# Patient Record
Sex: Male | Born: 1955 | Race: Black or African American | Hispanic: No | Marital: Married | Smoking: Never smoker
Health system: Southern US, Community
[De-identification: ages and names within clinical notes are randomized; demographics above are authoritative.]

## PROBLEM LIST (undated history)

## (undated) DIAGNOSIS — G709 Myoneural disorder, unspecified: Secondary | ICD-10-CM

## (undated) DIAGNOSIS — K210 Gastro-esophageal reflux disease with esophagitis: Secondary | ICD-10-CM

## (undated) DIAGNOSIS — E119 Type 2 diabetes mellitus without complications: Secondary | ICD-10-CM

## (undated) DIAGNOSIS — M1990 Unspecified osteoarthritis, unspecified site: Secondary | ICD-10-CM

## (undated) DIAGNOSIS — E7800 Pure hypercholesterolemia, unspecified: Secondary | ICD-10-CM

## (undated) DIAGNOSIS — Z5189 Encounter for other specified aftercare: Secondary | ICD-10-CM

## (undated) DIAGNOSIS — G629 Polyneuropathy, unspecified: Secondary | ICD-10-CM

## (undated) DIAGNOSIS — E876 Hypokalemia: Secondary | ICD-10-CM

## (undated) DIAGNOSIS — D509 Iron deficiency anemia, unspecified: Secondary | ICD-10-CM

## (undated) DIAGNOSIS — N259 Disorder resulting from impaired renal tubular function, unspecified: Secondary | ICD-10-CM

## (undated) DIAGNOSIS — I10 Essential (primary) hypertension: Secondary | ICD-10-CM

## (undated) DIAGNOSIS — F1020 Alcohol dependence, uncomplicated: Secondary | ICD-10-CM

## (undated) DIAGNOSIS — K219 Gastro-esophageal reflux disease without esophagitis: Secondary | ICD-10-CM

## (undated) DIAGNOSIS — D72819 Decreased white blood cell count, unspecified: Secondary | ICD-10-CM

## (undated) DIAGNOSIS — E291 Testicular hypofunction: Secondary | ICD-10-CM

## (undated) HISTORY — DX: Unspecified osteoarthritis, unspecified site: M19.90

## (undated) HISTORY — DX: Iron deficiency anemia, unspecified: D50.9

## (undated) HISTORY — DX: Testicular hypofunction: E29.1

## (undated) HISTORY — DX: Decreased white blood cell count, unspecified: D72.819

## (undated) HISTORY — DX: Disorder resulting from impaired renal tubular function, unspecified: N25.9

## (undated) HISTORY — DX: Type 2 diabetes mellitus without complications: E11.9

## (undated) HISTORY — DX: Alcohol dependence, uncomplicated: F10.20

## (undated) HISTORY — DX: Pure hypercholesterolemia, unspecified: E78.00

## (undated) HISTORY — PX: APPENDECTOMY: SHX54

## (undated) HISTORY — DX: Polyneuropathy, unspecified: G62.9

## (undated) HISTORY — DX: Gastro-esophageal reflux disease without esophagitis: K21.9

## (undated) HISTORY — DX: Myoneural disorder, unspecified: G70.9

## (undated) HISTORY — DX: Encounter for other specified aftercare: Z51.89

## (undated) HISTORY — PX: UPPER GASTROINTESTINAL ENDOSCOPY: SHX188

## (undated) HISTORY — DX: Hypokalemia: E87.6

## (undated) HISTORY — PX: COLONOSCOPY: SHX174

## (undated) HISTORY — DX: Gastro-esophageal reflux disease with esophagitis: K21.0

## (undated) HISTORY — DX: Essential (primary) hypertension: I10

---

## 1998-05-26 ENCOUNTER — Encounter: Payer: Self-pay | Admitting: Emergency Medicine

## 1998-05-26 ENCOUNTER — Inpatient Hospital Stay (HOSPITAL_COMMUNITY): Admission: EM | Admit: 1998-05-26 | Discharge: 1998-05-29 | Payer: Self-pay | Admitting: Emergency Medicine

## 1998-05-28 ENCOUNTER — Encounter: Payer: Self-pay | Admitting: Gastroenterology

## 1999-01-19 ENCOUNTER — Ambulatory Visit (HOSPITAL_COMMUNITY): Admission: RE | Admit: 1999-01-19 | Discharge: 1999-01-19 | Payer: Self-pay | Admitting: Gastroenterology

## 2001-11-01 ENCOUNTER — Encounter: Admission: RE | Admit: 2001-11-01 | Discharge: 2001-11-01 | Payer: Self-pay | Admitting: Internal Medicine

## 2001-11-01 ENCOUNTER — Encounter: Payer: Self-pay | Admitting: Internal Medicine

## 2002-05-18 ENCOUNTER — Inpatient Hospital Stay (HOSPITAL_COMMUNITY): Admission: EM | Admit: 2002-05-18 | Discharge: 2002-05-21 | Payer: Self-pay | Admitting: Emergency Medicine

## 2002-07-18 ENCOUNTER — Inpatient Hospital Stay (HOSPITAL_COMMUNITY): Admission: AD | Admit: 2002-07-18 | Discharge: 2002-07-24 | Payer: Self-pay | Admitting: Internal Medicine

## 2002-07-19 ENCOUNTER — Encounter (INDEPENDENT_AMBULATORY_CARE_PROVIDER_SITE_OTHER): Payer: Self-pay | Admitting: *Deleted

## 2002-08-29 ENCOUNTER — Inpatient Hospital Stay (HOSPITAL_COMMUNITY): Admission: EM | Admit: 2002-08-29 | Discharge: 2002-08-31 | Payer: Self-pay | Admitting: Emergency Medicine

## 2002-08-29 ENCOUNTER — Encounter: Payer: Self-pay | Admitting: Emergency Medicine

## 2002-08-31 ENCOUNTER — Encounter (INDEPENDENT_AMBULATORY_CARE_PROVIDER_SITE_OTHER): Payer: Self-pay | Admitting: Specialist

## 2002-09-23 ENCOUNTER — Emergency Department (HOSPITAL_COMMUNITY): Admission: EM | Admit: 2002-09-23 | Discharge: 2002-09-23 | Payer: Self-pay | Admitting: Emergency Medicine

## 2002-11-01 ENCOUNTER — Encounter
Admission: RE | Admit: 2002-11-01 | Discharge: 2003-01-30 | Payer: Self-pay | Admitting: Physical Medicine & Rehabilitation

## 2003-03-12 ENCOUNTER — Encounter
Admission: RE | Admit: 2003-03-12 | Discharge: 2003-06-10 | Payer: Self-pay | Admitting: Physical Medicine & Rehabilitation

## 2003-12-21 ENCOUNTER — Emergency Department (HOSPITAL_COMMUNITY): Admission: EM | Admit: 2003-12-21 | Discharge: 2003-12-21 | Payer: Self-pay | Admitting: Emergency Medicine

## 2004-02-27 ENCOUNTER — Ambulatory Visit: Payer: Self-pay | Admitting: Endocrinology

## 2004-08-19 ENCOUNTER — Inpatient Hospital Stay (HOSPITAL_COMMUNITY): Admission: EM | Admit: 2004-08-19 | Discharge: 2004-08-21 | Payer: Self-pay | Admitting: Emergency Medicine

## 2004-08-19 ENCOUNTER — Ambulatory Visit: Payer: Self-pay | Admitting: Internal Medicine

## 2004-08-20 HISTORY — PX: ESOPHAGOGASTRODUODENOSCOPY: SHX1529

## 2004-08-21 HISTORY — PX: COLONOSCOPY: SHX174

## 2004-08-27 ENCOUNTER — Ambulatory Visit: Payer: Self-pay | Admitting: Endocrinology

## 2004-09-09 ENCOUNTER — Emergency Department (HOSPITAL_COMMUNITY): Admission: EM | Admit: 2004-09-09 | Discharge: 2004-09-10 | Payer: Self-pay | Admitting: Emergency Medicine

## 2004-09-10 ENCOUNTER — Ambulatory Visit: Payer: Self-pay | Admitting: Psychiatry

## 2004-09-10 ENCOUNTER — Inpatient Hospital Stay (HOSPITAL_COMMUNITY): Admission: EM | Admit: 2004-09-10 | Discharge: 2004-09-14 | Payer: Self-pay | Admitting: Psychiatry

## 2004-09-15 ENCOUNTER — Encounter: Admission: RE | Admit: 2004-09-15 | Discharge: 2004-12-14 | Payer: Self-pay | Admitting: Endocrinology

## 2004-09-16 ENCOUNTER — Ambulatory Visit: Payer: Self-pay | Admitting: Endocrinology

## 2004-09-18 ENCOUNTER — Ambulatory Visit: Payer: Self-pay | Admitting: Endocrinology

## 2004-11-24 ENCOUNTER — Ambulatory Visit: Payer: Self-pay | Admitting: Endocrinology

## 2004-11-27 ENCOUNTER — Ambulatory Visit: Payer: Self-pay | Admitting: Endocrinology

## 2005-01-07 ENCOUNTER — Encounter: Admission: RE | Admit: 2005-01-07 | Discharge: 2005-04-07 | Payer: Self-pay | Admitting: Endocrinology

## 2005-02-02 ENCOUNTER — Ambulatory Visit: Payer: Self-pay | Admitting: Endocrinology

## 2005-02-05 ENCOUNTER — Ambulatory Visit: Payer: Self-pay | Admitting: Endocrinology

## 2005-08-30 ENCOUNTER — Encounter: Admission: RE | Admit: 2005-08-30 | Discharge: 2005-08-30 | Payer: Self-pay | Admitting: Endocrinology

## 2005-10-06 ENCOUNTER — Ambulatory Visit: Payer: Self-pay | Admitting: Endocrinology

## 2005-10-11 ENCOUNTER — Ambulatory Visit: Payer: Self-pay | Admitting: Endocrinology

## 2006-02-17 ENCOUNTER — Encounter: Admission: RE | Admit: 2006-02-17 | Discharge: 2006-05-18 | Payer: Self-pay | Admitting: Endocrinology

## 2006-03-06 ENCOUNTER — Emergency Department (HOSPITAL_COMMUNITY): Admission: EM | Admit: 2006-03-06 | Discharge: 2006-03-07 | Payer: Self-pay | Admitting: Emergency Medicine

## 2006-03-08 ENCOUNTER — Ambulatory Visit: Payer: Self-pay | Admitting: Endocrinology

## 2006-03-08 ENCOUNTER — Inpatient Hospital Stay (HOSPITAL_COMMUNITY): Admission: EM | Admit: 2006-03-08 | Discharge: 2006-03-09 | Payer: Self-pay | Admitting: Endocrinology

## 2006-03-09 ENCOUNTER — Ambulatory Visit: Payer: Self-pay | Admitting: Internal Medicine

## 2006-03-11 ENCOUNTER — Ambulatory Visit: Payer: Self-pay | Admitting: Endocrinology

## 2006-07-06 ENCOUNTER — Ambulatory Visit: Payer: Self-pay | Admitting: Endocrinology

## 2006-09-29 ENCOUNTER — Encounter: Admission: RE | Admit: 2006-09-29 | Discharge: 2006-09-29 | Payer: Self-pay | Admitting: Endocrinology

## 2006-10-24 ENCOUNTER — Ambulatory Visit: Payer: Self-pay | Admitting: Endocrinology

## 2006-10-24 LAB — CONVERTED CEMR LAB
ALT: 23 units/L (ref 0–53)
Alkaline Phosphatase: 37 units/L — ABNORMAL LOW (ref 39–117)
BUN: 16 mg/dL (ref 6–23)
Basophils Absolute: 0 10*3/uL (ref 0.0–0.1)
Calcium: 9.3 mg/dL (ref 8.4–10.5)
Chloride: 103 meq/L (ref 96–112)
Creatinine, Ser: 1.2 mg/dL (ref 0.4–1.5)
Eosinophils Relative: 4.2 % (ref 0.0–5.0)
HCT: 40.9 % (ref 39.0–52.0)
Hemoglobin: 13.7 g/dL (ref 13.0–17.0)
Lymphocytes Relative: 36.7 % (ref 12.0–46.0)
MCHC: 33.5 g/dL (ref 30.0–36.0)
Monocytes Absolute: 0.5 10*3/uL (ref 0.2–0.7)
Neutro Abs: 1.9 10*3/uL (ref 1.4–7.7)
Platelets: 203 10*3/uL (ref 150–400)
RBC: 4.53 M/uL (ref 4.22–5.81)
RDW: 12.5 % (ref 11.5–14.6)
Sodium: 140 meq/L (ref 135–145)
Total Bilirubin: 0.7 mg/dL (ref 0.3–1.2)
Total Protein: 7 g/dL (ref 6.0–8.3)

## 2006-10-31 ENCOUNTER — Ambulatory Visit: Payer: Self-pay | Admitting: Endocrinology

## 2006-11-07 HISTORY — PX: ELECTROCARDIOGRAM: SHX264

## 2006-11-10 ENCOUNTER — Encounter: Payer: Self-pay | Admitting: Endocrinology

## 2006-11-10 DIAGNOSIS — N259 Disorder resulting from impaired renal tubular function, unspecified: Secondary | ICD-10-CM | POA: Insufficient documentation

## 2006-11-10 DIAGNOSIS — I10 Essential (primary) hypertension: Secondary | ICD-10-CM

## 2006-11-10 DIAGNOSIS — E119 Type 2 diabetes mellitus without complications: Secondary | ICD-10-CM

## 2006-11-10 HISTORY — DX: Disorder resulting from impaired renal tubular function, unspecified: N25.9

## 2006-11-10 HISTORY — DX: Essential (primary) hypertension: I10

## 2006-11-10 HISTORY — DX: Type 2 diabetes mellitus without complications: E11.9

## 2007-04-15 ENCOUNTER — Ambulatory Visit: Payer: Self-pay | Admitting: Internal Medicine

## 2007-04-15 ENCOUNTER — Inpatient Hospital Stay (HOSPITAL_COMMUNITY): Admission: EM | Admit: 2007-04-15 | Discharge: 2007-04-17 | Payer: Self-pay | Admitting: Emergency Medicine

## 2007-04-17 ENCOUNTER — Telehealth (INDEPENDENT_AMBULATORY_CARE_PROVIDER_SITE_OTHER): Payer: Self-pay | Admitting: *Deleted

## 2007-04-25 ENCOUNTER — Telehealth: Payer: Self-pay | Admitting: Endocrinology

## 2007-05-16 ENCOUNTER — Encounter: Payer: Self-pay | Admitting: Endocrinology

## 2007-07-17 ENCOUNTER — Telehealth (INDEPENDENT_AMBULATORY_CARE_PROVIDER_SITE_OTHER): Payer: Self-pay | Admitting: *Deleted

## 2007-08-03 ENCOUNTER — Encounter: Payer: Self-pay | Admitting: Endocrinology

## 2007-12-14 ENCOUNTER — Ambulatory Visit: Payer: Self-pay | Admitting: Endocrinology

## 2007-12-17 LAB — CONVERTED CEMR LAB
Alkaline Phosphatase: 36 units/L — ABNORMAL LOW (ref 39–117)
Basophils Relative: 0.6 % (ref 0.0–3.0)
Bilirubin, Direct: 0.1 mg/dL (ref 0.0–0.3)
Calcium: 9.6 mg/dL (ref 8.4–10.5)
Cholesterol: 105 mg/dL (ref 0–200)
Eosinophils Absolute: 0.2 10*3/uL (ref 0.0–0.7)
HCT: 37.8 % — ABNORMAL LOW (ref 39.0–52.0)
HDL: 49.9 mg/dL (ref >39.0)
Hemoglobin: 12.6 g/dL — ABNORMAL LOW (ref 13.0–17.0)
LDL Cholesterol: 42 mg/dL (ref 0–99)
Leukocytes, UA: NEGATIVE
Lymphocytes Relative: 35.7 % (ref 12.0–46.0)
MCV: 91.5 fL (ref 78.0–100.0)
Neutro Abs: 1.7 10*3/uL (ref 1.4–7.7)
Nitrite: NEGATIVE
Potassium: 4.2 meq/L (ref 3.5–5.1)
RBC: 4.13 M/uL — ABNORMAL LOW (ref 4.22–5.81)
Sodium: 140 meq/L (ref 135–145)
Total Bilirubin: 0.7 mg/dL (ref 0.3–1.2)
Total Protein: 7.5 g/dL (ref 6.0–8.3)
Urine Glucose: NEGATIVE mg/dL
VLDL: 13 mg/dL (ref 0–40)
WBC: 3.6 10*3/uL — ABNORMAL LOW (ref 4.5–10.5)
pH: 7 (ref 5.0–8.0)

## 2007-12-22 ENCOUNTER — Ambulatory Visit: Payer: Self-pay | Admitting: Endocrinology

## 2007-12-22 DIAGNOSIS — E291 Testicular hypofunction: Secondary | ICD-10-CM | POA: Insufficient documentation

## 2007-12-22 DIAGNOSIS — K2100 Gastro-esophageal reflux disease with esophagitis, without bleeding: Secondary | ICD-10-CM

## 2007-12-22 DIAGNOSIS — M79606 Pain in leg, unspecified: Secondary | ICD-10-CM | POA: Insufficient documentation

## 2007-12-22 DIAGNOSIS — F1020 Alcohol dependence, uncomplicated: Secondary | ICD-10-CM

## 2007-12-22 DIAGNOSIS — E7800 Pure hypercholesterolemia, unspecified: Secondary | ICD-10-CM | POA: Insufficient documentation

## 2007-12-22 HISTORY — DX: Testicular hypofunction: E29.1

## 2007-12-22 HISTORY — DX: Alcohol dependence, uncomplicated: F10.20

## 2007-12-22 HISTORY — DX: Gastro-esophageal reflux disease with esophagitis, without bleeding: K21.00

## 2007-12-22 HISTORY — DX: Pure hypercholesterolemia, unspecified: E78.00

## 2007-12-26 ENCOUNTER — Encounter: Payer: Self-pay | Admitting: Endocrinology

## 2007-12-26 DIAGNOSIS — D509 Iron deficiency anemia, unspecified: Secondary | ICD-10-CM

## 2007-12-26 HISTORY — DX: Iron deficiency anemia, unspecified: D50.9

## 2008-03-28 ENCOUNTER — Telehealth (INDEPENDENT_AMBULATORY_CARE_PROVIDER_SITE_OTHER): Payer: Self-pay | Admitting: *Deleted

## 2008-04-26 ENCOUNTER — Telehealth (INDEPENDENT_AMBULATORY_CARE_PROVIDER_SITE_OTHER): Payer: Self-pay | Admitting: *Deleted

## 2008-05-28 ENCOUNTER — Emergency Department (HOSPITAL_COMMUNITY): Admission: EM | Admit: 2008-05-28 | Discharge: 2008-05-28 | Payer: Self-pay | Admitting: Emergency Medicine

## 2008-05-28 ENCOUNTER — Ambulatory Visit: Payer: Self-pay | Admitting: *Deleted

## 2008-05-28 ENCOUNTER — Inpatient Hospital Stay (HOSPITAL_COMMUNITY): Admission: RE | Admit: 2008-05-28 | Discharge: 2008-05-31 | Payer: Self-pay | Admitting: *Deleted

## 2008-06-06 ENCOUNTER — Ambulatory Visit: Payer: Self-pay | Admitting: Endocrinology

## 2008-06-06 DIAGNOSIS — D61818 Other pancytopenia: Secondary | ICD-10-CM | POA: Insufficient documentation

## 2008-06-06 DIAGNOSIS — E876 Hypokalemia: Secondary | ICD-10-CM

## 2008-06-06 HISTORY — DX: Hypokalemia: E87.6

## 2008-06-06 LAB — CONVERTED CEMR LAB
BUN: 7 mg/dL (ref 6–23)
Calcium: 9.2 mg/dL (ref 8.4–10.5)
Chloride: 105 meq/L (ref 96–112)
Creatinine, Ser: 1.2 mg/dL (ref 0.4–1.5)
Eosinophils Absolute: 0.2 10*3/uL (ref 0.0–0.7)
GFR calc Af Amer: 81 mL/min
GFR calc non Af Amer: 67 mL/min
Hemoglobin: 11 g/dL — ABNORMAL LOW (ref 13.0–17.0)
Iron: 230 ug/dL — ABNORMAL HIGH (ref 42–165)
Lymphocytes Relative: 29.2 % (ref 12.0–46.0)
MCHC: 32.7 g/dL (ref 30.0–36.0)
Monocytes Absolute: 0.4 10*3/uL (ref 0.1–1.0)
Neutrophils Relative %: 54.3 % (ref 43.0–77.0)
RBC: 3.61 M/uL — ABNORMAL LOW (ref 4.22–5.81)
RDW: 14 % (ref 11.5–14.6)
Sodium: 144 meq/L (ref 135–145)

## 2008-06-14 ENCOUNTER — Encounter: Payer: Self-pay | Admitting: Endocrinology

## 2008-07-08 ENCOUNTER — Encounter: Payer: Self-pay | Admitting: Endocrinology

## 2008-09-02 ENCOUNTER — Encounter: Payer: Self-pay | Admitting: Endocrinology

## 2008-11-13 ENCOUNTER — Telehealth: Payer: Self-pay | Admitting: Endocrinology

## 2009-01-02 ENCOUNTER — Ambulatory Visit: Payer: Self-pay | Admitting: Endocrinology

## 2009-01-03 LAB — CONVERTED CEMR LAB
ALT: 25 units/L (ref 0–53)
AST: 18 units/L (ref 0–37)
Albumin: 4 g/dL (ref 3.5–5.2)
Alkaline Phosphatase: 45 units/L (ref 39–117)
Basophils Absolute: 0 10*3/uL (ref 0.0–0.1)
Basophils Relative: 0.5 % (ref 0.0–3.0)
Creatinine,U: 68.1 mg/dL
Eosinophils Absolute: 0.3 10*3/uL (ref 0.0–0.7)
Eosinophils Relative: 7.1 % — ABNORMAL HIGH (ref 0.0–5.0)
GFR calc non Af Amer: 73.71 mL/min (ref >60)
Ketones, ur: NEGATIVE mg/dL
Leukocytes, UA: NEGATIVE
Lymphocytes Relative: 40.6 % (ref 12.0–46.0)
MCHC: 31.8 g/dL (ref 30.0–36.0)
Microalb Creat Ratio: 2.9 mg/g (ref 0.0–30.0)
Microalb, Ur: 0.2 mg/dL (ref 0.0–1.9)
Monocytes Relative: 11.1 % (ref 3.0–12.0)
Neutrophils Relative %: 40.7 % — ABNORMAL LOW (ref 43.0–77.0)
Nitrite: NEGATIVE
PSA: 0.81 ng/mL (ref 0.10–4.00)
Platelets: 125 10*3/uL — ABNORMAL LOW (ref 150.0–400.0)
Potassium: 3.6 meq/L (ref 3.5–5.1)
RDW: 11.9 % (ref 11.5–14.6)
Specific Gravity, Urine: 1.01 (ref 1.000–1.030)
Total Protein: 7 g/dL (ref 6.0–8.3)
VLDL: 12.6 mg/dL (ref 0.0–40.0)
pH: 7.5 (ref 5.0–8.0)

## 2009-01-06 ENCOUNTER — Ambulatory Visit: Payer: Self-pay | Admitting: Endocrinology

## 2009-01-07 ENCOUNTER — Telehealth: Payer: Self-pay | Admitting: Endocrinology

## 2009-04-12 HISTORY — PX: REPLACEMENT TOTAL KNEE: SUR1224

## 2009-04-14 ENCOUNTER — Encounter: Payer: Self-pay | Admitting: Endocrinology

## 2009-05-09 ENCOUNTER — Encounter: Payer: Self-pay | Admitting: Endocrinology

## 2009-05-09 ENCOUNTER — Ambulatory Visit: Payer: Self-pay | Admitting: Internal Medicine

## 2009-05-09 LAB — CONVERTED CEMR LAB
Basophils Absolute: 0 10*3/uL (ref 0.0–0.1)
Eosinophils Absolute: 0.2 10*3/uL (ref 0.0–0.7)
HCT: 41.9 % (ref 39.0–52.0)
Hgb A1c MFr Bld: 7 % — ABNORMAL HIGH (ref 4.6–6.5)
LH: 8.96 milliintl units/mL (ref 1.50–9.30)
MCV: 93.3 fL (ref 78.0–100.0)
Neutro Abs: 1.6 10*3/uL (ref 1.4–7.7)
Platelets: 137 10*3/uL — ABNORMAL LOW (ref 150.0–400.0)
Prolactin: 7.8 ng/mL
RDW: 12 % (ref 11.5–14.6)
Saturation Ratios: 21.9 % (ref 20.0–50.0)
Transferrin: 195.5 mg/dL — ABNORMAL LOW (ref 212.0–360.0)

## 2009-05-13 ENCOUNTER — Telehealth (INDEPENDENT_AMBULATORY_CARE_PROVIDER_SITE_OTHER): Payer: Self-pay | Admitting: *Deleted

## 2009-05-21 ENCOUNTER — Encounter: Payer: Self-pay | Admitting: Endocrinology

## 2009-05-28 ENCOUNTER — Telehealth (INDEPENDENT_AMBULATORY_CARE_PROVIDER_SITE_OTHER): Payer: Self-pay | Admitting: *Deleted

## 2009-06-03 ENCOUNTER — Inpatient Hospital Stay (HOSPITAL_COMMUNITY): Admission: RE | Admit: 2009-06-03 | Discharge: 2009-06-06 | Payer: Self-pay | Admitting: Orthopedic Surgery

## 2009-06-03 ENCOUNTER — Other Ambulatory Visit: Payer: Self-pay | Admitting: Orthopedic Surgery

## 2009-06-26 ENCOUNTER — Telehealth: Payer: Self-pay | Admitting: Endocrinology

## 2009-08-28 ENCOUNTER — Telehealth: Payer: Self-pay | Admitting: Endocrinology

## 2009-09-22 ENCOUNTER — Encounter: Payer: Self-pay | Admitting: Endocrinology

## 2009-11-26 ENCOUNTER — Ambulatory Visit: Payer: Self-pay | Admitting: Endocrinology

## 2009-11-27 LAB — CONVERTED CEMR LAB
ALT: 26 units/L (ref 0–53)
AST: 22 units/L (ref 0–37)
Albumin: 4.3 g/dL (ref 3.5–5.2)
Alkaline Phosphatase: 43 units/L (ref 39–117)
Calcium: 9.7 mg/dL (ref 8.4–10.5)
Cholesterol: 121 mg/dL (ref 0–200)
Eosinophils Absolute: 0.2 10*3/uL (ref 0.0–0.7)
Eosinophils Relative: 5.7 % — ABNORMAL HIGH (ref 0.0–5.0)
GFR calc non Af Amer: 76.17 mL/min (ref >60)
Glucose, Bld: 103 mg/dL — ABNORMAL HIGH (ref 70–99)
HDL: 35.8 mg/dL — ABNORMAL LOW (ref >39.00)
Lymphocytes Relative: 45.2 % (ref 12.0–46.0)
MCV: 85.5 fL (ref 78.0–100.0)
Monocytes Absolute: 0.4 10*3/uL (ref 0.1–1.0)
Monocytes Relative: 11.1 % (ref 3.0–12.0)
Nitrite: NEGATIVE
PSA: 0.68 ng/mL (ref 0.10–4.00)
RDW: 14.9 % — ABNORMAL HIGH (ref 11.5–14.6)
Sodium: 141 meq/L (ref 135–145)
TSH: 1.21 microintl units/mL (ref 0.35–5.50)
Total Protein, Urine: NEGATIVE mg/dL
Triglycerides: 69 mg/dL (ref 0.0–149.0)
Urobilinogen, UA: 0.2 (ref 0.0–1.0)

## 2009-12-02 ENCOUNTER — Encounter: Payer: Self-pay | Admitting: Endocrinology

## 2009-12-02 ENCOUNTER — Ambulatory Visit: Payer: Self-pay | Admitting: Endocrinology

## 2010-01-15 ENCOUNTER — Emergency Department (HOSPITAL_COMMUNITY): Admission: EM | Admit: 2010-01-15 | Discharge: 2010-01-15 | Payer: Self-pay | Admitting: Emergency Medicine

## 2010-02-24 ENCOUNTER — Ambulatory Visit: Payer: Self-pay | Admitting: Endocrinology

## 2010-02-24 LAB — CONVERTED CEMR LAB: Hgb A1c MFr Bld: 7.3 % — ABNORMAL HIGH (ref 4.6–6.5)

## 2010-04-28 ENCOUNTER — Telehealth: Payer: Self-pay | Admitting: Endocrinology

## 2010-04-29 ENCOUNTER — Encounter: Payer: Self-pay | Admitting: Endocrinology

## 2010-04-29 ENCOUNTER — Telehealth (INDEPENDENT_AMBULATORY_CARE_PROVIDER_SITE_OTHER): Payer: Self-pay | Admitting: *Deleted

## 2010-05-10 LAB — CONVERTED CEMR LAB
Folate: >20 ng/mL
Folate: >20 ng/mL
Hgb A1c MFr Bld: 6.3 % — ABNORMAL HIGH (ref 4.6–6.0)
Iron: 53 ug/dL (ref 42–165)
Saturation Ratios: 14.4 % — ABNORMAL LOW (ref 20.0–50.0)
Testosterone: 173.96 ng/dL — ABNORMAL LOW (ref 350.00–890)
Transferrin: 262.2 mg/dL (ref >=212.0)
Vitamin B-12: 780 pg/mL (ref 211–911)

## 2010-05-12 ENCOUNTER — Telehealth: Payer: Self-pay | Admitting: Endocrinology

## 2010-05-12 NOTE — Letter (Signed)
Summary: Clearance/[facility]  Clearance/[facility]   Imported By: Lester Argyle 05/22/2009 10:23:47  _____________________________________________________________________  External Attachment:    Type:   Image     Comment:   External Document

## 2010-05-12 NOTE — Assessment & Plan Note (Signed)
Summary: PHYSICAL--STC   Vital Signs:  Patient profile:   55 year old male Height:      71 inches (180.34 cm) Weight:      300.13 pounds (136.42 kg) BMI:     42.01 O2 Sat:      98 % on Room air Temp:     97.5 degrees F (36.39 degrees C) oral Pulse rate:   76 / minute BP sitting:   126 / 80  (left arm) Cuff size:   large  Vitals Entered By: Brenton Grills MA (December 02, 2009 8:34 AM)  O2 Flow:  Room air CC: Physical/aj Is Patient Diabetic? Yes   CC:  Physical/aj.  History of Present Illness: here for regular wellness examination.  He's feeling pretty well in general, and does not drink or smoke.   Current Medications (verified): 1)  Neurontin 300 Mg  Caps (Gabapentin) .... Take 2 Tid 2)  Klor-Con M20 20 Meq  Tbcr (Potassium Chloride Crys Cr) .... Take 1 By Mouth Qd 3)  Trazodone Hcl 50 Mg  Tabs (Trazodone Hcl) .... Take 1 At Bedtime 4)  Fish Oil 1000 Mg  Caps (Omega-3 Fatty Acids) .... Take 3 By Mouth Qd 5)  Viagra 100 Mg  Tabs (Sildenafil Citrate) .... Use Prn 6)  Eql Omeprazole 20 Mg  Tbec (Omeprazole) .... Qd 7)  Omeprazole 20 Mg Cpdr (Omeprazole) .... Take 1 Capsule By Mouth Once A Day 8)  Zestoretic 10-12.5 Mg Tabs (Lisinopril-Hydrochlorothiazide) .... Qd 9)  Onetouch Ultra Test  Strp (Glucose Blood) .... Check Blood Sugars Two Times A Day 10)  Meloxicam 15 Mg Tabs (Meloxicam) .Marland Kitchen.. 1 Daily 11)  Hydrocodone/apap 325 Mg .Marland Kitchen.. 1 Tab By Mouth 2 Daily 12)  Simvastatin 40 Mg Tabs (Simvastatin) .Marland Kitchen.. 1 Qhs 13)  Metformin Hcl 500 Mg Xr24h-Tab (Metformin Hcl) .... 2 Tabs Two Times A Day  Allergies (verified): 1)  ! Actos (Pioglitazone Hcl)  Family History: Reviewed history from 12/22/2007 and no changes required. mother had colon cancer  Social History: Reviewed history from 12/22/2007 and no changes required. married works for [employer] stopped alcohol jan 2010 nonsmoker   Review of Systems  The patient denies fever, weight loss, weight gain, vision loss,  decreased hearing, chest pain, syncope, dyspnea on exertion, prolonged cough, headaches, abdominal pain, melena, hematochezia, severe indigestion/heartburn, hematuria, suspicious skin lesions, and depression.    Physical Exam  General:  normal appearance.   Head:  head: no deformity eyes: no periorbital swelling, no proptosis external nose and ears are normal mouth: no lesion seen Neck:  Supple without thyroid enlargement or tenderness.  Lungs:  Clear to auscultation bilaterally. Normal respiratory effort.  Heart:  Regular rate and rhythm without murmurs or gallops noted. Normal S1,S2.   Abdomen:  abdomen is soft, nontender.  no hepatosplenomegaly.   not distended.  no hernia  Rectal:  normal external and internal exam.  heme neg  Prostate:  Normal size prostate without masses or tenderness.  Msk:  there is a scar (tkr) at the right anterior knee Pulses:  dorsalis pedis intact bilat.  no carotid bruit Neurologic:  cn 2-12 grossly intact.   readily moves all 4's.   sensation is intact to touch on the feet  Skin:  normal texture and temp.  no rash.  not diaphoretic  Cervical Nodes:  No significant adenopathy.  Psych:  Alert and cooperative; normal mood and affect; normal attention span and concentration.   Additional Exam:  SEPARATE EVALUATION FOLLOWS--EACH PROBLEM HERE IS NEW,  NOT RESPONDING TO TREATMENT, OR POSES SIGNIFICANT RISK TO THE PATIENT'S HEALTH: HISTORY OF THE PRESENT ILLNESS: pt went-off his testosterone cream no cbg record, but states cbg's are well-controlled PAST MEDICAL HISTORY reviewed and up to date today REVIEW OF SYSTEMS: denies hypoglycemia PHYSICAL EXAMINATION: muscle bulk and strength are grossly normal.  no obvious joint swelling.  gait is normal and steady breasts:  there is bilat pseudogynecomastia no deformity.  no ulcer on the feet.  feet are of normal color and temp.  there is 1+ bilat edema. there is bilat onychomycosis LAB/XRAY RESULTS: Hemoglobin  A1C       [H]  7.6 %  testosterone=166 IMPRESSION: dm, needs increased rx hypogonadism, needs increased rx PLAN: see instruction sheet   Impression & Recommendations:  Problem # 1:  ROUTINE GENERAL MEDICAL EXAM@HEALTH  CARE FACL (ICD-V70.0)  Medications Added to Medication List This Visit: 1)  Testim 1 % Gel (Testosterone) .... 5 grams once daily 2)  Bromocriptine Mesylate 2.5 Mg Tabs (Bromocriptine mesylate) .... 1/2 tab at bedtime  Other Orders: EKG w/ Interpretation (93000) Est. Patient Level III (86578) Est. Patient 40-64 years (46962)  Patient Instructions: 1)  resume testim 5 grams once daily 2)  good diet and exercise habits significanly improve the control of your diabetes.  please let me know if you wish to be referred to a dietician.  high blood sugar is very risky to your health.  you should see an eye doctor every year. 3)  controlling your blood pressure and cholesterol drastically reduces the damage diabetes does to your body.  this also applies to quitting smoking.  please discuss these with your doctor.  you should take an aspirin every day, unless you have been advised by a doctor not to. 4)  add bromocriptine 1/2 of 2.5 mg at bedtime. 5)  please consider these measures for your health:  minimize alcohol.  do not use tobacco products.  have a colonoscopy at least every 10 years from age 17.  keep firearms safely stored.  always use seat belts.  have working smoke alarms in your home.  see an eye doctor and dentist regularly.  never drive under the influence of alcohol or drugs (including prescription drugs). 6)  check your blood sugar 1 time a day.  vary the time of day when you check, between before the 3 meals, and at bedtime.  also check if you have symptoms of your blood sugar being too high or too low.  please keep a record of the readings and bring it to your next appointment here.  please call us sooner if you are having low blood sugar episodes. 7)  Please  schedule a follow-up appointment in 3 months. Prescriptions: BROMOCRIPTINE MESYLATE 2.5 MG TABS (BROMOCRIPTINE MESYLATE) 1/2 tab at bedtime  #45 x 3   Entered and Authorized by:   Minus Breeding MD   Signed by:   Minus Breeding MD on 12/02/2009   Method used:   Electronically to        [facility] [address] ([facility]order)       [facility]       [address]       [place], [place]  [address]       Ph: [phone]       Fax: [phone]   RxID:   [id number] VIAGRA 100 MG  TABS (SILDENAFIL CITRATE) use prn  #18 x 3   Entered and Authorized by:   Minus Breeding MD  Signed by:   Minus Breeding MD on 12/02/2009   Method used:   Print then Give to Patient   RxID:   [id number] TESTIM 1 % GEL (TESTOSTERONE) 5 grams once daily  #90 doses x 1   Entered and Authorized by:   Minus Breeding MD   Signed by:   Minus Breeding MD on 12/02/2009   Method used:   Print then Give to Patient   RxID:   [id number]

## 2010-05-12 NOTE — Letter (Signed)
Summary: Dustin Folks Bernstorf OD  Dustin Folks Bernstorf OD   Imported By: Lester Berthold 10/02/2009 10:04:36  _____________________________________________________________________  External Attachment:    Type:   Image     Comment:   External Document

## 2010-05-12 NOTE — Progress Notes (Signed)
Summary: Rx change  Phone Note Call from Patient Call back at Home Phone [phone]   Caller: Patient Summary of Call: pt called stating that Metformin is causing CBG to drop to quickly. Pt is requesting prescription be changed to Metformin ER.  [facility] Initial call taken by: Margaret Pyle, CMA,  Aug 28, 2009 9:57 AM  Follow-up for Phone Call        i sent rx  Follow-up by: Minus Breeding MD,  Aug 28, 2009 10:34 AM  Additional Follow-up for Phone Call Additional follow up Details #1::        pt informed, Rx [facility] Additional Follow-up by: Margaret Pyle, CMA,  Aug 28, 2009 10:40 AM    New/Updated Medications: METFORMIN HCL 500 MG XR24H-TAB (METFORMIN HCL) 2 tabs two times a day Prescriptions: METFORMIN HCL 500 MG XR24H-TAB (METFORMIN HCL) 2 tabs two times a day  #360 x 3   Entered by:   Margaret Pyle, CMA   Authorized by:   Minus Breeding MD   Signed by:   Margaret Pyle, CMA on 08/28/2009   Method used:   Electronically to        [facility] [address] (mail-order)       [facility]       [address]       [place], [place]  [address]       Ph: [phone]       Fax: [phone]   RxID:   [id number] METFORMIN HCL 500 MG XR24H-TAB (METFORMIN HCL) 2 tabs two times a day  #120 x 11   Entered and Authorized by:   Minus Breeding MD   Signed by:   Minus Breeding MD on 08/28/2009   Method used:   Electronically to        [facility]* (retail)       [address] [place], [place]  [address]       Ph: [phone]       Fax: [phone]   RxID:   [id number]

## 2010-05-12 NOTE — Progress Notes (Signed)
  Phone Note From Other Clinic   Caller: Wl Pre-surgical Details for Reason: Pt.Information Initial call taken by: Km    Faxed 12 lead over to [facility] @ fax [phone] [facility]  May 28, 2009 9:37 AM

## 2010-05-12 NOTE — Letter (Signed)
Summary: [facility]  [facility]   Imported By: Sherian Rein 04/22/2009 11:51:52  _____________________________________________________________________  External Attachment:    Type:   Image     Comment:   External Document

## 2010-05-12 NOTE — Progress Notes (Signed)
Summary: RX refill  Phone Note Call from Patient Call back at Home Phone [phone]   Caller: Patient Summary of Call: pt called requesting refills of his medication to [facility] Initial call taken by: Margaret Pyle, CMA,  June 26, 2009 10:51 AM    Prescriptions: OMEPRAZOLE 20 MG CPDR (OMEPRAZOLE) Take 1 capsule by mouth once a day  #90 x 3   Entered by:   Margaret Pyle, CMA   Authorized by:   Minus Breeding MD   Signed by:   Margaret Pyle, CMA on 06/26/2009   Method used:   Electronically to        [facility] [address] (mail-order)       [facility]       [address]       [place], [place]  [address]       Ph: [phone]       Fax: [phone]   RxID:   [id number] ZESTORETIC 10-12.5 MG TABS (LISINOPRIL-HYDROCHLOROTHIAZIDE) qd  #90 x 3   Entered by:   Margaret Pyle, CMA   Authorized by:   Minus Breeding MD   Signed by:   Margaret Pyle, CMA on 06/26/2009   Method used:   Electronically to        [facility] [address] (mail-order)       [facility]       [address]       [place], [place]  [address]       Ph: [phone]       Fax: [phone]   RxID:   [id number] SIMVASTATIN 40 MG TABS (SIMVASTATIN) 1 qhs  #90 x 3   Entered by:   Margaret Pyle, CMA   Authorized by:   Minus Breeding MD   Signed by:   Margaret Pyle, CMA on 06/26/2009   Method used:   Electronically to        [facility] (mail-order)       [facility]       [address]       [place], [place]  [address]       Ph: [phone]       Fax: [phone]   RxID:   [id number] TRAZODONE HCL 50 MG  TABS (TRAZODONE HCL) take 1 at bedtime  #90 x 3   Entered by:   Margaret Pyle, CMA   Authorized by:   Minus Breeding MD   Signed by:   Margaret Pyle, CMA on 06/26/2009   Method used:   Electronically to   [facility] (mail-order)       [facility]       [address]       [place], [place]  [address]       Ph: [phone]       Fax: [phone]   RxID:   [id number] KLOR-CON M20 20 MEQ  TBCR (POTASSIUM CHLORIDE CRYS CR) take 1 by mouth qd  #90 x 3   Entered by:   Margaret Pyle, CMA   Authorized by:   Minus Breeding MD   Signed by:   Margaret Pyle, CMA on 06/26/2009   Method used:   Electronically to        [facility] (mail-order)       [facility]       [address]       [place]  [address]       Ph: [phone]       Fax: [phone]   RxID:   [id number]

## 2010-05-12 NOTE — Assessment & Plan Note (Signed)
Summary: NEEDS SURGICAL CLEARANCE / DR George Hugh PT /NWS--rs'd/cd   Vital Signs:  Patient profile:   55 year old male Height:      71 inches (180.34 cm) Weight:      296.0 pounds (134.55 kg) O2 Sat:      97 % on Room air Temp:     98.6 degrees F (37.00 degrees C) oral Pulse rate:   85 / minute BP sitting:   112 / 76  (left arm) Cuff size:   large  Vitals Entered By: Orlan Leavens (May 09, 2009 3:17 PM)  O2 Flow:  Room air CC: medical clearance for knee surgery Is Patient Diabetic? Yes Did you bring your meter with you today? No Pain Assessment Patient in pain? no        CC:  medical clearance for knee surgery.  History of Present Illness: pt will undergo right knee replacement 06/03/09.   he takes metformin as rx'ed. pt states chronic constipation, probably due to iron and vicodin. pt has been off testosterone x 4 mos. he takes zesoretic as rx'ed.  Current Medications (verified): 1)  Neurontin 300 Mg  Caps (Gabapentin) .... Take 2 Tid 2)  Klor-Con M20 20 Meq  Tbcr (Potassium Chloride Crys Cr) .... Take 1 By Mouth Qd 3)  Trazodone Hcl 50 Mg  Tabs (Trazodone Hcl) .... Take 1 At Bedtime 4)  Fish Oil 1000 Mg  Caps (Omega-3 Fatty Acids) .... Take 3 By Mouth Qd 5)  Androgel 25 Mg/2.5gm  Gel (Testosterone) .... Take 5 Grams Qd 6)  Viagra 100 Mg  Tabs (Sildenafil Citrate) .... Use Prn 7)  Eql Omeprazole 20 Mg  Tbec (Omeprazole) .... Qd 8)  Omeprazole 20 Mg Cpdr (Omeprazole) .... Take 1 Capsule By Mouth Once A Day 9)  Zestoretic 10-12.5 Mg Tabs (Lisinopril-Hydrochlorothiazide) .... Qd 10)  Onetouch Ultra Test  Strp (Glucose Blood) .... Check Blood Sugars Two Times A Day 11)  Meloxicam 15 Mg Tabs (Meloxicam) .Marland Kitchen.. 1 Daily 12)  Hydrocodone/apap 325 Mg .Marland Kitchen.. 1 Tab By Mouth 2 Daily 13)  Simvastatin 40 Mg Tabs (Simvastatin) .Marland Kitchen.. 1 Qhs 14)  Metformin Hcl 500 Mg Xr24h-Tab (Metformin Hcl) .... 4 Qam  Allergies (verified): No Known Drug Allergies  Review of Systems  The patient  denies fever, chest pain, dyspnea on exertion, hematochezia, and hematuria.         denies n/v  Physical Exam  General:  normal appearance.   Lungs:  Clear to auscultation bilaterally. Normal respiratory effort.  Heart:  Regular rate and rhythm without murmurs or gallops noted. Normal S1,S2.   Additional Exam:  ecg is normal today.  Hemoglobin A1C       [H]  7.0 %                       4.6-6.5   White Cell Count     [L]  4.3 K/uL                    4.5-10.5   Red Cell Count            4.49 Mil/uL                 4.22-5.81   Hemoglobin                13.5 g/dL                   16.1-09.6   Hematocrit  41.9 %                      39.0-52.0   Platelet Count       [L]  137.0 K/uL   Iron Saturation           21.9 %                      20.0-50.0   Testosterone         [L]  140.72 ng/dL                846.96-295.28  Leutinizing Hormone       8.96 mIU/mL                 1.50-9.30   FSH                  [H]  23.5 mIU/ML                 1.4-18.1  Prolactin                 7.8 ng/mL   Impression & Recommendations:  Problem # 1:  HYPERTENSION (ICD-401.9) well-controlled  Problem # 2:  constipation prob due to iron and narcotics  Problem # 3:  HYPOGONADISM (ICD-257.2) primary.  uncertain etiology  Problem # 4:  DIABETES MELLITUS, TYPE II (ICD-250.00) well-controlled  Problem # 5:  PANCYTOPENIA (ICD-284.1) Assessment: Improved  Medications Added to Medication List This Visit: 1)  Metformin Hcl 500 Mg Tabs (Metformin hcl) .... 2 tabs bid  Other Orders: EKG w/ Interpretation (93000) TLB-A1C / Hgb A1C (Glycohemoglobin) (83036-A1C) TLB-CBC Platelet - w/Differential (85025-CBCD) TLB-IBC Pnl (Iron/FE;Transferrin) (83550-IBC) TLB-Testosterone, Total (84403-TESTO) TLB-Luteinizing Hormone (LH) (83002-LH) TLB-FSH (Follicle Stimulating Hormone) (83001-FSH) TLB-Prolactin (84146-PROL) Est. Patient Level IV (41324)  Patient Instructions: 1)  change metformin to non-xr 2x500  mg two times a day (to help constipation). 2)  tests are being ordered for you today.  a few days after the test(s), please call [phone] to hear your test results. 3)  cc dr [facility] Filter, [facility] 4)  (update: i left message on phone-tree:  i offered androgel). 5)  his surgical risk is low and outweighed by the potential benefit of the surgery.  he is therefore medically cleared. Prescriptions: METFORMIN HCL 500 MG TABS (METFORMIN HCL) 2 tabs bid  #360 x 3   Entered and Authorized by:   Minus Breeding MD   Signed by:   Minus Breeding MD on 05/09/2009   Method used:   Electronically to        [facility] [address] (mail[facility]order)       [facility]       [address]       [place], [place]  [address]       Ph: [phone]       Fax: [phone]   RxID:   [id number] METFORMIN HCL 500 MG TABS (METFORMIN HCL) 2 tabs bid  #360 x 3   Entered and Authorized by:   Minus Breeding MD   Signed by:   Minus Breeding MD on 05/09/2009   Method used:   Print then Give to Patient   RxID:   [id number]

## 2010-05-12 NOTE — Progress Notes (Signed)
  Phone Note Call from Patient   Summary of Call: Pt called requesting phone tree id number. Called and gave pt the MRN #. Initial call taken by: Josph Macho CMA,  May 13, 2009 4:34 PM

## 2010-05-12 NOTE — Assessment & Plan Note (Signed)
Summary: 3 mth fu---stc   Vital Signs:  Patient profile:   55 year old male Height:      71 inches (180.34 cm) Weight:      299 pounds (135.91 kg) BMI:     41.85 O2 Sat:      97 % on Room air Temp:     98.2 degrees F (36.78 degrees C) oral Pulse rate:   84 / minute BP sitting:   120 / 82  (left arm) Cuff size:   large  Vitals Entered By: Brenton Grills CMA Duncan Dull) (February 24, 2010 8:54 AM)  O2 Flow:  Room air CC: 3 month F/U/aj Is Patient Diabetic? Yes   CC:  3 month F/U/aj.  History of Present Illness: pt says he feels no different on the testim no cbg record, but states cbg's are well-controlled.  denies n/v  Current Medications (verified): 1)  Neurontin 300 Mg  Caps (Gabapentin) .... Take 2 Tid 2)  Klor-Con M20 20 Meq  Tbcr (Potassium Chloride Crys Cr) .... Take 1 By Mouth Qd 3)  Trazodone Hcl 50 Mg  Tabs (Trazodone Hcl) .... Take 1 At Bedtime 4)  Fish Oil 1000 Mg  Caps (Omega-3 Fatty Acids) .... Take 3 By Mouth Qd 5)  Viagra 100 Mg  Tabs (Sildenafil Citrate) .... Use Prn 6)  Eql Omeprazole 20 Mg  Tbec (Omeprazole) .... Qd 7)  Omeprazole 20 Mg Cpdr (Omeprazole) .... Take 1 Capsule By Mouth Once A Day 8)  Zestoretic 10-12.5 Mg Tabs (Lisinopril-Hydrochlorothiazide) .... Qd 9)  Onetouch Ultra Test  Strp (Glucose Blood) .... Check Blood Sugars Two Times A Day 10)  Meloxicam 15 Mg Tabs (Meloxicam) .Marland Kitchen.. 1 Daily 11)  Hydrocodone/apap 325 Mg .Marland Kitchen.. 1 Tab By Mouth 2 Daily 12)  Simvastatin 40 Mg Tabs (Simvastatin) .Marland Kitchen.. 1 Qhs 13)  Metformin Hcl 500 Mg Xr24h-Tab (Metformin Hcl) .... 2 Tabs Two Times A Day 14)  Testim 1 % Gel (Testosterone) .... 5 Grams Once Daily 15)  Bromocriptine Mesylate 2.5 Mg Tabs (Bromocriptine Mesylate) .... 1/2 Tab At Bedtime  Allergies (verified): 1)  ! Actos (Pioglitazone Hcl)  Past History:  Past Medical History: Last updated: 12/22/2007 Diabetes mellitus, type II Hypertension Leukopenia Alcoholism Dyslipidema Chronic Neuropathy leg  pain primary Hypogonadism, prob. due 2 ETOH Renal insufficiency Esohagitis  Review of Systems       denies dizziness  Physical Exam  General:  normal appearance.   Genitalia:  Normal external male genitalia with no urethral discharge.  Additional Exam:  Hemoglobin A1C       [H]  7.3 %                       4.6-6.5 Testosterone         [L]  152.19 ng/dL       Impression & Recommendations:  Problem # 1:  DIABETES MELLITUS, TYPE II (ICD-250.00) needs increased rx  Problem # 2:  HYPOGONADISM (ICD-257.2) needs increased rx  Medications Added to Medication List This Visit: 1)  Bromocriptine Mesylate 2.5 Mg Tabs (Bromocriptine mesylate) .Marland Kitchen.. 1 tab at bedtime  Other Orders: TLB-A1C / Hgb A1C (Glycohemoglobin) (83036-A1C) TLB-Testosterone, Total (84403-TESTO) Est. Patient Level III ([address])  Patient Instructions: 1)  blood tests are being ordered for you today.  please call [phone] to hear your test results. 2)  pending the test results, please continue the same medications for now. 3)  increase bromocriptine to 2.5 mg at bedtime. 4)  check your blood sugar 1  time a day.  vary the time of day when you check, between before the 3 meals, and at bedtime.  also check if you have symptoms of your blood sugar being too high or too low.  please keep a record of the readings and bring it to your next appointment here.  please call us sooner if you are having low blood sugar episodes. 5)  Please schedule a follow-up appointment in 3 months. 6)  (update: i left message on phone-tree:  increase testim to 10 g once daily.  call when you need refill). Prescriptions: VIAGRA 100 MG  TABS (SILDENAFIL CITRATE) use prn  #18 x 3   Entered and Authorized by:   Minus Breeding MD   Signed by:   Minus Breeding MD on 02/24/2010   Method used:   Faxed to ...       [facility]       [address] [address]       [place], [place]  [address]       Ph: [phone]       Fax: [phone]   RxID:    [id number] BROMOCRIPTINE MESYLATE 2.5 MG TABS (BROMOCRIPTINE MESYLATE) 1 tab at bedtime  #90 x 3   Entered and Authorized by:   Minus Breeding MD   Signed by:   Minus Breeding MD on 02/24/2010   Method used:   Faxed to ...       [facility])       [address] [address]       [place], [place]  [address]       Ph: [phone]       Fax: [phone]   RxID:   [id number] NEURONTIN 300 MG  CAPS (GABAPENTIN) take 2 tid  #540 x 3   Entered and Authorized by:   Minus Breeding MD   Signed by:   Minus Breeding MD on 02/24/2010   Method used:   Faxed to ...       [facility])       [address] [address]       [place], [place]  [address]       Ph: [phone]       Fax: [phone]   RxID:   [id number] BROMOCRIPTINE MESYLATE 2.5 MG TABS (BROMOCRIPTINE MESYLATE) 1 tab at bedtime  #30 x 11   Entered and Authorized by:   Minus Breeding MD   Signed by:   Minus Breeding MD on 02/24/2010   Method used:   Electronically to        [facility]* (retail)       [address] [place], [place]  [address]       Ph: [phone]       Fax: [phone]   RxID:   [id number]    Orders Added: 1)  TLB-A1C / Hgb A1C (Glycohemoglobin) [83036-A1C] 2)  TLB-Testosterone, Total [84403-TESTO] 3)  Est. Patient Level III [67[address]]

## 2010-05-14 NOTE — Progress Notes (Signed)
Summary: PA-Testim  Phone Note From Pharmacy   Summary of Call: PA-Testim, called [facility] @ [phone], awaiting  form. David Lindsey  April 29, 2010 10:01 AM' Form given to [place] to complete. David Lindsey  April 29, 2010 10:11 AM [facility] @ [phone], awaiting approval. David Lindsey  April 29, 2010 3:44 PM PA Approved 04/29/10 thur 04/29/2011, pt and pharmacy aware.  Initial call taken by: David Lindsey,  April 29, 2010 4:45 PM

## 2010-05-14 NOTE — Medication Information (Signed)
Summary: Approved Testim/[facility]  Approved Testim/[facility]   Imported By: Lester [place] 05/01/2010 10:38:08  _____________________________________________________________________  External Attachment:    Type:   Image     Comment:   External Document

## 2010-05-14 NOTE — Progress Notes (Signed)
Summary: Rx refill req  Phone Note Refill Request Message from:  Patient on April 28, 2010 9:27 AM  Refills Requested: Medication #1:  KLOR-CON M20 20 MEQ  TBCR take 1 by mouth qd   Dosage confirmed as above?Dosage Confirmed   Supply Requested: 3 months  Method Requested: Electronic Initial call taken by: Margaret Pyle, CMA,  April 28, 2010 9:27 AM    Prescriptions: KLOR-CON M20 20 MEQ  TBCR (POTASSIUM CHLORIDE CRYS CR) take 1 by mouth qd  #90 x 1   Entered by:   Margaret Pyle, CMA   Authorized by:   Minus Breeding MD   Signed by:   Margaret Pyle, CMA on 04/28/2010   Method used:   Electronically to        [facility]* (retail)       [address] [place], [place]  [address]       Ph: [phone]       Fax: [phone]   RxID:   [id number]

## 2010-05-20 NOTE — Progress Notes (Signed)
Summary: Rx refill req  Phone Note Refill Request Message from:  Patient on May 12, 2010 11:35 AM  Refills Requested: Medication #1:  OMEPRAZOLE 20 MG CPDR Take 1 capsule by mouth once a day   Dosage confirmed as above?Dosage Confirmed   Supply Requested: 6 months  Medication #2:  SIMVASTATIN 40 MG TABS 1 qhs   Dosage confirmed as above?Dosage Confirmed   Supply Requested: 6 months  Method Requested: Fax to [facility] Initial call taken by: Margaret Pyle, CMA,  May 12, 2010 11:36 AM    Prescriptions: SIMVASTATIN 40 MG TABS (SIMVASTATIN) 1 qhs  #90 x 1   Entered by:   Margaret Pyle, CMA   Authorized by:   Minus Breeding MD   Signed by:   Margaret Pyle, CMA on 05/12/2010   Method used:   Electronically to        [facility]* (retail)       [facility]       [address]       [place], [place]  [address]       Ph: [phone]       Fax: [phone]   RxID:   [id number] OMEPRAZOLE 20 MG CPDR (OMEPRAZOLE) Take 1 capsule by mouth once a day  #90 x 1   Entered by:   Margaret Pyle, CMA   Authorized by:   Minus Breeding MD   Signed by:   Margaret Pyle, CMA on 05/12/2010   Method used:   Electronically to        [facility]* (retail)       [facility]       [address]       [place], [place]  [address]       Ph: [phone]       Fax: [phone]   RxID:   [id number]

## 2010-05-28 ENCOUNTER — Inpatient Hospital Stay (HOSPITAL_COMMUNITY)
Admission: EM | Admit: 2010-05-28 | Discharge: 2010-05-31 | DRG: 750 | Disposition: A | Discharge status: Home / Self Care | Payer: BC Managed Care – PPO | Attending: Internal Medicine | Admitting: Internal Medicine

## 2010-05-28 DIAGNOSIS — I10 Essential (primary) hypertension: Secondary | ICD-10-CM | POA: Diagnosis present

## 2010-05-28 DIAGNOSIS — R197 Diarrhea, unspecified: Secondary | ICD-10-CM | POA: Diagnosis present

## 2010-05-28 DIAGNOSIS — R112 Nausea with vomiting, unspecified: Secondary | ICD-10-CM | POA: Diagnosis present

## 2010-05-28 DIAGNOSIS — F10231 Alcohol dependence with withdrawal delirium: Principal | ICD-10-CM | POA: Diagnosis present

## 2010-05-28 DIAGNOSIS — F1020 Alcohol dependence, uncomplicated: Secondary | ICD-10-CM | POA: Diagnosis present

## 2010-05-28 DIAGNOSIS — F329 Major depressive disorder, single episode, unspecified: Secondary | ICD-10-CM | POA: Diagnosis present

## 2010-05-28 DIAGNOSIS — E860 Dehydration: Secondary | ICD-10-CM | POA: Diagnosis present

## 2010-05-28 DIAGNOSIS — F10931 Alcohol use, unspecified with withdrawal delirium: Principal | ICD-10-CM | POA: Diagnosis present

## 2010-05-28 DIAGNOSIS — E1142 Type 2 diabetes mellitus with diabetic polyneuropathy: Secondary | ICD-10-CM | POA: Diagnosis present

## 2010-05-28 DIAGNOSIS — E7800 Pure hypercholesterolemia, unspecified: Secondary | ICD-10-CM | POA: Diagnosis present

## 2010-05-28 DIAGNOSIS — F3289 Other specified depressive episodes: Secondary | ICD-10-CM | POA: Diagnosis present

## 2010-05-28 DIAGNOSIS — E1149 Type 2 diabetes mellitus with other diabetic neurological complication: Secondary | ICD-10-CM | POA: Diagnosis present

## 2010-05-28 LAB — CBC
Hemoglobin: 15.6 g/dL (ref 13.0–17.0)
MCH: 29.7 pg (ref 26.0–34.0)
MCHC: 34.7 g/dL (ref 30.0–36.0)
Platelets: 110 10*3/uL — ABNORMAL LOW (ref 150–400)
RDW: 14.5 % (ref 11.5–15.5)
WBC: 5.8 10*3/uL (ref 4.0–10.5)

## 2010-05-28 LAB — COMPREHENSIVE METABOLIC PANEL
ALT: 46 U/L (ref 0–53)
AST: 60 U/L — ABNORMAL HIGH (ref 0–37)
Alkaline Phosphatase: 53 U/L (ref 39–117)
Creatinine, Ser: 1.35 mg/dL (ref 0.4–1.5)
GFR calc Af Amer: >60 mL/min (ref >60)
GFR calc non Af Amer: 55 mL/min — ABNORMAL LOW (ref >60)
Sodium: 144 mEq/L (ref 135–145)
Total Bilirubin: 0.8 mg/dL (ref 0.3–1.2)

## 2010-05-28 LAB — DIFFERENTIAL
Basophils Absolute: 0 10*3/uL (ref 0.0–0.1)
Lymphs Abs: 3 10*3/uL (ref 0.7–4.0)
Monocytes Absolute: 0.5 10*3/uL (ref 0.1–1.0)
Monocytes Relative: 9 % (ref 3–12)
Neutrophils Relative %: 38 % — ABNORMAL LOW (ref 43–77)

## 2010-05-28 LAB — RAPID URINE DRUG SCREEN, HOSP PERFORMED
Amphetamines: NOT DETECTED
Benzodiazepines: NOT DETECTED
Tetrahydrocannabinol: NOT DETECTED

## 2010-05-28 LAB — URINE MICROSCOPIC-ADD ON

## 2010-05-28 LAB — URINALYSIS, ROUTINE W REFLEX MICROSCOPIC
Bilirubin Urine: NEGATIVE
Protein, ur: 100 mg/dL — AB

## 2010-05-28 LAB — ETHANOL: Alcohol, Ethyl (B): 263 mg/dL — ABNORMAL HIGH (ref 0–10)

## 2010-05-29 DIAGNOSIS — F1020 Alcohol dependence, uncomplicated: Secondary | ICD-10-CM

## 2010-05-29 LAB — GLUCOSE, CAPILLARY
Glucose-Capillary: 105 mg/dL — ABNORMAL HIGH (ref 70–99)
Glucose-Capillary: 144 mg/dL — ABNORMAL HIGH (ref 70–99)
Glucose-Capillary: 129 mg/dL — ABNORMAL HIGH (ref 70–99)

## 2010-05-30 DIAGNOSIS — F10229 Alcohol dependence with intoxication, unspecified: Secondary | ICD-10-CM

## 2010-05-30 LAB — GLUCOSE, CAPILLARY
Glucose-Capillary: 181 mg/dL — ABNORMAL HIGH (ref 70–99)
Glucose-Capillary: 152 mg/dL — ABNORMAL HIGH (ref 70–99)

## 2010-05-30 LAB — CBC
MCHC: 33.1 g/dL (ref 30.0–36.0)
Platelets: 88 10*3/uL — ABNORMAL LOW (ref 150–400)
RDW: 14.4 % (ref 11.5–15.5)
WBC: 3.1 10*3/uL — ABNORMAL LOW (ref 4.0–10.5)

## 2010-05-30 LAB — BASIC METABOLIC PANEL
Calcium: 7.7 mg/dL — ABNORMAL LOW (ref 8.4–10.5)
GFR calc Af Amer: >60 mL/min (ref >60)
GFR calc non Af Amer: 52 mL/min — ABNORMAL LOW (ref >60)
Glucose, Bld: 153 mg/dL — ABNORMAL HIGH (ref 70–99)
Sodium: 138 mEq/L (ref 135–145)

## 2010-05-31 LAB — DIFFERENTIAL
Basophils Relative: 0 % (ref 0–1)
Eosinophils Absolute: 0.1 10*3/uL (ref 0.0–0.7)
Eosinophils Relative: 4 % (ref 0–5)
Lymphs Abs: 1.3 10*3/uL (ref 0.7–4.0)

## 2010-05-31 LAB — CBC
MCH: 28.9 pg (ref 26.0–34.0)
MCV: 87.3 fL (ref 78.0–100.0)
Platelets: 92 10*3/uL — ABNORMAL LOW (ref 150–400)
RDW: 14 % (ref 11.5–15.5)
WBC: 3.2 10*3/uL — ABNORMAL LOW (ref 4.0–10.5)

## 2010-05-31 LAB — COMPREHENSIVE METABOLIC PANEL
Albumin: 3 g/dL — ABNORMAL LOW (ref 3.5–5.2)
BUN: 7 mg/dL (ref 6–23)
Creatinine, Ser: 1.19 mg/dL (ref 0.4–1.5)
Potassium: 3.7 mEq/L (ref 3.5–5.1)
Total Protein: 5.7 g/dL — ABNORMAL LOW (ref 6.0–8.3)

## 2010-05-31 LAB — GLUCOSE, CAPILLARY

## 2010-05-31 NOTE — H&P (Signed)
NAMEDAMONDRE, PFEIFLE              ACCOUNT NO.:  [id number]  MEDICAL RECORD NO.:  [id number]           PATIENT TYPE:  I  LOCATION:  1415                         FACILITY:  [facility]  PHYSICIAN:  Arne Cleveland, MD       DATE OF BIRTH:  12/27/1955  DATE OF ADMISSION:  05/28/2010 DATE OF DISCHARGE:                             HISTORY & PHYSICAL   PRIMARY CARE PHYSICIAN:  Sean A. Everardo All, MD  CHIEF COMPLAINT:  Vomiting and diarrhea.  HISTORY OF PRESENT ILLNESS:  Mr. Coiner is a 55 year old African American male who presented to the ER complaining of drinking about a pint a day for the past 3 weeks.  He had been having vomiting and diarrhea since he started drinking 3 weeks ago.  He had been drinking because he was depressed about being laid off.  He states that the emesis was yellow in color and the diarrhea was watery.  He denied any fever, any hematemesis, any abdominal pain.  He has had problems of drinking in the past and has gone through detox and was requesting detox on this admission.  The patient was seen by [facility], but they recommended that he be admitted for detox through the hospitalist service because of his multiple medical problems.  PAST MEDICAL HISTORY: 1. Alcohol abuse. 2. Diabetes. 3. Hypertension. 4. Hyperlipidemia. 5. Peptic ulcer disease. 6. Peripheral neuropathy. 7. Leukopenia. 8. Obesity.  PAST SURGICAL HISTORY:  Appendectomy.  MEDICATIONS: 1. Lisinopril/hydrochlorothiazide 20/25 daily. 2. Omeprazole 40 mg daily. 3. Metformin 500 mg twice a day. 4. Actos 30 mg daily. 5. Simvastatin 80 mg daily. 6. Desyrel 50 mg daily at bedtime. 7. Neurontin 300 mg 3 times a day.  ALLERGIES:  He has no known drug allergies.  REVIEW OF SYSTEMS:  His review of systems is negative for any other issues other than those presented on history of present illness.  All systems were reviewed.  FAMILY HISTORY:  Noncontributory.  SOCIAL HISTORY:  The patient is  married, unemployed.  He has a history of alcohol abuse, but denies drug or tobacco abuse.  LABORATORY DATA:  Urine drug screen was negative for opiates, cocaine, benzos, amphetamines, marijuana, or barbiturates.  Urinalysis was negative on dipstick and micro.  Lipase was normal.  Comprehensive metabolic panel was normal except for a glucose of 132 mg/dL, ALT of 46 international units/L.  His alcohol level was 263 mg/dL.  His CBC showed a white count of 5800, hemoglobin 15.6, hematocrit 45.0 with a normal differential.  The patient was started treatment in the TCU awaiting, being admitted and has had no further episodes of nausea, vomiting, diarrhea, and states he is feeling better.  PHYSICAL EXAMINATION:  GENERAL:  A well-developed, well-nourished, obese, African American male, in no acute distress. HEAD:  Atraumatic, normocephalic. VITAL SIGNS:  Temperature 98.4, pulse 83, blood pressure 142/89, respirations 20, pulse ox 98% on room air. EYES:  Pupils equal, round, and reactive to light.  Disks sharp. Extraocular muscle range of motion is full and his sclerae are anicteric. EARS, NOSE, and THROAT:  Appear normal.  Mucous membranes are moist.  He has been receiving IV  fluids for about 20 hours since he has been in the ER for 20 hours. NECK:  Supple without jugular venous distention. CHEST:  Nontender. LUNGS:  Clear to auscultation and percussion. HEART:  Regular rhythm and rate.  Normal S1, S2 without murmur, gallop, or rub. ABDOMEN:  Obese, soft, nontender with normoactive bowel sounds.  No hepatomegaly.  No splenomegaly.  No palpable mass. GENITAL:  Normal. RECTAL:  Deferred at the patient's request. EXTREMITIES:  There is no clubbing, cyanosis, or edema. SKIN:  There are no rashes noted. NEURO::  Awake, alert, oriented x3.  Cranial nerves II through XII are intact.  Motor is 5/5 in the upper and lower extremities.  Sensory intact to fine touch and pinprick. LYMPH:   Normal.  IMPRESSION: 1. Alcohol withdrawal and alcohol abuse. 2. Diabetes type 2, poorly controlled. 3. Nausea, vomiting, and diarrhea secondary to alcohol withdrawal and     diabetes. 4. Dehydration. 5. Depression.  PLAN:  My plan is to admit the patient to medical bed, institute alcohol withdrawal protocol, but use Librium instead of Ativan at the request of Dr. Rogers Blocker, who is the [facility] physician who has consulted on the patient.  Treat his medical problems during detox and Behavioral Health to consult and Dr. Rogers Blocker will be consulted.  The patient will be admitted to [facility], team 5.     Arne Cleveland, MD     ML/MEDQ  D:  05/29/2010  T:  05/30/2010  Job:  [id number]  cc:   Gregary Signs A. Everardo All, MD [address] [place] [address]  Electronically Signed by Arne Cleveland  on 05/31/2010 06:32:44 AM

## 2010-05-31 NOTE — Consult Note (Signed)
  NAMEXAVIOR, NIAZI              ACCOUNT NO.:  [id number]  MEDICAL RECORD NO.:  [id number]           PATIENT TYPE:  E  LOCATION:  WLED                         FACILITY:  [facility]  PHYSICIAN:  Eulogio Ditch, MD DATE OF BIRTH:  [birth year]  DATE OF CONSULTATION:  05/29/2010 DATE OF DISCHARGE:                                CONSULTATION   REASON FOR CONSULTATION:  Alcohol detox.  HISTORY OF PRESENT ILLNESS:  A 55 year old male with long history of alcohol abuse who came for alcohol detox.  The patient reported that he drinks 1 pint of alcohol daily.  The patient has been in the past came for number of detox and rehabs.  The patient denies any suicidal or homicidal ideation.  Denies hearing any voices.  The patient is logical and goal directed.  Not internally preoccupied.  Not delusional.  Not having any psychotic or manic symptoms.  MEDICAL HISTORY:  The patient has a history of hypertension, diabetes mellitus, neuropathy.  He is on a number of medications, AndroGel, gabapentin, simvastatin, potassium chloride, metformin, omeprazole, and Viagra.  ALLERGIES:  No known drug allergies.  Labs within normal limits except for glucose which is 132, alcohol level 263.  MENTAL STATUS EXAM:  The patient is calm, cooperative during interview. Fair eye contact, pleasant on approach.  Speech soft and slow.  Mood euthymic, affect mood congruent.  Thought process, logical and goal directed.  Thought content, not suicidal or homicidal, not delusional. Thought perception, no audiovisual hallucination reported, not internally preoccupied.  Cognition alert, awake, oriented x3.  Memory immediate, recent, remote intact.  Attention and concentration fair. Abstraction ability good.  Insight and judgment intact.  DIAGNOSES:  Axis I:  Chronic alcohol dependence. Axis II:  Deferred. Axis III:  See medical notes. Axis IV:  Chronic alcohol abuse. Axis V:  50.  RECOMMENDATIONS: 1. I spoke with  Dr. Onalee Hua and I discussed with him about the patient     case as the patient has number of medical problems.  He is not     having any suicidal or homicidal ideation, is not actively     psychotic or having manic symptoms.  The patient can be detoxed on     the medical floor. 2. I will follow up on this patient on the medical floor if needed. 3. The patient is getting Ativan for detox in the ER.  The patient can     be started on Librium detox protocol once he goes to the medical     floor.     Eulogio Ditch, MD     SA/MEDQ  D:  05/29/2010  T:  05/29/2010  Job:  [id number]  Electronically Signed by Eulogio Ditch  on 05/31/2010 06:21:05 AM

## 2010-06-22 NOTE — Discharge Summary (Signed)
David Lindsey, David Lindsey              ACCOUNT NO.:  [id number]  MEDICAL RECORD NO.:  [id number]           PATIENT TYPE:  I  LOCATION:  1415                         FACILITY:  [facility]  PHYSICIAN:  Pincus Large, MD     DATE OF BIRTH:  [birth year]  DATE OF ADMISSION:  05/30/2010 DATE OF DISCHARGE:  05/31/2010                              DISCHARGE SUMMARY   PRIMARY CARE PHYSICIAN:  Sean A. Everardo All, MD  REASON FOR ADMISSION:  Vomiting, diarrhea.  The patient wants detox.  HOSPITAL COURSE:  This is a 55 year old African American male with past medical history of alcohol abuse, diabetes type 2 who came to the ER having vomiting and diarrhea and he has been drinking.  He was being depressed because he was laid off.  The patient denies any fever, hematemesis, abdominal pain.  He had problem with drinking in the past and he has gone to detox and requesting detox on this admission.  The patient was by Behavior Health but they recommended to admit for detox on the medical floor.  The patient was admitted.  He received IV fluid, multivitamin, thiamine, and Librium q.6 h.  The patient was seen by Psych on May 30, 2010, by Dr. Marlis Edelson who recommend that the patient is not interested in residential substance abuse detox.  The patient denies any tremor.  He denies any hallucination.  No suicidal ideation normal.  No homicidal ideation.  The patient has been through the [facility] before and he wants to go home to follow up with them.  DISCHARGE DIAGNOSES: 1. Chronic alcohol dependence, alcohol withdrawal. 2. History of diabetes type 2. 3. Peripheral neuropathy. 4. Hypercholesterolemia.  DISCHARGE MEDICATIONS: 1. Folic acid 1 mg daily. 2. Librium 50 mg q.6 for 2 days and then Librium 25 mg q.8. x2 days. 3. Thiamine 100 mg daily. 4. Testosterone 1 application topically. 5. Aspirin 81 mg daily. 6. Fish oil over-the-counter 2 capsules by mouth every morning. 7. Gabapentin 300 mg by  mouth t.i.d. 8. Lisinopril/hydrochlorothiazide 20/25 every morning. 9. Metformin 500 p.o. b.i.d. 10.Multivitamin 1 tablet daily. 11.Omeprazole 20 daily. 12.Potassium chloride 20 mEq daily. 13.Simvastatin 80 daily. 14.Trazodone 50 q.h.s. p.r.n. 15.Viagra 100 mg 1 tablet as needed. 16.Vitamin D3 1000 units 1 tab by mouth daily.  IMAGING DATA:  Images that were done, none.  LABORATORY DATA:  WBC 3.8, hemoglobin is 7.6, platelet is 92.  Glucose 155.  Sodium is 140, potassium 3.7, chloride 108, bicarb is 26, glucose 144, AST is 52, ALT is 42.  BUN was normal.  PHYSICAL EXAMINATION:  VITAL SIGNS:  Temperature is 97, pulse is 70, respirations 16, blood pressure is 137/83. GENERAL:  Awake, oriented x3.  Lying in bed, in no acute distress. HEENT:  Normocephalic, atraumatic.  Conjunctivae pink.  Pupils equal and reactive to light bilaterally. LUNGS: Bilateral air entry.  No wheezes or crackles. ABDOMEN:  Soft.  Bowel sounds present. NEUROLOGIC:  Awake, oriented x3.  Motor is 5/5 in all 4 extremities.  No nystagmus, no tremor.  DISPOSITION:  Home.  To follow up with [facility].  RESTRICTION:  No driving for 4 days while  taking the Librium.  DIET:  A 2-g sodium 1800 diet.          ______________________________ Pincus Large, MD     SA/MEDQ  D:  05/31/2010  T:  05/31/2010  Job:  [id number]  Electronically Signed by Pincus Large MD on 06/22/2010 05:58:41 PM

## 2010-06-24 LAB — URINALYSIS, ROUTINE W REFLEX MICROSCOPIC
Bilirubin Urine: NEGATIVE
Ketones, ur: NEGATIVE mg/dL
Nitrite: NEGATIVE
pH: 6 (ref 5.0–8.0)

## 2010-06-24 LAB — COMPREHENSIVE METABOLIC PANEL
ALT: 43 U/L (ref 0–53)
AST: 72 U/L — ABNORMAL HIGH (ref 0–37)
CO2: 25 mEq/L (ref 19–32)
Calcium: 9.1 mg/dL (ref 8.4–10.5)
Chloride: 103 mEq/L (ref 96–112)
GFR calc Af Amer: >60 mL/min (ref >60)
GFR calc non Af Amer: >60 mL/min (ref >60)
Sodium: 139 mEq/L (ref 135–145)
Total Bilirubin: 1 mg/dL (ref 0.3–1.2)

## 2010-06-24 LAB — DIFFERENTIAL
Eosinophils Absolute: 0.1 10*3/uL (ref 0.0–0.7)
Eosinophils Relative: 2 % (ref 0–5)
Lymphs Abs: 2.3 10*3/uL (ref 0.7–4.0)

## 2010-06-24 LAB — CBC
Hemoglobin: 15.9 g/dL (ref 13.0–17.0)
RBC: 5.36 MIL/uL (ref 4.22–5.81)

## 2010-06-24 LAB — URINE MICROSCOPIC-ADD ON

## 2010-06-24 LAB — LIPASE, BLOOD: Lipase: 45 U/L (ref 11–59)

## 2010-07-01 LAB — URINALYSIS, ROUTINE W REFLEX MICROSCOPIC
Glucose, UA: NEGATIVE mg/dL
Hgb urine dipstick: NEGATIVE
Specific Gravity, Urine: 1.026 (ref 1.005–1.030)
Urobilinogen, UA: 0.2 mg/dL (ref 0.0–1.0)

## 2010-07-01 LAB — BASIC METABOLIC PANEL
BUN: 13 mg/dL (ref 6–23)
BUN: 6 mg/dL (ref 6–23)
BUN: 9 mg/dL (ref 6–23)
CO2: 28 mEq/L (ref 19–32)
Calcium: 9 mg/dL (ref 8.4–10.5)
Chloride: 100 mEq/L (ref 96–112)
Chloride: 103 mEq/L (ref 96–112)
Creatinine, Ser: 1.11 mg/dL (ref 0.4–1.5)
GFR calc Af Amer: >60 mL/min (ref >60)
GFR calc Af Amer: >60 mL/min (ref >60)
GFR calc non Af Amer: >60 mL/min (ref >60)
Glucose, Bld: 173 mg/dL — ABNORMAL HIGH (ref 70–99)
Potassium: 3.8 mEq/L (ref 3.5–5.1)
Potassium: 3.9 mEq/L (ref 3.5–5.1)
Sodium: 137 mEq/L (ref 135–145)

## 2010-07-01 LAB — GLUCOSE, CAPILLARY
Glucose-Capillary: 113 mg/dL — ABNORMAL HIGH (ref 70–99)
Glucose-Capillary: 155 mg/dL — ABNORMAL HIGH (ref 70–99)
Glucose-Capillary: 193 mg/dL — ABNORMAL HIGH (ref 70–99)
Glucose-Capillary: 195 mg/dL — ABNORMAL HIGH (ref 70–99)
Glucose-Capillary: 199 mg/dL — ABNORMAL HIGH (ref 70–99)
Glucose-Capillary: 207 mg/dL — ABNORMAL HIGH (ref 70–99)
Glucose-Capillary: 207 mg/dL — ABNORMAL HIGH (ref 70–99)

## 2010-07-01 LAB — PROTIME-INR
Prothrombin Time: 19.1 seconds — ABNORMAL HIGH (ref 11.6–15.2)
Prothrombin Time: 19.1 seconds — ABNORMAL HIGH (ref 11.6–15.2)

## 2010-07-01 LAB — CBC
HCT: 32.5 % — ABNORMAL LOW (ref 39.0–52.0)
HCT: 42.3 % (ref 39.0–52.0)
Hemoglobin: 14.3 g/dL (ref 13.0–17.0)
MCHC: 34.2 g/dL (ref 30.0–36.0)
MCV: 89.7 fL (ref 78.0–100.0)
MCV: 90.1 fL (ref 78.0–100.0)
RBC: 3.61 MIL/uL — ABNORMAL LOW (ref 4.22–5.81)
RBC: 4.72 MIL/uL (ref 4.22–5.81)
WBC: 4.9 10*3/uL (ref 4.0–10.5)
WBC: 6 10*3/uL (ref 4.0–10.5)

## 2010-07-01 LAB — DIFFERENTIAL
Eosinophils Absolute: 0.2 10*3/uL (ref 0.0–0.7)
Eosinophils Relative: 5 % (ref 0–5)
Lymphocytes Relative: 43 % (ref 12–46)
Lymphs Abs: 2.1 10*3/uL (ref 0.7–4.0)
Monocytes Absolute: 0.5 10*3/uL (ref 0.1–1.0)
Monocytes Relative: 10 % (ref 3–12)

## 2010-07-01 LAB — TYPE AND SCREEN
ABO/RH(D): O POS
Antibody Screen: NEGATIVE

## 2010-07-03 ENCOUNTER — Other Ambulatory Visit: Payer: Self-pay

## 2010-07-03 MED ORDER — POTASSIUM CHLORIDE CRYS ER 20 MEQ PO TBCR: 20.0000 meq | EXTENDED_RELEASE_TABLET | Freq: Every day | ORAL | Status: DC

## 2010-07-03 NOTE — Telephone Encounter (Signed)
Pt called requesting refill of Potassium

## 2010-07-16 ENCOUNTER — Other Ambulatory Visit: Payer: Self-pay

## 2010-07-16 MED ORDER — METFORMIN HCL ER 500 MG PO TB24: 1000.0000 mg | ORAL_TABLET | Freq: Two times a day (BID) | ORAL | Status: DC

## 2010-07-16 MED ORDER — LISINOPRIL-HYDROCHLOROTHIAZIDE 10-12.5 MG PO TABS: 1.0000 | ORAL_TABLET | Freq: Every day | ORAL | Status: DC

## 2010-07-28 LAB — COMPREHENSIVE METABOLIC PANEL
AST: 38 U/L — ABNORMAL HIGH (ref 0–37)
Albumin: 4.2 g/dL (ref 3.5–5.2)
BUN: 15 mg/dL (ref 6–23)
Creatinine, Ser: 1.15 mg/dL (ref 0.4–1.5)
GFR calc Af Amer: >60 mL/min (ref >60)
Potassium: 3.6 mEq/L (ref 3.5–5.1)
Total Protein: 7.6 g/dL (ref 6.0–8.3)

## 2010-07-28 LAB — CBC
HCT: 41.7 % (ref 39.0–52.0)
MCV: 93 fL (ref 78.0–100.0)
Platelets: 202 10*3/uL (ref 150–400)
RDW: 15.3 % (ref 11.5–15.5)
WBC: 7.2 10*3/uL (ref 4.0–10.5)

## 2010-07-28 LAB — URINALYSIS, ROUTINE W REFLEX MICROSCOPIC
Bilirubin Urine: NEGATIVE
Glucose, UA: NEGATIVE mg/dL
Nitrite: NEGATIVE
Specific Gravity, Urine: 1.008 (ref 1.005–1.030)
pH: 7.5 (ref 5.0–8.0)

## 2010-07-28 LAB — RAPID URINE DRUG SCREEN, HOSP PERFORMED
Barbiturates: NOT DETECTED
Opiates: NOT DETECTED
Tetrahydrocannabinol: NOT DETECTED

## 2010-07-28 LAB — ETHANOL: Alcohol, Ethyl (B): 5 mg/dL (ref 0–10)

## 2010-07-28 LAB — GLUCOSE, CAPILLARY
Glucose-Capillary: 108 mg/dL — ABNORMAL HIGH (ref 70–99)
Glucose-Capillary: 132 mg/dL — ABNORMAL HIGH (ref 70–99)
Glucose-Capillary: 138 mg/dL — ABNORMAL HIGH (ref 70–99)
Glucose-Capillary: 150 mg/dL — ABNORMAL HIGH (ref 70–99)

## 2010-08-25 NOTE — H&P (Signed)
David Lindsey, David Lindsey              ACCOUNT NO.:  [id number]   MEDICAL RECORD NO.:  [id number]          PATIENT TYPE:  EMS   LOCATION:  ED                           FACILITY:  [facility]   PHYSICIAN:  Valerie A. Felicity Coyer, MDDATE OF BIRTH:  22-Feb-1956   DATE OF ADMISSION:  04/15/2007  DATE OF DISCHARGE:                              HISTORY & PHYSICAL   PRIMARY CARE PHYSICIAN:  Sean A. Everardo All, MD.   CHIEF COMPLAINT:  Nausea, vomiting with abdominal pain x 24 hours.   HISTORY OF PRESENT ILLNESS:  The patient is a 55 year old heavy-set  black gentleman with history of type 2 diabetes, hypertension and peptic  ulcer disease related to alcohol use, who presents to the [facility]  Emergency Room this morning complaining of nausea, vomiting with diffuse  abdominal pain since yesterday a.m.  He awoke from sleep per routine on  the morning of April 14, 2007 and felt nauseated.  During the morning,  these symptoms proceeded to be diffuse abdominal pain then associated  with vomiting all through the day and all through the night.  No  diarrhea.  No fevers or chills.  He cannot keep anything down, but keeps  trying to gulp water.  There is no hematemesis, only liquid clear,  yellow and bile.  No coffee grounds.  No sick contacts.  No previous  surgery.  No history of pancreatitis or biliary disease.  He did have  heavy binge of alcohol use over the holiday, but reports otherwise his  alcohol use has been under control.  This was confirmed by his wife.  However, last drink of alcohol was yesterday between these episodes of  vomiting.  He has been compliant with his medications as prescribed.  Currently he describes his pain in his belly is all over, 8/10 and  requesting something for relief.   PAST MEDICAL HISTORY:  1. Type 2 diabetes.  2. Hypertension.  3. Dyslipidemia.  4. History of peptic ulcer disease with history of heavy alcohol      abuse, status post rehab.  5.  Chronic neuropathic  lower extremity      pain.  5. 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   CURRENT MEDICATIONS:  1. Neurontin 600 mg t.i.d.  2. Zestoretic 20/25 daily.  3. Metformin 1 gm b.i.d.  4. KCl 20 mEq daily.  5. Trazodone 150 mg daily.  6. Actos 30 mg daily.  7. Fish oil 1000 mg 3 tablets daily.  8. Zocor 80 mg daily.  9. Aspirin 81 mg daily.  10.Viagra p.r.n.   ALLERGIES:  NO KNOWN DRUG ALLERGIES.   FAMILY HISTORY:  Noncontributory to current illness.   SOCIAL HISTORY:  He lives with his spouse.  Occasional alcohol binges,  but does not smoke.   REVIEW OF SYSTEMS:  There is no chest pain, neck pain or back pain.  No  cough.  No dysuria.  No trouble swallowing.  No falls or trauma.  Please  see HPI above for other details, negative except as listed.   PHYSICAL EXAMINATION:  VITAL SIGNS:  Temperature of 97.4, blood pressure  110/69, pulse of 112, respirations 32, satting 100% on room air.  GENERAL:  He is an uncomfortable and mildly hyperventilating heavy-set  black gentleman who has spouse and family at bedside.  He is fidgeting  on exam and slightly warm to touch, but not diaphoretic.  He is awake,  alert, oriented x 3, appropriate.  HEENT: PERRL.  EOMI.  Oropharynx is dry, but clear without lesions.  NECK:  Thick, but supple.  No appreciable JVD or goiter.  LUNGS:  Slightly increased respiratory rate, but are clear with shallow  respiration.  There is no wheeze, crackle, rhonchi or increased work of  breathing.  CARDIOVASCULAR:  Tachy and distant, but appears regular on the monitor.  ABDOMEN:  Obese, soft with hyperactive bowel sounds and diffuse mild  nonspecific tenderness.  No rebound or guarding present.  EXTREMITIES:  Show trace bilateral lower extremity edema.  NEUROLOGICAL:  He is awake, alert, oriented x 4 with cranial nerves II-  XII symmetrically intact.  Moving all extremities without deficits and  follows commands without hesitation.   LABORATORY  DATA:  Normal CBC, white count 84, hemoglobin 15, platelets  173.  Basic metabolic panel notable for a bicarb level of 14, anion gap  23, glucose is normal at 116, BUN and creatinine also normal at 21 and  1.16, AST mildly elevated at 59, but other LFTs are normal, lipase  normal at 24.  Arterial blood gas shows normal pH 7.41, PCO2 26 is  depressed, but normal PO2 86 on room air.   DIAGNOSTICS:  Abdominal two-view x-ray:  Nonspecific bowel gas pattern,  question enteritis, no evidence of obstruction.   ASSESSMENT/PLAN:  1. Positive anion gap metabolic acidosis with apparent respiratory      compensation.  There is no evidence of apparent DKA uremia.  We      will check lactic acid level, especially as he is on metformin.  No      evidence of sepsis, ischemic event or toxicity.  No clear history      of ethanol ingestion with his recent alcohol use or overdose of      aspirin ingestion.  This may also be exacerbated at this time by      dehydration and GI loss from nausea and vomiting as he is not      significantly acidotic on his actual ABG with a normal pH.  We will      check serum osmolality and follow the BMET q.12 h., gentle IV fluid      and monitor clinically.  2. Nausea and vomiting.  Question gastroenteritis, question reactive      or other cause of problem number 1 above.  His KUB is negative and      exam is benign.  If his symptoms persist despite conservative      fluid, treatment with Zofran and p.r.n. morphine and/or if he      becomes toxic with increased pain, fever, etc., we will check a CT      abdomen and pelvis for further intra-abdominal evaluation.  There      is no evidence of pancreatitis with a normal lipase.  We will check      amylase.  Question if this is due to his recent alcohol binge.  He      is hemodynamically stable and no reported hematemesis to doubt any      GI bleed, but given his history of peptic ulcer disease with  ongoing alcohol use, we  will use the PPI.  Recheck CBC in the      morning.  Again no DKA with a normal glucose and normal pH.  Check      EKG to rule out inferior changes especially in the setting of      diabetes, but there is no chest pain or apparent anginal symptoms.  3. Type 2 diabetes.  We will discontinue metformin given the above      concerns.  Hold Actos at this time given nausea, vomiting and treat      only with sliding-scale insulin while his symptoms are ongoing and      check A1c in the morning.  4. Hypertension.  Normal BP at this time.  Hold home Zestoretic      pending further evaluation.  5. Dyslipidemia.  We will hold home statin.  6. Chronic neuropathic lower extremity pain.  Resume Neurontin when      tolerating p.o.  Please see full order set for other details.      Valerie A. Felicity Coyer, MD  Electronically Signed     VAL/MEDQ  D:  04/15/2007  T:  04/15/2007  Job:  [id number]

## 2010-08-25 NOTE — Discharge Summary (Signed)
David Lindsey, David Lindsey NO.:  [id number]   MEDICAL RECORD NO.:  [id number]          PATIENT TYPE:  INP   LOCATION:  1539                         FACILITY:  [facility]   PHYSICIAN:  Valerie A. Felicity Coyer, MDDATE OF BIRTH:  [birth year]   DATE OF ADMISSION:  04/15/2007  DATE OF DISCHARGE:  04/17/2007                               DISCHARGE SUMMARY   DISCHARGE DIAGNOSES:  1. Lactic acidosis presumably due to metformin, see details below,      resolved; hold further metformin treatment at this time.  2. Nausea and vomiting, exacerbated by #1, suspect due to post EtOH      binge, see details below.  3. Type 2 diabetes.  Continue home Actos, but hold metformin.  4. Hypertension.  5. Dyslipidemia.  6. Chronic neuropathic lower extremity pain.  7. Hypokalemia secondary to gastrointestinal loss, replaced and      resolved.   DISCHARGE MEDICATIONS:  Discontinuation of metformin at this time.  Other medications are as prior to admission and include:  1. Neurontin 600 mg t.i.d.  2. Zestoretic 20/25 once daily.  3. Potassium 20 mEq once daily.  4. Trazodone 150 mg once nightly.  5. Actos 30 mg once daily.  6. Fish oil 1000 mg three tablets daily.  7. Zocor 80 mg once nightly.  8. Aspirin 81 mg daily.   HOSPITAL FOLLOWUP:  With primary care physician, Dr. Romero Belling,  patient to call for appointment in the next 2-3 weeks for diabetes  followup.  He is also instructed to call MD or return to the emergency  room if he has pain, fever or more vomiting following discharge.  The  patient is provided a work excuse from January 3 through January 5 and  to return to work January 6 without restrictions.   HOSPITAL COURSE:  PROBLEM #1 - NAUSEA, VOMITING AND ABDOMINAL PAIN WITH  LACTIC ACIDOSIS:  The patient is a 55 year old gentleman with multiple  chronic medical problems and a history of alcohol abuse, who presented  to the emergency room at [facility] following holiday alcohol  binge.  He woke up the morning of admission with nausea, progressive vomiting  and abdominal pain.  Lab workup at the emergency room found him to be  acidotic with a low bicarb and yet ABG compensated with a normal pH.  He  was admitted for IV fluids and further evaluation as well as symptomatic  treatment.  His lactic acid was found to be mildly elevated at 2.4 and  his metformin was discontinued.  There was no other evidence of  hypoperfusion state, sepsis or other explanation clinically for his  lactic acidosis, which may have been triggered from the alcohol binge  with subsequent dehydration.  At this time we have discontinued  metformin.  Following 24 hours of hydration and symptomatic control with  Zofran, his symptoms have all returned to normal.  He has had no further  vomiting or pain and nausea has resolved.  He is tolerating p.o. and  anxious for discharge home.   PROBLEM #2 - OTHER MEDICAL ISSUES:  The patient's other chronic medical  issues were  quiescent this hospitalization.  Continue home medicines as  listed above.      Valerie A. Felicity Coyer, MD  Electronically Signed     VAL/MEDQ  D:  04/17/2007  T:  04/17/2007  Job:  [id number]

## 2010-08-25 NOTE — H&P (Signed)
David Lindsey, David Lindsey NO.:  [id number]   MEDICAL RECORD NO.:  [id number]          PATIENT TYPE:  IPS   LOCATION:  0306                          FACILITY:  [facility]   PHYSICIAN:  Jasmine Pang, M.D. DATE OF BIRTH:  03/31/1956   DATE OF ADMISSION:  05/28/2008  DATE OF DISCHARGE:                       PSYCHIATRIC ADMISSION ASSESSMENT   IDENTIFYING INFORMATION/JUSTIFICATION FOR ADMISSION AND CARE:  This is a  55 year old male voluntarily admitted on May 28, 2008.   HISTORY OF PRESENT ILLNESS:  The patient presents with a history of  alcohol abuse, he has been drinking a fifth daily, with his last drink 2  days ago, unable to stop on his own.  Referred by the [facility] to  get medically detoxed.  He has been feeling depressed but denies any  suicidal thoughts.  Currently unemployed.  Wife apparently also lost her  job which is adding to depression.  Denies any other substance use.  Denies any hallucinations or seizure activity.   PAST PSYCHIATRIC HISTORY:  The patient was here in 2006 for alcohol  abuse.  No current outpatient mental health services.   SOCIAL HISTORY:  He is a married male.  He lives in [place],  currently unemployed and has a 67 year old son who is stationed in  [place].   FAMILY HISTORY:  With alcohol and depression.   ALCOHOL/DRUG HISTORY:  Again, has been drinking approximately a fifth  daily with the last drink 2 days ago.  Denies any seizure activity or  blackouts.  Denies any other recreational drug use.   PRIMARY CARE Adya Wirz:  Dr. Everardo All.   MEDICAL PROBLEMS:  1. Hypertension.  2. Non-insulin-dependent diabetes.  3. Hyperlipidemia.   MEDICATIONS LISTED:  1. Metformin 500 mg t.i.d.  2. Potassium 20 mEq at bedtime.  3. Simvastatin 80 mg at bedtime.  4. Gabapentin 600 mg t.i.d.  5. Omeprazole 20 mg daily.  6. Trazodone 150 mg at bedtime.   The patient reports compliance with his medications.   DRUG ALLERGIES:  No  known allergies.   PHYSICAL EXAM:  This is a well-nourished middle-aged male, no tremors  noted.  He was fully assessed at [facility] emergency department.  Chart was reviewed with no significant findings.  Temperature of 97.9,  94 heart rate, 20 respirations, blood pressure is 145/86.   LABORATORY DATA:  Shows a blood sugar of 138.  CBC within normal limits.  Alcohol level of less than 5 and a urine drug screen that is negative.   MENTAL STATUS EXAM:  He is fully alert, cooperative, appropriately  dressed, good eye contact.  Speech is soft-spoken, normal pace and tone.  Patient's mood is neutral.  The patient's affect is agreeable, gives  adequate history of circumstances and medical history.  Thought  processes are coherent and goal directed.  Denies any suicidal thoughts.  No delusional statements.  Cognitive function intact.  Memory is intact.  Judgment and insight is good.   AXIS I:  Alcohol dependence, depressive disorder not otherwise  specified.  AXIS II:  Deferred.  AXIS III:  (1)  Hypertension.  (2)  Non-insulin dependent diabetes.  (  3)  Hyperlipidemia.  AXIS IV:  Problems with occupation, economic issues, medical problems.  AXIS V:  Current Global Assessment of Functioning is 35-40.   PLAN:  Contract for safety.  Will resume patient's medications.  We will  check his CBG on a b.i.d. basis.  Will put patient on Librium protocol,  work on relapse prevention.  Will assess his support and further  comorbidities.  The patient will be in the red group.  Case manager will  assess his follow-up rehab.  Tentative length of stay at this time is 3-  4 days.      Landry Corporal, N.P.      Jasmine Pang, M.D.  Electronically Signed    JO/MEDQ  D:  05/31/2008  T:  05/31/2008  Job:  [id number] [id number]

## 2010-08-28 NOTE — Discharge Summary (Signed)
David Lindsey, David Lindsey                          ACCOUNT NO.:  [id number]   MEDICAL RECORD NO.:  [id number]                   PATIENT TYPE:  INP   LOCATION:  0346                                 FACILITY:  [facility]   PHYSICIAN:  Georgann Housekeeper, M.D.                 DATE OF BIRTH:  05-10-1955   DATE OF ADMISSION:  05/18/2002  DATE OF DISCHARGE:  05/21/2002                                 DISCHARGE SUMMARY   DISCHARGE DIAGNOSES:  1. Pancreatitis.  2. Alcoholic hepatitis.  3. Hyponatremia.  4. Hypokalemia.  5. Anemia.  6. Ethyl alcohol abuse.  7. Diabetes.   DISCHARGE MEDICATIONS:  1. Lisinopril/hydrochlorothiazide 20/12.5 mg one daily.  2. Potassium 20 mEq b.i.d.  3. Prevacid 30 mg daily.  4. Glucophage 500 mg t.i.d.  5. Amitriptyline 25 mg q.p.m.  6. Neurontin 300 mg q.p.m.   CONDITION ON DISCHARGE:  Stable.   DISCHARGE INSTRUCTIONS:  1. No ETOH.  2. Parke Simmers diet for next two to three days.   FOLLOWUP:  Follow up with me in two weeks at the office.   HISTORY OF PRESENT ILLNESS:  Please see H&P for details.  The patient is a  55 year old admitted with nausea and vomiting, abdominal pain, with a  history of heavy binge drinking over the weekend.   HOSPITAL COURSE:  Problem 1.  GASTROINTESTINAL:  The patient was admitted  with lipase and amylase consistent with mild pancreatitis.  He had an  ultrasound in July which was negative for gallstones.  His liver function  tests were elevated at this time consistent with alcoholic hepatitis.  The  patient had not had any DTs.  Admitted with IV fluids, n.p.o.  His nausea  and vomiting improved and abdominal pain.  Started on p.o. liquids.  Tolerated that well.  His liver enzymes started to come down.  Initially,  SGOT was 464, SGPT of 354, went down to 135 and 135, respectively.  His  alcohol level when he came in was 172.  As far as his iron studies showed  elevated ferratin and normal total iron binding capacity consistent with  acute phase reactant.  Problem 2.  HYPONATREMIA:  The patient was admitted with a sodium of 117 and  a potassium of 2.9, which was consistent with electrolyte imbalance.  With  IV fluids, was corrected back to normal.  The patient did not have any  seizures.  Problem 3.  ETOH:  He was on withdrawal protocol with phenobarbital.  Did  not have any withdrawal symptoms or any DT's.  Hemodynamically remained  stable.  Problem 4.  ANEMIA:  He had a drop in hemoglobin which was thought to be  secondary to __________ and alcohol.  Stool guaiac was negative, no evidence  of any GI bleed.  His hepatitis B and C was negative.  I will have him  repeat his liver function tests in two weeks.  Instructed  not to have any  alcohol.  Problem 5.  DIABETES:  His sugars remained stable.  Continue on Glucophage.  It was held in the hospital.  Problem 6.  HYPERTENSION:  Remained stable.                                                  Georgann Housekeeper, M.D.    David Lindsey  D:  05/21/2002  T:  05/21/2002  Job:  [id number]

## 2010-08-28 NOTE — Discharge Summary (Signed)
David Lindsey, David Lindsey                          ACCOUNT NO.:  [id number]   MEDICAL RECORD NO.:  [id number]                   PATIENT TYPE:  INP   LOCATION:  0375                                 FACILITY:  [facility]   PHYSICIAN:  Georgann Housekeeper, M.D.                 DATE OF BIRTH:  [birth year]   DATE OF ADMISSION:  08/29/2002  DATE OF DISCHARGE:  08/31/2002                                 DISCHARGE SUMMARY   DISCHARGE DIAGNOSES:  1. Nausea, vomiting, and dehydration.  2. Alcohol abuse.  3. Electrolyte imbalance with hyponatremia and hypokalemia.  4. Renal insufficiency secondary to dehydration.  5. Hypertension.  6. Type 2 diabetes.  7. Esophagitis.   DISCHARGE MEDICATIONS:  1. Glucophage 500 three times a day.  2. Prevacid 30 mg b.i.d.  3. Elavil 25 mg at night.  4. Neurontin 200 mg at night.  5. Lisinopril/hydrochlorothiazide 20/12.5 half tablet daily.  6. Potassium 20 mEq daily.   DIET:  Low sugar.   DISCHARGE INSTRUCTIONS:  No alcohol.   FOLLOW UP:  The patient will follow up with [facility] in two weeks and AA  information.   HISTORY OF PRESENT ILLNESS:  The patient is 55 years old with history of  ETOH abuse.  He had three admissions prior over the last four months.  He  came in with nausea, vomiting, and weakness and falls.  The patient has been  drinking at home and fell down in the bathroom a couple of times.  In the  emergency room, he had significant finding including sodium of 109 with  potassium of 2.9 with creatinine 2.8, BUN 29.  Hemoglobin was 11.4.  The  patient had a head CT that was negative for any bleed.  He was started on IV  fluids to perk his electrolytes.  He did not have any seizures.   HOSPITAL COURSE:  He was admitted to telemetry.  His electrolytes were  corrected.  His liver function, amylase and lipase were unremarkable.  Initially when he came in, his blood pressure was low at 92 systolic with  hydration improved.  With excessive hydration,  his creatinine came down to  normal to 1.7.  His electrolytes, sodium and potassium corrected at the time  of discharge.  Sodium was 132 and potassium was 4.0.  Secondary to his ETOH  abuse, he was on Ativan taper.  He did not have any withdrawals or any DTs.   With regard to his esophagitis, prior to admission he had a GI consult.  Repeat endoscopy showed esophagitis and biopsy was taken to rule out any  underlying pathology.  Pathology was pending.   In regard to his diabetes, his blood sugar remained stable and he did not  require any insulin.  His Glucophage was held secondary to his renal  insufficiency.   In regard to his hypertension, his blood pressure medicine was held and his  blood  pressure remained stable during the hospital course.  The patient will  follow up at [facility] and follow up on the esophageal pathology.                                               Georgann Housekeeper, M.D.    Arliss Journey  D:  09/04/2002  T:  09/04/2002  Job:  [id number]

## 2010-08-28 NOTE — Discharge Summary (Signed)
David Lindsey, David Lindsey                          ACCOUNT NO.:  [id number]   MEDICAL RECORD NO.:  [id number]                   PATIENT TYPE:  INP   LOCATION:  3037                                 FACILITY:  [facility]   PHYSICIAN:  Georgann Housekeeper, M.D.                 DATE OF BIRTH:  1956/01/16   DATE OF ADMISSION:  07/18/2002  DATE OF DISCHARGE:  07/24/2002                                 DISCHARGE SUMMARY   DISCHARGE. DIAGNOSES:  1. Upper gastrointestinal bleed with the findings of no esophagitis, small     Mallory-Weiss tear, antral duodenal deformity with no evidence of recent     bleeding.  2. Ethanol intoxication.  3. Dehydration.  4. Hypertension.  5. Tachycardia.   DISCHARGE MEDICATIONS:  1. Prevacid 30 mg twice a day.  2. Mycelex troches five times a day 10 mg.  3. Lisinopril/hydrochlorothiazide 20/12.5 one a day.  4. Ativan 0.5, taper every two days.  5. Maalox suspension after meals.  6. Neurontin 300 mg three times a day.  7. Glucophage 400 mg two tablets twice a day.  8. Multivitamin.  9. Potassium 20 mEq twice a day.  10.      Amitriptyline 25 mg at night.   ACTIVITY:  As tolerated. Off work until followup next week.   DIET:  Low sugar.   DISCHARGE INSTRUCTIONS:  Instructed to avoid alcohol or alcohol products at  all times. Follow up in one week.   CONDITION ON DISCHARGE:  Stable.   HOSPITAL COURSE:  Please see H&P for details. The patient is 55 years old,  admitted with nausea, vomiting, and abdominal pain with coffee-ground emesis  from the office.   Problem 1. Gastrointestinal. The patient had history of stomach ulcer in the  past. Continues to drink heavy over the weekend and history of alcoholic  abuse in the past. He came in with an upper GI bleed. GI followed. Dr.  Sherin Quarry did endoscopy on 4/8 showing no esophagitis. Biopsies are still  pending. He also had a small Mallory-Weiss tear at the base of the esophagus  and some antral and duodenal  deformities. He was started on IV Protonix. The  patient's hemoglobin was initially 13, dropped down to 11, remained stable  at 11. He had no more further episode of bleeding during the hospital  course. He was started on Mycelex Troches as well as the Protonix. Still  complained of some odynophagia and some burning after eating. No abdominal  pain. Will follow up the biopsies.   Problem 2. Ethanol intoxication. The patient was intoxicated and had put on  Ativan protocol for DT prophylaxis. The patient had not had any DTs in the  hospital. Did have some mild raise in blood pressure and tachycardia, but no  clear evidence of DTs and thought to be secondary to dehydration. His blood  pressure improved, and he has remained mildly tachycardia, although there  were no signs of withdrawal. He was continued on Ativan which will be  tapered. His TSH was normal. UA was negative. He did not have any fever.   Problem 3. Type 2 diabetes. Blood sugar was elevated. Continued on the  insulin sliding scale and restarted back on the Glucophage. The patient was  doing well at home and will continue on the Glucophage four tablets a day.   Problem 4. Neuropathy in the legs. Continue on Neurontin and amitriptyline.  He did have some pain but overall was stable. As far as the tachycardia, he  had EKG done which showed normal sinus rhythm. His saturations were okay. He  had normal TSH, and UA was negative. No fever. Initially, when he came in,  he was in the 130s. He did come down to 100s. He had no chest pains. Will  follow up as outpatient. Again, instructed to avoid alcohol at all cost.   LABORATORY DATA:  His other blood work, when he came in, he had mild renal  insufficiency secondary to dehydration and prerenal azotemia which  corrected. Creatinine was down from 1.9 to 1.1. Also his lab work remained  stable. He did require some potassium supplements, and his electrolytes  remained stable. He had mild  elevation of LFTs thought to be secondary to  his alcohol.   FOLLOW UP:  Follow up in one week at the clinic as well as GI followup.                                               Georgann Housekeeper, M.D.    KH/MEDQ  D:  07/24/2002  T:  07/24/2002  Job:  [id number]   cc:   Tasia Catchings, M.D.  [address]  [place]  [place] [address]  Fax: [phone]

## 2010-08-28 NOTE — Consult Note (Signed)
NAMEHOYLE, BARKDULL              ACCOUNT NO.:  [id number]   MEDICAL RECORD NO.:  [id number]          PATIENT TYPE:  INP   LOCATION:  0103                         FACILITY:  [facility]   PHYSICIAN:  Althea Grimmer. Santogade, M.D.DATE OF BIRTH:  [birth year]   DATE OF CONSULTATION:  08/19/2004  DATE OF DISCHARGE:                                   CONSULTATION   Mr. Mcburney is a 55 year old male alcohol abuser whom I am asked to see for  hematemesis. He has ongoing problems with alcohol abuse and his ethanol  level in the emergency room today is 278. He says he drank a pint of scotch  yesterday, he began vomiting last night and vomited some blood last night  and twice this morning. Nevertheless, his hemoglobin is 15.6 and he is not  orthostatic at present. To me, he denies melena or hematochezia or abdominal  pain. He has presented similarly in the past and had two upper endoscopies,  the records of which I can find. He had severe esophagitis on one occasion  and a Mallory-Weiss tear on the other. In addition, he had a negative  colonoscopy in 2000 plus he has a family history of colon cancer in his  mother. Currently, he denies dysphagia, abdominal pain, melena or  hematochezia. He is somewhat tremulous and short of breath in the emergency  room.   PAST MEDICAL HISTORY:  Pertinent for alcohol abuse, esophagitis controlled  with Prevacid, hypertension, diabetes mellitus type 2.   CURRENT MEDICATIONS:  Mevacor, Zestoretic, Metformin, Prevacid, Kay Ciel and  Neurontin for diabetic neuropathy.   FAMILY HISTORY:  Notable for colon cancer in his mother and large polyps in  his brother.   SOCIAL HISTORY:  Persisting alcohol abuser, married.   REVIEW OF SYMPTOMS:  GENERAL:  No weight loss or night sweats. ENDOCRINE:  Diabetes type 2 with neuropathy. No known thyroid problems. SKIN:  No rash  or pruritus. EYES:  No icterus or change in vision. ENT:  No aphthous ulcers  or chronic sore throat.  RESPIRATORY:  Currently mild short of breath, not  coughing. CARDIAC:  No chest pain, palpitations or history of valvular heart  disease. GI:  As above. GI:  No dysuria or hematuria.   PHYSICAL EXAMINATION:  GENERAL:  He is an overweight somewhat anxious adult  male in no acute distress.  VITAL SIGNS:  Afebrile. Blood pressure 126/90, pulse 118.  SKIN:  Normal.  HEENT:  Eyes anicteric. Oropharynx unremarkable.  NECK:  Supple without thyromegaly or adenopathy.  CHEST:  Sounds clear.  HEART:  Sounds mildly tachycardic.  ABDOMEN:  Soft with active bowel sounds and without mass, tenderness or  organomegaly. He is obese.  RECTAL:  Not performed.  EXTREMITIES:  Without edema.   LABORATORY DATA:  Hemoglobin 15.6, white blood cell count 12.6, platelet  count 253. Prothrombin time normal at 11.4. BUN 25, creatinine 1.4, ammonia  37. Alcohol level 278, lipase 46.   IMPRESSION:  A 55 year old male alcohol abuser with probable self limited  upper GI bleed. I suspect this is alcoholic gastritis; however, a repeat  Mallory-Weiss tear or  esophagitis could be the etiology. Nevertheless, since  his hemoglobin currently is normal and he is not orthostatic and he seems to  be having some symptoms of withdrawal, I do not think he should be  endoscoped immediately. I would prefer to stabilize him overnight and check  in the morning. This is explained to the patient and the family.   PLAN:  Continue b.i.d. IV Protonix as you have ordered. I would put the  patient on clear liquids and make him n.p.o. after midnight in preparation  for the endoscopy which is reviewed with the patient in terms of technique,  preparation and risk of complications including bleeding and perforation. He  agrees to proceed. Serial hemoglobins are already ordered and we will keep  an eye on these. Observe the patient for signs of worsening withdrawal.      PJS/MEDQ  D:  08/19/2004  T:  08/19/2004  Job:  [id number]   cc:    Rene Paci, M.D. [facility]  [address] [place], [place] [address]

## 2010-08-28 NOTE — H&P (Signed)
David Lindsey, David Lindsey NO.:  [id number]   MEDICAL RECORD NO.:  [id number]                   PATIENT TYPE:  INP   LOCATION:  0346                                 [facility]   PHYSICIAN:  Candyce Churn, M.D.          DATE OF BIRTH:  [birth year]   DATE OF ADMISSION:  05/18/2002  DATE OF DISCHARGE:                                HISTORY & PHYSICAL   CHIEF COMPLAINT:  Nausea, vomiting and abdominal pain.   HISTORY OF PRESENT ILLNESS:  David Lindsey is a 55 year old male with a  history of 1) type 2 diabetes mellitus, 2) diabetic neuropathy, 3)  hypertension, 4) alcohol abuse for years in the form if heavy binge  drinking. It has been quite heavy for 4-5 years. 5) Question of alcohol  withdrawal.   He presents with a one week history of progressive abdominal pain, nausea  and vomiting. He has been drinking approximately a fifth of alcohol every  other day recently. He was found to have elevated lipase and LFTs in the  emergency room. He has had quick shaking when stopping alcohol in the past.  He has never had a history of admissions for alcohol related illness. He has  never been to a detox center for alcohol. He is currently symptomatically  better with IV fluids and IV Phenergan.   PAST MEDICAL HISTORY:  Peptic ulcer disease. Gastric and pyloric--endoscoped  by Dr. Charlotte Sanes in August of 2000. Otherwise as above in HPI.   PAST SURGICAL HISTORY:  Appendectomy.   MEDICATIONS:  1. Lisinopril HCTZ 20/12.5 one daily.  2. Potassium chloride 20 mEq p.o. b.i.d.  3. Prevacid 30 mg daily.  4. Glucophage 500 t.i.d. daily.  5. Elavil 25 mg p.o. q.h.s.  6. Neurontin 200 mg at q.h.s.   FAMILY HISTORY:  Positive for colon cancer in his 47 year old mother. It  appears that he had a colonoscopy in August of 2000.   SOCIAL HISTORY:  No tobacco use, positive alcohol use as above. He is  married and his wife is present. He works in aluminum  processing. He has  been out of work since January 30 secondary to current illness.   REVIEW OF SYMPTOMS:  He has had some red blood with a bowel movement two  weeks ago. No chest pain, shortness of breath or headache. No melena. He  thinks that he does have a depressed mood and that is one reason that he  drinks. He is quiet when he drinks and nonbelligerent.   PHYSICAL EXAMINATION:  GENERAL:  Moderately obese male. He is mildly sleepy  but has had IV Phenergan.  VITAL SIGNS:  Blood pressure 129/85, pulse 100 and regular, respirations 20  and easy, temperature 98.3, O2 saturation was normal in the emergency room.  CBG is 140.  HEENT:  Sclerae are nonicteric. Ketones on breath. Neck is supple.  Oropharynx dry.  CHEST:  Clear.  CARDIAC:  Regular rate and rhythm without murmur or gallop.  ABDOMEN:  Soft. No __________ rebound diffusely. Bowel sounds are present.  Liver is enlarged. Could not tell if the spleen was enlarged. He is  nondistended.  EXTREMITIES:  Without cyanosis, clubbing or edema.  NEUROLOGIC:  Oriented x3. Cranial nerves are okay. Moves all extremities  well. No tremor.   LABORATORY DATA:  White count is 6100, hemoglobin 13.4, RDW is 12.5, MCV is  normal at 91. Platelet count 161,000, pro time 11.8 seconds, PTT 29 seconds.  Sodium 117, potassium is 2.9, chloride 76, bicarb 19. BUN 11, creatinine  0.9, blood sugar 165, total bilirubin 1.4, LFTs remarkable for normal  alkaline phosphatase of 89 but SGOT is 464, SGPT is 354. Albumin is normal  at 4.5, total protein normal at 8.2. Lipase is mildly elevated at 81 normal  being 22 to 51. Do not have an amylase. Alcohol level is 172.   Abdominal ultrasound from July 2003 revealed no gallstones and a fatty liver  and the spleen appeared normal size.   ASSESSMENT:  A 54 year old male whose been drinking for years and now has  mild pancreatitis and alcoholic hepatitis likely. Also seems depressed and  he agrees. He has poor  IV access and he may be medically noncompliant.   1. Alcohol abuse --treat with phenobarbital, withdrawal protocol. Get     counseling.  2. Hepatitis/pancreatitis--sips of clear liquids. Treat with IV fluids and     Phenergan. Check hepatitis B surface antigen, hepatitis C iron binding     capacity. IV Demerol for pain. Follow LFTs.  3. Hypertension--resume medications as blood pressure rises.  4. Diabetes mellitus--hold Glucophage--use sliding scale insulin.  5. Neuropathy--continue Elavil and Neurontin.  6. History of peptic ulcer disease--treat with IV Protonix.  7. Some red blood with bowel movement a couple of weeks ago-heme stools x3.  8. Start SSRI soon for depressed mood--will try Prozac 20 mg daily.   For hyponatremia, hypochloremia and hypokalemia will treat with normal  saline with 20 KCl/liter at 150 an hour. He does have metabolic acidosis  which is likely secondary to alcoholic ketosis. I do not think that he has a  diabetic ketoacidosis.   The patient understands plans for care and was agreeable to plans as  outlined.                                                Candyce Churn, M.D.    RNG/MEDQ  D:  05/19/2002  T:  05/19/2002  Job:  [id number]   cc:   Bernette Redbird, M.D.  [address], [address]  [place], [place] [address]  Fax: [phone]   Georgann Housekeeper, M.D.  [address]  [place]  [place] [address]  Fax: [phone]

## 2010-08-28 NOTE — H&P (Signed)
NAMEMARLEE, Lindsey NO.:  [id number]   MEDICAL RECORD NO.:  [id number]          PATIENT TYPE:  IPS   LOCATION:  0306                          [facility]ACILITY[facility]  [facility]   PHYSICIAN:  Jeanice Lim, M.D. DATE OF BIRTH:  [birth year]   DATE OF ADMISSION:  09/10/2004  DATE OF DISCHARGE:                         PSYCHIATRIC ADMISSION ASSESSMENT   IDENTIFYING INFORMATION:  A 55 year old married African-American male  voluntarily admitted on September 10, 2004.   HISTORY OF PRESENT ILLNESS:  The patient presents with a history of alcohol  abuse, has been drinking a fifth of scotch daily.  States his drinking has  been escalating over the past month.  He relapsed about 2 months ago.  His  wife wanted him to get help.  He has not been going to work.  His last drink  was on Sep 09, 2004.  He denies any depression, suicide ideation or  psychotic symptoms.  The patient states that drinking helps him sleep.  His  appetite has been satisfactory.  He reports a history of drinking for years.   PAST PSYCHIATRIC HISTORY:  First admission to [facility], two  years ago was hospitalized for detox.  Will be going to the [facility] as  an outpatient.  No history of a suicide attempt.   SOCIAL HISTORY:  He is a 55 year old married African-American male, married  for 18 years, has a 87 year old child.  He lives with his wife.  He works at  [employer].  No legal charges.   FAMILY HISTORY:  Denies.   ALCOHOL DRUG HISTORY:  The patient is a nonsmoker, has been drinking scotch,  drinks in the morning, denies ever drinking at work, denies any drug use,  seizures, or DTs.   PAST MEDICAL HISTORY:  Primary care Jaison Petraglia is Dr. Everardo All in [place].  Medical problems are hypertension, non-insulin-dependent diabetes mellitus,  diabetic neuropathy.   MEDICATIONS:  Metformin ER 500 mg 2 b.i.d., Neurontin 300 mg 1 q.i.d., Actos  45 mg 1 daily, Prevacid 30 mg daily, Klor-Con 20 mEq  daily, Lisinopril with  hydrochlorothiazide 20/25 daily.  The patient gets his medications at [facility] on [address].   DRUG ALLERGIES:  No known allergies.   PHYSICAL EXAMINATION:  The patient was assessed at [facility] where he  received Geodon 20 mg IM and a GI cocktail.  The patient does seem to be  having some discomfort this morning, feeling kind of restless and shaky.  Temperature is 97.8, 97 heart rate, 20 respirations, blood pressure is  148/97.  Urine drug screen is negative.  BMET was within normal limits.  Alcohol level was 304.  Platelet count was 247.  White count was 11.4.  SGOT  was 38.   MENTAL STATUS EXAM:  He is a fully alert, cooperative male, little eye  contact, seems uncomfortable.  Speech is clear, normal pace and tone.  The  patient feels very rough this morning.  The patient is in the bed, again  kind of looking restless and uncomfortable.  Thought processes are coherent,  no evidence of psychosis.  Cognitive function intact.  Memory is fair,  judgment and insight is fair, poor impulse control.   ADMISSION DIAGNOSES:  AXIS I:  Alcohol dependence.  AXIS II:  Deferred.  AXIS III:  Hypertension, diabetic neuropathy, non-insulin-dependent diabetes  mellitus, and gastroesophageal reflux disease.  AXIS IV:  Problems with other psychosocial problems, medical problems,  occupation.  AXIS V:  Current is 35, past year 32.   PLAN:  Plan is to detox patient, work on relapse prevention, follow up with  AA.  The patient is to remain alcohol free.  Will monitor his blood sugars  while he is here.  Will have a family session with his wife.   TENTATIVE LENGTH OF CARE:  4-5 days.       JO/MEDQ  D:  09/13/2004  T:  09/13/2004  Job:  [id number]

## 2010-08-28 NOTE — H&P (Signed)
David Lindsey, David Lindsey                          ACCOUNT NO.:  [id number]   MEDICAL RECORD NO.:  [id number]                   PATIENT TYPE:  INP   LOCATION:                                       FACILITY:  [facility]   PHYSICIAN:  David Lindsey, M.D.              DATE OF BIRTH:  [birth year]   DATE OF ADMISSION:  07/18/2002  DATE OF DISCHARGE:                                HISTORY & PHYSICAL   CHIEF COMPLAINT:  Nausea and vomiting.   HISTORY OF PRESENT ILLNESS:  The patient is a 55 year old black male, a  patient of Dr. Jerelyn Scott Lindsey's, who has been experiencing nausea and  vomiting for three days.  Wife states symptoms began after the death of a  family member over the weekend.  She states that the patient has been alone  in the bedroom and not eating since then.  The patient had a fifth of  liquor over the weekend and has had an alcohol problem for years.  He states  he has vomited several dark emeses since his symptoms began, but stools have  been fairly normal and without dark color or diarrhea.  The patient has  epigastric abdominal pain and is weak.  He has had only two glasses of water  in the last 24 hours.  In the office, he was found to be listless, vomiting  coffee-grounds emesis and complaining of abdominal pain.  He is being  admitted for the same.   REVIEW OF SYSTEMS:  No fever.  No chest pain or shortness of breath.  ABDOMEN:  As above.  GU:  Denies problems.  EXTREMITIES:  No edema.  NEUROLOGIC:  Weak, but no numbness, tingling or headache.   PAST MEDICAL HISTORY:  1. ETOH abuse with LFT increase.  2. Peptic ulcer disease, followed by Dr. Bernette Lindsey, endoscopy and     colonoscopy in 2000.  3. Diabetes mellitus.  4. Anemia.  5. Hypertension.  6. Pancreatitis.  7. Neuropathy.  8. Depression, recent.   PAST SURGICAL HISTORY:  Appendectomy.   FAMILY HISTORY:  Family history positive for colon cancer in his 50 year old  mother.   SOCIAL HISTORY:  Is married.   Does drink alcohol, binge drinking; states he  drink one fifth of liquor on weekends.  Denies any nonsteroidal anti-  inflammatory or aspirin use.   PHYSICAL EXAMINATION:  VITAL SIGNS:  Temperature is 99, pulse 72,  respirations 22, BP lying 130/70, too weak to check orthostatic blood  pressure.  GENERAL:  Awake but somewhat somnolent.  He answers questions appropriately.  Moaning in pain.  HEENT:  PERRLA.  Ear, nose and throat are unremarkable.  NECK:  No JVD.  No adenopathy.  HEART:  Normal S1 and S2 without murmur auscultated.  LUNGS:  Lungs are clear to auscultation.  ABDOMEN:  Abdomen is protuberant.  There is pain to palpation in the  epigastric area.  RECTAL:  Stools are brown and heme-positive.  EXTREMITIES:  No edema.  NEUROLOGIC:  Weak.  No focal deficits.   LABORATORY DATA:  Hemoglobin 15.4, hematocrit 43.9, MCV 85.8, WBC 8800 with  81% neutrophils.  Chemistry test reveals a glucose of 169, BUN 45,  creatinine 1.5, sodium 128, potassium 3.9, chloride 84,  CO2 15, calcium 9,  protein total 8.2, albumin 3.9, total bilirubin 0.5, alkaline phosphatase  88, SGOT 129, SGPT 123.   IMPRESSION:  1. Gastrointestinal bleed.  2. Alcohol abuse.   PLAN:  Admit, IV hydration, GI to see.                                               David Lindsey, M.D.    HTS/MEDQ  D:  07/18/2002  T:  07/19/2002  Job:  [id number]

## 2010-08-28 NOTE — Discharge Summary (Signed)
NAMEABDULLOH, ULLOM NO.:  [id number]   MEDICAL RECORD NO.:  [id number]          PATIENT TYPE:  IPS   LOCATION:  0306                          [facility]   PHYSICIAN:  Jeanice Lim, M.D. DATE OF BIRTH:  11-19-1955   DATE OF ADMISSION:  09/10/2004  DATE OF DISCHARGE:  09/14/2004                                 DISCHARGE SUMMARY   IDENTIFYING DATA:  This is a 55 year old married African-American male with  a history of alcohol abuse drinking a fifth of scotch per day which has been  escalating for a month prior to admission.  He relapsed 2 months ago.  Wife  wanted him to get help.  He was not going to work.  Last drinking Sep 09, 2004.  He denied suicidal ideations, psychosis or depressive symptoms.  [facility] admission 2 years ago.  Hospitalized and  detoxed has been a [facility].   MEDICAL PROBLEMS:  Hypertension.  Non-insulin-dependent diabetes mellitus  and diabetic neuropathy.   MEDICATIONS:  Metformin.  Neurontin.  Potassium.  Lisinopril.  Lovastatin.  Prevacid.   ALLERGIES:  No known drug allergies.   PHYSICAL EXAMINATION:  His physical and neurologic exam within normal  limits, as performed in the emergency department.   MENTAL STATUS EXAM:  Fully alert and cooperative, poor eye contact.  Seeming  somewhat uncomfortable with tremulousness.  Speech was mostly clear,  anxious.  Mood dysphoric.  Felling rough with positive withdrawal  symptoms.  Thought process goal directed.  No psychotic symptoms.  No  dangerous ideations.  Judgment and insight are fair.  Patient reported  motivation to detox and then remain abstinent from alcohol.   ADMISSION DIAGNOSES:   AXIS I:  Alcohol dependence and withdrawal syndrome requiring inpatient safe  detox.   AXIS II:  Deferred.   AXIS III:  1.  History of hypertension.  2.  Diabetic neuropathy.  3.  Non-insulin-dependent diabetes mellitus.   AXIS IV:  Moderate stressors  related to occupation and psychosocial issues,  medical problems.   AXIS V:  35/60.   HOSPITAL COURSE:  Patient was admitted, ordered routine p.r.n. medications,  underwent further monitoring, was encouraged to participate in individual,  group and milieu therapy including substance abuse treatment.  Patient was  voluntary __________ .  Placed in detox protocol, family assessed and  answered questions.  Patient worked in developing relapse prevention plan.  Patient was continued with medical monitoring and medical medications in  addition to detox which he tolerated.  He was discharged in improved  condition reporting wife and church were support system, and he planned to  go to [facility] following discharge.  He was discharged with no acute  withdrawal symptoms, no dangerous ideation, motivated to work his relapse  prevention plan.  He had medication education and was discharged to follow  up with his primary care physician regarding his medical problems, to avoid  alcohol and to continue Protonix 40 mg in the morning and Actos 15 mg in the  morning, Prinivil 20 and hydrochlorothiazide 20/25 or Zestril in the morning  and  Neurontin 300 mg t.i.d. and 2 at 9:00 p.m., trazodone 50 mg at 9:00 p.m.  with __________ XR 500 mg  2 b.i.d.   FOLLOW UP:  [facility]ivian[facility] on Tuesday, September 15, 2004, at 11:00 a.m.   DISCHARGE DIAGNOSES:   AXIS I:  Alcohol dependence and withdrawal syndrome requiring inpatient safe  detox.   AXIS II:  Deferred.   AXIS III:  1.  History of hypertension.  2.  Diabetic neuropathy.  3.  Non-insulin-dependent diabetes mellitus.   AXIS IV:  Moderate stressors related to occupation and psychosocial issues,  medical problems.   AXIS V:  Global assessment of function on discharge 55-60.       JEM/MEDQ  D:  10/18/2004  T:  10/18/2004  Job:  [id number]

## 2010-08-28 NOTE — Consult Note (Signed)
NAMESTAR, CHEESE NO.:  [id number]   MEDICAL RECORD NO.:  [id number]          PATIENT TYPE:  INP   LOCATION:  1432                         FACILITY:  [facility]   PHYSICIAN:  Antonietta Breach, M.D.  DATE OF BIRTH:  [birth year]   DATE OF CONSULTATION:  03/09/2006  DATE OF DISCHARGE:                                 CONSULTATION   REASON FOR CONSULTATION:  Depressed mood, alcohol dependence.   HISTORY OF PRESENT ILLNESS:  Mr. Champlain is a 55 year old married male,  admitted to the [facility] on March 08, 2006,  with nausea and vomiting.  Mr. Delancey was feeling very weak for two  days, after he began to have severe sweats, tremor, palpitations and  nausea and vomiting.  He had resumed drinking on March 03, 2006, and  was drinking at least a fifth of liquor per day with beer on top of  that.  He began to experience the shame of relapse and a sense of  hopelessness as he could not stop the alcohol pattern.   Today he is no longer having any tremor.  His sweats have resolved.  His  nausea is 90% resolved, and he is having no vomiting.  He has hope  returned now and has been working on his 12 steps.  He has no depressed  mood.  He has no thoughts of harming himself, no thoughts of harming  others, no delusions, no hallucinations.  He is socially appropriate and  cooperative.  He has normal interests and has a desire to continue his  recovery program.   PAST PSYCHIATRIC HISTORY:  Mr. Seales was admitted to the [facility] in June 2006, after several weeks of drinking a  fifth of liquor and beer.  He underwent an alcohol detoxification  protocol and was discharged to a relapse prevention program at the  [facility] in town.  He followed that up with 12-step meetings for  several weeks and remained abstinent until March 03, 2006.  He has no  history of other psychiatric illnesses.  He has not been on any regular  psychotropic medication.   FAMILY PSYCHIATRIC HISTORY:  None known.   SOCIAL HISTORY:  Mr. Demuro is from [place], [place].  He  works here in town at [employer].  He has no history of legal  charges.  He has been married for 19 years, in a very supportive  marriage.  He has a 42 year old son who is in the [employer].  He does not  do any illegal drugs.  His religion is [employer] and instrumentation [employer].  He resides with his  wife in town.   GENERAL MEDICAL PROBLEMS:  Nausea and vomiting, secondary to a reactive  gastroenteritis.   MEDICATIONS:  The MAR is reviewed.  The patient is not on any  psychotropics at this time, other than Desyrel 50 mg q.h.s.   ALLERGIES:  No known drug allergies.   LABORATORY DATA:  WBC 5.6, hemoglobin 12.7, platelets 163.  The  metabolic panel shows an elevated glucose of 196, BUN 13, creatinine  1.1.  SGOT 124, SGPT 111, albumin  3.8, calcium 8.8.  TSH within normal  limits at 0.7.   REVIEW OF SYSTEMS:  CONSTITUTIONAL:  Afebrile.  HEENT:  Head:  No  trauma.  Eyes:  No visual changes.  Ears:  No hearing impairment.  Nose:  No rhinorrhea.  Mouth/Throat:  No sore throat.  NEUROLOGIC:  Unremarkable.  PSYCHIATRIC:  The patient does have a history of taking  trazodone regularly for insomnia.  He has some excess worry and some  muscle tension, and there is evidence that part of his impetus to drink  has been for self-medication of anxiety.  GASTROINTESTINAL:  As above.  GENITOURINARY:  No dysuria.  CARDIOVASCULAR:  No chest pain,  palpitations or edema.  RESPIRATORY:  No coughing or wheezing.  SKIN:  Unremarkable.  MUSCULOSKELETAL:  No deformities.  HEMATOLOGICAL/LYMPHATICS:  Slight anemia.  ENDOCRINE/METABOLIC:  Hyperglycemia, as mentioned above.   PHYSICAL EXAMINATION:  VITAL SIGNS:  Temperature 97.5 degrees, pulse 71,  respirations 20, blood pressure 106/71, O2 saturation on room air 98%.  MENTAL STATUS:  Mr. Kellogg is a middle-aged male, sitting up in a  chair,  appearing his stated age and well-groomed.  He is alert with good  eye contact.  His mood is within normal limits.  His affect is slightly  anxious at baseline with a broad appropriate response.  He is oriented  to all spheres.  His memory is intact to immediate, recent and remote.  His concentration is within normal limits.  His psychomotor tone is  within normal limits.  He has no tremor on hand extension.  His fund of  knowledge and intelligence are within normal limits.  His speech  involves normal rate and prosody, without dysarthria.  Thought process  logical, coherent and goal-directed.  No looseness of associations.  Thought content:  No thoughts of harming himself.  No thoughts of  harming others.  No delusions, no hallucinations.  His insight is good.  His judgment is intact.   ASSESSMENT:  AXIS I:  1.  Code 293.84 - Anxiety disorder, not otherwise  specified.  1. Alcohol dependence with phsyiologic dependence.    AXIS II:  Deferred.  AXIS III:  See general medical problems.  AXIS IV:  Primary support group.    AXIS V:  55.   Mr. Dapolito is not at risk to harm himself or others.  He agrees to call  Emergency Services immediately for any emergency psychiatric symptoms.   The indications, alternatives and adverse effects of trazodone were  discussed with the patient for anti-anxiety and anti-insomnia, including  the risk of priapism and possible surgery with induced impotence.  He  understands the above information and wants to continue with trazodone.  The patient agrees to not drive if drowsy.   The undersigned recommended an inpatient CD rehabilitation program for  highly intensive therapy, in order to further improve his relapse  prevention skills; however, the patient declined and wants to get back  to his job.  He points out that the time away from work would present  more stress than it would be worth.  He wants to pursue the outpatient CD intensive program that he  did before and 12-step groups.   RECOMMENDATIONS:  1. Would ask the case manager to set this patient up with a CD IOP at      one of the local programs at [facility], [facility] or [facility].  Private programs  are also available.  2. Would also ask that the patient be set up for anti-anxiety      psychotherapy, in combination with psychotropic medication      management.  The trazodone does have serotonin re-uptake      inhibition, and therefore can be continued with the psychotherapy      for anti-anxiety and anti-insomnia.  Other medicines with SRI could      be considered, if needed.  3. The patient will continue with the 12-step program.   The undersigned provided extensive ego-supportive psychotherapy,  reinforcement of the 12-step principals and education.      Antonietta Breach, M.D.  Electronically Signed     JW/MEDQ  D:  03/09/2006  T:  03/09/2006  Job:  [phone]

## 2010-08-28 NOTE — Discharge Summary (Signed)
David Lindsey, David Lindsey              ACCOUNT NO.:  [id number]   MEDICAL RECORD NO.:  [id number]          PATIENT TYPE:  IPS   LOCATION:  0306                          [facility]ACILITY:  [facility]   PHYSICIAN:  Jasmine Pang, M.D. DATE OF BIRTH:  Aug 09, 1955   DATE OF ADMISSION:  05/28/2008  DATE OF DISCHARGE:  05/31/2008                               DISCHARGE SUMMARY   HISTORY OF PRESENT ILLNESS:  The patient presents with a history of  alcohol abuse.  He has been drinking a fifth daily with his last drink 2  days ago.  He has been unable to stop on his own.  He was referred by  [facility] to get medically detox.  He has been feeling depressed,  but denies any suicidal thoughts.  He is currently unemployed.  His wife  apparently also lost her job, which is adding to his depression.  He  denies any other substance use.  He denies any hallucinations or seizure  activities.   PAST PSYCHIATRIC HISTORY:  The patient was here in 2006 for alcohol  abuse.  No current outpatient mental health services.   FAMILY HISTORY:  There is a family history of alcohol abuse and  depression.   ALCOHOL AND DRUG HISTORY:  Again, he has been drinking approximately a  fifth daily with the last drink 2 days ago.  He denies any seizure  activity or blackouts.  He denies any other recreational drug use.   MEDICAL PROBLEMS:  Hypertension, non-insulin-dependent diabetes, and  hyperlipidemia.   MEDICATIONS:  1. Metformin 500 mg t.i.d.  2. Potassium 20 mEq at bedtime.  3. Simvastatin 80 mg at bedtime.  4. Gabapentin 600 mg t.i.d.  5. Omeprazole 20 mg daily.  6. Trazodone 150 mg at bedtime.   The patient reports compliance with his medications.   DRUG ALLERGIES:  No known drug allergies.   PHYSICAL FINDINGS:  This is a well-nourished, middle-aged male, who was  fully assessed at the [facility] Emergency Department, they found no  acute medical or physical problems.   LABORATORY DATA:  Shows a blood sugar of  138.  CBC was within normal  limits.  Alcohol level was less than 5.  An urine drug screen was  negative.   HOSPITAL COURSE:  The patient was started on Librium 25 mg p.o. call q.4  hours, p.r.n. symptoms of withdrawal.  He was also started on Librium  detox protocol.  Metformin 1000 mg b.i.d., Klor-Con 20 mEq daily, Actos  30 mg daily, simvastatin 80 mg p.o. q.h.s., trazodone 150 mg p.o.  q.h.s., p.r.n. insomnia.  Gabapentin 600 mg p.o. t.i.d., aspirin 81 mg  p.o. daily.  On May 29, 2008, instead of the p.r.n. Librium.  He  was started on the Librium detox protocol.  In individual sessions, the  patient was initially depressed and he states he was getting sick when  he ran out of alcohol.  He denied any suicidal ideations.  There was no  evidence of psychosis or thought disorder.  He did state he had  diabetes, hypertension, neuropathy in the lower extremities and  dyslipidemia.  On May 30, 2008, mental status had improved sleep  was good appetite was good.  Mood, he stated he felt better than I  did, but he was still somewhat anxious.  He was tolerating his detox  protocol well.  He still felt somewhat tremulous and anxious due to  withdrawal and a family session scheduled with his wife.  She was very  supported, but stated that the patient has had difficult time telling  people that he is trying to stop drinking more, tell them know.  The  patient is suggested that he stay away from the people drinking, but  wife stated that this is not realistic since her family drinks as well  as peers and his colleagues all of them.  She suggested he to be  stronger and letting them know.  Wife talked about the patient isolating  too much and she was concerned about that.  He has had a good discharge  plan.  He wanted to go back to [facility].  His wife was very  supportive and felt good about him being discharged, but hopes he has  got to the point that he can admit that he has a  problem to other  people.  On May 31, 2008, sleep was good appetite was good.  Mood  was less depressed, less anxious.  Affect was consistent with mood.  There was no suicidal or homicidal ideation as well as self-injurious  behavior.  No auditory or visual hallucinations.  No paranoia or  delusions.  Thoughts were logical and goal-directed.  Thought content,  no predominant theme.  Cognitive was grossly intact.  Insight good.  Judgment good.  Impulse control good.  He was having no withdrawal  symptoms.  He wanted to go home today and was felt be safe for  discharge.  His wife agreed that he was ready to come home.   DISCHARGE DIAGNOSES:  Axis I:  Depressive disorder, not otherwise  specified and also alcohol dependence.  Axis II:  None.  Axis III:  Hypertension, non-insulin-dependent diabetes mellitus,  hyperlipidemia.  Neuropathy of lower extremities.  Axis IV: Severe (problems with occupation, economic issues, medical  problems, burden of chemical dependence, and burden of psychiatric  illness).  Axis V:  Global assessment of functioning was 50 upon discharge.  GAF  was 35 to 40 upon admission.  GAF highest past year was 60 to 65.   DISCHARGE PLAN:  There was no specific activity level or dietary  restriction.   POSTHOSPITAL CARE PLANS:  The patient will go to the [facility] on  June 03, 2008, at 9 a.m.   DISCHARGE MEDICATIONS:  Metformin 1000 mg twice a day.      Jasmine Pang, M.D.  Electronically Signed     BHS/MEDQ  D:  06/21/2008  T:  06/22/2008  Job:  [id number]

## 2010-08-28 NOTE — H&P (Signed)
NAMEELIESER, TETRICK                          ACCOUNT NO.:  [id number]   MEDICAL RECORD NO.:  [id number]                   PATIENT TYPE:  INP   LOCATION:  0101                                 FACILITY:  [facility]   PHYSICIAN:  Marcene Duos, M.D.         DATE OF BIRTH:  02/05/1956   DATE OF ADMISSION:  08/29/2002  DATE OF DISCHARGE:                                HISTORY & PHYSICAL   CHIEF COMPLAINT:  Nausea, vomiting, with repeat falls and lack of  equilibrium.   HISTORY OF PRESENT ILLNESS:  This is a 55 year old male, patient of Dr.  Georgann Housekeeper, with recent hospitalization and history of alcoholism, non-  insulin-dependent diabetes mellitus, peptic ulcer disease, recent Mallory-  Weiss tear with upper GI bleed, who presented to the emergency room at  [facility] with a several-day history of nausea, epigastric discomfort  leading him to induced vomiting recurrently.  Patient possibly with some  diarrhea and possibly with at least one melanotic stool about one week ago.  Denies hematemesis.  Over the weekend he began having difficulties with  equilibrium with multiple falls and apparently struck his head at least  twice in the bathtub, one episode where he collapsed as well and had what  appeared to be a short-term loss of consciousness with shaking all over,  according to his wife.  He was, however, easily arousable within just a  couple of seconds of passing out.  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  He denies any suicidal  ideation.  He has been drinking a fifth of liquor generally each week and  over the weekends another fifth.  Last drink, he states, was three days ago.  He has also been using Motrin for his headache after the fall.  He has had  no fever, cough, congestion,  sore throat, chest pain.  In the emergency room  a CT of his head was negative for bleeder injury.  Hemoglobin 10.7 with a  white count of 8.4, platelets 317.  Sodium quite low at 109, potassium low  at 2.9, bicarbonate 24, sugar was 179, creatinine 2.8, and BUN 29.  UA with  specific gravity of 1.006.   PAST MEDICAL HISTORY:  1. Non-insulin-dependent diabetes mellitus.  2. Diabetic nephropathy.  3. Alcohol abuse.  4. Mallory-Weiss tear April 2004, status post EGD with that.  5. GERD.  6. History of peptic ulcer disease.  7. Hypertension.   PAST SURGICAL HISTORY:  None.   CURRENT MEDICATIONS:  1. Zantac 300 mg p.o. q.h.s.  2. Prilosec 30 mg p.o. daily.  3. Lisinopril/hydrochlorothiazide 20/25 mg p.o. daily.  4. Klor-Con 20 mEq p.o. b.i.d.  5. Neurontin 300 mg p.o. q.i.d.  6. Metformin 500 mg three times  daily.   ALLERGIES:  No known drug allergies.   TOBACCO/ALCOHOL:  Tobacco:  No use.  Alcohol:  Abuse as noted.   SOCIAL HISTORY:  The patient is married for 15 years.  He previously worked  as a Pensions consultant in Designer, fashion/clothing.  I could not get him to hear so I could be any  more specific than that.   FAMILY HISTORY:  Father age 33, with diabetes mellitus, hypertension.  Mother died at age 19, colon cancer.  Three brothers, he believes are  healthy.  One sister died at age 72 following a head injury.  One child is  30 and healthy.   REVIEW OF SYSTEMS:  Some bright red blood per rectum just on the tissue when  wiping.  No definite melena though he may have had a dark diarrheal stool  about a week ago.   PHYSICAL EXAMINATION:  VITAL SIGNS:  Temperature 96.6, pulse 88,  respirations 18, blood pressure 103/61.  HEENT:  No obvious trauma.  Pupils are equal, round, and reactive to light.  Extraocular movements intact.  Discs are sharp.  Tympanic membranes are  clear.  Throat is without injection.  Mucous membranes are now moist after  significant IV fluids, and he does urinate while I  am in the room.  NECK:  Supple.  Without adenopathy.  No thyromegaly.  CHEST:  Clear.  CARDIOVASCULAR:  Regular rate and rhythm.  Without murmur or rub.  Normal  S1, S2.  No S3, S4 appreciated.  Neurodynamic peripheral pulses.  ABDOMEN:  Soft.  Mild epigastric tenderness.  No rebound.  No peritoneal  signs.  No hepatosplenomegaly or masses appreciated.  EXTREMITIES:  Without edema or lesions.   ASSESSMENT AND PLAN:  1. Significant dehydration with hypotension and multiple electrolyte     abnormalities and most likely prerenal azotemia.  He is already improving     with IV fluids and replacement of electrolytes.  Will check a BMET this     morning, continue IV fluids, and clear liquids p.o.  2. Diabetes mellitus.  Will hold metformin with his elevated BUN and     creatinine.  Insulin sliding scale for now.  3. Alcoholism.  I am going to order Ativan, only p.r.n. for now with his     relative hypotension.  Banana bag IV q.24h.  4. Anemia.  Iron studies.  Follow CBC.  IV Protonix.  Check Hemoccults with     his history of gastrointestinal problems.  5. Depression.  No medications for now.  Feel he will need a psychiatric     evaluation when he is medically stable.                                               Marcene Duos, M.D.    EMM/MEDQ  D:  08/29/2002  T:  08/29/2002  Job:  [id number]

## 2010-08-28 NOTE — H&P (Signed)
David Lindsey, David Lindsey              ACCOUNT NO.:  [id number]   MEDICAL RECORD NO.:  [id number]           PATIENT TYPE:   LOCATION:                                 FACILITY:   PHYSICIAN:  Sean A. Everardo All, MD         DATE OF BIRTH:   DATE OF ADMISSION:  03/08/2006  DATE OF DISCHARGE:                              HISTORY & PHYSICAL   REASON FOR ADMISSION:  Intractable vomiting.   HISTORY OF PRESENT ILLNESS:  This 55 year old man had quit drinking  alcohol in the past.  He resumed to heavy drinking over this recent  holiday weekend.  He was seen in the emergency room day before  yesterday, for 4 days of nausea and vomiting and associated generalized  crampy abdominal pain.  He was prescribed Protonix and Reglan in the  emergency room with no improvement.   PAST MEDICAL HISTORY:  1. Peptic ulcer disease.  2. Hypertension.  3. Diabetes.  4. Leukopenia.  5. Dyslipidemia.  6. Chronic neuropathic leg pain.  7. 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   MEDICATIONS:  1. Neurontin 600 mg 3x times a day.  2. Zestoretic 20/25, one daily.  3. Glucophage 500 mg 3x a day.  4. Potassium chloride 20 mEq daily.  5. Zocor 80 mg a day.  6. Desyrel 50 mg q.h.s.  7. Actos 45 mg a day.  8. AndroGel 2.5 mg daily.   SOCIAL HISTORY:  He is here with his wife.  He works in Clinical research associate.   FAMILY HISTORY:  No one else at home is ill.   REVIEW OF SYSTEMS:  Denies the following:  Fever, shortness of breath,  diarrhea, chest pain, passing out weight loss, rectal bleeding, and  hematuria.  The patient's wife states that he does have some anxiety.  Denies skin rash.   PHYSICAL EXAMINATION:  VITAL SIGNS:  Blood pressure 124/75, heart rate  is 111.  Temperature is 97.9.  the weight is 266  GENERAL:  Slight distress due to nausea.  SKIN:  Not diaphoretic.  No rash.  HEENT: No evidence of head trauma to external examination.  Pharynx no  erythema.  NECK:  Supple.  No  goiter.  CHEST: Clear to auscultation; and he appears slightly shortness of  breath, but in no distress.  CARDIOVASCULAR:  No JVD, no edema.  Regular rate and rhythm.  No murmur.  Pedal pulses are intact.  ABDOMEN:  Soft, slight diffuse tenderness.  No hepatosplenomegaly.  No  mass.  RECTAL:  Not done at this time due to patient's condition.  EXTREMITIES: No deformities seen.  NEUROLOGIC: Alert, well-oriented.  Cranial nerves appear to be intact,  and he readily moves all fours.   LABORATORY STUDIES:  On 03/06/2006 carbon dioxide 16, GOT 60, GPT 57,  lipase 16, urine ketones positive.   IMPRESSION:  1. Intractable vomiting.  2. Slight shortness of breath with possible metabolic acidosis.  3. Chronic alcoholism.  4. Other medical problems as noted above.   PLAN:  1. Admit to [facility].  2. Intravenous fluids.  3.  Recheck laboratory studies including D-dimer.  4. Chest x-ray and EKG.  5. Consult psychiatry for his alcoholism.  6. Symptomatic therapy.  7. Check arterial blood gas on room air.  8. Check cardiac enzymes.  9. Full code.      Sean A. Everardo All, MD  Electronically Signed     SAE/MEDQ  D:  03/08/2006  T:  03/08/2006  Job:  [id number]

## 2010-08-28 NOTE — Op Note (Signed)
David Lindsey, David Lindsey                          ACCOUNT NO.:  [id number]   MEDICAL RECORD NO.:  [id number]                   PATIENT TYPE:  INP   LOCATION:  0375                                 FACILITY:  [facility]   PHYSICIAN:  Bernette Redbird, M.D.                DATE OF BIRTH:  1955/11/27   DATE OF PROCEDURE:  08/30/2002  DATE OF DISCHARGE:                                 OPERATIVE REPORT   PROCEDURE:  Upper endoscopy with biopsies.   INDICATIONS FOR PROCEDURE:  This 55 year old male underwent upper endoscopy  approximately two months ago by Dr. Sherin Quarry (in my absence) while he was  hospitalized at [facility] On that exam, he had severe esophagitis,  biopsies of which, somewhat surprisingly, raised the question of possible  lymphoma. This is being done to followup on that. Note that the patient took  Prevacid for a period of time following his discharge from the hospital but  then ran out of it. He has been drinking alcohol very heavily and I think  that medical compliance is somewhat uncertain.   FINDINGS:  Persistent distal esophagitis involving roughly the distal half  of the esophagus, characterized by fairly confluent desclamating exudate but  no ulceration or significant mass effect appreciated.   DESCRIPTION OF PROCEDURE:  The nature, purpose and risk of the procedure  were familiar to the patient from prior examinations and he provided written  consent. Sedation was fentanyl 75 mcg and Versed 6 mg IV without arrhythmias  or desaturation. The Olympus small caliber adult video endoscope was passed  under direct vision, initially with very light sedation since the patient  had eaten approximately four hours earlier. IV Reglan was given  approximately three hours prior to the procedure to help ensure that the  stomach would be empty and indeed the stomach had no residual food so  further sedation was given for patient comfort for the remainder of the  exam.   The main  finding on this exam was moderately severe, almost confluent,  exudate of the esophagitis involving the distal half of the esophagus. The  proximal half of the esophagus had normal mucosa.   The exudate was smooth, and in the more proximal sections of the esophagus,  somewhat patchy whereas more distally it was fairly confluent.  Interestingly, I did not see any friability, erythema, or ulcerations. No  varices, infection, or obvious neoplasia were observed. No stricture was  evident but the patient did have a widely patent esophageal ring below which  was a 2 cm hiatal hernia. Multiple esophageal biopsies were obtained from  the distal esophagus prior to removal of the scope to compare to the  biopsies from two months ago which raised the question of lymphoma.   The stomach contained no residual and had normal mucosa without evidence of  gastritis, erosions, ulcers, polyps or masses and there appeared to be very  normal gastric  motility with stripping waves going down to the pylorus.  Retroflexed viewing of the cardia showed a small hiatal hernia from its  inferior perspective.   The pylorus, duodenal bulb and second duodenum looked normal.   The scope was removed from the patient after obtaining the above mentioned  biopsies. He tolerated the procedure well and there were no apparent  complications.    IMPRESSION:  Severe distal esophagitis but again, no endoscopic evidence to  suggest lymphoma.   PLAN:  Await pathology results in comparison to recent biopsies.                                               Bernette Redbird, M.D.    RB/MEDQ  D:  08/30/2002  T:  08/30/2002  Job:  [id number]   cc:   Georgann Housekeeper, M.D.  [address]  [place]  [place] [address]  Fax: [phone]   Tasia Catchings, M.D.  [address]  [place]  [place] [address]  Fax: [phone]

## 2010-08-28 NOTE — Assessment & Plan Note (Signed)
David Lindsey returns to the clinic today for follow-up evaluation.  He  reports that he is experiencing less tingling pain of his lower extremities.  He does report that the Neurontin medication used three to four times per  day on an as-needed basis is helping substantially.  He does notice when he  has not used for several hours at a time.  He is able to use it only three  or four times per day with good relief.   The patient reports that his blood sugars measured before breakfast have  been in the 113-123 range.  He reports that a recent hemoglobin A1C came  back at 5.5 with Dr. Everardo All.   Dr. Everardo All apparently has added amitriptyline on a p.r.n. basis.   The patient remains unemployed and does chores around his home.   MEDICATIONS:  1. Glucophage 500 mg three times daily.  2. Potassium chloride 20 mEq daily.  3. Prilosec over-the-counter daily.  4. Lovastatin 40 mg daily.  5. Neurontin 300 mg three times daily to q.i.d. p.r.n.  6. Lisinopril/hydrochlorothiazide 20/2.5, 1 tablet daily.   PHYSICAL EXAM:  GENERAL:  Well-appearing, slightly overweight adult male.  VITAL SIGNS:  Blood pressure 149/74 with a pulse of 113, O2 saturation 98%  on room air.  NEUROLOGIC:  He has reflexes of 1 to 2+ in the bilateral upper extremities.  Strength was normal throughout the bilateral upper and lower extremities at  5- to 5/5.  Bulk and tone were normal, and sensation was intact to light  touch bilateral lower extremities.   IMPRESSION:  1. Non-insulin-dependent diabetes mellitus, controlled.  2. 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   At the present time the patient reports that he has sufficient Neurontin  probably to cover him over the next several months.  At this point will  continue him on the same dose.  Will see him in follow-up in approximately  three months' time.  I have encouraged him to again monitor his diet along  with his blood sugars and to  remain as active as possible with daily  walking, especially during the holidays when increased caloric intake is  expected.   I will plan on seeing him in follow-up as noted above.      Ellwood Dense, M.D.   DC/MedQ  D:  03/14/2003 11:11:25  T:  03/14/2003 12:21:24  Job #:  [id number]

## 2010-09-22 ENCOUNTER — Other Ambulatory Visit: Payer: Self-pay | Admitting: Endocrinology

## 2010-09-22 MED ORDER — SIMVASTATIN 40 MG PO TABS: 40.0000 mg | ORAL_TABLET | Freq: Every day | ORAL | Status: DC

## 2010-09-22 MED ORDER — TRAZODONE HCL 50 MG PO TABS: 50.0000 mg | ORAL_TABLET | Freq: Every day | ORAL | Status: DC

## 2010-09-22 MED ORDER — OMEPRAZOLE 20 MG PO CPDR: 20.0000 mg | DELAYED_RELEASE_CAPSULE | Freq: Every day | ORAL | Status: DC

## 2010-09-22 NOTE — Telephone Encounter (Signed)
SAE pt-please advise 

## 2010-09-22 NOTE — Telephone Encounter (Signed)
PT left message requesting 90 day supply of Trazodone, Simvastatin and Omeprazole be sent to [facility] pharmacy.

## 2010-09-23 NOTE — Telephone Encounter (Signed)
Rx for Testim faxed to [facility].

## 2010-12-03 ENCOUNTER — Telehealth: Payer: Self-pay | Admitting: *Deleted

## 2010-12-03 DIAGNOSIS — Z0389 Encounter for observation for other suspected diseases and conditions ruled out: Secondary | ICD-10-CM

## 2010-12-03 DIAGNOSIS — E119 Type 2 diabetes mellitus without complications: Secondary | ICD-10-CM

## 2010-12-03 DIAGNOSIS — E291 Testicular hypofunction: Secondary | ICD-10-CM

## 2010-12-03 DIAGNOSIS — Z0000 Encounter for general adult medical examination without abnormal findings: Secondary | ICD-10-CM

## 2010-12-03 NOTE — Telephone Encounter (Signed)
Labs placed into Epic for upcoming appointment

## 2010-12-07 ENCOUNTER — Other Ambulatory Visit: Payer: BC Managed Care – PPO

## 2010-12-07 ENCOUNTER — Other Ambulatory Visit (INDEPENDENT_AMBULATORY_CARE_PROVIDER_SITE_OTHER): Payer: BC Managed Care – PPO

## 2010-12-07 DIAGNOSIS — Z0389 Encounter for observation for other suspected diseases and conditions ruled out: Secondary | ICD-10-CM

## 2010-12-07 DIAGNOSIS — Z0000 Encounter for general adult medical examination without abnormal findings: Secondary | ICD-10-CM

## 2010-12-07 DIAGNOSIS — E291 Testicular hypofunction: Secondary | ICD-10-CM

## 2010-12-07 DIAGNOSIS — E119 Type 2 diabetes mellitus without complications: Secondary | ICD-10-CM

## 2010-12-07 LAB — CBC WITH DIFFERENTIAL/PLATELET
Basophils Relative: 0.5 % (ref 0.0–3.0)
Eosinophils Absolute: 0.2 10*3/uL (ref 0.0–0.7)
Hemoglobin: 13.8 g/dL (ref 13.0–17.0)
Lymphs Abs: 1.4 10*3/uL (ref 0.7–4.0)
MCHC: 32.9 g/dL (ref 30.0–36.0)
MCV: 86.8 fl (ref 78.0–100.0)
Monocytes Absolute: 0.3 10*3/uL (ref 0.1–1.0)
Neutro Abs: 1.5 10*3/uL (ref 1.4–7.7)
RBC: 4.85 Mil/uL (ref 4.22–5.81)

## 2010-12-07 LAB — BASIC METABOLIC PANEL
CO2: 28 mEq/L (ref 19–32)
Calcium: 9.6 mg/dL (ref 8.4–10.5)
Chloride: 100 mEq/L (ref 96–112)
Sodium: 138 mEq/L (ref 135–145)

## 2010-12-07 LAB — URINALYSIS, ROUTINE W REFLEX MICROSCOPIC
Bilirubin Urine: NEGATIVE
Ketones, ur: NEGATIVE
Leukocytes, UA: NEGATIVE
pH: 7 (ref 5.0–8.0)

## 2010-12-07 LAB — HEPATIC FUNCTION PANEL
ALT: 24 U/L (ref 0–53)
Bilirubin, Direct: 0.1 mg/dL (ref 0.0–0.3)
Total Bilirubin: 0.6 mg/dL (ref 0.3–1.2)

## 2010-12-07 LAB — HEMOGLOBIN A1C: Hgb A1c MFr Bld: 8.3 % — ABNORMAL HIGH (ref 4.6–6.5)

## 2010-12-07 LAB — LIPID PANEL: Total CHOL/HDL Ratio: 3

## 2010-12-08 ENCOUNTER — Other Ambulatory Visit: Payer: Self-pay

## 2010-12-08 LAB — TESTOSTERONE, FREE, TOTAL, SHBG
Testosterone, Free: 261.1 pg/mL — ABNORMAL HIGH (ref 47.0–244.0)
Testosterone-% Free: 2.8 % (ref 1.6–2.9)
Testosterone: 927.73 ng/dL — ABNORMAL HIGH (ref 250–890)

## 2010-12-08 MED ORDER — LISINOPRIL-HYDROCHLOROTHIAZIDE 10-12.5 MG PO TABS: 1.0000 | ORAL_TABLET | Freq: Every day | ORAL | Status: DC

## 2010-12-08 MED ORDER — POTASSIUM CHLORIDE CRYS ER 20 MEQ PO TBCR: 20.0000 meq | EXTENDED_RELEASE_TABLET | Freq: Every day | ORAL | Status: DC

## 2010-12-10 ENCOUNTER — Other Ambulatory Visit: Payer: Self-pay | Admitting: *Deleted

## 2010-12-10 ENCOUNTER — Ambulatory Visit (INDEPENDENT_AMBULATORY_CARE_PROVIDER_SITE_OTHER): Payer: BC Managed Care – PPO | Admitting: Endocrinology

## 2010-12-10 ENCOUNTER — Encounter: Payer: Self-pay | Admitting: Endocrinology

## 2010-12-10 VITALS — BP 106/80 | HR 84 | Temp 98.9°F | Ht 71.0 in | Wt 304.6 lb

## 2010-12-10 DIAGNOSIS — Z0000 Encounter for general adult medical examination without abnormal findings: Secondary | ICD-10-CM

## 2010-12-10 DIAGNOSIS — Z136 Encounter for screening for cardiovascular disorders: Secondary | ICD-10-CM

## 2010-12-10 DIAGNOSIS — Z23 Encounter for immunization: Secondary | ICD-10-CM

## 2010-12-10 DIAGNOSIS — E119 Type 2 diabetes mellitus without complications: Secondary | ICD-10-CM

## 2010-12-10 MED ORDER — TRAZODONE HCL 50 MG PO TABS: 50.0000 mg | ORAL_TABLET | Freq: Every day | ORAL | Status: DC

## 2010-12-10 MED ORDER — CLOTRIMAZOLE-BETAMETHASONE 1-0.05 % EX CREA: TOPICAL_CREAM | CUTANEOUS | Status: DC

## 2010-12-10 MED ORDER — SITAGLIPTIN PHOSPHATE 100 MG PO TABS: 100.0000 mg | ORAL_TABLET | Freq: Every day | ORAL | Status: DC

## 2010-12-10 MED ORDER — GABAPENTIN 300 MG PO CAPS: ORAL_CAPSULE | ORAL | Status: DC

## 2010-12-10 NOTE — Patient Instructions (Addendum)
Try stopping testim on a trial basis.  We'll recheck the blood test when you return.  Please make a follow-up appointment in 3 months good diet and exercise habits significanly improve the control of your diabetes.  please let me know if you wish to be referred to a dietician.  high blood sugar is very risky to your health.  you should see an eye doctor every year. controlling your blood pressure and cholesterol drastically reduces the damage diabetes does to your body.  this also applies to quitting smoking.  please discuss these with your doctor.  you should take an aspirin every day, unless you have been advised by a doctor not to.   Add januvia 100 mg each morning. i have sent a prescription to your pharmacy, for a skin cream please consider these measures for your health:  minimize alcohol.  do not use tobacco products.  have a colonoscopy at least every 10 years from age 68.  Women should have an annual mammogram from age 55.  keep firearms safely stored.  always use seat belts.  have working smoke alarms in your home.  see an eye doctor and dentist regularly.  never drive under the influence of alcohol or drugs (including prescription drugs).   please let me know what your wishes would be, if artificial life support measures should become necessary.  it is critically important to prevent falling down (keep floor areas well-lit, dry, and free of loose objects).

## 2010-12-10 NOTE — Progress Notes (Signed)
Subjective:    Patient ID: David Lindsey, male    DOB: 1955-10-20, 55 y.o.   MRN: [id number]  HPI here for regular wellness examination.  He's feeling pretty well in general, and says chronic med probs are stable, except as noted below Past Medical History  Diagnosis Date  . DIABETES MELLITUS, TYPE II 11/10/2006  . 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 HYPERCHOLESTEROLEMIA 12/22/2007  . HYPOKALEMIA 06/06/2008  . ANEMIA, IRON DEFICIENCY 12/26/2007  . Pancytopenia 06/06/2008  . ALCOHOLISM 12/22/2007  . HYPERTENSION 11/10/2006  . ESOPHAGITIS, REFLUX 12/22/2007  . RENAL INSUFFICIENCY 11/10/2006  . Leukopenia   . Neuropathy     Chronic Neuropathy leg pain    Past Surgical History  Procedure Date  . Appendectomy   . Esophagogastroduodenoscopy 08/20/2004  . Electrocardiogram 11/07/2006    History   Social History  . Marital Status: Married    Spouse Name: N/A    Number of Children: N/A  . 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 Smoking status: Never Smoker   . Smokeless tobacco: Not on file  . Alcohol Use: No  . Drug Use:   . Sexually Active:    Other Topics Concern  . Not on file   Social History Narrative  . No narrative on file    Current Outpatient Prescriptions on File Prior to Visit  Medication Sig Dispense Refill  . lisinopril-hydrochlorothiazide (PRINZIDE,ZESTORETIC) 10-12.5 MG per tablet Take 1 tablet by mouth daily.  90 tablet  1  . metFORMIN (GLUCOPHAGE-XR) 500 MG 24 hr tablet Take 2 tablets (1,000 mg total) by mouth 2 (two) times daily.  360 tablet  1  . omeprazole (PRILOSEC) 20 MG capsule Take 1 capsule (20 mg total) by mouth daily.  90 capsule  1  . potassium chloride SA (K-DUR,KLOR-CON) 20 MEQ tablet Take 1 tablet (20 mEq total) by mouth daily.  90 tablet  1  . simvastatin (ZOCOR) 40 MG tablet Take 1 tablet (40 mg total) by mouth at bedtime.  90 tablet  1    Allergies  Allergen Reactions    . Pioglitazone     REACTION: edema    Family History  Problem Relation Age of Onset  . Cancer Mother     Colon Cancer    BP 106/80  Pulse 84  Temp(Src) 98.9 F (37.2 C) (Oral)  Ht 5\' 11"  (1.803 m)  Wt 304 lb 9.6 oz (138.166 kg)  BMI 42.48 kg/m2  SpO2 96%     Review of Systems  Constitutional: Negative for fever.       Pt reports weight gain  HENT: Negative for hearing loss.   Eyes: Negative for visual disturbance.  Respiratory: Negative for shortness of breath.   Cardiovascular: Negative for chest pain.  Gastrointestinal: Negative for anal bleeding.  Genitourinary: Negative for hematuria.  Musculoskeletal: Negative for arthralgias.  Neurological: Negative for syncope.  Hematological: Does not bruise/bleed easily.  Psychiatric/Behavioral: Negative for dysphoric mood.       Objective:   Physical Exam VS: see vs page GEN: no distress HEAD: head: no deformity eyes: no periorbital swelling, no proptosis external nose and ears are normal mouth: no lesion seen NECK: supple, thyroid is not enlarged CHEST WALL: no deformity BREASTS:  There is bilat pseudogynecomastia CV: reg rate and rhythm, no murmur ABD: abdomen is soft, nontender.  no hepatosplenomegaly.  not distended.  no hernia GENITALIA:  Normal  male.  No rash RECTAL: normal external and internal exam.  heme neg. PROSTATE:  Normal size.  No nodule MUSCULOSKELETAL: muscle bulk and strength are grossly normal.  no obvious joint swelling.  gait is normal and steady.   There is a healed right tkr scar EXTEMITIES: no deformity.  no ulcer on the feet.  feet are of normal color and temp.  1+ bilat leg edema.  onychomycosis of toenails PULSES: dorsalis pedis intact bilat.  no carotid bruit. NEURO:  cn 2-12 grossly intact.   readily moves all 4's.  sensation is intact to touch on the feet.   SKIN:  Normal texture and temperature.  No rash or suspicious lesion is visible.   NODES:  None palpable at the neck PSYCH:  alert, oriented x3.  Does not appear anxious nor depressed.       Assessment & Plan:  Wellness visit today, with problems stable, except as noted.     SEPARATE EVALUATION FOLLOWS--EACH PROBLEM HERE IS NEW, NOT RESPONDING TO TREATMENT, OR POSES SIGNIFICANT RISK TO THE PATIENT'S HEALTH: HISTORY OF THE PRESENT ILLNESS: Pt says he is taking less than the prescribed dosage of testim.  He says he takes no other androgens. He says he still needs viagra.   Pt states few weeks of slight itching of the scrotum, but no assoc rash. PAST MEDICAL HISTORY reviewed and up to date today REVIEW OF SYSTEMS: Denies decreased urinary stream and headache PHYSICAL EXAMINATION: VITAL SIGNS:  See vs page GENERAL: no distress LUNGS:  Clear to auscultation GENITALIA: Normal male testicles, scrotum, and penis.  No rash LAB/XRAY RESULTS: Lab Results  Component Value Date   HGBA1C 8.3* 12/07/2010   Lab Results  Component Value Date   TESTOSTERONE 927.73* 12/07/2010  IMPRESSION: Dm, needs increased rx Scrotal itching, usually due to superficial mycosis, new Hypogonadism, overreplaced PLAN: See instruction page

## 2010-12-30 LAB — COMPREHENSIVE METABOLIC PANEL
Albumin: 3.7
BUN: 21
Calcium: 8.5
Creatinine, Ser: 1.16
Total Protein: 7

## 2010-12-30 LAB — AMYLASE: Amylase: 112

## 2010-12-30 LAB — CBC
MCHC: 32.8
MCV: 90.2
Platelets: 173
Platelets: ADEQUATE
RBC: 4.66
RDW: 14.2
RDW: 14.3

## 2010-12-30 LAB — BLOOD GAS, ARTERIAL
Bicarbonate: 16 — ABNORMAL LOW
FIO2: 0.21
Patient temperature: 98.6
pH, Arterial: 7.412
pO2, Arterial: 85.8

## 2010-12-30 LAB — OSMOLALITY: Osmolality: 291

## 2010-12-30 LAB — BASIC METABOLIC PANEL
BUN: 14
BUN: 19
CO2: 25
Calcium: 8.2 — ABNORMAL LOW
Chloride: 100
Chloride: 102
Creatinine, Ser: 1.07
Creatinine, Ser: 1.44
GFR calc Af Amer: >60
Glucose, Bld: 206 — ABNORMAL HIGH

## 2010-12-30 LAB — DIFFERENTIAL
Lymphocytes Relative: 10 — ABNORMAL LOW
Monocytes Absolute: 0.3
Monocytes Relative: 4
Neutro Abs: 7.3

## 2010-12-30 LAB — HEMOGLOBIN A1C
Hgb A1c MFr Bld: 6.3 — ABNORMAL HIGH
Mean Plasma Glucose: 147

## 2011-02-02 ENCOUNTER — Other Ambulatory Visit: Payer: Self-pay

## 2011-02-02 MED ORDER — SIMVASTATIN 40 MG PO TABS: 40.0000 mg | ORAL_TABLET | Freq: Every day | ORAL | Status: DC

## 2011-02-02 MED ORDER — OMEPRAZOLE 20 MG PO CPDR: 20.0000 mg | DELAYED_RELEASE_CAPSULE | Freq: Every day | ORAL | Status: DC

## 2011-02-02 MED ORDER — METFORMIN HCL ER 500 MG PO TB24: 1000.0000 mg | ORAL_TABLET | Freq: Two times a day (BID) | ORAL | Status: DC

## 2011-02-16 ENCOUNTER — Other Ambulatory Visit: Payer: Self-pay

## 2011-02-16 MED ORDER — SILDENAFIL CITRATE 100 MG PO TABS: 100.0000 mg | ORAL_TABLET | ORAL | Status: DC | PRN

## 2011-02-16 MED ORDER — BROMOCRIPTINE MESYLATE 2.5 MG PO TABS: 2.5000 mg | ORAL_TABLET | Freq: Every day | ORAL | Status: DC

## 2011-02-25 ENCOUNTER — Other Ambulatory Visit (INDEPENDENT_AMBULATORY_CARE_PROVIDER_SITE_OTHER): Payer: BC Managed Care – PPO

## 2011-02-25 ENCOUNTER — Ambulatory Visit (INDEPENDENT_AMBULATORY_CARE_PROVIDER_SITE_OTHER): Payer: BC Managed Care – PPO | Admitting: Endocrinology

## 2011-02-25 ENCOUNTER — Encounter: Payer: Self-pay | Admitting: Endocrinology

## 2011-02-25 ENCOUNTER — Ambulatory Visit: Payer: BC Managed Care – PPO | Admitting: Endocrinology

## 2011-02-25 DIAGNOSIS — E291 Testicular hypofunction: Secondary | ICD-10-CM

## 2011-02-25 DIAGNOSIS — E119 Type 2 diabetes mellitus without complications: Secondary | ICD-10-CM

## 2011-02-25 NOTE — Progress Notes (Signed)
Subjective:    Patient ID: David Lindsey, male    DOB: [birth year], 55 y.o.   MRN: [id number]  HPI The state of at least three ongoing medical problems is addressed today: Pt returns for f/u of type 2 DM (2002).  He says cbg's are well-controlled, with the addition of the [place].  denies hypoglycemia Hypogonadism: he feels no different since he has been off the testim ED: viagra works well.   Past Medical History  Diagnosis Date  . DIABETES MELLITUS, TYPE II 11/10/2006  . 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 HYPERCHOLESTEROLEMIA 12/22/2007  . HYPOKALEMIA 06/06/2008  . ANEMIA, IRON DEFICIENCY 12/26/2007  . Pancytopenia 06/06/2008  . ALCOHOLISM 12/22/2007  . HYPERTENSION 11/10/2006  . ESOPHAGITIS, REFLUX 12/22/2007  . RENAL INSUFFICIENCY 11/10/2006  . Leukopenia   . Neuropathy     Chronic Neuropathy leg pain    Past Surgical History  Procedure Date  . Appendectomy   . Esophagogastroduodenoscopy 08/20/2004  . Electrocardiogram 11/07/2006    History   Social History  . Marital Status: Married    Spouse Name: N/A    Number of Children: N/A  . 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 Smoking status: Never Smoker   . Smokeless tobacco: Not on file  . Alcohol Use: No  . Drug Use:   . Sexually Active:    Other Topics Concern  . Not on file   Social History Narrative  . No narrative on file    Current Outpatient Prescriptions on File Prior to Visit  Medication Sig Dispense Refill  . aspirin 81 MG tablet Take 81 mg by mouth daily.        . bromocriptine (PARLODEL) 2.5 MG tablet Take 1 tablet (2.5 mg total) by mouth at bedtime.  90 tablet  1  . clotrimazole-betamethasone (LOTRISONE) cream Apply 3x a day as needed for rash  30 g  0  . fish oil-omega-3 fatty acids 1000 MG capsule Take 3 capsules by mouth daily.        Marland Kitchen gabapentin (NEURONTIN) 300 MG capsule Take 2 capsules by mouth three times daily  540 capsule   3  . glucose blood (ONE TOUCH ULTRA TEST) test strip Use as instructed check blood sugar two times daily       . lisinopril-hydrochlorothiazide (PRINZIDE,ZESTORETIC) 10-12.5 MG per tablet Take 1 tablet by mouth daily.  90 tablet  1  . metFORMIN (GLUCOPHAGE-XR) 500 MG 24 hr tablet Take 2 tablets (1,000 mg total) by mouth 2 (two) times daily.  360 tablet  1  . omeprazole (PRILOSEC) 20 MG capsule Take 1 capsule (20 mg total) by mouth daily.  90 capsule  1  . potassium chloride SA (K-DUR,KLOR-CON) 20 MEQ tablet Take 1 tablet (20 mEq total) by mouth daily.  90 tablet  1  . sildenafil (VIAGRA) 100 MG tablet Take 1 tablet (100 mg total) by mouth as needed.  30 tablet  1  . simvastatin (ZOCOR) 40 MG tablet Take 1 tablet (40 mg total) by mouth at bedtime.  90 tablet  1  . sitaGLIPtin (JANUVIA) 100 MG tablet Take 1 tablet (100 mg total) by mouth daily.  30 tablet  11  . traZODone (DESYREL) 50 MG tablet Take 1 tablet (50 mg total) by mouth at bedtime.  90 tablet  3    Allergies  Allergen Reactions  . Pioglitazone     REACTION:  edema    Family History  Problem Relation Age of Onset  . Cancer Mother     Colon Cancer    BP 102/70  Pulse 78  Temp(Src) 98 F (36.7 C) (Oral)  Ht 5\' 11"  (1.803 m)  Wt 305 lb (138.347 kg)  BMI 42.54 kg/m2  SpO2 97%   Review of Systems  Constitutional: Negative for unexpected weight change.  Genitourinary: Negative for difficulty urinating.      Objective:   Physical Exam VITAL SIGNS:  See vs page GENERAL: no distress GENITALIA: Normal male scrotum, and penis.  testes are small and soft.      Assessment & Plan:  DM.  He should continue his transition away from glimepiride Hypogonadism, uncertain etiology Ed, well-controlled

## 2011-02-25 NOTE — Patient Instructions (Addendum)
Please make a follow-up appointment in 3 months.   check your blood sugar 1 time a day.  vary the time of day when you check, between before the 3 meals, and at bedtime.  also check if you have symptoms of your blood sugar being too high or too low.  please keep a record of the readings and bring it to your next appointment here.  please call us sooner if your blood sugar goes below 70, or if it stays over 200. blood tests are being requested for you today.  please call [phone] to hear your test results.  You will be prompted to enter the 9-digit "MRN" number that appears at the top left of this page, followed by #.  Then you will hear the message. If you a1c is high, these are your options: addition of glimepiride, "welchol" (cholesterol medication), increasing the bromocriptine, and changing the [place] to "victoza" (a once-a-day injection, which is a more-effective relative of [place]).

## 2011-03-01 ENCOUNTER — Ambulatory Visit: Payer: BC Managed Care – PPO

## 2011-03-01 DIAGNOSIS — E291 Testicular hypofunction: Secondary | ICD-10-CM

## 2011-03-02 LAB — TESTOSTERONE, FREE, TOTAL, SHBG
Sex Hormone Binding: 20 nmol/L (ref 13–71)
Testosterone, Free: 30.2 pg/mL — ABNORMAL LOW (ref 47.0–244.0)
Testosterone-% Free: 2.4 % (ref 1.6–2.9)

## 2011-03-19 ENCOUNTER — Telehealth: Payer: Self-pay | Admitting: *Deleted

## 2011-03-19 NOTE — Telephone Encounter (Signed)
Options are injections or gel applied to th skin.  Please let me know

## 2011-03-19 NOTE — Telephone Encounter (Signed)
Pt left msg on vm md wanting to start him back on testosterone injections. Requesting md to send rx to [email or web address]...03/19/11@4 :44pm/LMB

## 2011-03-22 MED ORDER — TESTOSTERONE CYPIONATE 200 MG/ML IM SOLN: INTRAMUSCULAR | Status: DC

## 2011-03-22 NOTE — Telephone Encounter (Signed)
Rx faxed to [facility], pt informed via VM rx sent to pharmacy.

## 2011-03-22 NOTE — Telephone Encounter (Signed)
i printed 

## 2011-03-22 NOTE — Telephone Encounter (Signed)
Pt is wanting injections...03/22/11@8 ;37am/LMB

## 2011-04-20 ENCOUNTER — Other Ambulatory Visit: Payer: Self-pay

## 2011-04-20 MED ORDER — POTASSIUM CHLORIDE CRYS ER 20 MEQ PO TBCR: 20.0000 meq | EXTENDED_RELEASE_TABLET | Freq: Every day | ORAL | Status: DC

## 2011-04-20 MED ORDER — LISINOPRIL-HYDROCHLOROTHIAZIDE 10-12.5 MG PO TABS: 1.0000 | ORAL_TABLET | Freq: Every day | ORAL | Status: DC

## 2011-10-19 ENCOUNTER — Other Ambulatory Visit: Payer: Self-pay

## 2011-10-19 MED ORDER — LISINOPRIL-HYDROCHLOROTHIAZIDE 10-12.5 MG PO TABS: 1.0000 | ORAL_TABLET | Freq: Every day | ORAL | Status: DC

## 2011-10-19 MED ORDER — SITAGLIPTIN PHOSPHATE 100 MG PO TABS: 100.0000 mg | ORAL_TABLET | Freq: Every day | ORAL | Status: DC

## 2011-10-19 NOTE — Telephone Encounter (Signed)
Pt called stating that due to a recent job change, his insurance carrier has also changed. Pt is requesting all Rxs to [facility] going forward.

## 2011-10-26 ENCOUNTER — Telehealth: Payer: Self-pay | Admitting: *Deleted

## 2011-10-26 DIAGNOSIS — Z0000 Encounter for general adult medical examination without abnormal findings: Secondary | ICD-10-CM

## 2011-10-26 DIAGNOSIS — Z125 Encounter for screening for malignant neoplasm of prostate: Secondary | ICD-10-CM

## 2011-10-26 DIAGNOSIS — E119 Type 2 diabetes mellitus without complications: Secondary | ICD-10-CM

## 2011-10-26 DIAGNOSIS — E291 Testicular hypofunction: Secondary | ICD-10-CM

## 2011-10-26 NOTE — Telephone Encounter (Signed)
Message copied by Carin Primrose on Tue Oct 26, 2011  3:24 PM ------      Message from: Newell Coral      Created: Mon Oct 18, 2011 11:20 AM      Regarding: cpe sch ,needs labs       The pt scheduled a cpe for the end of august and is hoping to get labs done. Thanks!

## 2011-10-26 NOTE — Telephone Encounter (Signed)
Message copied by Deatra James on Tue Oct 26, 2011  3:30 PM ------      Message from: Newell Coral      Created: Mon Oct 18, 2011 11:20 AM      Regarding: cpe sch ,needs labs       The pt scheduled a cpe for the end of august and is hoping to get labs done. Thanks!

## 2011-10-26 NOTE — Telephone Encounter (Signed)
Labs already entered per [place]... 10/26/11@3 :32pm/LB

## 2011-10-26 NOTE — Telephone Encounter (Signed)
CPX labs placed into Epic for upcoming appointment. 

## 2011-12-07 ENCOUNTER — Other Ambulatory Visit (INDEPENDENT_AMBULATORY_CARE_PROVIDER_SITE_OTHER): Payer: BC Managed Care – PPO

## 2011-12-07 DIAGNOSIS — Z0000 Encounter for general adult medical examination without abnormal findings: Secondary | ICD-10-CM

## 2011-12-07 DIAGNOSIS — Z125 Encounter for screening for malignant neoplasm of prostate: Secondary | ICD-10-CM

## 2011-12-07 DIAGNOSIS — E291 Testicular hypofunction: Secondary | ICD-10-CM

## 2011-12-07 DIAGNOSIS — E119 Type 2 diabetes mellitus without complications: Secondary | ICD-10-CM

## 2011-12-07 LAB — CBC WITH DIFFERENTIAL/PLATELET
Basophils Absolute: 0 10*3/uL (ref 0.0–0.1)
Eosinophils Relative: 3.1 % (ref 0.0–5.0)
HCT: 41.8 % (ref 39.0–52.0)
Lymphocytes Relative: 34.2 % (ref 12.0–46.0)
Lymphs Abs: 1.8 10*3/uL (ref 0.7–4.0)
Monocytes Relative: 9.2 % (ref 3.0–12.0)
Platelets: 151 10*3/uL (ref 150.0–400.0)
RDW: 13.4 % (ref 11.5–14.6)
WBC: 5.2 10*3/uL (ref 4.5–10.5)

## 2011-12-07 LAB — URINALYSIS, ROUTINE W REFLEX MICROSCOPIC
Specific Gravity, Urine: 1.015 (ref 1.000–1.030)
Total Protein, Urine: NEGATIVE
Urine Glucose: NEGATIVE

## 2011-12-07 LAB — HEPATIC FUNCTION PANEL
AST: 25 U/L (ref 0–37)
Alkaline Phosphatase: 47 U/L (ref 39–117)
Bilirubin, Direct: 0.1 mg/dL (ref 0.0–0.3)
Total Bilirubin: 0.6 mg/dL (ref 0.3–1.2)

## 2011-12-07 LAB — LIPID PANEL
HDL: 33.6 mg/dL — ABNORMAL LOW (ref >39.00)
LDL Cholesterol: 60 mg/dL (ref 0–99)
Total CHOL/HDL Ratio: 3

## 2011-12-07 LAB — BASIC METABOLIC PANEL
BUN: 16 mg/dL (ref 6–23)
CO2: 25 mEq/L (ref 19–32)
Calcium: 9.2 mg/dL (ref 8.4–10.5)
Chloride: 103 mEq/L (ref 96–112)
Creatinine, Ser: 1.3 mg/dL (ref 0.4–1.5)
Glucose, Bld: 102 mg/dL — ABNORMAL HIGH (ref 70–99)

## 2011-12-07 LAB — TSH: TSH: 1.46 u[IU]/mL (ref 0.35–5.50)

## 2011-12-07 LAB — PSA: PSA: 0.68 ng/mL (ref 0.10–4.00)

## 2011-12-07 LAB — HEMOGLOBIN A1C: Hgb A1c MFr Bld: 7.1 % — ABNORMAL HIGH (ref 4.6–6.5)

## 2011-12-10 ENCOUNTER — Encounter: Payer: Self-pay | Admitting: Endocrinology

## 2011-12-10 ENCOUNTER — Ambulatory Visit (INDEPENDENT_AMBULATORY_CARE_PROVIDER_SITE_OTHER): Payer: BC Managed Care – PPO | Admitting: Endocrinology

## 2011-12-10 VITALS — BP 124/82 | HR 85 | Temp 99.5°F | Wt 291.0 lb

## 2011-12-10 DIAGNOSIS — Z0000 Encounter for general adult medical examination without abnormal findings: Secondary | ICD-10-CM

## 2011-12-10 DIAGNOSIS — E119 Type 2 diabetes mellitus without complications: Secondary | ICD-10-CM

## 2011-12-10 MED ORDER — CLOTRIMAZOLE-BETAMETHASONE 1-0.05 % EX CREA: TOPICAL_CREAM | Freq: Three times a day (TID) | CUTANEOUS | Status: DC | PRN

## 2011-12-10 MED ORDER — TRAZODONE HCL 50 MG PO TABS: 50.0000 mg | ORAL_TABLET | Freq: Every day | ORAL | Status: DC

## 2011-12-10 MED ORDER — TESTOSTERONE CYPIONATE 200 MG/ML IM SOLN: INTRAMUSCULAR | Status: DC

## 2011-12-10 MED ORDER — GABAPENTIN 300 MG PO CAPS: ORAL_CAPSULE | ORAL | Status: DC

## 2011-12-10 MED ORDER — BROMOCRIPTINE MESYLATE 2.5 MG PO TABS: 2.5000 mg | ORAL_TABLET | Freq: Two times a day (BID) | ORAL | Status: DC

## 2011-12-10 MED ORDER — POTASSIUM CHLORIDE CRYS ER 20 MEQ PO TBCR: 20.0000 meq | EXTENDED_RELEASE_TABLET | Freq: Every day | ORAL | Status: DC

## 2011-12-10 MED ORDER — OMEPRAZOLE 20 MG PO CPDR: 20.0000 mg | DELAYED_RELEASE_CAPSULE | Freq: Every day | ORAL | Status: DC

## 2011-12-10 MED ORDER — SIMVASTATIN 40 MG PO TABS: 40.0000 mg | ORAL_TABLET | Freq: Every day | ORAL | Status: DC

## 2011-12-10 NOTE — Progress Notes (Signed)
Subjective:    Patient ID: David Lindsey, male    DOB: Feb 09, 1956, 56 y.o.   MRN: [id number]  HPI here for regular wellness examination.  He's feeling pretty well in general, and says chronic med probs are stable, except as noted below. Past Medical History  Diagnosis Date  . DIABETES MELLITUS, TYPE II 11/10/2006  . 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 HYPERCHOLESTEROLEMIA 12/22/2007  . HYPOKALEMIA 06/06/2008  . ANEMIA, IRON DEFICIENCY 12/26/2007  . Pancytopenia 06/06/2008  . ALCOHOLISM 12/22/2007  . HYPERTENSION 11/10/2006  . ESOPHAGITIS, REFLUX 12/22/2007  . RENAL INSUFFICIENCY 11/10/2006  . Leukopenia   . Neuropathy     Chronic Neuropathy leg pain    Past Surgical History  Procedure Date  . Appendectomy   . Esophagogastroduodenoscopy 08/20/2004  . Electrocardiogram 11/07/2006    History   Social History  . Marital Status: Married    Spouse Name: N/A    Number of Children: N/A  . 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 Smoking status: Never Smoker   . Smokeless tobacco: Not on file  . Alcohol Use: No  . Drug Use:   . Sexually Active:    Other Topics Concern  . Not on file   Social History Narrative  . No narrative on file    Current Outpatient Prescriptions on File Prior to Visit  Medication Sig Dispense Refill  . aspirin 81 MG tablet Take 81 mg by mouth daily.        . clotrimazole-betamethasone (LOTRISONE) cream Apply 3x a day as needed for rash  30 g  0  . fish oil-omega-3 fatty acids 1000 MG capsule Take 3 capsules by mouth daily.        Marland Kitchen gabapentin (NEURONTIN) 300 MG capsule Take 2 capsules by mouth three times daily  540 capsule  3  . glucose blood (ONE TOUCH ULTRA TEST) test strip Use as instructed check blood sugar two times daily       . lisinopril-hydrochlorothiazide (PRINZIDE,ZESTORETIC) 10-12.5 MG per tablet Take 1 tablet by mouth daily.  90 tablet  1  . metFORMIN (GLUCOPHAGE-XR) 500  MG 24 hr tablet Take 2 tablets (1,000 mg total) by mouth 2 (two) times daily.  360 tablet  1  . omeprazole (PRILOSEC) 20 MG capsule Take 1 capsule (20 mg total) by mouth daily.  90 capsule  1  . potassium chloride SA (K-DUR,KLOR-CON) 20 MEQ tablet Take 1 tablet (20 mEq total) by mouth daily.  90 tablet  1  . sildenafil (VIAGRA) 100 MG tablet Take 1 tablet (100 mg total) by mouth as needed.  30 tablet  1  . simvastatin (ZOCOR) 40 MG tablet Take 1 tablet (40 mg total) by mouth at bedtime.  90 tablet  1  . sitaGLIPtin (JANUVIA) 100 MG tablet Take 1 tablet (100 mg total) by mouth daily.  90 tablet  1  . traZODone (DESYREL) 50 MG tablet Take 1 tablet (50 mg total) by mouth at bedtime.  90 tablet  3  . DISCONTD: bromocriptine (PARLODEL) 2.5 MG tablet Take 1 tablet (2.5 mg total) by mouth at bedtime.  90 tablet  1  . DISCONTD: testosterone cypionate (DEPOTESTOTERONE CYPIONATE) 200 MG/ML injection 0.25 cc im every 2 weeks, and syringes # 10  10 mL  0  . DISCONTD: traZODone (DESYREL) 50 MG tablet Take 1 tablet (50 mg total) by mouth at bedtime.  90  tablet  1    Allergies  Allergen Reactions  . Pioglitazone     REACTION: edema    Family History  Problem Relation Age of Onset  . Cancer Mother     Colon Cancer    BP 124/82  Pulse 85  Temp 99.5 F (37.5 C) (Oral)  Wt 291 lb (131.997 kg)  SpO2 97%    Review of Systems  Constitutional: Negative for fever and unexpected weight change.  HENT: Negative for hearing loss.   Eyes: Negative for visual disturbance.  Respiratory: Negative for shortness of breath.   Cardiovascular: Negative for chest pain.  Gastrointestinal: Negative for anal bleeding.  Genitourinary: Negative for dysuria and hematuria.  Musculoskeletal: Negative for back pain.  Skin: Negative for rash.  Neurological: Negative for syncope and numbness.  Hematological: Does not bruise/bleed easily.  Psychiatric/Behavioral: Negative for dysphoric mood.       Objective:    Physical Exam VS: see vs page GEN: no distress HEAD: head: no deformity eyes: no periorbital swelling, no proptosis external nose and ears are normal mouth: no lesion seen NECK: supple, thyroid is not enlarged CHEST WALL: no deformity LUNGS: clear to auscultation BREASTS:  pseudogynecomastia CV: reg rate and rhythm, no murmur ABD: abdomen is soft, nontender.  no hepatosplenomegaly.  not distended.  no hernia RECTAL: normal external and internal exam.  heme neg. PROSTATE:  Normal size.  No nodule MUSCULOSKELETAL: muscle bulk and strength are grossly normal.  no obvious joint swelling.  gait is normal and steady PULSES: no carotid bruit NEURO:  cn 2-12 grossly intact.   readily moves all 4's.   SKIN:  Normal texture and temperature.  No rash or suspicious lesion is visible.   NODES:  None palpable at the neck PSYCH: alert, oriented x3.  Does not appear anxious nor depressed.     Assessment & Plan:  Wellness visit today, with problems stable, except as noted.    SEPARATE EVALUATION FOLLOWS--EACH PROBLEM HERE IS NEW, NOT RESPONDING TO TREATMENT, OR POSES SIGNIFICANT RISK TO THE PATIENT'S HEALTH: HISTORY OF THE PRESENT ILLNESS: Pt returns for f/u of dm.  no cbg record, but states cbg's are well-controlled.  pt states he feels well in general. PAST MEDICAL HISTORY reviewed and up to date today REVIEW OF SYSTEMS: denies hypoglycemia PHYSICAL EXAMINATION: VITAL SIGNS:  See vs page GENERAL: no distress Pulses: dorsalis pedis intact bilat.   Feet: no deformity.  no ulcer on the feet.  feet are of normal color and temp.  no edema Neuro: sensation is intact to touch on the feet LAB/XRAY RESULTS: Lab Results  Component Value Date   HGBA1C 7.1* 12/07/2011  IMPRESSION: DM: Needs increased rx, if it can be done with a regimen that avoids or minimizes hypoglycemia. PLAN: See instruction page

## 2011-12-10 NOTE — Patient Instructions (Addendum)
Increase bromocriptine to 1 pill, twice a day. You should take the testosterone shots every 2 weeks.  Here is a prescription refill.  Please come back for a follow-up appointment in 3 months.    please consider these measures for your health:  minimize alcohol.  do not use tobacco products.  have a colonoscopy at least every 10 years from age 56.  keep firearms safely stored.  always use seat belts.  have working smoke alarms in your home.  see an eye doctor and dentist regularly.  never drive under the influence of alcohol or drugs (including prescription drugs).

## 2011-12-15 ENCOUNTER — Other Ambulatory Visit: Payer: Self-pay | Admitting: *Deleted

## 2011-12-15 MED ORDER — METFORMIN HCL ER 500 MG PO TB24: 1000.0000 mg | ORAL_TABLET | Freq: Two times a day (BID) | ORAL | Status: DC

## 2012-01-05 ENCOUNTER — Ambulatory Visit (INDEPENDENT_AMBULATORY_CARE_PROVIDER_SITE_OTHER): Payer: BC Managed Care – PPO | Admitting: *Deleted

## 2012-01-05 DIAGNOSIS — Z23 Encounter for immunization: Secondary | ICD-10-CM

## 2012-03-07 ENCOUNTER — Telehealth: Payer: Self-pay | Admitting: Endocrinology

## 2012-03-07 DIAGNOSIS — E291 Testicular hypofunction: Secondary | ICD-10-CM

## 2012-03-07 MED ORDER — TESTOSTERONE CYPIONATE 200 MG/ML IM SOLN: INTRAMUSCULAR | Status: DC

## 2012-03-07 NOTE — Telephone Encounter (Signed)
Called pt to inquire about the actual dose that he is injecting - 0.25 mL (50 mg) every 2 weeks seems a low dose. I tried his phone nrs few times, no answer. Will refill this dose for now, pt is see Dr. Everardo All in ~2 weeks and this issue can be evaluated then.

## 2012-03-17 ENCOUNTER — Ambulatory Visit: Payer: BC Managed Care – PPO | Admitting: Endocrinology

## 2012-03-24 ENCOUNTER — Encounter: Payer: Self-pay | Admitting: Endocrinology

## 2012-03-24 ENCOUNTER — Ambulatory Visit (INDEPENDENT_AMBULATORY_CARE_PROVIDER_SITE_OTHER): Payer: BC Managed Care – PPO | Admitting: Endocrinology

## 2012-03-24 VITALS — BP 126/80 | Temp 97.8°F | Wt 290.0 lb

## 2012-03-24 DIAGNOSIS — Z79899 Other long term (current) drug therapy: Secondary | ICD-10-CM

## 2012-03-24 DIAGNOSIS — E119 Type 2 diabetes mellitus without complications: Secondary | ICD-10-CM

## 2012-03-24 LAB — HEMOGLOBIN A1C: Hgb A1c MFr Bld: 6.7 % — ABNORMAL HIGH (ref <5.7)

## 2012-03-24 MED ORDER — TERBINAFINE HCL 250 MG PO TABS: 250.0000 mg | ORAL_TABLET | Freq: Every day | ORAL | Status: DC

## 2012-03-24 NOTE — Patient Instructions (Addendum)
blood tests are being requested for you today.  We'll contact you with results. Please come back for a follow-up appointment in 3-4 months.   i have sent a prescription to your pharmacy, for the toenail fungus. Based on the results, we may add acarbose.

## 2012-03-24 NOTE — Progress Notes (Signed)
Subjective:    Patient ID: David Lindsey, male    DOB: [birth year], 56 y.o.   MRN: [id number]  HPI Pt returns for f/u of type 2 DM (dx'ed 2002; complicated by renal insufficiency).  He says cbg's are well-controlled.   Pt states few years of moderate thickening of the toenails, but no assoc pain Past Medical History  Diagnosis Date  . DIABETES MELLITUS, TYPE II 11/10/2006  . 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 HYPERCHOLESTEROLEMIA 12/22/2007  . HYPOKALEMIA 06/06/2008  . ANEMIA, IRON DEFICIENCY 12/26/2007  . Pancytopenia 06/06/2008  . ALCOHOLISM 12/22/2007  . HYPERTENSION 11/10/2006  . ESOPHAGITIS, REFLUX 12/22/2007  . RENAL INSUFFICIENCY 11/10/2006  . Leukopenia   . Neuropathy     Chronic Neuropathy leg pain    Past Surgical History  Procedure Date  . Appendectomy   . Esophagogastroduodenoscopy 08/20/2004  . Electrocardiogram 11/07/2006    History   Social History  . Marital Status: Married    Spouse Name: N/A    Number of Children: N/A  . 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 Smoking status: Never Smoker   . Smokeless tobacco: Not on file  . Alcohol Use: No  . Drug Use:   . Sexually Active:    Other Topics Concern  . Not on file   Social History Narrative  . No narrative on file    Current Outpatient Prescriptions on File Prior to Visit  Medication Sig Dispense Refill  . aspirin 81 MG tablet Take 81 mg by mouth daily.        . bromocriptine (PARLODEL) 2.5 MG tablet Take 1 tablet (2.5 mg total) by mouth 2 (two) times daily.  180 tablet  3  . clotrimazole-betamethasone (LOTRISONE) cream Apply 3x a day as needed for rash  30 g  0  . clotrimazole-betamethasone (LOTRISONE) cream Apply topically 3 (three) times daily as needed. For rash  45 g  3  . fish oil-omega-3 fatty acids 1000 MG capsule Take 3 capsules by mouth daily.        Marland Kitchen gabapentin (NEURONTIN) 300 MG capsule Take 2 capsules by mouth three  times daily  540 capsule  3  . glucose blood (ONE TOUCH ULTRA TEST) test strip Use as instructed check blood sugar two times daily       . lisinopril-hydrochlorothiazide (PRINZIDE,ZESTORETIC) 10-12.5 MG per tablet Take 1 tablet by mouth daily.  90 tablet  1  . metFORMIN (GLUCOPHAGE-XR) 500 MG 24 hr tablet Take 2 tablets (1,000 mg total) by mouth 2 (two) times daily.  360 tablet  3  . omeprazole (PRILOSEC) 20 MG capsule Take 1 capsule (20 mg total) by mouth daily.  90 capsule  3  . potassium chloride SA (K-DUR,KLOR-CON) 20 MEQ tablet Take 1 tablet (20 mEq total) by mouth daily.  90 tablet  3  . sildenafil (VIAGRA) 100 MG tablet Take 1 tablet (100 mg total) by mouth as needed.  30 tablet  1  . simvastatin (ZOCOR) 40 MG tablet Take 1 tablet (40 mg total) by mouth at bedtime.  90 tablet  3  . sitaGLIPtin (JANUVIA) 100 MG tablet Take 1 tablet (100 mg total) by mouth daily.  90 tablet  1  . testosterone cypionate (DEPOTESTOTERONE CYPIONATE) 200 MG/ML injection INJECT 0.25 CC IMTRAMUSCULARY EVERY TWO WEEKS  1 mL  0  . traZODone (DESYREL) 50 MG tablet Take 1 tablet (50 mg  total) by mouth at bedtime.  90 tablet  3    Allergies  Allergen Reactions  . Pioglitazone     REACTION: edema    Family History  Problem Relation Age of Onset  . Cancer Mother     Colon Cancer    BP 126/80  Temp 97.8 F (36.6 C) (Oral)  Wt 290 lb (131.543 kg)  SpO2 99%  Review of Systems He has lost weight, due to his efforts.  Denies sob.      Objective:   Physical Exam Pulses: dorsalis pedis intact bilat.   Feet: no deformity.  no ulcer on the feet.  feet are of normal color and temp.  1+ bilat leg edema.  onychomycosis of toenails.   Neuro: sensation is intact to touch on the feet.    Lab Results  Component Value Date   HGBA1C 6.7* 03/24/2012      Assessment & Plan:  DM is well-controlled Onychomycosis, new

## 2012-04-12 HISTORY — PX: EYE SURGERY: SHX253

## 2012-05-05 ENCOUNTER — Telehealth: Payer: Self-pay

## 2012-05-05 ENCOUNTER — Other Ambulatory Visit: Payer: Self-pay

## 2012-05-05 MED ORDER — SITAGLIPTIN PHOSPHATE 100 MG PO TABS: 100.0000 mg | ORAL_TABLET | Freq: Every day | ORAL | Status: DC

## 2012-05-05 NOTE — Telephone Encounter (Signed)
Pt calling for refill of januvia 100mg .  Three month supply to [facility]

## 2012-06-19 ENCOUNTER — Other Ambulatory Visit: Payer: Self-pay | Admitting: Endocrinology

## 2012-07-30 IMAGING — CT CT ABD-PELV W/O CM
1 series · 15 of 32 positions shown, 19 images · non-contrast
Comparison: Abdominal series dated 04/15/2007

CLINICAL DATA: 54 year-old with the left flank pain.

CT ABDOMEN AND PELVIS WITHOUT CONTRAST
TECHNIQUE: Multidetector CT imaging of the abdomen and pelvis was
performed following the standard protocol without intravenous
contrast.

[Series 4: lung windows · axial · 0.74mm/px · z∈[+879,+1339]mm · 15 of 103 slices shown, 19 images]
[im 7/103  soft-tissue]
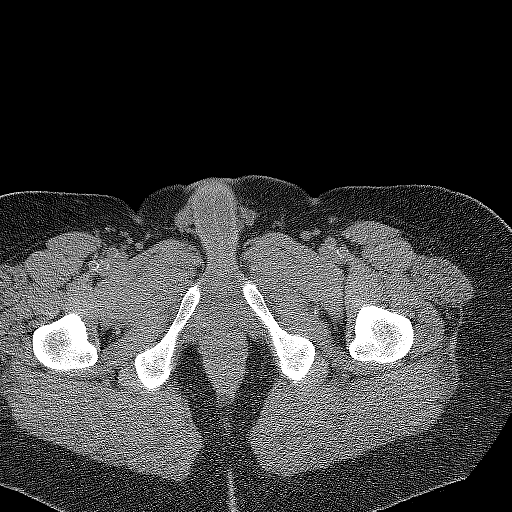
[im 7/103  bone]
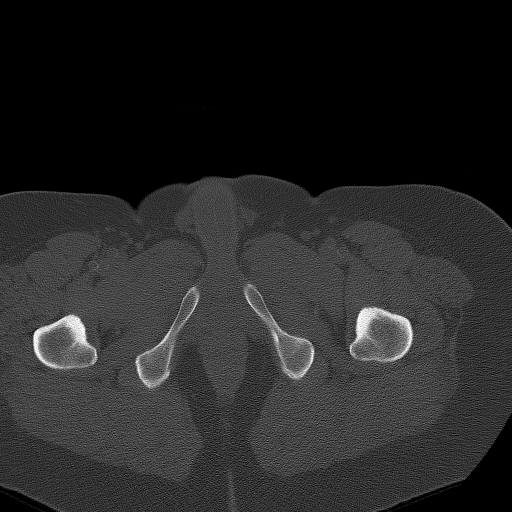
[im 14/103  soft-tissue]
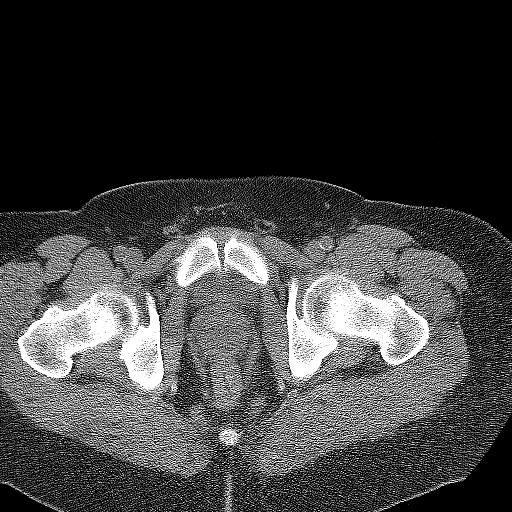
[im 20/103  soft-tissue]
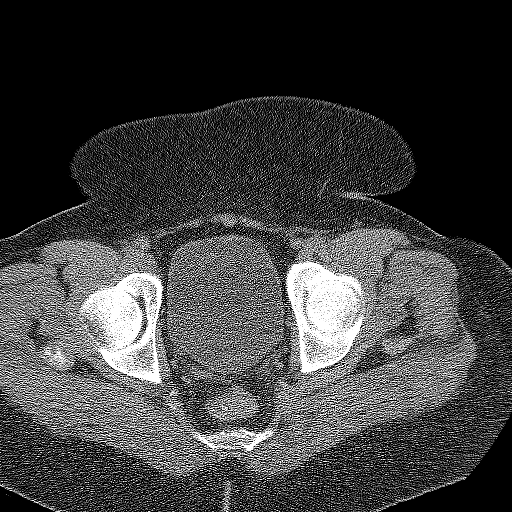
[im 30/103  soft-tissue]
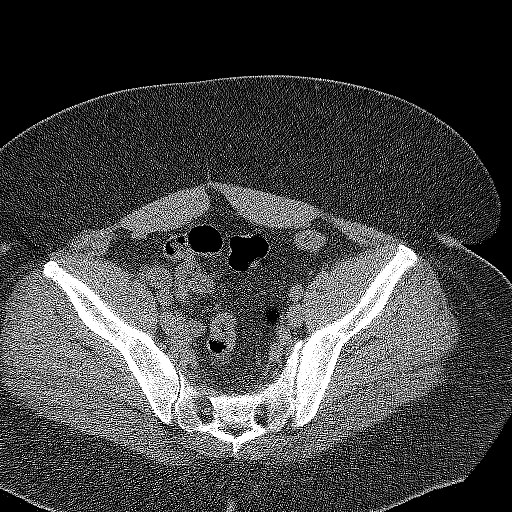
[im 37/103  soft-tissue]
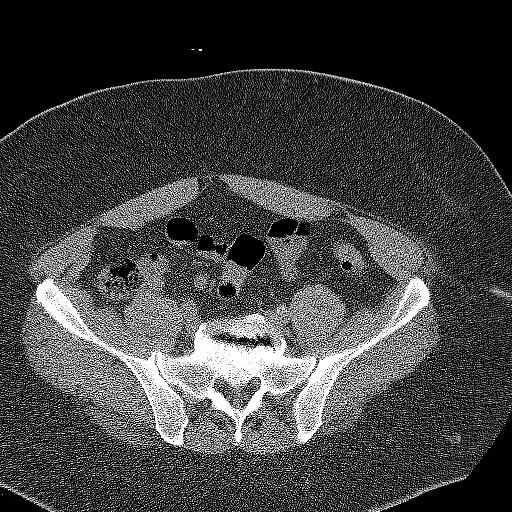
[im 43/103  soft-tissue]
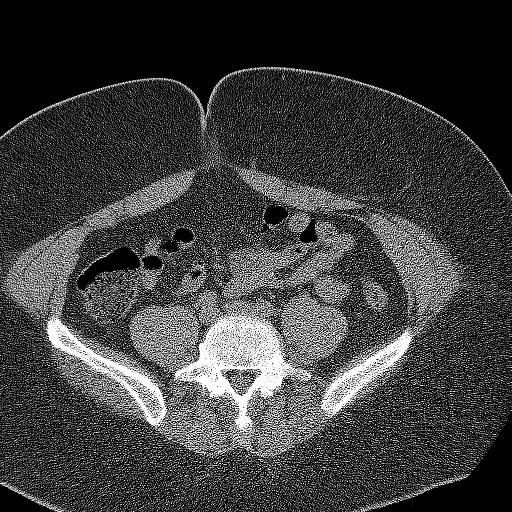
[im 53/103  soft-tissue]
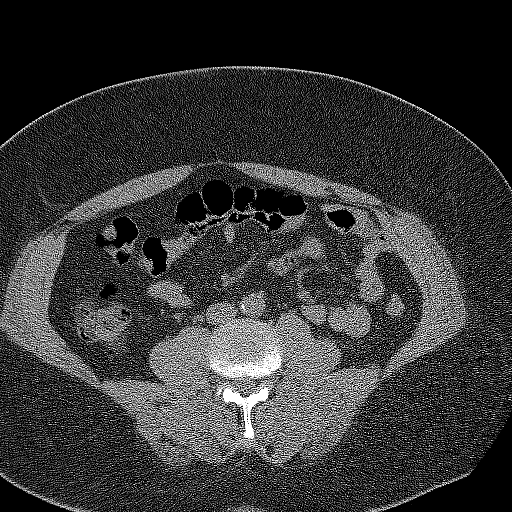
[im 60/103  soft-tissue]
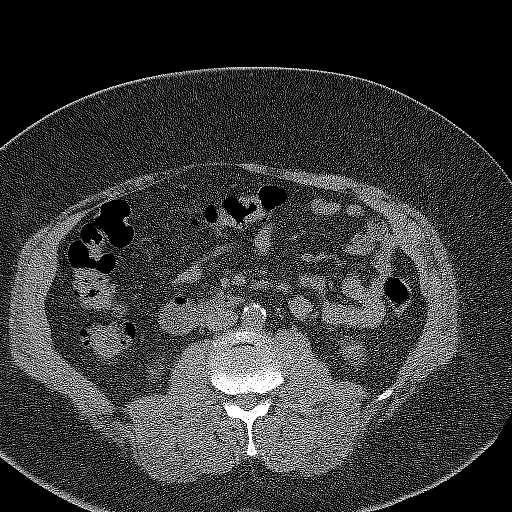
[im 66/103  soft-tissue]
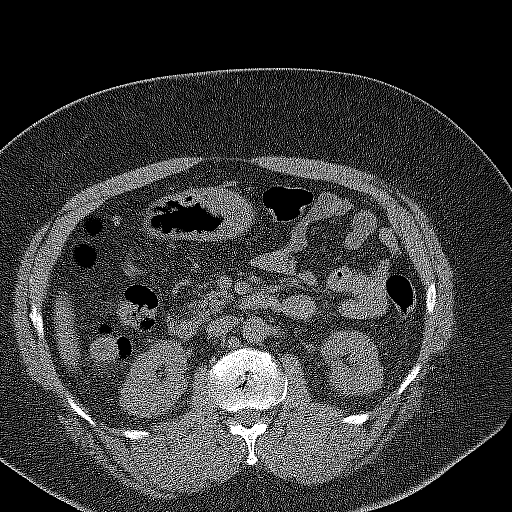
[im 66/103  bone]
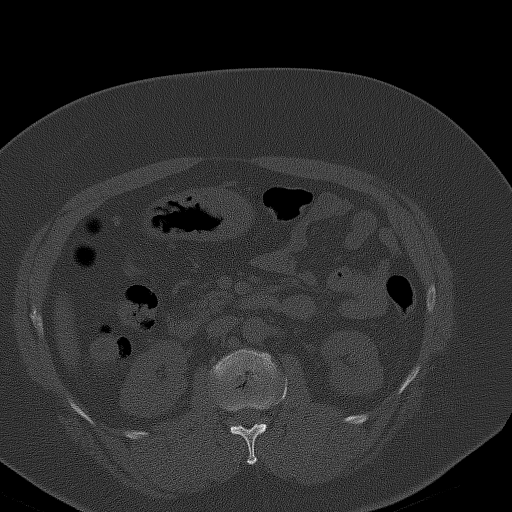
[im 73/103  soft-tissue]
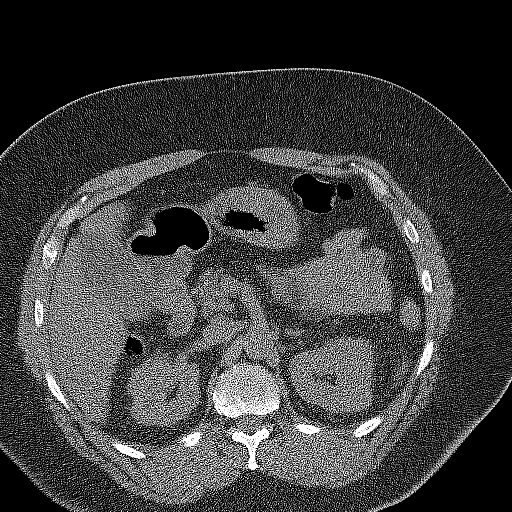
[im 83/103  soft-tissue]
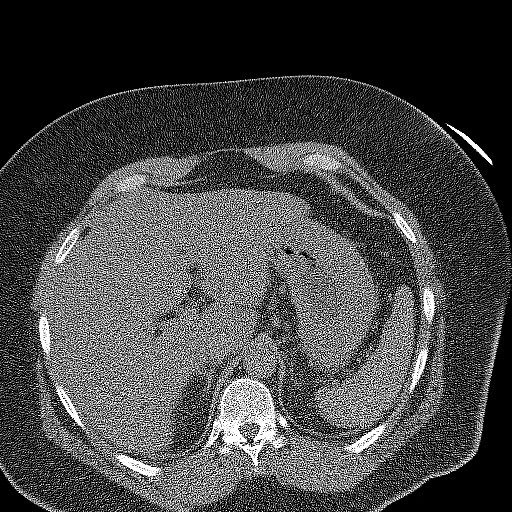
[im 89/103  soft-tissue]
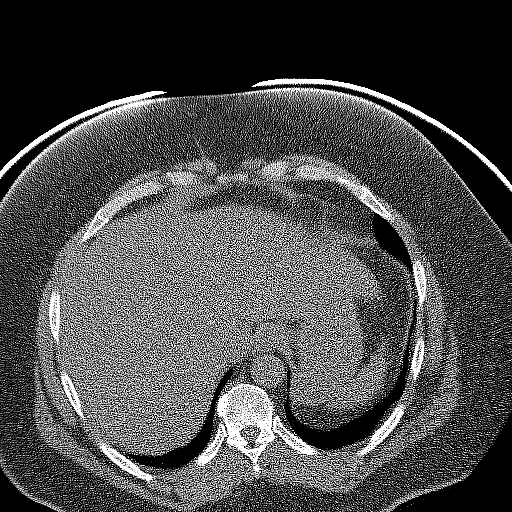
[im 89/103  lung]
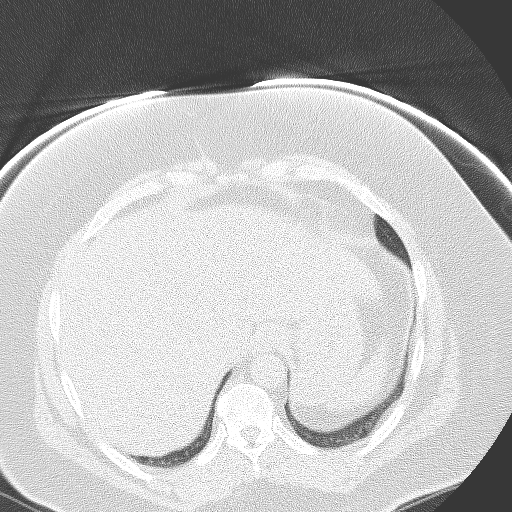
[im 93/103  lung]
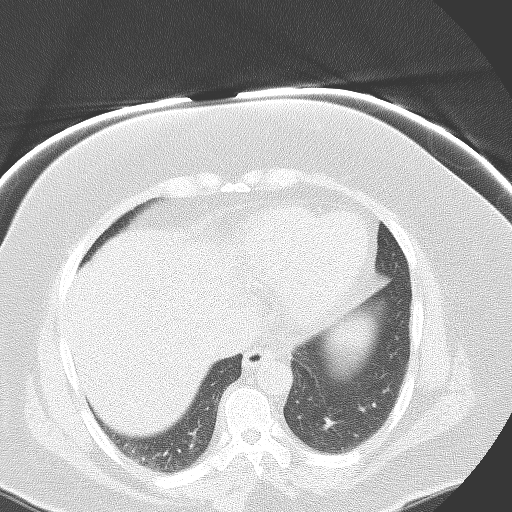
[im 96/103  soft-tissue]
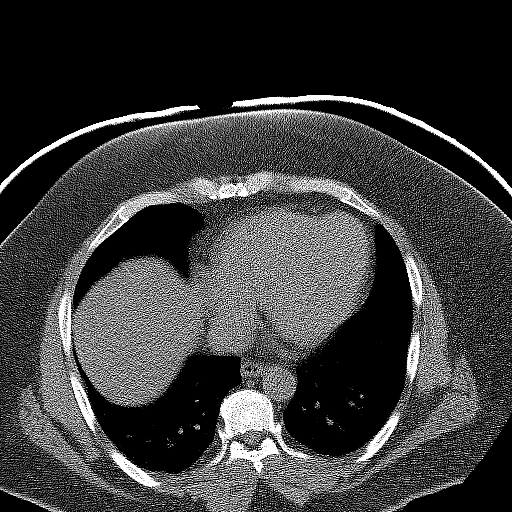
[im 96/103  lung]
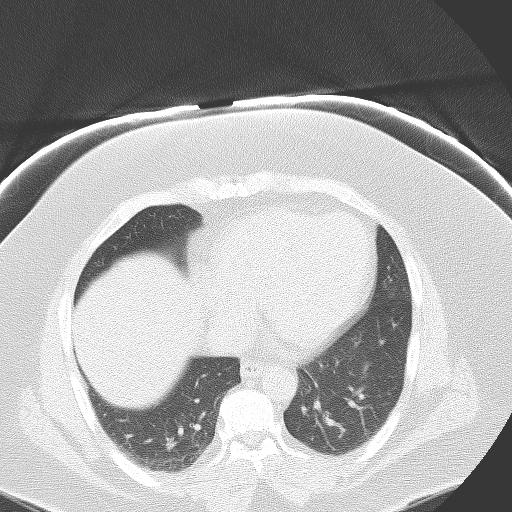
[im 99/103  lung]
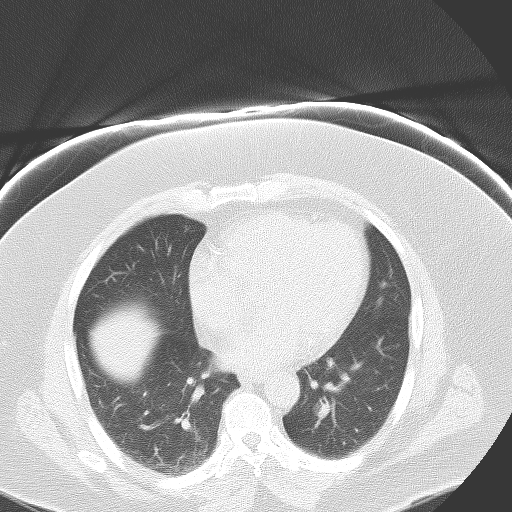

[15 of 32 positions shown; findings below may reference images not displayed]

FINDINGS: Lung bases are clear.  No evidence of free air.
Unenhanced CT was performed per clinician order.  Lack of IV
contrast limits sensitivity and specificity, especially for
evaluation of abdominal/pelvic solid viscera.

The gallbladder is distended but no surrounding inflammatory
changes.  No gross abnormality to the liver exception for focal fat
near the falciform ligament.  Normal appearance of the spleen,
pancreas and adrenal tissue.  Bilateral calcifications near the
iliac artery bifurcations are likely vascular in origin.  No
evidence of kidney stones or hydronephrosis.  No gross abnormality
to the prostate, seminal vesicles and urinary bladder.  No acute
osseous abnormality. Degenerative endplate and disc disease in the
lumbar spine.  Focal area of wall thickening in the ascending colon
is probably within normal limits and could represent peristalsis.
IMPRESSION: Negative for kidney stones or hydronephrosis.

Mild distention of the gallbladder without surrounding inflammatory
changes.  If this is an area of clinical concern, recommend
abdominal ultrasound.

Short segment of colonic wall thickening in the ascending colon is
nonspecific and probably within normal limits.  Recommend
correlating with colonoscopy history if available.

## 2012-07-31 ENCOUNTER — Encounter: Payer: Self-pay | Admitting: Endocrinology

## 2012-07-31 ENCOUNTER — Ambulatory Visit (INDEPENDENT_AMBULATORY_CARE_PROVIDER_SITE_OTHER): Payer: BC Managed Care – PPO | Admitting: Endocrinology

## 2012-07-31 VITALS — BP 120/80 | HR 92 | Wt 287.0 lb

## 2012-07-31 DIAGNOSIS — E119 Type 2 diabetes mellitus without complications: Secondary | ICD-10-CM

## 2012-07-31 NOTE — Patient Instructions (Addendum)
A diabetes blood test is being requested for you today.  We'll contact you with results.   Please come in for a regular physical appointment after 12/09/12.   check your blood sugar once a day.  vary the time of day when you check, between before the 3 meals, and at bedtime.  also check if you have symptoms of your blood sugar being too high or too low.  please keep a record of the readings and bring it to your next appointment here.  please call us sooner if your blood sugar goes below 70, or if you have a lot of readings over 200.

## 2012-07-31 NOTE — Progress Notes (Signed)
Subjective:    Patient ID: David Lindsey, male    DOB: 02-Sep-1955, 57 y.o.   MRN: [id number]  HPI Pt returns for f/u of type 2 DM (dx'ed 2002; complicated by renal insufficiency).  He says cbg's are well-controlled.  dermatofibroma Past Medical History  Diagnosis Date  . DIABETES MELLITUS, TYPE II 11/10/2006  . 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 HYPERCHOLESTEROLEMIA 12/22/2007  . HYPOKALEMIA 06/06/2008  . ANEMIA, IRON DEFICIENCY 12/26/2007  . Pancytopenia 06/06/2008  . ALCOHOLISM 12/22/2007  . HYPERTENSION 11/10/2006  . ESOPHAGITIS, REFLUX 12/22/2007  . RENAL INSUFFICIENCY 11/10/2006  . Leukopenia   . Neuropathy     Chronic Neuropathy leg pain    Past Surgical History  Procedure Laterality Date  . Appendectomy    . Esophagogastroduodenoscopy  08/20/2004  . Electrocardiogram  11/07/2006    History   Social History  . Marital Status: Married    Spouse Name: N/A    Number of Children: N/A  . 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 Smoking status: Never Smoker   . Smokeless tobacco: Not on file  . Alcohol Use: No  . Drug Use:   . Sexually Active:    Other Topics Concern  . Not on file   Social History Narrative  . No narrative on file    Current Outpatient Prescriptions on File Prior to Visit  Medication Sig Dispense Refill  . aspirin 81 MG tablet Take 81 mg by mouth daily.        . bromocriptine (PARLODEL) 2.5 MG tablet Take 1 tablet (2.5 mg total) by mouth 2 (two) times daily.  180 tablet  3  . clotrimazole-betamethasone (LOTRISONE) cream Apply 3x a day as needed for rash  30 g  0  . clotrimazole-betamethasone (LOTRISONE) cream Apply topically 3 (three) times daily as needed. For rash  45 g  3  . fish oil-omega-3 fatty acids 1000 MG capsule Take 3 capsules by mouth daily.        Marland Kitchen gabapentin (NEURONTIN) 300 MG capsule Take 2 capsules by mouth three times daily  540 capsule  3  . glucose blood (ONE  TOUCH ULTRA TEST) test strip Use as instructed check blood sugar two times daily       . lisinopril-hydrochlorothiazide (PRINZIDE,ZESTORETIC) 10-12.5 MG per tablet TAKE ONE TABLET BY MOUTH EVERY DAY  90 tablet  0  . metFORMIN (GLUCOPHAGE-XR) 500 MG 24 hr tablet Take 2 tablets (1,000 mg total) by mouth 2 (two) times daily.  360 tablet  3  . omeprazole (PRILOSEC) 20 MG capsule Take 1 capsule (20 mg total) by mouth daily.  90 capsule  3  . potassium chloride SA (K-DUR,KLOR-CON) 20 MEQ tablet Take 1 tablet (20 mEq total) by mouth daily.  90 tablet  3  . sildenafil (VIAGRA) 100 MG tablet Take 1 tablet (100 mg total) by mouth as needed.  30 tablet  1  . simvastatin (ZOCOR) 40 MG tablet Take 1 tablet (40 mg total) by mouth at bedtime.  90 tablet  3  . sitaGLIPtin (JANUVIA) 100 MG tablet Take 1 tablet (100 mg total) by mouth daily.  90 tablet  3  . terbinafine (LAMISIL) 250 MG tablet Take 1 tablet (250 mg total) by mouth daily.  90 tablet  0  . testosterone cypionate (DEPOTESTOTERONE CYPIONATE) 200 MG/ML injection INJECT 0.25 CC IMTRAMUSCULARY EVERY TWO WEEKS  1 mL  0  .  traZODone (DESYREL) 50 MG tablet Take 1 tablet (50 mg total) by mouth at bedtime.  90 tablet  3   No current facility-administered medications on file prior to visit.    Allergies  Allergen Reactions  . Pioglitazone     REACTION: edema   Family History  Problem Relation Age of Onset  . Cancer Mother     Colon Cancer   BP 120/80  Pulse 92  Wt 287 lb (130.182 kg)  BMI 40.05 kg/m2  SpO2 98%  Review of Systems denies hypoglycemia.      Objective:   Physical Exam Pulses: dorsalis pedis intact bilat.   Feet: no deformity.  no ulcer on the feet.  feet are of normal color and temp.  no edema.  There is bilateral onychomycosis.   Neuro: sensation is intact to touch on the feet  Lab Results  Component Value Date   HGBA1C 7.2* 07/31/2012      Assessment & Plan:  DM: Needs increased rx, if it can be done with a regimen that  avoids or minimizes hypoglycemia.

## 2012-08-01 MED ORDER — CANAGLIFLOZIN 100 MG PO TABS: 1.0000 | ORAL_TABLET | Freq: Every day | ORAL | Status: DC

## 2012-09-18 ENCOUNTER — Other Ambulatory Visit: Payer: Self-pay | Admitting: Endocrinology

## 2012-09-18 ENCOUNTER — Other Ambulatory Visit: Payer: Self-pay | Admitting: *Deleted

## 2012-09-18 MED ORDER — LISINOPRIL-HYDROCHLOROTHIAZIDE 10-12.5 MG PO TABS: 1.0000 | ORAL_TABLET | Freq: Every day | ORAL | Status: DC

## 2012-12-19 ENCOUNTER — Encounter: Payer: BC Managed Care – PPO | Admitting: Endocrinology

## 2012-12-26 ENCOUNTER — Encounter: Payer: BC Managed Care – PPO | Admitting: Endocrinology

## 2013-01-03 ENCOUNTER — Encounter: Payer: BC Managed Care – PPO | Admitting: Endocrinology

## 2013-01-10 ENCOUNTER — Other Ambulatory Visit: Payer: Self-pay

## 2013-01-10 MED ORDER — POTASSIUM CHLORIDE CRYS ER 20 MEQ PO TBCR: 20.0000 meq | EXTENDED_RELEASE_TABLET | Freq: Every day | ORAL | Status: DC

## 2013-01-10 MED ORDER — OMEPRAZOLE 20 MG PO CPDR: 20.0000 mg | DELAYED_RELEASE_CAPSULE | Freq: Every day | ORAL | Status: DC

## 2013-01-10 MED ORDER — TRAZODONE HCL 50 MG PO TABS: 50.0000 mg | ORAL_TABLET | Freq: Every day | ORAL | Status: DC

## 2013-01-10 MED ORDER — METFORMIN HCL ER 500 MG PO TB24: 1000.0000 mg | ORAL_TABLET | Freq: Two times a day (BID) | ORAL | Status: DC

## 2013-01-17 ENCOUNTER — Encounter: Payer: BC Managed Care – PPO | Admitting: Endocrinology

## 2013-01-18 ENCOUNTER — Telehealth: Payer: Self-pay | Admitting: Endocrinology

## 2013-01-18 ENCOUNTER — Ambulatory Visit (INDEPENDENT_AMBULATORY_CARE_PROVIDER_SITE_OTHER): Payer: BC Managed Care – PPO | Admitting: Endocrinology

## 2013-01-18 ENCOUNTER — Encounter: Payer: Self-pay | Admitting: Endocrinology

## 2013-01-18 VITALS — BP 126/76 | HR 90 | Ht 71.0 in | Wt 297.0 lb

## 2013-01-18 DIAGNOSIS — I10 Essential (primary) hypertension: Secondary | ICD-10-CM

## 2013-01-18 DIAGNOSIS — E7800 Pure hypercholesterolemia, unspecified: Secondary | ICD-10-CM

## 2013-01-18 DIAGNOSIS — E876 Hypokalemia: Secondary | ICD-10-CM

## 2013-01-18 DIAGNOSIS — Z23 Encounter for immunization: Secondary | ICD-10-CM

## 2013-01-18 DIAGNOSIS — E119 Type 2 diabetes mellitus without complications: Secondary | ICD-10-CM

## 2013-01-18 DIAGNOSIS — E1129 Type 2 diabetes mellitus with other diabetic kidney complication: Secondary | ICD-10-CM | POA: Insufficient documentation

## 2013-01-18 DIAGNOSIS — N1832 Chronic kidney disease, stage 3b: Secondary | ICD-10-CM | POA: Insufficient documentation

## 2013-01-18 DIAGNOSIS — E291 Testicular hypofunction: Secondary | ICD-10-CM

## 2013-01-18 DIAGNOSIS — Z79899 Other long term (current) drug therapy: Secondary | ICD-10-CM

## 2013-01-18 DIAGNOSIS — Z0000 Encounter for general adult medical examination without abnormal findings: Secondary | ICD-10-CM

## 2013-01-18 DIAGNOSIS — Z125 Encounter for screening for malignant neoplasm of prostate: Secondary | ICD-10-CM | POA: Insufficient documentation

## 2013-01-18 DIAGNOSIS — D509 Iron deficiency anemia, unspecified: Secondary | ICD-10-CM

## 2013-01-18 DIAGNOSIS — N259 Disorder resulting from impaired renal tubular function, unspecified: Secondary | ICD-10-CM

## 2013-01-18 LAB — BASIC METABOLIC PANEL
CO2: 26 mEq/L (ref 19–32)
Calcium: 9.5 mg/dL (ref 8.4–10.5)
Creatinine, Ser: 1.4 mg/dL (ref 0.4–1.5)
Potassium: 4.5 mEq/L (ref 3.5–5.1)
Sodium: 137 mEq/L (ref 135–145)

## 2013-01-18 LAB — URINALYSIS, ROUTINE W REFLEX MICROSCOPIC
Bilirubin Urine: NEGATIVE
Hgb urine dipstick: NEGATIVE
Ketones, ur: NEGATIVE
Nitrite: NEGATIVE
Total Protein, Urine: NEGATIVE
Urine Glucose: NEGATIVE

## 2013-01-18 LAB — TSH: TSH: 0.85 u[IU]/mL (ref 0.35–5.50)

## 2013-01-18 LAB — CBC WITH DIFFERENTIAL/PLATELET
Basophils Relative: 0.4 % (ref 0.0–3.0)
Eosinophils Relative: 4.7 % (ref 0.0–5.0)
HCT: 41 % (ref 39.0–52.0)
Hemoglobin: 13.7 g/dL (ref 13.0–17.0)
Lymphocytes Relative: 33.7 % (ref 12.0–46.0)
Lymphs Abs: 1.3 10*3/uL (ref 0.7–4.0)
MCV: 84.8 fl (ref 78.0–100.0)
Monocytes Absolute: 0.3 10*3/uL (ref 0.1–1.0)
Neutro Abs: 2.1 10*3/uL (ref 1.4–7.7)
Platelets: 148 10*3/uL — ABNORMAL LOW (ref 150.0–400.0)
RDW: 14 % (ref 11.5–14.6)
WBC: 4 10*3/uL — ABNORMAL LOW (ref 4.5–10.5)

## 2013-01-18 LAB — LIPID PANEL
Cholesterol: 133 mg/dL (ref 0–200)
LDL Cholesterol: 74 mg/dL (ref 0–99)
Total CHOL/HDL Ratio: 3
Triglycerides: 102 mg/dL (ref 0.0–149.0)

## 2013-01-18 LAB — TESTOSTERONE: Testosterone: 134.85 ng/dL — ABNORMAL LOW (ref 350.00–890.00)

## 2013-01-18 LAB — HEPATIC FUNCTION PANEL
ALT: 32 U/L (ref 0–53)
AST: 30 U/L (ref 0–37)
Albumin: 4.2 g/dL (ref 3.5–5.2)
Bilirubin, Direct: 0 mg/dL (ref 0.0–0.3)
Total Protein: 7.1 g/dL (ref 6.0–8.3)

## 2013-01-18 LAB — HEMOGLOBIN A1C: Hgb A1c MFr Bld: 7.7 % — ABNORMAL HIGH (ref 4.6–6.5)

## 2013-01-18 LAB — MICROALBUMIN / CREATININE URINE RATIO
Creatinine,U: 33.1 mg/dL
Microalb Creat Ratio: 1.5 mg/g (ref 0.0–30.0)

## 2013-01-18 MED ORDER — NATEGLINIDE 120 MG PO TABS: 120.0000 mg | ORAL_TABLET | Freq: Three times a day (TID) | ORAL | Status: DC

## 2013-01-18 MED ORDER — TESTOSTERONE CYPIONATE 200 MG/ML IM SOLN: 200.0000 mg | INTRAMUSCULAR | Status: DC

## 2013-01-18 NOTE — Progress Notes (Signed)
Subjective:    Patient ID: David Lindsey, male    DOB: 02-02-1956, 57 y.o.   MRN: [id number]  HPI Pt is here for regular wellness examination, and is feeling pretty well in general, and says chronic med probs are stable, except as noted below Past Medical History  Diagnosis Date  . DIABETES MELLITUS, TYPE II 11/10/2006  . 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 HYPERCHOLESTEROLEMIA 12/22/2007  . HYPOKALEMIA 06/06/2008  . ANEMIA, IRON DEFICIENCY 12/26/2007  . Pancytopenia 06/06/2008  . ALCOHOLISM 12/22/2007  . HYPERTENSION 11/10/2006  . ESOPHAGITIS, REFLUX 12/22/2007  . RENAL INSUFFICIENCY 11/10/2006  . Leukopenia   . Neuropathy     Chronic Neuropathy leg pain    Past Surgical History  Procedure Laterality Date  . Appendectomy    . Esophagogastroduodenoscopy  08/20/2004  . Electrocardiogram  11/07/2006    History   Social History  . Marital Status: Married    Spouse Name: N/A    Number of Children: N/A  . 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 Smoking status: Never Smoker   . Smokeless tobacco: Not on file  . Alcohol Use: No  . Drug Use:   . Sexual Activity:    Other Topics Concern  . Not on file   Social History Narrative  . No narrative on file    Current Outpatient Prescriptions on File Prior to Visit  Medication Sig Dispense Refill  . aspirin 81 MG tablet Take 81 mg by mouth daily.        . bromocriptine (PARLODEL) 2.5 MG tablet Take 1 tablet (2.5 mg total) by mouth 2 (two) times daily.  180 tablet  3  . Canagliflozin (INVOKANA) 100 MG TABS Take 1 tablet (100 mg total) by mouth daily.  30 tablet  11  . clotrimazole-betamethasone (LOTRISONE) cream Apply 3x a day as needed for rash  30 g  0  . fish oil-omega-3 fatty acids 1000 MG capsule Take 3 capsules by mouth daily.        Marland Kitchen gabapentin (NEURONTIN) 300 MG capsule Take 2 capsules by mouth three times daily  540 capsule  3  . glucose blood (ONE  TOUCH ULTRA TEST) test strip Use as instructed check blood sugar two times daily       . lisinopril-hydrochlorothiazide (PRINZIDE,ZESTORETIC) 10-12.5 MG per tablet Take 1 tablet by mouth daily.  90 tablet  1  . metFORMIN (GLUCOPHAGE-XR) 500 MG 24 hr tablet Take 2 tablets (1,000 mg total) by mouth 2 (two) times daily.  360 tablet  3  . omeprazole (PRILOSEC) 20 MG capsule Take 1 capsule (20 mg total) by mouth daily.  90 capsule  3  . potassium chloride SA (K-DUR,KLOR-CON) 20 MEQ tablet Take 1 tablet (20 mEq total) by mouth daily.  90 tablet  3  . sildenafil (VIAGRA) 100 MG tablet Take 1 tablet (100 mg total) by mouth as needed.  30 tablet  1  . sitaGLIPtin (JANUVIA) 100 MG tablet Take 1 tablet (100 mg total) by mouth daily.  90 tablet  3  . traZODone (DESYREL) 50 MG tablet Take 1 tablet (50 mg total) by mouth at bedtime.  90 tablet  3  . simvastatin (ZOCOR) 40 MG tablet Take 1 tablet (40 mg total) by mouth at bedtime.  90 tablet  3   No current facility-administered medications on file prior to visit.    Allergies  Allergen  Reactions  . Pioglitazone     REACTION: edema    Family History  Problem Relation Age of Onset  . Cancer Mother     Colon Cancer    BP 126/76  Pulse 90  Ht 5\' 11"  (1.803 m)  Wt 297 lb (134.718 kg)  BMI 41.44 kg/m2  SpO2 97%     Review of Systems  Constitutional: Negative for fever and unexpected weight change.  HENT: Negative for hearing loss.   Eyes: Negative for visual disturbance.  Respiratory: Negative for shortness of breath.   Cardiovascular: Negative for chest pain.  Gastrointestinal: Negative for anal bleeding.  Endocrine: Negative for cold intolerance.  Genitourinary: Negative for hematuria and difficulty urinating.  Musculoskeletal: Negative for back pain.  Skin: Negative for rash.  Allergic/Immunologic: Negative for environmental allergies.  Neurological: Negative for syncope and headaches.  Hematological: Does not bruise/bleed easily.   Psychiatric/Behavioral: Negative for dysphoric mood.       Objective:   Physical Exam VS: see vs page GEN: no distress HEAD: head: no deformity eyes: no periorbital swelling, no proptosis external nose and ears are normal mouth: no lesion seen NECK: supple, thyroid is not enlarged CHEST WALL: no deformity LUNGS: clear to auscultation BREASTS:  No gynecomastia ABD: abdomen is soft, nontender.  no hepatosplenomegaly.  not distended.  no hernia. RECTAL: normal external and internal exam.  heme neg. PROSTATE:  Normal size.  No nodule MUSCULOSKELETAL: muscle bulk and strength are grossly normal.  no obvious joint swelling.  gait is normal and steady PULSES: no carotid bruit NEURO:  cn 2-12 grossly intact.   readily moves all 4's.  SKIN:  Normal texture and temperature.  No rash or suspicious lesion is visible.   NODES:  None palpable at the neck PSYCH: alert, oriented x3.  Does not appear anxious nor depressed.      Assessment & Plan:  Wellness visit today, with problems stable, except as noted.      SEPARATE EVALUATION FOLLOWS--EACH PROBLEM HERE IS NEW, NOT RESPONDING TO TREATMENT, OR POSES SIGNIFICANT RISK TO THE PATIENT'S HEALTH: HISTORY OF THE PRESENT ILLNESS: Pt returns for f/u of type 2 DM (dx'ed 2002; he has mild if any neuropathy of the lower extremities, but he has associated renal insufficiency; edema precludes actos; and metformin use is limited by renal insufficiency). Last testosterone injection was 6 weeks ago.  PAST MEDICAL HISTORY reviewed and up to date today REVIEW OF SYSTEMS: denies hypoglycemia and numbness PHYSICAL EXAMINATION: VITAL SIGNS:  See vs page GENERAL: no distress HEART:  Regular rate and rhythm without murmurs noted. Normal S1,S2.   LAB/XRAY RESULTS: Lab Results  Component Value Date   HGBA1C 7.7* 01/18/2013  IMPRESSION: DM: he needs increased rx.  We discussed the nine oral agents available for type 2 diabetes.  This regimen gives the  best risk-benefit ratio.  i added starlix. Hypogonadism: therapy limited by noncompliance.  i'll do the best i can.  i gave pt new rx, adn advised him to take testosterone injections as rx'ed.   PLAN: See instruction page

## 2013-01-18 NOTE — Telephone Encounter (Signed)
please call patient: a1c is slightly high Please reduce metformin to just 2 pills per day, due to kidneys Also, i have sent a prescription to your pharmacy, to add "nateglinide."  You take this one with each meal, so carry some pills around with you in a container attached to your keychain.

## 2013-01-18 NOTE — Patient Instructions (Addendum)
please consider these measures for your health:  minimize alcohol.  do not use tobacco products.  have a colonoscopy at least every 10 years from age 57.  keep firearms safely stored.  always use seat belts.  have working smoke alarms in your home.  see an eye doctor and dentist regularly.  never drive under the influence of alcohol or drugs (including prescription drugs).   Please come back for a follow-up appointment in 6 months. 

## 2013-01-19 ENCOUNTER — Telehealth: Payer: Self-pay | Admitting: Endocrinology

## 2013-01-19 NOTE — Telephone Encounter (Signed)
Pt advised and states an understandiing

## 2013-01-29 ENCOUNTER — Telehealth: Payer: Self-pay

## 2013-01-29 NOTE — Telephone Encounter (Signed)
Refill request for Neurontin. Please advise.

## 2013-01-29 NOTE — Telephone Encounter (Signed)
Please refill prn 

## 2013-01-30 ENCOUNTER — Other Ambulatory Visit: Payer: Self-pay | Admitting: *Deleted

## 2013-01-30 MED ORDER — GABAPENTIN 300 MG PO CAPS: ORAL_CAPSULE | ORAL | Status: DC

## 2013-01-30 NOTE — Telephone Encounter (Signed)
rx sent

## 2013-02-22 ENCOUNTER — Telehealth: Payer: Self-pay | Admitting: *Deleted

## 2013-02-22 NOTE — Telephone Encounter (Signed)
Pt says he spoke with you on his last visit about his problem with constipation, he said you told him to try miralax but he said that has not helped and would like a referral to a specialist within [place].  Please advise

## 2013-02-23 MED ORDER — LACTULOSE 20 GM/30ML PO SOLN: 30.0000 mL | Freq: Three times a day (TID) | ORAL | Status: DC

## 2013-02-23 NOTE — Telephone Encounter (Signed)
i have sent a prescription to your pharmacy You take it tid When it starts to work, reduce to qd

## 2013-02-23 NOTE — Telephone Encounter (Signed)
Left message with instructions on vm

## 2013-02-27 ENCOUNTER — Ambulatory Visit: Payer: BC Managed Care – PPO | Admitting: Endocrinology

## 2013-03-12 ENCOUNTER — Other Ambulatory Visit: Payer: Self-pay | Admitting: *Deleted

## 2013-03-12 MED ORDER — SIMVASTATIN 40 MG PO TABS: 40.0000 mg | ORAL_TABLET | Freq: Every day | ORAL | Status: DC

## 2013-03-12 MED ORDER — BROMOCRIPTINE MESYLATE 2.5 MG PO TABS: 2.5000 mg | ORAL_TABLET | Freq: Two times a day (BID) | ORAL | Status: DC

## 2013-03-12 MED ORDER — LISINOPRIL-HYDROCHLOROTHIAZIDE 10-12.5 MG PO TABS: 1.0000 | ORAL_TABLET | Freq: Every day | ORAL | Status: DC

## 2013-03-13 ENCOUNTER — Ambulatory Visit (INDEPENDENT_AMBULATORY_CARE_PROVIDER_SITE_OTHER): Payer: BC Managed Care – PPO | Admitting: Endocrinology

## 2013-03-13 ENCOUNTER — Encounter: Payer: Self-pay | Admitting: Endocrinology

## 2013-03-13 VITALS — BP 122/78 | HR 86 | Temp 97.8°F | Ht 71.0 in | Wt 292.2 lb

## 2013-03-13 DIAGNOSIS — K5900 Constipation, unspecified: Secondary | ICD-10-CM

## 2013-03-13 MED ORDER — LACTULOSE 20 GM/30ML PO SOLN: 60.0000 mL | Freq: Two times a day (BID) | ORAL | Status: DC

## 2013-03-13 NOTE — Patient Instructions (Signed)
Please increase the lactulose.  i have sent a prescription to your pharmacy Also, take miralax 17 grams daily, and a stool softener twice a day (both non-prescription). Try to minimize laxatives, as they are habit-forming.    Constipation, Adult Constipation is when a person:  Poops (bowel movement) less than 3 times a week.  Has a hard time pooping.  Has poop that is dry, hard, or bigger than normal. HOME CARE   Eat more fiber, such as fruits, vegetables, whole grains like brown rice, and beans.  Eat less fatty foods and sugar. This includes [place] fries, hamburgers, cookies, candy, and soda.  If you are not getting enough fiber from food, take products with added fiber in them (supplements).  Drink enough fluid to keep your pee (urine) clear or pale yellow.  Go to the restroom when you feel like you need to poop. Do not hold it.  Only take medicine as told by your doctor. Do not take medicines that help you poop (laxatives) without talking to your doctor first.  Exercise on a regular basis, or as told by your doctor. GET HELP RIGHT AWAY IF:   You have bright red blood in your poop (stool).  Your constipation lasts more than 4 days or gets worse.  You have belly (abdomen) or butt (rectal) pain.  You have thin poop (as thin as a pencil).  You lose weight, and it cannot be explained. MAKE SURE YOU:   Understand these instructions.  Will watch your condition.  Will get help right away if you are not doing well or get worse. Document Released: 09/15/2007 Document Revised: 06/21/2011 Document Reviewed: 03/02/2011 [facility] Patient Information 2014 [place], [place].

## 2013-03-13 NOTE — Progress Notes (Signed)
Subjective:    Patient ID: David Lindsey, male    DOB: 04/09/1956, 57 y.o.   MRN: [id number]  HPI Pt states 6 months of moderate constipation, but no pain at the abdomen.  No assoc brbpr.  He says he got insufficient relief from the lactulose.  He says he has to take a laxative in order to have BM.  He is up to date on his colonoscopy.   Past Medical History  Diagnosis Date  . DIABETES MELLITUS, TYPE II 11/10/2006  . 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 HYPERCHOLESTEROLEMIA 12/22/2007  . HYPOKALEMIA 06/06/2008  . ANEMIA, IRON DEFICIENCY 12/26/2007  . Pancytopenia 06/06/2008  . ALCOHOLISM 12/22/2007  . HYPERTENSION 11/10/2006  . ESOPHAGITIS, REFLUX 12/22/2007  . RENAL INSUFFICIENCY 11/10/2006  . Leukopenia   . Neuropathy     Chronic Neuropathy leg pain    Past Surgical History  Procedure Laterality Date  . Appendectomy    . Esophagogastroduodenoscopy  08/20/2004  . Electrocardiogram  11/07/2006    History   Social History  . Marital Status: Married    Spouse Name: N/A    Number of Children: N/A  . 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 Smoking status: Never Smoker   . Smokeless tobacco: Not on file  . Alcohol Use: No  . Drug Use:   . Sexual Activity:    Other Topics Concern  . Not on file   Social History Narrative  . No narrative on file    Current Outpatient Prescriptions on File Prior to Visit  Medication Sig Dispense Refill  . aspirin 81 MG tablet Take 81 mg by mouth daily.        . bromocriptine (PARLODEL) 2.5 MG tablet Take 1 tablet (2.5 mg total) by mouth 2 (two) times daily.  180 tablet  1  . Canagliflozin (INVOKANA) 100 MG TABS Take 1 tablet (100 mg total) by mouth daily.  30 tablet  11  . clotrimazole-betamethasone (LOTRISONE) cream Apply 3x a day as needed for rash  30 g  0  . fish oil-omega-3 fatty acids 1000 MG capsule Take 3 capsules by mouth daily.        Marland Kitchen gabapentin (NEURONTIN)  300 MG capsule Take 2 capsules by mouth three times daily  540 capsule  PRN  . glucose blood (ONE TOUCH ULTRA TEST) test strip Use as instructed check blood sugar two times daily       . lisinopril-hydrochlorothiazide (PRINZIDE,ZESTORETIC) 10-12.5 MG per tablet Take 1 tablet by mouth daily.  90 tablet  1  . metFORMIN (GLUCOPHAGE-XR) 500 MG 24 hr tablet Take 1,000 mg by mouth daily with breakfast.      . nateglinide (STARLIX) 120 MG tablet Take 1 tablet (120 mg total) by mouth 3 (three) times daily before meals.  90 tablet  11  . omeprazole (PRILOSEC) 20 MG capsule Take 1 capsule (20 mg total) by mouth daily.  90 capsule  3  . potassium chloride SA (K-DUR,KLOR-CON) 20 MEQ tablet Take 1 tablet (20 mEq total) by mouth daily.  90 tablet  3  . sildenafil (VIAGRA) 100 MG tablet Take 1 tablet (100 mg total) by mouth as needed.  30 tablet  1  . simvastatin (ZOCOR) 40 MG tablet Take 1 tablet (40 mg total) by mouth at bedtime.  90 tablet  3  . sitaGLIPtin (JANUVIA) 100 MG tablet Take 1 tablet (100  mg total) by mouth daily.  90 tablet  3  . testosterone cypionate (DEPOTESTOTERONE CYPIONATE) 200 MG/ML injection Inject 1 mL (200 mg total) into the muscle every 14 (fourteen) days. And syringes/needles # 10 each  10 mL  0  . traZODone (DESYREL) 50 MG tablet Take 1 tablet (50 mg total) by mouth at bedtime.  90 tablet  3   No current facility-administered medications on file prior to visit.   Allergies  Allergen Reactions  . Pioglitazone     REACTION: edema   Family History  Problem Relation Age of Onset  . Cancer Mother     Colon Cancer    BP 122/78  Pulse 86  Temp(Src) 97.8 F (36.6 C) (Oral)  Ht 5\' 11"  (1.803 m)  Wt 292 lb 4 oz (132.564 kg)  BMI 40.78 kg/m2  SpO2 97%  Review of Systems He has weight gain, but no n/v.      Objective:   Physical Exam VITAL SIGNS:  See vs page GENERAL: no distress ABDOMEN: abdomen is soft, nontender.  no hepatosplenomegaly. not distended.  no hernia.          Assessment & Plan:  Constipation, persistent

## 2013-03-15 ENCOUNTER — Ambulatory Visit: Payer: BC Managed Care – PPO | Admitting: Endocrinology

## 2013-05-16 ENCOUNTER — Other Ambulatory Visit: Payer: Self-pay

## 2013-05-16 MED ORDER — POTASSIUM CHLORIDE CRYS ER 20 MEQ PO TBCR: 20.0000 meq | EXTENDED_RELEASE_TABLET | Freq: Every day | ORAL | Status: DC

## 2013-05-16 MED ORDER — CANAGLIFLOZIN 100 MG PO TABS: 1.0000 | ORAL_TABLET | Freq: Every day | ORAL | Status: DC

## 2013-05-16 MED ORDER — SIMVASTATIN 40 MG PO TABS: 40.0000 mg | ORAL_TABLET | Freq: Every day | ORAL | Status: DC

## 2013-05-16 MED ORDER — BROMOCRIPTINE MESYLATE 2.5 MG PO TABS: 2.5000 mg | ORAL_TABLET | Freq: Two times a day (BID) | ORAL | Status: DC

## 2013-05-16 MED ORDER — LISINOPRIL-HYDROCHLOROTHIAZIDE 10-12.5 MG PO TABS: 1.0000 | ORAL_TABLET | Freq: Every day | ORAL | Status: DC

## 2013-05-16 MED ORDER — OMEPRAZOLE 20 MG PO CPDR: 20.0000 mg | DELAYED_RELEASE_CAPSULE | Freq: Every day | ORAL | Status: DC

## 2013-05-16 MED ORDER — SITAGLIPTIN PHOSPHATE 100 MG PO TABS: 100.0000 mg | ORAL_TABLET | Freq: Every day | ORAL | Status: DC

## 2013-05-16 MED ORDER — TRAZODONE HCL 50 MG PO TABS: 50.0000 mg | ORAL_TABLET | Freq: Every day | ORAL | Status: DC

## 2013-05-16 MED ORDER — NATEGLINIDE 120 MG PO TABS: 120.0000 mg | ORAL_TABLET | Freq: Three times a day (TID) | ORAL | Status: DC

## 2013-05-16 MED ORDER — METFORMIN HCL ER 500 MG PO TB24: 1000.0000 mg | ORAL_TABLET | Freq: Every day | ORAL | Status: DC

## 2013-10-17 ENCOUNTER — Telehealth: Payer: Self-pay | Admitting: Endocrinology

## 2013-10-17 MED ORDER — SILDENAFIL CITRATE 100 MG PO TABS: 100.0000 mg | ORAL_TABLET | ORAL | Status: DC | PRN

## 2013-10-17 NOTE — Telephone Encounter (Signed)
Patient would like to have his Viagra 100mg  called in   Pharmacy: [facility]

## 2013-10-17 NOTE — Telephone Encounter (Signed)
Rx sent to pharmacy. Pt informed via voicemail.

## 2014-01-23 ENCOUNTER — Other Ambulatory Visit: Payer: Self-pay

## 2014-01-23 MED ORDER — METFORMIN HCL ER 500 MG PO TB24: 1000.0000 mg | ORAL_TABLET | Freq: Every day | ORAL | Status: DC

## 2014-01-23 MED ORDER — LISINOPRIL-HYDROCHLOROTHIAZIDE 10-12.5 MG PO TABS: 1.0000 | ORAL_TABLET | Freq: Every day | ORAL | Status: DC

## 2014-01-28 ENCOUNTER — Encounter: Payer: Self-pay | Admitting: Endocrinology

## 2014-01-28 ENCOUNTER — Ambulatory Visit (INDEPENDENT_AMBULATORY_CARE_PROVIDER_SITE_OTHER): Payer: No Typology Code available for payment source | Admitting: Endocrinology

## 2014-01-28 VITALS — BP 134/82 | HR 94 | Temp 98.8°F | Ht 71.0 in | Wt 295.0 lb

## 2014-01-28 DIAGNOSIS — Z0000 Encounter for general adult medical examination without abnormal findings: Secondary | ICD-10-CM

## 2014-01-28 DIAGNOSIS — N189 Chronic kidney disease, unspecified: Secondary | ICD-10-CM

## 2014-01-28 DIAGNOSIS — E1122 Type 2 diabetes mellitus with diabetic chronic kidney disease: Secondary | ICD-10-CM

## 2014-01-28 DIAGNOSIS — E7800 Pure hypercholesterolemia, unspecified: Secondary | ICD-10-CM

## 2014-01-28 DIAGNOSIS — M609 Myositis, unspecified: Secondary | ICD-10-CM

## 2014-01-28 DIAGNOSIS — M791 Myalgia: Secondary | ICD-10-CM

## 2014-01-28 DIAGNOSIS — Z23 Encounter for immunization: Secondary | ICD-10-CM

## 2014-01-28 DIAGNOSIS — Z125 Encounter for screening for malignant neoplasm of prostate: Secondary | ICD-10-CM

## 2014-01-28 DIAGNOSIS — IMO0001 Reserved for inherently not codable concepts without codable children: Secondary | ICD-10-CM

## 2014-01-28 DIAGNOSIS — I10 Essential (primary) hypertension: Secondary | ICD-10-CM

## 2014-01-28 DIAGNOSIS — N259 Disorder resulting from impaired renal tubular function, unspecified: Secondary | ICD-10-CM

## 2014-01-28 LAB — SEDIMENTATION RATE: Sed Rate: 9 mm/hr (ref 0–22)

## 2014-01-28 LAB — BASIC METABOLIC PANEL
BUN: 16 mg/dL (ref 6–23)
CALCIUM: 9.5 mg/dL (ref 8.4–10.5)
CO2: 25 meq/L (ref 19–32)
Chloride: 102 mEq/L (ref 96–112)
Creatinine, Ser: 1.6 mg/dL — ABNORMAL HIGH (ref 0.4–1.5)
GFR: 57.81 mL/min — ABNORMAL LOW (ref >60.00)
Glucose, Bld: 188 mg/dL — ABNORMAL HIGH (ref 70–99)
Potassium: 4 mEq/L (ref 3.5–5.1)
Sodium: 136 mEq/L (ref 135–145)

## 2014-01-28 LAB — URINALYSIS, ROUTINE W REFLEX MICROSCOPIC
Bilirubin Urine: NEGATIVE
HGB URINE DIPSTICK: NEGATIVE
Ketones, ur: NEGATIVE
LEUKOCYTES UA: NEGATIVE
NITRITE: NEGATIVE
RBC / HPF: NONE SEEN (ref 0–?)
Specific Gravity, Urine: 1.025 (ref 1.000–1.030)
Total Protein, Urine: NEGATIVE
Urine Glucose: NEGATIVE
Urobilinogen, UA: 0.2 (ref 0.0–1.0)
WBC, UA: NONE SEEN (ref 0–?)
pH: 6 (ref 5.0–8.0)

## 2014-01-28 LAB — CBC WITH DIFFERENTIAL/PLATELET
BASOS ABS: 0 10*3/uL (ref 0.0–0.1)
Basophils Relative: 0.4 % (ref 0.0–3.0)
EOS ABS: 0.2 10*3/uL (ref 0.0–0.7)
Eosinophils Relative: 3.9 % (ref 0.0–5.0)
HCT: 44.4 % (ref 39.0–52.0)
Hemoglobin: 14.3 g/dL (ref 13.0–17.0)
LYMPHS PCT: 24.6 % (ref 12.0–46.0)
Lymphs Abs: 1.2 10*3/uL (ref 0.7–4.0)
MCHC: 32.2 g/dL (ref 30.0–36.0)
MCV: 87.5 fl (ref 78.0–100.0)
MONO ABS: 0.3 10*3/uL (ref 0.1–1.0)
Monocytes Relative: 7.2 % (ref 3.0–12.0)
NEUTROS PCT: 63.9 % (ref 43.0–77.0)
Neutro Abs: 3 10*3/uL (ref 1.4–7.7)
PLATELETS: 166 10*3/uL (ref 150.0–400.0)
RBC: 5.08 Mil/uL (ref 4.22–5.81)
RDW: 13.2 % (ref 11.5–15.5)
WBC: 4.7 10*3/uL (ref 4.0–10.5)

## 2014-01-28 LAB — CK: CK TOTAL: 143 U/L (ref 7–232)

## 2014-01-28 LAB — MICROALBUMIN / CREATININE URINE RATIO
Creatinine,U: 132 mg/dL
Microalb Creat Ratio: 2.8 mg/g (ref 0.0–30.0)
Microalb, Ur: 3.7 mg/dL — ABNORMAL HIGH (ref 0.0–1.9)

## 2014-01-28 LAB — PSA: PSA: 0.71 ng/mL (ref 0.10–4.00)

## 2014-01-28 LAB — HEPATIC FUNCTION PANEL
ALBUMIN: 3.7 g/dL (ref 3.5–5.2)
ALT: 19 U/L (ref 0–53)
AST: 21 U/L (ref 0–37)
Alkaline Phosphatase: 58 U/L (ref 39–117)
Bilirubin, Direct: 0.1 mg/dL (ref 0.0–0.3)
TOTAL PROTEIN: 7.6 g/dL (ref 6.0–8.3)
Total Bilirubin: 0.7 mg/dL (ref 0.2–1.2)

## 2014-01-28 LAB — LIPID PANEL
Cholesterol: 131 mg/dL (ref 0–200)
HDL: 27.9 mg/dL — AB (ref >39.00)
LDL Cholesterol: 73 mg/dL (ref 0–99)
NonHDL: 103.1
Total CHOL/HDL Ratio: 5
Triglycerides: 150 mg/dL — ABNORMAL HIGH (ref 0.0–149.0)
VLDL: 30 mg/dL (ref 0.0–40.0)

## 2014-01-28 LAB — HEMOGLOBIN A1C: Hgb A1c MFr Bld: 7.7 % — ABNORMAL HIGH (ref 4.6–6.5)

## 2014-01-28 LAB — TSH: TSH: 0.8 u[IU]/mL (ref 0.35–4.50)

## 2014-01-28 MED ORDER — REPAGLINIDE 2 MG PO TABS: 2.0000 mg | ORAL_TABLET | Freq: Three times a day (TID) | ORAL | Status: DC

## 2014-01-28 NOTE — Progress Notes (Signed)
Subjective:    Patient ID: David Lindsey, male    DOB: 02-13-1956, 58 y.o.   MRN: [id number]  HPI Pt is here for regular wellness examination, and is feeling pretty well in general, and says chronic med probs are stable, except as noted below Past Medical History  Diagnosis Date  . DIABETES MELLITUS, TYPE II 11/10/2006  . 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 HYPERCHOLESTEROLEMIA 12/22/2007  . HYPOKALEMIA 06/06/2008  . ANEMIA, IRON DEFICIENCY 12/26/2007  . Pancytopenia 06/06/2008  . ALCOHOLISM 12/22/2007  . HYPERTENSION 11/10/2006  . ESOPHAGITIS, REFLUX 12/22/2007  . RENAL INSUFFICIENCY 11/10/2006  . Leukopenia   . Neuropathy     Chronic Neuropathy leg pain    Past Surgical History  Procedure Laterality Date  . Appendectomy    . Esophagogastroduodenoscopy  08/20/2004  . Electrocardiogram  11/07/2006    History   Social History  . Marital Status: Married    Spouse Name: N/A    Number of Children: N/A  . 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 Smoking status: Never Smoker   . Smokeless tobacco: Not on file  . Alcohol Use: No  . Drug Use:   . Sexual Activity:    Other Topics Concern  . Not on file   Social History Narrative  . No narrative on file    Current Outpatient Prescriptions on File Prior to Visit  Medication Sig Dispense Refill  . aspirin 81 MG tablet Take 81 mg by mouth daily.        . bromocriptine (PARLODEL) 2.5 MG tablet Take 1 tablet (2.5 mg total) by mouth 2 (two) times daily.  180 tablet  2  . Canagliflozin (INVOKANA) 100 MG TABS Take 1 tablet (100 mg total) by mouth daily.  30 tablet  11  . clotrimazole-betamethasone (LOTRISONE) cream Apply 3x a day as needed for rash  30 g  0  . fish oil-omega-3 fatty acids 1000 MG capsule Take 3 capsules by mouth daily.        Marland Kitchen gabapentin (NEURONTIN) 300 MG capsule Take 2 capsules by mouth three times daily  540 capsule  PRN  . glucose blood (ONE  TOUCH ULTRA TEST) test strip Use as instructed check blood sugar two times daily       . Lactulose 20 GM/30ML SOLN Take 60 mLs (40 g total) by mouth 2 (two) times daily.  1892 mL  11  . lisinopril-hydrochlorothiazide (PRINZIDE,ZESTORETIC) 10-12.5 MG per tablet Take 1 tablet by mouth daily.  90 tablet  0  . metFORMIN (GLUCOPHAGE-XR) 500 MG 24 hr tablet Take 2 tablets (1,000 mg total) by mouth daily with breakfast.  180 tablet  0  . omeprazole (PRILOSEC) 20 MG capsule Take 1 capsule (20 mg total) by mouth daily.  90 capsule  3  . potassium chloride SA (K-DUR,KLOR-CON) 20 MEQ tablet Take 1 tablet (20 mEq total) by mouth daily.  90 tablet  3  . sildenafil (VIAGRA) 100 MG tablet Take 1 tablet (100 mg total) by mouth as needed.  30 tablet  1  . simvastatin (ZOCOR) 40 MG tablet Take 1 tablet (40 mg total) by mouth at bedtime.  90 tablet  3  . sitaGLIPtin (JANUVIA) 100 MG tablet Take 1 tablet (100 mg total) by mouth daily.  90 tablet  3  . testosterone cypionate (DEPOTESTOTERONE CYPIONATE) 200 MG/ML injection Inject 1 mL (200 mg total) into  the muscle every 14 (fourteen) days. And syringes/needles # 10 each  10 mL  0  . traZODone (DESYREL) 50 MG tablet Take 1 tablet (50 mg total) by mouth at bedtime.  90 tablet  3   No current facility-administered medications on file prior to visit.    Allergies  Allergen Reactions  . Pioglitazone     REACTION: edema    Family History  Problem Relation Age of Onset  . Cancer Mother     Colon Cancer    BP 134/82  Pulse 94  Temp(Src) 98.8 F (37.1 C) (Oral)  Ht 5\' 11"  (1.803 m)  Wt 295 lb (133.811 kg)  BMI 41.16 kg/m2  SpO2 96%  Review of Systems  Constitutional: Negative for fever.  HENT: Negative for hearing loss.   Eyes: Negative for visual disturbance.  Respiratory: Negative for shortness of breath.   Cardiovascular: Negative for chest pain.  Gastrointestinal: Negative for anal bleeding.  Endocrine: Negative for cold intolerance.    Genitourinary: Negative for hematuria.  Musculoskeletal: Negative for gait problem.  Skin: Negative for rash.  Allergic/Immunologic: Negative for environmental allergies.  Neurological: Negative for syncope.  Hematological: Does not bruise/bleed easily.  Psychiatric/Behavioral: Negative for dysphoric mood.       Objective:   Physical Exam VS: see vs page GEN: no distress HEAD: head: no deformity eyes: no periorbital swelling, no proptosis external nose and ears are normal mouth: no lesion seen NECK: supple, thyroid is not enlarged CHEST WALL: no deformity LUNGS: clear to auscultation BREASTS:  pseudogynecomastia CV: reg rate and rhythm, no murmur ABD: abdomen is soft, nontender.  no hepatosplenomegaly.  not distended.  no hernia RECTAL: normal external and internal exam.  heme neg. PROSTATE:  Normal size.  No nodule MUSCULOSKELETAL: muscle bulk and strength are grossly normal.  no obvious joint swelling.  gait is normal and steady EXTEMITIES: no deformity.  no ulcer on the feet.  feet are of normal color and temp.  1+ bilat leg edema.  There is bilateral onychomycosis.   PULSES: dorsalis pedis intact bilat.  no carotid bruit NEURO:  cn 2-12 grossly intact.   readily moves all 4's.  sensation is intact to touch on the feet SKIN:  Normal texture and temperature.  No rash or suspicious lesion is visible.   NODES:  None palpable at the neck PSYCH: alert, well-oriented.  Does not appear anxious nor depressed.       Assessment & Plan:  Wellness visit today, with problems stable, except as noted. please consider these measures for your health:  minimize alcohol.  do not use tobacco products.  have a colonoscopy at least every 10 years from age 56.  keep firearms safely stored.  always use seat belts.  have working smoke alarms in your home.  see an eye doctor and dentist regularly.  never drive under the influence of alcohol or drugs (including prescription drugs).    SEPARATE  EVALUATION FOLLOWS--EACH PROBLEM HERE IS NEW, NOT RESPONDING TO TREATMENT, OR POSES SIGNIFICANT RISK TO THE PATIENT'S HEALTH: HISTORY OF THE PRESENT ILLNESS: Pt returns for f/u of DM.  pt states he feels well in general. He takes meds as rx'ed.  PAST MEDICAL HISTORY reviewed and up to date today REVIEW OF SYSTEMS: Denies weight change and numbness.  PHYSICAL EXAMINATION: VITAL SIGNS:  See vs page GENERAL: no distress Pulses: dorsalis pedis intact bilat.   Feet: no deformity.  1+ bilat leg edema.  There is bilateral onychomycosis Skin:  no ulcer on  the feet.  normal color and temp. Neuro: sensation is intact to touch on the feet. LAB/XRAY RESULTS: i reviewed electrocardiogram Lab Results  Component Value Date   HGBA1C 7.7* 01/28/2014   Lab Results  Component Value Date   CREATININE 1.6* 01/28/2014   BUN 16 01/28/2014   NA 136 01/28/2014   K 4.0 01/28/2014   CL 102 01/28/2014   CO2 25 01/28/2014  IMPRESSION: Renal insuff, new. DM: mild exacerbation PLAN: See instruction page

## 2014-01-28 NOTE — Patient Instructions (Addendum)
Please see a weight-loss surgery specialist.  you will receive a phone call, about a day and time for an appointment. blood tests are being requested for you today.  We'll contact you with results.  please consider these measures for your health:  minimize alcohol.  do not use tobacco products.  have a colonoscopy at least every 10 years from age 58.  keep firearms safely stored.  always use seat belts.  have working smoke alarms in your home.  see an eye doctor and dentist regularly.  never drive under the influence of alcohol or drugs (including prescription drugs).   Please come back for a follow-up appointment in 6 months.

## 2014-01-30 ENCOUNTER — Telehealth: Payer: Self-pay | Admitting: Endocrinology

## 2014-01-30 NOTE — Telephone Encounter (Signed)
Requested call back to discuss.  

## 2014-01-30 NOTE — Telephone Encounter (Signed)
Wants the results of his labs

## 2014-01-31 NOTE — Telephone Encounter (Signed)
Requested call back to discuss.  

## 2014-02-01 ENCOUNTER — Other Ambulatory Visit: Payer: Self-pay

## 2014-02-01 ENCOUNTER — Other Ambulatory Visit: Payer: Self-pay | Admitting: Endocrinology

## 2014-02-01 MED ORDER — TESTOSTERONE CYPIONATE 200 MG/ML IM SOLN: 200.0000 mg | INTRAMUSCULAR | Status: DC

## 2014-02-04 DIAGNOSIS — Z0279 Encounter for issue of other medical certificate: Secondary | ICD-10-CM

## 2014-02-11 ENCOUNTER — Telehealth: Payer: Self-pay | Admitting: Endocrinology

## 2014-02-11 DIAGNOSIS — E291 Testicular hypofunction: Secondary | ICD-10-CM

## 2014-02-11 NOTE — Telephone Encounter (Signed)
Lvom advising pt to call back and schedule lab appointment.

## 2014-02-11 NOTE — Telephone Encounter (Signed)
please call patient: Please come in for testosterone blood test.  i need it in order to do the prior authorization.

## 2014-02-22 ENCOUNTER — Other Ambulatory Visit (INDEPENDENT_AMBULATORY_CARE_PROVIDER_SITE_OTHER): Payer: No Typology Code available for payment source

## 2014-02-22 ENCOUNTER — Other Ambulatory Visit: Payer: Self-pay | Admitting: Endocrinology

## 2014-02-22 DIAGNOSIS — E291 Testicular hypofunction: Secondary | ICD-10-CM

## 2014-02-22 LAB — TESTOSTERONE: TESTOSTERONE: 114.08 ng/dL — AB (ref 300.00–890.00)

## 2014-02-26 DIAGNOSIS — Z0279 Encounter for issue of other medical certificate: Secondary | ICD-10-CM

## 2014-03-11 ENCOUNTER — Telehealth: Payer: Self-pay | Admitting: Endocrinology

## 2014-03-11 NOTE — Telephone Encounter (Signed)
Contacted pt and advised the PA has been faxed to [employer]. Pt states that he will contact his insurance company and see what status is.

## 2014-03-11 NOTE — Telephone Encounter (Signed)
Pt would like the testosterone results please

## 2014-04-17 ENCOUNTER — Other Ambulatory Visit: Payer: Self-pay

## 2014-04-17 MED ORDER — METFORMIN HCL ER 500 MG PO TB24: 1000.0000 mg | ORAL_TABLET | Freq: Every day | ORAL | Status: DC

## 2014-04-17 MED ORDER — OMEPRAZOLE 20 MG PO CPDR
20.0000 mg | DELAYED_RELEASE_CAPSULE | Freq: Every day | ORAL | Status: DC
Start: 2014-04-17 — End: 2014-11-19

## 2014-04-17 MED ORDER — POTASSIUM CHLORIDE CRYS ER 20 MEQ PO TBCR: 20.0000 meq | EXTENDED_RELEASE_TABLET | Freq: Every day | ORAL | Status: DC

## 2014-04-17 MED ORDER — LISINOPRIL-HYDROCHLOROTHIAZIDE 10-12.5 MG PO TABS: 1.0000 | ORAL_TABLET | Freq: Every day | ORAL | Status: DC

## 2014-04-17 MED ORDER — SIMVASTATIN 40 MG PO TABS: 40.0000 mg | ORAL_TABLET | Freq: Every day | ORAL | Status: DC

## 2014-04-19 ENCOUNTER — Other Ambulatory Visit: Payer: Self-pay

## 2014-04-19 MED ORDER — SITAGLIPTIN PHOSPHATE 100 MG PO TABS: 100.0000 mg | ORAL_TABLET | Freq: Every day | ORAL | Status: DC

## 2014-04-23 ENCOUNTER — Ambulatory Visit: Payer: No Typology Code available for payment source | Admitting: Endocrinology

## 2014-04-30 ENCOUNTER — Telehealth: Payer: Self-pay | Admitting: Endocrinology

## 2014-04-30 NOTE — Telephone Encounter (Signed)
Patient would like to know do he need to have labs drawn before he make a follow up appointment, please advise

## 2014-04-30 NOTE — Telephone Encounter (Signed)
Pt advised that he could come a few days before his appointment to have his lab work check. Requested call back from pt schedule lab appointment.

## 2014-07-02 ENCOUNTER — Telehealth: Payer: Self-pay

## 2014-07-02 NOTE — Telephone Encounter (Signed)
Please refill prn 

## 2014-07-02 NOTE — Telephone Encounter (Signed)
Received a refill request for Trazadone 50 mg. Pt was last seen on 01/28/2014. Thanks!

## 2014-07-03 MED ORDER — TRAZODONE HCL 50 MG PO TABS: 50.0000 mg | ORAL_TABLET | Freq: Every day | ORAL | Status: DC

## 2014-07-03 NOTE — Telephone Encounter (Signed)
Rx sent to pharmacy   

## 2014-07-26 ENCOUNTER — Other Ambulatory Visit: Payer: Self-pay | Admitting: Endocrinology

## 2014-07-26 ENCOUNTER — Other Ambulatory Visit (INDEPENDENT_AMBULATORY_CARE_PROVIDER_SITE_OTHER): Payer: No Typology Code available for payment source

## 2014-07-26 DIAGNOSIS — E1122 Type 2 diabetes mellitus with diabetic chronic kidney disease: Secondary | ICD-10-CM | POA: Diagnosis not present

## 2014-07-26 DIAGNOSIS — E291 Testicular hypofunction: Secondary | ICD-10-CM

## 2014-07-26 DIAGNOSIS — N259 Disorder resulting from impaired renal tubular function, unspecified: Secondary | ICD-10-CM | POA: Diagnosis not present

## 2014-07-26 DIAGNOSIS — N189 Chronic kidney disease, unspecified: Secondary | ICD-10-CM | POA: Diagnosis not present

## 2014-07-26 LAB — HEMOGLOBIN A1C: HEMOGLOBIN A1C: 8.2 % — AB (ref 4.6–6.5)

## 2014-07-26 LAB — BASIC METABOLIC PANEL
BUN: 17 mg/dL (ref 6–23)
CO2: 24 mEq/L (ref 19–32)
Calcium: 9.4 mg/dL (ref 8.4–10.5)
Chloride: 100 mEq/L (ref 96–112)
Creatinine, Ser: 1.56 mg/dL — ABNORMAL HIGH (ref 0.40–1.50)
GFR: 58.57 mL/min — AB (ref >60.00)
Glucose, Bld: 188 mg/dL — ABNORMAL HIGH (ref 70–99)
POTASSIUM: 4 meq/L (ref 3.5–5.1)
Sodium: 132 mEq/L — ABNORMAL LOW (ref 135–145)

## 2014-07-26 LAB — TESTOSTERONE: TESTOSTERONE: 105.63 ng/dL — AB (ref 300.00–890.00)

## 2014-07-30 ENCOUNTER — Ambulatory Visit: Payer: No Typology Code available for payment source | Admitting: Endocrinology

## 2014-08-09 ENCOUNTER — Encounter: Payer: Self-pay | Admitting: Endocrinology

## 2014-08-09 ENCOUNTER — Ambulatory Visit (INDEPENDENT_AMBULATORY_CARE_PROVIDER_SITE_OTHER): Payer: No Typology Code available for payment source | Admitting: Endocrinology

## 2014-08-09 VITALS — BP 132/84 | HR 96 | Temp 98.9°F | Ht 71.0 in | Wt 295.0 lb

## 2014-08-09 DIAGNOSIS — E291 Testicular hypofunction: Secondary | ICD-10-CM

## 2014-08-09 DIAGNOSIS — D61818 Other pancytopenia: Secondary | ICD-10-CM

## 2014-08-09 DIAGNOSIS — E1122 Type 2 diabetes mellitus with diabetic chronic kidney disease: Secondary | ICD-10-CM | POA: Diagnosis not present

## 2014-08-09 DIAGNOSIS — N189 Chronic kidney disease, unspecified: Secondary | ICD-10-CM

## 2014-08-09 MED ORDER — LIRAGLUTIDE 18 MG/3ML ~~LOC~~ SOPN: 1.8000 mg | PEN_INJECTOR | Freq: Every day | SUBCUTANEOUS | Status: DC

## 2014-08-09 NOTE — Progress Notes (Signed)
Subjective:    Patient ID: David Lindsey, male    DOB: [birth year], 59 y.o.   MRN: [id number]  HPI The state of at least three ongoing medical problems is addressed today, with interval history of each noted here: Pt returns for f/u of diabetes mellitus: DM type: 2 Dx'ed: 1696 Complications: renal insufficiency Therapy: 4 oral meds DKA: never Severe hypoglycemia: never Pancreatitis: never Other: he has never been on insulin Interval history: no cbg record, but states cbg's are well-controlled.  He denies hypoglycemia.  He stopped invokana and parlodel (says they caused tremor).   Denies decreased urinary stream.  Last injection was 3 days ago. Renal insufficiency: he denies dysuria Past Medical History  Diagnosis Date  . DIABETES MELLITUS, TYPE II 11/10/2006  . 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 HYPERCHOLESTEROLEMIA 12/22/2007  . HYPOKALEMIA 06/06/2008  . ANEMIA, IRON DEFICIENCY 12/26/2007  . Pancytopenia 06/06/2008  . ALCOHOLISM 12/22/2007  . HYPERTENSION 11/10/2006  . ESOPHAGITIS, REFLUX 12/22/2007  . RENAL INSUFFICIENCY 11/10/2006  . Leukopenia   . Neuropathy     Chronic Neuropathy leg pain    Past Surgical History  Procedure Laterality Date  . Appendectomy    . Esophagogastroduodenoscopy  08/20/2004  . Electrocardiogram  11/07/2006    History   Social History  . Marital Status: Married    Spouse Name: N/A  . Number of Children: N/A  . 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 Smoking status: Never Smoker   . Smokeless tobacco: Not on file  . Alcohol Use: No  . Drug Use: Not on file  . Sexual Activity: Not on file   Other Topics Concern  . Not on file   Social History Narrative    Current Outpatient Prescriptions on File Prior to Visit  Medication Sig Dispense Refill  . aspirin 81 MG tablet Take 81 mg by mouth daily.      . clotrimazole-betamethasone (LOTRISONE) cream Apply 3x a day as  needed for rash 30 g 0  . fish oil-omega-3 fatty acids 1000 MG capsule Take 3 capsules by mouth daily.      Marland Kitchen gabapentin (NEURONTIN) 300 MG capsule Take 2 capsules by mouth three times daily 540 capsule PRN  . glucose blood (ONE TOUCH ULTRA TEST) test strip Use as instructed check blood sugar two times daily     . Lactulose 20 GM/30ML SOLN Take 60 mLs (40 g total) by mouth 2 (two) times daily. 1892 mL 11  . lisinopril-hydrochlorothiazide (PRINZIDE,ZESTORETIC) 10-12.5 MG per tablet Take 1 tablet by mouth daily. 90 tablet 3  . metFORMIN (GLUCOPHAGE-XR) 500 MG 24 hr tablet Take 2 tablets (1,000 mg total) by mouth daily with breakfast. 180 tablet 1  . omeprazole (PRILOSEC) 20 MG capsule Take 1 capsule (20 mg total) by mouth daily. 90 capsule 3  . potassium chloride SA (K-DUR,KLOR-CON) 20 MEQ tablet Take 1 tablet (20 mEq total) by mouth daily. 90 tablet 3  . repaglinide (PRANDIN) 2 MG tablet Take 1 tablet (2 mg total) by mouth 3 (three) times daily before meals. 270 tablet 3  . sildenafil (VIAGRA) 100 MG tablet Take 1 tablet (100 mg total) by mouth as needed. 30 tablet 1  . simvastatin (ZOCOR) 40 MG tablet Take 1 tablet (40 mg total) by mouth at bedtime. 90 tablet 3  . sitaGLIPtin (JANUVIA) 100 MG tablet Take 1 tablet (100 mg total) by mouth daily.  90 tablet 3  . testosterone cypionate (DEPOTESTOTERONE CYPIONATE) 200 MG/ML injection Inject 1 mL (200 mg total) into the muscle every 14 (fourteen) days. And syringes/needles # 10 each 10 mL 0  . traZODone (DESYREL) 50 MG tablet Take 1 tablet (50 mg total) by mouth at bedtime. 90 tablet 3   No current facility-administered medications on file prior to visit.    Allergies  Allergen Reactions  . Pioglitazone     REACTION: edema    Family History  Problem Relation Age of Onset  . Cancer Mother     Colon Cancer    BP 132/84 mmHg  Pulse 96  Temp(Src) 98.9 F (37.2 C) (Oral)  Ht 5\' 11"  (1.803 m)  Wt 295 lb (133.811 kg)  BMI 41.16 kg/m2  SpO2  98%   Review of Systems Denies hematuria and weight change.     Objective:   Physical Exam VITAL SIGNS:  See vs page GENERAL: no distress Pulses: dorsalis pedis intact bilat.   MSK: no deformity of the feet CV: 1+ bilat leg edema Skin:  no ulcer on the feet.  normal color and temp on the feet. Neuro: sensation is intact to touch on the feet   Lab Results  Component Value Date   HGBA1C 8.2* 07/26/2014   Lab Results  Component Value Date   CREATININE 1.56* 07/26/2014   BUN 17 07/26/2014   NA 132* 07/26/2014   K 4.0 07/26/2014   CL 100 07/26/2014   CO2 24 07/26/2014   Lab Results  Component Value Date   TESTOSTERONE 105.63* 07/26/2014       Assessment & Plan:  Renal insufficiency: stable Hypogoadism: as he is on maximum safe dosage, all we can do is to increase frequency, but overall dosage. DM: he needs increased rx Obesity: persistent  Patient is advised the following: Patient Instructions  You should take "victoza" pen, once a day.  The side-effect is nausea, which goes away with time.  To avoid this side-effect, start with the lowest (0.6) setting.  After a few days, increase to 1.2.  If you still have little or no nausea, increase to the highest (1.8) setting, and continue that setting.  Here is a discount card.  This medication replaces the "[place]."   Please come back for a follow-up appointment in 3 months Please consider having weight loss surgery.  It is good for your health.  Here is some information about it.  If you decide to consider further, please call the phone number in the papers, and register for a free informational meeting.   Try changing the testosterone to 100 mg every week, to level it out.

## 2014-08-09 NOTE — Patient Instructions (Addendum)
You should take "victoza" pen, once a day.  The side-effect is nausea, which goes away with time.  To avoid this side-effect, start with the lowest (0.6) setting.  After a few days, increase to 1.2.  If you still have little or no nausea, increase to the highest (1.8) setting, and continue that setting.  Here is a discount card.  This medication replaces the "[place]."   Please come back for a follow-up appointment in 3 months Please consider having weight loss surgery.  It is good for your health.  Here is some information about it.  If you decide to consider further, please call the phone number in the papers, and register for a free informational meeting.   Try changing the testosterone to 100 mg every week, to level it out.

## 2014-08-29 ENCOUNTER — Other Ambulatory Visit: Payer: Self-pay | Admitting: Endocrinology

## 2014-08-29 MED ORDER — TESTOSTERONE CYPIONATE 200 MG/ML IM SOLN: 100.0000 mg | INTRAMUSCULAR | Status: DC

## 2014-08-29 NOTE — Telephone Encounter (Signed)
i printed 

## 2014-08-29 NOTE — Telephone Encounter (Signed)
Rx faxed to patient's pharmacy.

## 2014-08-31 LAB — HM DIABETES EYE EXAM

## 2014-09-02 ENCOUNTER — Encounter: Payer: Self-pay | Admitting: Endocrinology

## 2014-09-18 ENCOUNTER — Other Ambulatory Visit: Payer: Self-pay

## 2014-09-18 ENCOUNTER — Telehealth: Payer: Self-pay | Admitting: Endocrinology

## 2014-09-18 MED ORDER — GABAPENTIN 300 MG PO CAPS: ORAL_CAPSULE | ORAL | Status: DC

## 2014-09-18 NOTE — Telephone Encounter (Signed)
Rx sent per pt's request.  

## 2014-09-18 NOTE — Telephone Encounter (Signed)
Patient called and would like a one month supply of his medication as he is waiting on mail order   Rx: Gabapentin   Pharmacy: [facility] on [address]    Thank you

## 2014-11-19 ENCOUNTER — Telehealth: Payer: Self-pay | Admitting: Endocrinology

## 2014-11-19 MED ORDER — OMEPRAZOLE 20 MG PO CPDR: 20.0000 mg | DELAYED_RELEASE_CAPSULE | Freq: Every day | ORAL | Status: DC

## 2014-11-19 NOTE — Telephone Encounter (Signed)
Rx sent to [facility].

## 2014-11-19 NOTE — Telephone Encounter (Signed)
Patient called stating that he normally uses a mail delivery for his prescription  Please send to a local pharmacy   Rx: Omeprazole   Pharmacy: [facility] on [address]   Please advise

## 2014-12-05 ENCOUNTER — Other Ambulatory Visit: Payer: Self-pay | Admitting: Endocrinology

## 2014-12-10 ENCOUNTER — Telehealth: Payer: Self-pay

## 2014-12-10 NOTE — Telephone Encounter (Signed)
Left pt a VM to return call for pt to come in for A1c recheck

## 2014-12-13 ENCOUNTER — Ambulatory Visit (INDEPENDENT_AMBULATORY_CARE_PROVIDER_SITE_OTHER): Payer: No Typology Code available for payment source | Admitting: Endocrinology

## 2014-12-13 ENCOUNTER — Encounter: Payer: Self-pay | Admitting: Endocrinology

## 2014-12-13 VITALS — BP 128/87 | HR 87 | Temp 97.9°F | Ht 71.0 in | Wt 290.0 lb

## 2014-12-13 DIAGNOSIS — Z23 Encounter for immunization: Secondary | ICD-10-CM

## 2014-12-13 DIAGNOSIS — E1122 Type 2 diabetes mellitus with diabetic chronic kidney disease: Secondary | ICD-10-CM | POA: Diagnosis not present

## 2014-12-13 DIAGNOSIS — N189 Chronic kidney disease, unspecified: Secondary | ICD-10-CM

## 2014-12-13 LAB — POCT GLYCOSYLATED HEMOGLOBIN (HGB A1C): Hemoglobin A1C: 7.7

## 2014-12-13 MED ORDER — REPAGLINIDE 2 MG PO TABS: 2.0000 mg | ORAL_TABLET | Freq: Three times a day (TID) | ORAL | Status: DC

## 2014-12-13 NOTE — Patient Instructions (Addendum)
check your blood sugar once a day.  vary the time of day when you check, between before the 3 meals, and at bedtime.  also check if you have symptoms of your blood sugar being too high or too low.  please keep a record of the readings and bring it to your next appointment here.  You can write it on any piece of paper.  please call us sooner if your blood sugar goes below 70, or if you have a lot of readings over 200. Please resume taking the repaglinide.   Try moving the victoza injections around the abdomen, to see if that helps the rash.  Please come back for a regular physical appointment in 3 months.

## 2014-12-13 NOTE — Progress Notes (Signed)
Subjective:    Patient ID: David Lindsey, male    DOB: 1956/02/21, 59 y.o.   MRN: [id number]  HPI Pt returns for f/u of diabetes mellitus: DM type: 2 Dx'ed: 6812 Complications: renal insufficiency Therapy: victoza + metformin DKA: never.   Severe hypoglycemia: never.   Pancreatitis: never.   Other: he has never been on insulin; he did not tolerate invokana or parlodel (tremor); he cannot take pioglitizone (edema); he works 3rd shift.   Interval history: no cbg record, but states cbg's are well-controlled.  He denies hypoglycemia.  He does not take prandin.   He has slight rash at the abdomen, but no assoc pain Past Medical History  Diagnosis Date  . DIABETES MELLITUS, TYPE II 11/10/2006  . 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 HYPERCHOLESTEROLEMIA 12/22/2007  . HYPOKALEMIA 06/06/2008  . ANEMIA, IRON DEFICIENCY 12/26/2007  . Pancytopenia 06/06/2008  . ALCOHOLISM 12/22/2007  . HYPERTENSION 11/10/2006  . ESOPHAGITIS, REFLUX 12/22/2007  . RENAL INSUFFICIENCY 11/10/2006  . Leukopenia   . Neuropathy     Chronic Neuropathy leg pain    Past Surgical History  Procedure Laterality Date  . Appendectomy    . Esophagogastroduodenoscopy  08/20/2004  . Electrocardiogram  11/07/2006    Social History   Social History  . Marital Status: Married    Spouse Name: N/A  . Number of Children: N/A  . 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 Smoking status: Never Smoker   . Smokeless tobacco: Not on file  . Alcohol Use: No  . Drug Use: Not on file  . Sexual Activity: Not on file   Other Topics Concern  . Not on file   Social History Narrative    Current Outpatient Prescriptions on File Prior to Visit  Medication Sig Dispense Refill  . aspirin 81 MG tablet Take 81 mg by mouth daily.      . clotrimazole-betamethasone (LOTRISONE) cream Apply 3x a day as needed for rash 30 g 0  . fish oil-omega-3 fatty acids 1000 MG  capsule Take 3 capsules by mouth daily.      Marland Kitchen gabapentin (NEURONTIN) 300 MG capsule Take 2 capsules by mouth three times daily 540 capsule 1  . glucose blood (ONE TOUCH ULTRA TEST) test strip Use as instructed check blood sugar two times daily     . Lactulose 20 GM/30ML SOLN Take 60 mLs (40 g total) by mouth 2 (two) times daily. 1892 mL 11  . Liraglutide (VICTOZA) 18 MG/3ML SOPN Inject 0.3 mLs (1.8 mg total) into the skin daily. And pen needles 1/day 6 mL 11  . lisinopril-hydrochlorothiazide (PRINZIDE,ZESTORETIC) 10-12.5 MG per tablet Take 1 tablet by mouth daily. 90 tablet 3  . metFORMIN (GLUCOPHAGE-XR) 500 MG 24 hr tablet Take 2 tablets (1,000 mg total) by mouth daily with breakfast. 180 tablet 1  . omeprazole (PRILOSEC) 20 MG capsule Take 1 capsule (20 mg total) by mouth daily. 90 capsule 0  . potassium chloride SA (K-DUR,KLOR-CON) 20 MEQ tablet Take 1 tablet (20 mEq total) by mouth daily. 90 tablet 3  . simvastatin (ZOCOR) 40 MG tablet Take 1 tablet (40 mg total) by mouth at bedtime. 90 tablet 3  . testosterone cypionate (DEPO-TESTOSTERONE) 200 MG/ML injection Inject 0.5 mLs (100 mg total) into the muscle once a week. 10 mL 0  . traZODone (DESYREL) 50 MG tablet Take 1 tablet (50 mg total) by mouth  at bedtime. 90 tablet 3  . VIAGRA 100 MG tablet TAKE ONE TABLET BY MOUTH ONCE DAILY AS NEEDED 30 tablet 0   No current facility-administered medications on file prior to visit.    Allergies  Allergen Reactions  . Pioglitazone     REACTION: edema    Family History  Problem Relation Age of Onset  . Cancer Mother     Colon Cancer    BP 128/87 mmHg  Pulse 87  Temp(Src) 97.9 F (36.6 C) (Oral)  Ht 5\' 11"  (1.803 m)  Wt 290 lb (131.543 kg)  BMI 40.46 kg/m2  SpO2 96%  Review of Systems He denies hypoglycemia and itching    Objective:   Physical Exam VITAL SIGNS:  See vs page GENERAL: no distress Pulses: dorsalis pedis intact bilat.   MSK: no deformity of the feet CV: 1+ bilat leg  edema Skin:  no ulcer on the feet.  normal color and temp on the feet.  Slight hyperpigmentation at the ant abdomen Neuro: sensation is intact to touch on the feet Ext: There is bilateral onychomycosis of the toenails   A1c=7.7%    Assessment & Plan:  Rash, ? due to victoza, new DM: Needs increased rx, if it can be done with a regimen that avoids or minimizes hypoglycemia.    Patient is advised the following: Patient Instructions  check your blood sugar once a day.  vary the time of day when you check, between before the 3 meals, and at bedtime.  also check if you have symptoms of your blood sugar being too high or too low.  please keep a record of the readings and bring it to your next appointment here.  You can write it on any piece of paper.  please call us sooner if your blood sugar goes below 70, or if you have a lot of readings over 200. Please resume taking the repaglinide.   Try moving the victoza injections around the abdomen, to see if that helps the rash.  Please come back for a regular physical appointment in 3 months.

## 2015-01-30 ENCOUNTER — Encounter: Payer: Self-pay | Admitting: Endocrinology

## 2015-01-30 ENCOUNTER — Ambulatory Visit (INDEPENDENT_AMBULATORY_CARE_PROVIDER_SITE_OTHER): Payer: No Typology Code available for payment source | Admitting: Endocrinology

## 2015-01-30 VITALS — BP 122/82 | HR 95 | Temp 99.3°F | Resp 18 | Ht 71.0 in | Wt 290.0 lb

## 2015-01-30 DIAGNOSIS — Z0189 Encounter for other specified special examinations: Secondary | ICD-10-CM

## 2015-01-30 DIAGNOSIS — E7800 Pure hypercholesterolemia, unspecified: Secondary | ICD-10-CM

## 2015-01-30 DIAGNOSIS — E876 Hypokalemia: Secondary | ICD-10-CM | POA: Diagnosis not present

## 2015-01-30 DIAGNOSIS — E1122 Type 2 diabetes mellitus with diabetic chronic kidney disease: Secondary | ICD-10-CM

## 2015-01-30 DIAGNOSIS — Z0000 Encounter for general adult medical examination without abnormal findings: Secondary | ICD-10-CM | POA: Insufficient documentation

## 2015-01-30 DIAGNOSIS — N181 Chronic kidney disease, stage 1: Secondary | ICD-10-CM

## 2015-01-30 DIAGNOSIS — D61818 Other pancytopenia: Secondary | ICD-10-CM | POA: Diagnosis not present

## 2015-01-30 DIAGNOSIS — E291 Testicular hypofunction: Secondary | ICD-10-CM

## 2015-01-30 DIAGNOSIS — Z125 Encounter for screening for malignant neoplasm of prostate: Secondary | ICD-10-CM

## 2015-01-30 LAB — URINALYSIS, ROUTINE W REFLEX MICROSCOPIC
Bilirubin Urine: NEGATIVE
HGB URINE DIPSTICK: NEGATIVE
Ketones, ur: NEGATIVE
Leukocytes, UA: NEGATIVE
NITRITE: NEGATIVE
PH: 7 (ref 5.0–8.0)
Specific Gravity, Urine: 1.015 (ref 1.000–1.030)
TOTAL PROTEIN, URINE-UPE24: NEGATIVE
Urine Glucose: NEGATIVE
Urobilinogen, UA: 0.2 (ref 0.0–1.0)

## 2015-01-30 LAB — HEPATIC FUNCTION PANEL
ALK PHOS: 48 U/L (ref 39–117)
ALT: 24 U/L (ref 0–53)
AST: 20 U/L (ref 0–37)
Albumin: 4.2 g/dL (ref 3.5–5.2)
BILIRUBIN TOTAL: 0.5 mg/dL (ref 0.2–1.2)
Bilirubin, Direct: 0.1 mg/dL (ref 0.0–0.3)
Total Protein: 7.3 g/dL (ref 6.0–8.3)

## 2015-01-30 LAB — BASIC METABOLIC PANEL
BUN: 12 mg/dL (ref 6–23)
CALCIUM: 9.7 mg/dL (ref 8.4–10.5)
CO2: 26 meq/L (ref 19–32)
Chloride: 100 mEq/L (ref 96–112)
Creatinine, Ser: 1.48 mg/dL (ref 0.40–1.50)
GFR: 62.13 mL/min (ref >60.00)
GLUCOSE: 136 mg/dL — AB (ref 70–99)
Potassium: 4.4 mEq/L (ref 3.5–5.1)
SODIUM: 135 meq/L (ref 135–145)

## 2015-01-30 LAB — CBC WITH DIFFERENTIAL/PLATELET
Basophils Absolute: 0 10*3/uL (ref 0.0–0.1)
Basophils Relative: 0.5 % (ref 0.0–3.0)
EOS ABS: 0.2 10*3/uL (ref 0.0–0.7)
Eosinophils Relative: 5.9 % — ABNORMAL HIGH (ref 0.0–5.0)
HEMATOCRIT: 47.3 % (ref 39.0–52.0)
Hemoglobin: 15.4 g/dL (ref 13.0–17.0)
LYMPHS ABS: 1.3 10*3/uL (ref 0.7–4.0)
LYMPHS PCT: 35.7 % (ref 12.0–46.0)
MCHC: 32.5 g/dL (ref 30.0–36.0)
MCV: 86.4 fl (ref 78.0–100.0)
Monocytes Absolute: 0.5 10*3/uL (ref 0.1–1.0)
Monocytes Relative: 12.6 % — ABNORMAL HIGH (ref 3.0–12.0)
NEUTROS ABS: 1.7 10*3/uL (ref 1.4–7.7)
NEUTROS PCT: 45.3 % (ref 43.0–77.0)
PLATELETS: 170 10*3/uL (ref 150.0–400.0)
RBC: 5.48 Mil/uL (ref 4.22–5.81)
RDW: 14.2 % (ref 11.5–15.5)
WBC: 3.8 10*3/uL — ABNORMAL LOW (ref 4.0–10.5)

## 2015-01-30 LAB — MICROALBUMIN / CREATININE URINE RATIO
Creatinine,U: 135.6 mg/dL
Microalb Creat Ratio: 3.3 mg/g (ref 0.0–30.0)
Microalb, Ur: 4.5 mg/dL — ABNORMAL HIGH (ref 0.0–1.9)

## 2015-01-30 LAB — LIPID PANEL
Cholesterol: 118 mg/dL (ref 0–200)
HDL: 32.9 mg/dL — AB (ref >39.00)
LDL Cholesterol: 66 mg/dL (ref 0–99)
NONHDL: 85.55
Total CHOL/HDL Ratio: 4
Triglycerides: 96 mg/dL (ref 0.0–149.0)
VLDL: 19.2 mg/dL (ref 0.0–40.0)

## 2015-01-30 MED ORDER — SILDENAFIL CITRATE 100 MG PO TABS: ORAL_TABLET | ORAL | Status: DC

## 2015-01-30 MED ORDER — TESTOSTERONE CYPIONATE 200 MG/ML IM SOLN: 100.0000 mg | INTRAMUSCULAR | Status: DC

## 2015-01-30 NOTE — Progress Notes (Signed)
Subjective:    Patient ID: David Lindsey, male    DOB: [birth year], 59 y.o.   MRN: [id number]  HPI Pt is here for regular wellness examination, and is feeling pretty well in general, and says chronic med probs are stable, except as noted below Past Medical History  Diagnosis Date  . DIABETES MELLITUS, TYPE II 11/10/2006  . 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 HYPERCHOLESTEROLEMIA 12/22/2007  . HYPOKALEMIA 06/06/2008  . ANEMIA, IRON DEFICIENCY 12/26/2007  . Pancytopenia 06/06/2008  . ALCOHOLISM 12/22/2007  . HYPERTENSION 11/10/2006  . ESOPHAGITIS, REFLUX 12/22/2007  . RENAL INSUFFICIENCY 11/10/2006  . Leukopenia   . Neuropathy (HCC)     Chronic Neuropathy leg pain    Past Surgical History  Procedure Laterality Date  . Appendectomy    . Esophagogastroduodenoscopy  08/20/2004  . Electrocardiogram  11/07/2006    Social History   Social History  . Marital Status: Married    Spouse Name: N/A  . Number of Children: N/A  . 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 Smoking status: Never Smoker   . Smokeless tobacco: Not on file  . Alcohol Use: No  . Drug Use: Not on file  . Sexual Activity: Not on file   Other Topics Concern  . Not on file   Social History Narrative    Current Outpatient Prescriptions on File Prior to Visit  Medication Sig Dispense Refill  . aspirin 81 MG tablet Take 81 mg by mouth daily.      . clotrimazole-betamethasone (LOTRISONE) cream Apply 3x a day as needed for rash 30 g 0  . fish oil-omega-3 fatty acids 1000 MG capsule Take 3 capsules by mouth daily.      Marland Kitchen gabapentin (NEURONTIN) 300 MG capsule Take 2 capsules by mouth three times daily 540 capsule 1  . Lactulose 20 GM/30ML SOLN Take 60 mLs (40 g total) by mouth 2 (two) times daily. 1892 mL 11  . Liraglutide (VICTOZA) 18 MG/3ML SOPN Inject 0.3 mLs (1.8 mg total) into the skin daily. And pen needles 1/day 6 mL 11  .  lisinopril-hydrochlorothiazide (PRINZIDE,ZESTORETIC) 10-12.5 MG per tablet Take 1 tablet by mouth daily. 90 tablet 3  . metFORMIN (GLUCOPHAGE-XR) 500 MG 24 hr tablet Take 2 tablets (1,000 mg total) by mouth daily with breakfast. 180 tablet 1  . omeprazole (PRILOSEC) 20 MG capsule Take 1 capsule (20 mg total) by mouth daily. 90 capsule 0  . potassium chloride SA (K-DUR,KLOR-CON) 20 MEQ tablet Take 1 tablet (20 mEq total) by mouth daily. 90 tablet 3  . repaglinide (PRANDIN) 2 MG tablet Take 1 tablet (2 mg total) by mouth 3 (three) times daily before meals. 270 tablet 3  . simvastatin (ZOCOR) 40 MG tablet Take 1 tablet (40 mg total) by mouth at bedtime. 90 tablet 3  . traZODone (DESYREL) 50 MG tablet Take 1 tablet (50 mg total) by mouth at bedtime. 90 tablet 3  . glucose blood (ONE TOUCH ULTRA TEST) test strip Use as instructed check blood sugar two times daily      No current facility-administered medications on file prior to visit.    Allergies  Allergen Reactions  . Pioglitazone     REACTION: edema    Family History  Problem Relation Age of Onset  . Cancer Mother     Colon Cancer    BP 122/82 mmHg  Pulse 95  Temp(Src)  99.3 F (37.4 C) (Oral)  Resp 18  Ht 5\' 11"  (1.803 m)  Wt 290 lb (131.543 kg)  BMI 40.46 kg/m2  SpO2 94%  Review of Systems  Constitutional: Negative for fever and unexpected weight change.  HENT: Negative for hearing loss.   Eyes: Negative for visual disturbance.  Respiratory: Negative for shortness of breath.   Cardiovascular: Negative for chest pain.  Gastrointestinal: Negative for anal bleeding.  Endocrine: Negative for cold intolerance.  Genitourinary: Negative for hematuria and difficulty urinating.  Musculoskeletal: Negative for back pain.  Allergic/Immunologic: Negative for environmental allergies.  Neurological: Negative for syncope.  Hematological: Does not bruise/bleed easily.  Psychiatric/Behavioral: Negative for dysphoric mood.         Objective:   Physical Exam VS: see vs page GEN: no distress HEAD: head: no deformity eyes: no periorbital swelling, no proptosis external nose and ears are normal mouth: no lesion seen NECK: supple, thyroid is not enlarged CHEST WALL: no deformity LUNGS: clear to auscultation BREASTS:  No gynecomastia CV: reg rate and rhythm, no murmur ABD: abdomen is soft, nontender.  no hepatosplenomegaly.  not distended.  no hernia.  RECTAL: normal external and internal exam.  heme neg. PROSTATE:  Normal size.  No nodule MUSCULOSKELETAL: muscle bulk and strength are grossly normal.  no obvious joint swelling.  gait is normal and steady EXTEMITIES: no deformity.  no ulcer on the feet.  feet are of normal color and temp.  1+ bilat leg edema.  Old healed surgical scar at the right knee.   PULSES: dorsalis pedis intact bilat.  no carotid bruit NEURO:  cn 2-12 grossly intact.   readily moves all 4's.  sensation is intact to touch on the feet SKIN:  Normal texture and temperature.  No rash or suspicious lesion is visible.   NODES:  None palpable at the neck PSYCH: alert, well-oriented.  Does not appear anxious nor depressed.   i personally reviewed electrocardiogram tracing (today): Indication: DM Impression: normal    Assessment & Plan:  Wellness visit today, with problems stable, except as noted.    Patient is advised the following: Patient Instructions  blood tests are requested for you today.  We'll let you know about the results. please consider these measures for your health:  minimize alcohol.  do not use tobacco products.  have a colonoscopy at least every 10 years from age 55.  keep firearms safely stored.  always use seat belts.  have working smoke alarms in your home.  see an eye doctor and dentist regularly.  never drive under the influence of alcohol or drugs (including prescription drugs).   Please come back for a follow-up appointment in 3 months.   check your blood sugar once a  day.  vary the time of day when you check, between before the 3 meals, and at bedtime.  also check if you have symptoms of your blood sugar being too high or too low.  please keep a record of the readings and bring it to your next appointment here (or you can bring the meter itself).  You can write it on any piece of paper.  please call us sooner if your blood sugar goes below 70, or if you have a lot of readings over 200.

## 2015-01-30 NOTE — Progress Notes (Signed)
Pre visit review using our clinic review tool, if applicable. No additional management support is needed unless otherwise documented below in the visit note. 

## 2015-01-30 NOTE — Patient Instructions (Signed)
blood tests are requested for you today.  We'll let you know about the results. please consider these measures for your health:  minimize alcohol.  do not use tobacco products.  have a colonoscopy at least every 10 years from age 59.  keep firearms safely stored.  always use seat belts.  have working smoke alarms in your home.  see an eye doctor and dentist regularly.  never drive under the influence of alcohol or drugs (including prescription drugs).   Please come back for a follow-up appointment in 3 months.   check your blood sugar once a day.  vary the time of day when you check, between before the 3 meals, and at bedtime.  also check if you have symptoms of your blood sugar being too high or too low.  please keep a record of the readings and bring it to your next appointment here (or you can bring the meter itself).  You can write it on any piece of paper.  please call us sooner if your blood sugar goes below 70, or if you have a lot of readings over 200.

## 2015-01-31 LAB — TSH: TSH: 0.93 u[IU]/mL (ref 0.35–4.50)

## 2015-01-31 LAB — TESTOSTERONE,FREE AND TOTAL
TESTOSTERONE FREE: 11.7 pg/mL (ref 6.6–18.1)
TESTOSTERONE: 398 ng/dL (ref 348–1197)

## 2015-01-31 LAB — PSA: PSA: 0.96 ng/mL (ref 0.10–4.00)

## 2015-02-04 ENCOUNTER — Other Ambulatory Visit: Payer: Self-pay | Admitting: Endocrinology

## 2015-02-06 ENCOUNTER — Other Ambulatory Visit: Payer: Self-pay

## 2015-02-06 MED ORDER — METFORMIN HCL ER 500 MG PO TB24: 1000.0000 mg | ORAL_TABLET | Freq: Every day | ORAL | Status: DC

## 2015-02-19 ENCOUNTER — Other Ambulatory Visit: Payer: Self-pay | Admitting: Endocrinology

## 2015-03-10 ENCOUNTER — Encounter: Payer: Self-pay | Admitting: Internal Medicine

## 2015-03-31 ENCOUNTER — Other Ambulatory Visit: Payer: Self-pay | Admitting: Endocrinology

## 2015-04-13 HISTORY — PX: OTHER SURGICAL HISTORY: SHX169

## 2015-04-28 ENCOUNTER — Ambulatory Visit (AMBULATORY_SURGERY_CENTER): Payer: Self-pay | Admitting: *Deleted

## 2015-04-28 VITALS — Ht 72.0 in | Wt 287.0 lb

## 2015-04-28 DIAGNOSIS — Z800 Family history of malignant neoplasm of digestive organs: Secondary | ICD-10-CM

## 2015-04-28 MED ORDER — NA SULFATE-K SULFATE-MG SULF 17.5-3.13-1.6 GM/177ML PO SOLN: 1.0000 | Freq: Once | ORAL | Status: DC

## 2015-04-28 NOTE — Progress Notes (Signed)
No egg or soy allergy known to patient  No issues with past sedation with any surgeries  or procedures, no intubation problems  No diet pills per patient, no blood thinners  No home 02 use per patient

## 2015-05-02 ENCOUNTER — Ambulatory Visit: Payer: No Typology Code available for payment source | Admitting: Endocrinology

## 2015-05-12 ENCOUNTER — Encounter: Payer: Self-pay | Admitting: Internal Medicine

## 2015-05-12 ENCOUNTER — Ambulatory Visit (AMBULATORY_SURGERY_CENTER): Payer: No Typology Code available for payment source | Admitting: Internal Medicine

## 2015-05-12 VITALS — BP 128/79 | HR 72 | Temp 97.8°F | Resp 20 | Ht 72.0 in | Wt 287.0 lb

## 2015-05-12 DIAGNOSIS — Z1211 Encounter for screening for malignant neoplasm of colon: Secondary | ICD-10-CM

## 2015-05-12 DIAGNOSIS — Z800 Family history of malignant neoplasm of digestive organs: Secondary | ICD-10-CM

## 2015-05-12 LAB — GLUCOSE, CAPILLARY
GLUCOSE-CAPILLARY: 161 mg/dL — AB (ref 65–99)
GLUCOSE-CAPILLARY: 114 mg/dL — AB (ref 65–99)

## 2015-05-12 MED ORDER — SODIUM CHLORIDE 0.9 % IV SOLN: 500.0000 mL | INTRAVENOUS | Status: DC

## 2015-05-12 NOTE — Op Note (Signed)
[place]  [address] [place], [id number]   COLONOSCOPY PROCEDURE REPORT  PATIENT: David Lindsey, David Lindsey  MR#: [id number] BIRTHDATE: May 24, 1955 , 59  yrs. old GENDER: male ENDOSCOPIST: Eustace Quail, MD REFERRED [id number] Rupert Stacks, M.D. PROCEDURE DATE:  05/12/2015 PROCEDURE:   Colonoscopy, screening First Screening Colonoscopy - Avg.  risk and is 50 yrs.  old or older - No.  Prior Negative Screening - Now for repeat screening. 10 or more years since last screening  History of Adenoma - Now for follow-up colonoscopy & has been > or = to 3 yrs.  N/A  Polyps removed today? No Recommend repeat exam, <10 yrs? Yes high risk ASA CLASS:   Class II INDICATIONS:Screening for colonic neoplasia and FH Colon or Rectal Adenocarcinoma. . Mother mid 97s. Prior normal colonoscopies elsewhere 2000 and 2006 MEDICATIONS: Monitored anesthesia care and Propofol 330 mg IV  DESCRIPTION OF PROCEDURE:   After the risks benefits and alternatives of the procedure were thoroughly explained, informed consent was obtained.  The digital rectal exam revealed no abnormalities of the rectum.   The [id number]  endoscope was introduced through the anus and advanced to the cecum, which was identified by both the appendix and ileocecal valve. No adverse events experienced.   The quality of the prep was excellent. (Suprep was used)  The instrument was then slowly withdrawn as the colon was fully examined. Estimated blood loss is zero unless otherwise noted in this procedure report.  COLON FINDINGS: A normal appearing cecum, ileocecal valve, and appendiceal orifice were identified.  The ascending, transverse, descending, sigmoid colon, and rectum appeared unremarkable. Retroflexed views revealed no abnormalities. The time to cecum = 5.8 Withdrawal time = 9.4   The scope was withdrawn and the procedure completed. COMPLICATIONS: There were no immediate complications.  ENDOSCOPIC  IMPRESSION: 1. Normal colonoscopy  RECOMMENDATIONS: 1. Follow up colonoscopy in 5 years  (family history)  eSigned:  Eustace Quail, MD 05/12/2015 9:19 AM   cc: The Patient and Donavan Foil, MD

## 2015-05-12 NOTE — Progress Notes (Signed)
Report to PACU, RN, vss, BBS= Clear.  

## 2015-05-12 NOTE — Patient Instructions (Signed)
YOU HAD AN ENDOSCOPIC PROCEDURE TODAY AT THE [facility]:   Refer to the procedure report that was given to you for any specific questions about what was found during the examination.  If the procedure report does not answer your questions, please call your gastroenterologist to clarify.  If you requested that your care partner not be given the details of your procedure findings, then the procedure report has been included in a sealed envelope for you to review at your convenience later.  YOU SHOULD EXPECT: Some feelings of bloating in the abdomen. Passage of more gas than usual.  Walking can help get rid of the air that was put into your GI tract during the procedure and reduce the bloating. If you had a lower endoscopy (such as a colonoscopy or flexible sigmoidoscopy) you may notice spotting of blood in your stool or on the toilet paper. If you underwent a bowel prep for your procedure, you may not have a normal bowel movement for a few days.  Please Note:  You might notice some irritation and congestion in your nose or some drainage.  This is from the oxygen used during your procedure.  There is no need for concern and it should clear up in a day or so.  SYMPTOMS TO REPORT IMMEDIATELY:   Following lower endoscopy (colonoscopy or flexible sigmoidoscopy):  Excessive amounts of blood in the stool  Significant tenderness or worsening of abdominal pains  Swelling of the abdomen that is new, acute  Fever of 100F or higher   For urgent or emergent issues, a gastroenterologist can be reached at any hour by calling [phone].   DIET: Your first meal following the procedure should be a small meal and then it is ok to progress to your normal diet. Heavy or fried foods are harder to digest and may make you feel nauseous or bloated.  Likewise, meals heavy in dairy and vegetables can increase bloating.  Drink plenty of fluids but you should avoid alcoholic beverages for 24  hours.  ACTIVITY:  You should plan to take it easy for the rest of today and you should NOT DRIVE or use heavy machinery until tomorrow (because of the sedation medicines used during the test).    FOLLOW UP: Our staff will call the number listed on your records the next business day following your procedure to check on you and address any questions or concerns that you may have regarding the information given to you following your procedure. If we do not reach you, we will leave a message.  However, if you are feeling well and you are not experiencing any problems, there is no need to return our call.  We will assume that you have returned to your regular daily activities without incident.  If any biopsies were taken you will be contacted by phone or by letter within the next 1-3 weeks.  Please call us at [phone] if you have not heard about the biopsies in 3 weeks.    SIGNATURES/CONFIDENTIALITY: You and/or your care partner have signed paperwork which will be entered into your electronic medical record.  These signatures attest to the fact that that the information above on your After Visit Summary has been reviewed and is understood.  Full responsibility of the confidentiality of this discharge information lies with you and/or your care-partner. 

## 2015-05-13 ENCOUNTER — Telehealth: Payer: Self-pay | Admitting: *Deleted

## 2015-05-13 NOTE — Telephone Encounter (Signed)
  Follow up Call-  Call back number 05/12/2015  Post procedure Call Back phone  # [phone]  Permission to leave phone message Yes     Patient questions:  Do you have a fever, pain , or abdominal swelling? No. Pain Score  0 *  Have you tolerated food without any problems? Yes.    Have you been able to return to your normal activities? Yes.    Do you have any questions about your discharge instructions: Diet   No. Medications  No. Follow up visit  No.  Do you have questions or concerns about your Care? No.  Actions: * If pain score is 4 or above: No action needed, pain <4.

## 2015-05-20 ENCOUNTER — Other Ambulatory Visit: Payer: Self-pay | Admitting: Endocrinology

## 2015-05-21 ENCOUNTER — Other Ambulatory Visit: Payer: Self-pay | Admitting: Endocrinology

## 2015-07-03 ENCOUNTER — Other Ambulatory Visit: Payer: Self-pay | Admitting: Endocrinology

## 2015-08-11 ENCOUNTER — Telehealth: Payer: Self-pay | Admitting: Endocrinology

## 2015-08-11 MED ORDER — LISINOPRIL-HYDROCHLOROTHIAZIDE 10-12.5 MG PO TABS: ORAL_TABLET | ORAL | Status: DC

## 2015-08-11 NOTE — Telephone Encounter (Signed)
Prescription for 5 tablets sent to his local requested pharmacy. Done

## 2015-08-11 NOTE — Addendum Note (Signed)
Addended by: Peggyann Shoals on: 08/11/2015 11:07 AM   Modules accepted: Orders

## 2015-08-11 NOTE — Telephone Encounter (Signed)
Pt neds rx for lisinopril for 5 tablets temporary until he gets his med from his mail order please call into [facility] [address]

## 2015-08-19 ENCOUNTER — Other Ambulatory Visit: Payer: Self-pay | Admitting: Endocrinology

## 2015-09-10 ENCOUNTER — Other Ambulatory Visit: Payer: Self-pay | Admitting: Endocrinology

## 2015-10-24 ENCOUNTER — Other Ambulatory Visit: Payer: Self-pay | Admitting: Endocrinology

## 2015-10-24 NOTE — Telephone Encounter (Signed)
Rxs sent electronically.  

## 2015-10-24 NOTE — Telephone Encounter (Signed)
Please refill each x 30 days Ov is due

## 2015-10-24 NOTE — Telephone Encounter (Signed)
Trazodone last filled at mailed order 09-13-15 #90 Last OV 01-30-15 No Future OV

## 2015-11-06 ENCOUNTER — Other Ambulatory Visit: Payer: Self-pay | Admitting: Endocrinology

## 2015-11-06 ENCOUNTER — Telehealth: Payer: Self-pay | Admitting: Endocrinology

## 2015-11-06 DIAGNOSIS — E1122 Type 2 diabetes mellitus with diabetic chronic kidney disease: Secondary | ICD-10-CM

## 2015-11-06 DIAGNOSIS — Z125 Encounter for screening for malignant neoplasm of prostate: Secondary | ICD-10-CM

## 2015-11-06 DIAGNOSIS — Z0000 Encounter for general adult medical examination without abnormal findings: Secondary | ICD-10-CM

## 2015-11-06 DIAGNOSIS — E291 Testicular hypofunction: Secondary | ICD-10-CM

## 2015-11-06 DIAGNOSIS — D61818 Other pancytopenia: Secondary | ICD-10-CM

## 2015-11-06 DIAGNOSIS — N181 Chronic kidney disease, stage 1: Secondary | ICD-10-CM

## 2015-11-06 DIAGNOSIS — E876 Hypokalemia: Secondary | ICD-10-CM

## 2015-11-06 DIAGNOSIS — E7800 Pure hypercholesterolemia, unspecified: Secondary | ICD-10-CM

## 2015-11-06 NOTE — Telephone Encounter (Signed)
please call patient: Ov is due 

## 2015-11-07 NOTE — Telephone Encounter (Signed)
APPT HAS BEEN SCHEDULED

## 2015-11-12 ENCOUNTER — Other Ambulatory Visit: Payer: Self-pay | Admitting: Endocrinology

## 2015-11-12 DIAGNOSIS — Z0000 Encounter for general adult medical examination without abnormal findings: Secondary | ICD-10-CM

## 2015-11-12 DIAGNOSIS — E876 Hypokalemia: Secondary | ICD-10-CM

## 2015-11-12 DIAGNOSIS — E1122 Type 2 diabetes mellitus with diabetic chronic kidney disease: Secondary | ICD-10-CM

## 2015-11-12 DIAGNOSIS — D61818 Other pancytopenia: Secondary | ICD-10-CM

## 2015-11-12 DIAGNOSIS — Z125 Encounter for screening for malignant neoplasm of prostate: Secondary | ICD-10-CM

## 2015-11-12 DIAGNOSIS — E7800 Pure hypercholesterolemia, unspecified: Secondary | ICD-10-CM

## 2015-11-12 DIAGNOSIS — E291 Testicular hypofunction: Secondary | ICD-10-CM

## 2015-11-12 DIAGNOSIS — N181 Chronic kidney disease, stage 1: Secondary | ICD-10-CM

## 2015-11-17 ENCOUNTER — Other Ambulatory Visit: Payer: Self-pay | Admitting: Endocrinology

## 2015-11-19 ENCOUNTER — Other Ambulatory Visit: Payer: Self-pay | Admitting: Endocrinology

## 2015-11-19 DIAGNOSIS — E1122 Type 2 diabetes mellitus with diabetic chronic kidney disease: Secondary | ICD-10-CM

## 2015-11-19 DIAGNOSIS — Z0000 Encounter for general adult medical examination without abnormal findings: Secondary | ICD-10-CM

## 2015-11-19 DIAGNOSIS — E291 Testicular hypofunction: Secondary | ICD-10-CM

## 2015-11-19 DIAGNOSIS — E7800 Pure hypercholesterolemia, unspecified: Secondary | ICD-10-CM

## 2015-11-19 DIAGNOSIS — E876 Hypokalemia: Secondary | ICD-10-CM

## 2015-11-19 DIAGNOSIS — Z125 Encounter for screening for malignant neoplasm of prostate: Secondary | ICD-10-CM

## 2015-11-19 DIAGNOSIS — N181 Chronic kidney disease, stage 1: Secondary | ICD-10-CM

## 2015-11-19 DIAGNOSIS — D61818 Other pancytopenia: Secondary | ICD-10-CM

## 2015-11-19 NOTE — Telephone Encounter (Signed)
Patient nee refill testosterone cypionate (DEPOTESTOSTERONE CYPIONATE) 200 MG/ML injection.   [place], [place] [phone] (Phone) [phone] (Fax)

## 2015-11-19 NOTE — Telephone Encounter (Signed)
Options: I'll be back on 11/23/15 Another dr can refill x 1 in my absence.

## 2015-12-02 ENCOUNTER — Ambulatory Visit: Payer: No Typology Code available for payment source | Admitting: Endocrinology

## 2015-12-09 ENCOUNTER — Ambulatory Visit: Payer: No Typology Code available for payment source | Admitting: Endocrinology

## 2015-12-19 ENCOUNTER — Other Ambulatory Visit: Payer: Self-pay | Admitting: Endocrinology

## 2015-12-19 NOTE — Telephone Encounter (Signed)
Please refill x 1 Ov is due  

## 2016-01-20 ENCOUNTER — Other Ambulatory Visit: Payer: Self-pay | Admitting: Endocrinology

## 2016-01-21 ENCOUNTER — Other Ambulatory Visit: Payer: Self-pay

## 2016-01-21 MED ORDER — REPAGLINIDE 2 MG PO TABS: 2.0000 mg | ORAL_TABLET | Freq: Three times a day (TID) | ORAL | 3 refills | Status: DC

## 2016-01-23 ENCOUNTER — Other Ambulatory Visit: Payer: Self-pay

## 2016-01-23 ENCOUNTER — Other Ambulatory Visit: Payer: Self-pay | Admitting: Endocrinology

## 2016-01-23 MED ORDER — SIMVASTATIN 40 MG PO TABS: 40.0000 mg | ORAL_TABLET | Freq: Every day | ORAL | 0 refills | Status: DC

## 2016-01-26 ENCOUNTER — Other Ambulatory Visit: Payer: Self-pay

## 2016-01-26 MED ORDER — LISINOPRIL-HYDROCHLOROTHIAZIDE 10-12.5 MG PO TABS: 1.0000 | ORAL_TABLET | Freq: Every day | ORAL | 0 refills | Status: DC

## 2016-01-26 MED ORDER — POTASSIUM CHLORIDE ER 20 MEQ PO TBCR: 1.0000 | EXTENDED_RELEASE_TABLET | Freq: Every day | ORAL | 0 refills | Status: DC

## 2016-02-02 ENCOUNTER — Ambulatory Visit (INDEPENDENT_AMBULATORY_CARE_PROVIDER_SITE_OTHER): Payer: No Typology Code available for payment source | Admitting: Endocrinology

## 2016-02-02 ENCOUNTER — Encounter: Payer: Self-pay | Admitting: Endocrinology

## 2016-02-02 VITALS — BP 128/84 | HR 86 | Ht 72.0 in | Wt 288.0 lb

## 2016-02-02 DIAGNOSIS — E1122 Type 2 diabetes mellitus with diabetic chronic kidney disease: Secondary | ICD-10-CM

## 2016-02-02 DIAGNOSIS — Z125 Encounter for screening for malignant neoplasm of prostate: Secondary | ICD-10-CM

## 2016-02-02 DIAGNOSIS — E291 Testicular hypofunction: Secondary | ICD-10-CM | POA: Diagnosis not present

## 2016-02-02 DIAGNOSIS — Z23 Encounter for immunization: Secondary | ICD-10-CM

## 2016-02-02 DIAGNOSIS — N181 Chronic kidney disease, stage 1: Secondary | ICD-10-CM | POA: Diagnosis not present

## 2016-02-02 DIAGNOSIS — Z0000 Encounter for general adult medical examination without abnormal findings: Secondary | ICD-10-CM | POA: Diagnosis not present

## 2016-02-02 LAB — CBC WITH DIFFERENTIAL/PLATELET
BASOS PCT: 0.5 % (ref 0.0–3.0)
Basophils Absolute: 0 10*3/uL (ref 0.0–0.1)
EOS ABS: 0.4 10*3/uL (ref 0.0–0.7)
Eosinophils Relative: 8.8 % — ABNORMAL HIGH (ref 0.0–5.0)
HCT: 41.4 % (ref 39.0–52.0)
HEMOGLOBIN: 14 g/dL (ref 13.0–17.0)
Lymphocytes Relative: 41.5 % (ref 12.0–46.0)
Lymphs Abs: 1.8 10*3/uL (ref 0.7–4.0)
MCHC: 33.8 g/dL (ref 30.0–36.0)
MCV: 86 fl (ref 78.0–100.0)
MONO ABS: 0.4 10*3/uL (ref 0.1–1.0)
Monocytes Relative: 9 % (ref 3.0–12.0)
NEUTROS ABS: 1.7 10*3/uL (ref 1.4–7.7)
NEUTROS PCT: 40.2 % — AB (ref 43.0–77.0)
PLATELETS: 157 10*3/uL (ref 150.0–400.0)
RBC: 4.81 Mil/uL (ref 4.22–5.81)
RDW: 13 % (ref 11.5–15.5)
WBC: 4.3 10*3/uL (ref 4.0–10.5)

## 2016-02-02 LAB — HIV ANTIBODY (ROUTINE TESTING W REFLEX): HIV: NONREACTIVE

## 2016-02-02 LAB — HEPATIC FUNCTION PANEL
ALT: 27 U/L (ref 0–53)
AST: 21 U/L (ref 0–37)
Albumin: 4.4 g/dL (ref 3.5–5.2)
Alkaline Phosphatase: 60 U/L (ref 39–117)
BILIRUBIN TOTAL: 0.4 mg/dL (ref 0.2–1.2)
Bilirubin, Direct: 0.1 mg/dL (ref 0.0–0.3)
Total Protein: 7.3 g/dL (ref 6.0–8.3)

## 2016-02-02 LAB — LIPID PANEL
CHOLESTEROL: 169 mg/dL (ref 0–200)
HDL: 39.1 mg/dL (ref >39.00)
LDL Cholesterol: 104 mg/dL — ABNORMAL HIGH (ref 0–99)
NonHDL: 129.45
TRIGLYCERIDES: 127 mg/dL (ref 0.0–149.0)
Total CHOL/HDL Ratio: 4
VLDL: 25.4 mg/dL (ref 0.0–40.0)

## 2016-02-02 LAB — BASIC METABOLIC PANEL
BUN: 18 mg/dL (ref 6–23)
CHLORIDE: 98 meq/L (ref 96–112)
CO2: 29 mEq/L (ref 19–32)
Calcium: 9.6 mg/dL (ref 8.4–10.5)
Creatinine, Ser: 1.77 mg/dL — ABNORMAL HIGH (ref 0.40–1.50)
GFR: 50.37 mL/min — ABNORMAL LOW (ref >60.00)
GLUCOSE: 124 mg/dL — AB (ref 70–99)
POTASSIUM: 4.4 meq/L (ref 3.5–5.1)
Sodium: 135 mEq/L (ref 135–145)

## 2016-02-02 LAB — PSA: PSA: 1.01 ng/mL (ref 0.10–4.00)

## 2016-02-02 LAB — LUTEINIZING HORMONE: LH: 12.09 m[IU]/mL — ABNORMAL HIGH (ref 1.50–9.30)

## 2016-02-02 LAB — POCT GLYCOSYLATED HEMOGLOBIN (HGB A1C): HEMOGLOBIN A1C: 8

## 2016-02-02 LAB — TSH: TSH: 1.73 u[IU]/mL (ref 0.35–4.50)

## 2016-02-02 LAB — HEPATITIS C ANTIBODY: HCV Ab: NEGATIVE

## 2016-02-02 MED ORDER — GLIMEPIRIDE 4 MG PO TABS: 4.0000 mg | ORAL_TABLET | Freq: Every day | ORAL | 3 refills | Status: DC

## 2016-02-02 NOTE — Progress Notes (Signed)
Subjective:    Patient ID: David Lindsey, male    DOB: 1955-07-30, 60 y.o.   MRN: [id number]  HPI Pt is here for regular wellness examination, and is feeling pretty well in general, and says chronic med probs are stable, except as noted below He has been out of testosterone, due to insurance.  Past Medical History:  Diagnosis Date  . ALCOHOLISM 12/22/2007  . ANEMIA, IRON DEFICIENCY 12/26/2007  . Arthritis   . Blood transfusion without reported diagnosis    as a child  . DIABETES MELLITUS, TYPE II 11/10/2006  . ESOPHAGITIS, REFLUX 12/22/2007  . GERD (gastroesophageal reflux disease)   . HYPERCHOLESTEROLEMIA 12/22/2007  . HYPERTENSION 11/10/2006  . HYPOGONADISM 12/22/2007   Prob. due to ETOH  . HYPOKALEMIA 06/06/2008  . Leukopenia   . Neuromuscular disorder ([place])   . Neuropathy ([facility])    Chronic Neuropathy leg pain  . Pancytopenia 06/06/2008  . RENAL INSUFFICIENCY 11/10/2006    Past Surgical History:  Procedure Laterality Date  . APPENDECTOMY    . COLONOSCOPY  08-21-2004   [facility]- normal  . ELECTROCARDIOGRAM  11/07/2006  . ESOPHAGOGASTRODUODENOSCOPY  08/20/2004  . REPLACEMENT TOTAL KNEE Right   . UPPER GASTROINTESTINAL ENDOSCOPY      Social History   Social History  . Marital status: Married    Spouse name: N/A  . Number of children: N/A  . 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 Smoking status: Never Smoker  . Smokeless tobacco: Never Used  . Alcohol use No  . Drug use: No  . Sexual activity: Not on file   Other Topics Concern  . Not on file   Social History Narrative  . No narrative on file    Current Outpatient Prescriptions on File Prior to Visit  Medication Sig Dispense Refill  . aspirin 81 MG tablet Take 81 mg by mouth daily.      . clotrimazole-betamethasone (LOTRISONE) cream Apply 3x a day as needed for rash 30 g 0  . fish oil-omega-3 fatty acids 1000 MG capsule Take 3 capsules by mouth  daily.      Marland Kitchen gabapentin (NEURONTIN) 300 MG capsule TAKE 2 CAPSULES BY MOUTH 3  TIMES A DAY 540 capsule 3  . glucose blood (ONE TOUCH ULTRA TEST) test strip Use as instructed check blood sugar two times daily     . lisinopril-hydrochlorothiazide (PRINZIDE,ZESTORETIC) 10-12.5 MG tablet Take 1 tablet by mouth daily. 90 tablet 0  . metFORMIN (GLUCOPHAGE-XR) 500 MG 24 hr tablet TAKE 2 TABLETS BY MOUTH  DAILY WITH BREAKFAST 180 tablet 3  . omeprazole (PRILOSEC) 20 MG capsule TAKE ONE CAPSULE BY MOUTH ONCE DAILY 90 capsule 0  . Potassium Chloride ER 20 MEQ TBCR Take 1 tablet by mouth daily. 90 tablet 0  . simvastatin (ZOCOR) 40 MG tablet Take 1 tablet (40 mg total) by mouth at bedtime. 90 tablet 0  . testosterone cypionate (DEPOTESTOSTERONE CYPIONATE) 200 MG/ML injection INJECT 0.5MLS (100MG ) INTO THE MUSCLE ONCE A WEEK 4 mL 2  . traZODone (DESYREL) 50 MG tablet TAKE 1 TABLET BY MOUTH AT  BEDTIME 90 tablet 3  . VICTOZA 18 MG/3ML SOPN INJECT 1.8MG  UNDER THE SKIN DAILY 9 mL 0  . potassium chloride SA (K-DUR,KLOR-CON) 20 MEQ tablet Take 1 tablet (20 mEq total) by mouth daily. 90 tablet 3   No current facility-administered medications on file prior to visit.     Allergies  Allergen Reactions  . Pioglitazone     REACTION: edema    Family History  Problem Relation Age of Onset  . Cancer Mother     Colon Cancer  . Colon cancer Mother   . Colon polyps Brother   . Esophageal cancer Neg Hx   . Rectal cancer Neg Hx   . Stomach cancer Neg Hx     BP 128/84   Pulse 86   Ht 6' (1.829 m)   Wt 288 lb (130.6 kg)   SpO2 95%   BMI 39.06 kg/m    Review of Systems  Constitutional: Negative for fever and unexpected weight change.  HENT: Negative for hearing loss.   Eyes: Negative for visual disturbance.  Respiratory: Negative for shortness of breath.   Cardiovascular: Negative for chest pain.  Gastrointestinal: Negative for blood in stool.  Endocrine: Negative for cold intolerance.    Genitourinary: Negative for difficulty urinating and hematuria.  Musculoskeletal: Negative for back pain.  Skin: Negative for rash.  Allergic/Immunologic: Negative for environmental allergies.  Neurological: Negative for syncope, numbness and headaches.  Hematological: Does not bruise/bleed easily.  Psychiatric/Behavioral: Negative for dysphoric mood.      Objective:   Physical Exam VS: see vs page GEN: no distress HEAD: head: no deformity eyes: no periorbital swelling, no proptosis external nose and ears are normal.  mouth: no lesion seen NECK: supple, thyroid is not enlarged CHEST WALL: no deformity LUNGS: clear to auscultation BREASTS:  bilat pseusogynecomastia CV: reg rate and rhythm, no murmur ABD: abdomen is soft, nontender.  no hepatosplenomegaly.  not distended.  no hernia MUSCULOSKELETAL: muscle bulk and strength are grossly normal.  no obvious joint swelling.  gait is normal and steady PULSES: no carotid bruit NEURO:  cn 2-12 grossly intact.   readily moves all 4's. SKIN:  Normal texture and temperature.  No rash or suspicious lesion is visible.   NODES:  None palpable at the neck.   PSYCH: alert, well-oriented.  Does not appear anxious nor depressed.     ecg machine is not working    Assessment & Plan:  Wellness visit today, with problems stable, except as noted.  SEPARATE EVALUATION FOLLOWS--EACH PROBLEM HERE IS NEW, NOT RESPONDING TO TREATMENT, OR POSES SIGNIFICANT RISK TO THE PATIENT'S HEALTH: HISTORY OF THE PRESENT ILLNESS: PAST MEDICAL HISTORY Pt returns for f/u of diabetes mellitus: DM type: 2 Dx'ed: 0000000 Complications: renal insufficiency Therapy: victoza + metformin DKA: never.   Severe hypoglycemia: never.   Pancreatitis: never.   Other: he has never been on insulin; he did not tolerate invokana or parlodel (tremor); he cannot take pioglitizone (edema); he works 3rd shift.   Interval history: no cbg record, but states cbg's are well-controlled.   He often misses the prandin.  REVIEW OF SYSTEMS: He denies hypoglycemia. PHYSICAL EXAMINATION: VITAL SIGNS:  See vs page GENERAL: no distress Pulses: dorsalis pedis intact bilat.   MSK: no deformity of the feet CV: 1+ bilat leg edema Skin:  no ulcer on the feet.  normal color and temp on the feet. Neuro: sensation is intact to touch on the feet  LAB/XRAY RESULTS: Lab Results  Component Value Date   TESTOSTERONE 136 (L) 02/02/2016   Lab Results  Component Value Date   HGBA1C 8.0 02/02/2016  IMPRESSION: Hypogonadism, mild.  LH is too high for clomid Type 2 DM, with renal insufficiency: he needs increased rx. Noncompliance with cbg recording and medication: he needs to change repaglinide to a qd med.  PLAN:  I have sent a prescription to your pharmacy, to change repaglinide to glimepiride.   I advised pt to resume low-dose IM testosterone, even if he has to buy it.  Please come back for a follow-up appointment in 3 months.

## 2016-02-02 NOTE — Patient Instructions (Addendum)
good diet and exercise significantly improve the control of your diabetes.  please let me know if you wish to be referred to a dietician.  high blood sugar is very risky to your health.  you should see an eye doctor and dentist every year.  It is very important to get all recommended vaccinations.  Controlling your blood pressure and cholesterol drastically reduces the damage diabetes does to your body.  Those who smoke should quit.  Please discuss these with your doctor.  Please consider these measures for your health:  minimize alcohol.  Do not use tobacco products.  Have a colonoscopy at least every 10 years from age 6.  Keep firearms safely stored.  Always use seat belts.  have working smoke alarms in your home.  See an eye doctor and dentist regularly.  Never drive under the influence of alcohol or drugs (including prescription drugs).  I have sent a prescription to your pharmacy, to change repaglinide to glimepiride. Please come back for a follow-up appointment in 3 months.

## 2016-02-03 ENCOUNTER — Other Ambulatory Visit: Payer: Self-pay | Admitting: Endocrinology

## 2016-02-03 DIAGNOSIS — Z125 Encounter for screening for malignant neoplasm of prostate: Secondary | ICD-10-CM

## 2016-02-03 DIAGNOSIS — N181 Chronic kidney disease, stage 1: Secondary | ICD-10-CM

## 2016-02-03 DIAGNOSIS — E7800 Pure hypercholesterolemia, unspecified: Secondary | ICD-10-CM

## 2016-02-03 DIAGNOSIS — D61818 Other pancytopenia: Secondary | ICD-10-CM

## 2016-02-03 DIAGNOSIS — Z0000 Encounter for general adult medical examination without abnormal findings: Secondary | ICD-10-CM

## 2016-02-03 DIAGNOSIS — E291 Testicular hypofunction: Secondary | ICD-10-CM

## 2016-02-03 DIAGNOSIS — E876 Hypokalemia: Secondary | ICD-10-CM

## 2016-02-03 DIAGNOSIS — E1122 Type 2 diabetes mellitus with diabetic chronic kidney disease: Secondary | ICD-10-CM

## 2016-02-03 LAB — PROLACTIN: PROLACTIN: 3.6 ng/mL (ref 2.0–18.0)

## 2016-02-03 LAB — TESTOSTERONE,FREE AND TOTAL
TESTOSTERONE FREE: 6.1 pg/mL — AB (ref 6.6–18.1)
TESTOSTERONE: 136 ng/dL — AB (ref 264–916)

## 2016-02-03 LAB — MICROALBUMIN / CREATININE URINE RATIO
Creatinine,U: 128.6 mg/dL
Microalb Creat Ratio: 2.6 mg/g (ref 0.0–30.0)
Microalb, Ur: 3.4 mg/dL — ABNORMAL HIGH (ref 0.0–1.9)

## 2016-02-04 ENCOUNTER — Telehealth: Payer: Self-pay | Admitting: Endocrinology

## 2016-02-04 DIAGNOSIS — E1122 Type 2 diabetes mellitus with diabetic chronic kidney disease: Secondary | ICD-10-CM

## 2016-02-04 DIAGNOSIS — E876 Hypokalemia: Secondary | ICD-10-CM

## 2016-02-04 DIAGNOSIS — E7800 Pure hypercholesterolemia, unspecified: Secondary | ICD-10-CM

## 2016-02-04 DIAGNOSIS — N181 Chronic kidney disease, stage 1: Secondary | ICD-10-CM

## 2016-02-04 DIAGNOSIS — E291 Testicular hypofunction: Secondary | ICD-10-CM

## 2016-02-04 DIAGNOSIS — Z125 Encounter for screening for malignant neoplasm of prostate: Secondary | ICD-10-CM

## 2016-02-04 DIAGNOSIS — Z0000 Encounter for general adult medical examination without abnormal findings: Secondary | ICD-10-CM

## 2016-02-04 DIAGNOSIS — D61818 Other pancytopenia: Secondary | ICD-10-CM

## 2016-02-04 MED ORDER — TESTOSTERONE CYPIONATE 200 MG/ML IM SOLN: 25.0000 mg | INTRAMUSCULAR | 2 refills | Status: DC

## 2016-02-04 NOTE — Telephone Encounter (Signed)
Refill faxed patient advised.

## 2016-02-04 NOTE — Telephone Encounter (Signed)
I printed rx. I'll see you next time.

## 2016-02-16 ENCOUNTER — Other Ambulatory Visit: Payer: Self-pay | Admitting: Endocrinology

## 2016-03-08 ENCOUNTER — Other Ambulatory Visit: Payer: Self-pay | Admitting: Endocrinology

## 2016-03-31 ENCOUNTER — Encounter: Payer: Self-pay | Admitting: Endocrinology

## 2016-03-31 LAB — HM DIABETES EYE EXAM

## 2016-05-04 ENCOUNTER — Ambulatory Visit: Payer: No Typology Code available for payment source | Admitting: Endocrinology

## 2016-05-13 ENCOUNTER — Other Ambulatory Visit: Payer: Self-pay

## 2016-05-13 MED ORDER — LISINOPRIL-HYDROCHLOROTHIAZIDE 10-12.5 MG PO TABS: 1.0000 | ORAL_TABLET | Freq: Every day | ORAL | 1 refills | Status: DC

## 2016-05-25 ENCOUNTER — Other Ambulatory Visit: Payer: Self-pay | Admitting: Endocrinology

## 2016-07-04 ENCOUNTER — Other Ambulatory Visit: Payer: Self-pay | Admitting: Endocrinology

## 2016-09-01 ENCOUNTER — Other Ambulatory Visit: Payer: Self-pay | Admitting: Endocrinology

## 2016-09-25 ENCOUNTER — Other Ambulatory Visit: Payer: Self-pay | Admitting: Endocrinology

## 2016-09-25 NOTE — Telephone Encounter (Signed)
Please refill x 3 mos Ov is due 

## 2016-11-29 ENCOUNTER — Other Ambulatory Visit: Payer: Self-pay | Admitting: Endocrinology

## 2016-12-27 ENCOUNTER — Other Ambulatory Visit: Payer: Self-pay | Admitting: Endocrinology

## 2017-01-13 ENCOUNTER — Encounter: Payer: Self-pay | Admitting: Endocrinology

## 2017-01-13 ENCOUNTER — Ambulatory Visit (INDEPENDENT_AMBULATORY_CARE_PROVIDER_SITE_OTHER): Payer: No Typology Code available for payment source | Admitting: Endocrinology

## 2017-01-13 VITALS — BP 138/82 | HR 93 | Wt 290.4 lb

## 2017-01-13 DIAGNOSIS — E1122 Type 2 diabetes mellitus with diabetic chronic kidney disease: Secondary | ICD-10-CM | POA: Diagnosis not present

## 2017-01-13 DIAGNOSIS — Z125 Encounter for screening for malignant neoplasm of prostate: Secondary | ICD-10-CM

## 2017-01-13 DIAGNOSIS — Z0000 Encounter for general adult medical examination without abnormal findings: Secondary | ICD-10-CM

## 2017-01-13 DIAGNOSIS — E876 Hypokalemia: Secondary | ICD-10-CM

## 2017-01-13 DIAGNOSIS — R609 Edema, unspecified: Secondary | ICD-10-CM

## 2017-01-13 DIAGNOSIS — E291 Testicular hypofunction: Secondary | ICD-10-CM | POA: Diagnosis not present

## 2017-01-13 DIAGNOSIS — E7800 Pure hypercholesterolemia, unspecified: Secondary | ICD-10-CM | POA: Diagnosis not present

## 2017-01-13 DIAGNOSIS — D61818 Other pancytopenia: Secondary | ICD-10-CM

## 2017-01-13 DIAGNOSIS — N181 Chronic kidney disease, stage 1: Secondary | ICD-10-CM

## 2017-01-13 LAB — LIPID PANEL
CHOL/HDL RATIO: 4
Cholesterol: 131 mg/dL (ref 0–200)
HDL: 33.6 mg/dL — AB (ref >39.00)
LDL Cholesterol: 68 mg/dL (ref 0–99)
NONHDL: 97.39
Triglycerides: 147 mg/dL (ref 0.0–149.0)
VLDL: 29.4 mg/dL (ref 0.0–40.0)

## 2017-01-13 LAB — BASIC METABOLIC PANEL
BUN: 19 mg/dL (ref 6–23)
CHLORIDE: 99 meq/L (ref 96–112)
CO2: 26 meq/L (ref 19–32)
Calcium: 9.7 mg/dL (ref 8.4–10.5)
Creatinine, Ser: 1.96 mg/dL — ABNORMAL HIGH (ref 0.40–1.50)
GFR: 44.64 mL/min — ABNORMAL LOW (ref >60.00)
Glucose, Bld: 208 mg/dL — ABNORMAL HIGH (ref 70–99)
POTASSIUM: 4.4 meq/L (ref 3.5–5.1)
Sodium: 134 mEq/L — ABNORMAL LOW (ref 135–145)

## 2017-01-13 LAB — CBC WITH DIFFERENTIAL/PLATELET
BASOS ABS: 0 10*3/uL (ref 0.0–0.1)
Basophils Relative: 0.6 % (ref 0.0–3.0)
EOS ABS: 0.3 10*3/uL (ref 0.0–0.7)
Eosinophils Relative: 7.9 % — ABNORMAL HIGH (ref 0.0–5.0)
HEMATOCRIT: 40.7 % (ref 39.0–52.0)
Hemoglobin: 13.4 g/dL (ref 13.0–17.0)
LYMPHS ABS: 1.2 10*3/uL (ref 0.7–4.0)
LYMPHS PCT: 31.9 % (ref 12.0–46.0)
MCHC: 33.1 g/dL (ref 30.0–36.0)
MCV: 87.4 fl (ref 78.0–100.0)
MONOS PCT: 10.2 % (ref 3.0–12.0)
Monocytes Absolute: 0.4 10*3/uL (ref 0.1–1.0)
NEUTROS ABS: 1.9 10*3/uL (ref 1.4–7.7)
NEUTROS PCT: 49.4 % (ref 43.0–77.0)
PLATELETS: 160 10*3/uL (ref 150.0–400.0)
RBC: 4.65 Mil/uL (ref 4.22–5.81)
RDW: 13 % (ref 11.5–15.5)
WBC: 3.8 10*3/uL — ABNORMAL LOW (ref 4.0–10.5)

## 2017-01-13 LAB — HEPATIC FUNCTION PANEL
ALK PHOS: 63 U/L (ref 39–117)
ALT: 18 U/L (ref 0–53)
AST: 12 U/L (ref 0–37)
Albumin: 4.2 g/dL (ref 3.5–5.2)
BILIRUBIN DIRECT: 0.1 mg/dL (ref 0.0–0.3)
BILIRUBIN TOTAL: 0.5 mg/dL (ref 0.2–1.2)
TOTAL PROTEIN: 7.1 g/dL (ref 6.0–8.3)

## 2017-01-13 LAB — PSA: PSA: 1.32 ng/mL (ref 0.10–4.00)

## 2017-01-13 LAB — TSH: TSH: 1.05 u[IU]/mL (ref 0.35–4.50)

## 2017-01-13 LAB — BRAIN NATRIURETIC PEPTIDE: PRO B NATRI PEPTIDE: 7 pg/mL (ref 0.0–100.0)

## 2017-01-13 LAB — POCT GLYCOSYLATED HEMOGLOBIN (HGB A1C): HEMOGLOBIN A1C: 9.2

## 2017-01-13 MED ORDER — GLIMEPIRIDE 2 MG PO TABS: 2.0000 mg | ORAL_TABLET | Freq: Every day | ORAL | 3 refills | Status: DC

## 2017-01-13 MED ORDER — METFORMIN HCL ER 500 MG PO TB24: 500.0000 mg | ORAL_TABLET | Freq: Every day | ORAL | 3 refills | Status: DC

## 2017-01-13 MED ORDER — LISINOPRIL-HYDROCHLOROTHIAZIDE 10-12.5 MG PO TABS: 0.5000 | ORAL_TABLET | Freq: Every day | ORAL | 1 refills | Status: DC

## 2017-01-13 MED ORDER — CANAGLIFLOZIN 100 MG PO TABS: 100.0000 mg | ORAL_TABLET | Freq: Every day | ORAL | 11 refills | Status: DC

## 2017-01-13 MED ORDER — SILDENAFIL CITRATE 100 MG PO TABS: ORAL_TABLET | ORAL | 29 refills | Status: DC

## 2017-01-13 NOTE — Patient Instructions (Addendum)
Please reduce the lisinopril and glimepiride each to 1/2 pill per day. I have sent a prescription to your pharmacy, for an additional diabetes pill. Please come back for a follow-up appointment in 2 months.  blood tests are requested for you today.  We'll let you know about the results.  We may be able to reduce the potassium pill.   Please consider these measures for your health:  minimize alcohol.  Do not use tobacco products.  Have a colonoscopy at least every 10 years from age 93.  Keep firearms safely stored.  Always use seat belts.  have working smoke alarms in your home.  See an eye doctor and dentist regularly.  Never drive under the influence of alcohol or drugs (including prescription drugs).

## 2017-01-13 NOTE — Progress Notes (Signed)
Subjective:    Patient ID: David Lindsey, male    DOB: [birth year], 61 y.o.   MRN: [id number]  HPI Pt is here for regular wellness examination, and is feeling pretty well in general, and says chronic med probs are stable, except as noted below Past Medical History:  Diagnosis Date  . ALCOHOLISM 12/22/2007  . ANEMIA, IRON DEFICIENCY 12/26/2007  . Arthritis   . Blood transfusion without reported diagnosis    as a child  . DIABETES MELLITUS, TYPE II 11/10/2006  . ESOPHAGITIS, REFLUX 12/22/2007  . GERD (gastroesophageal reflux disease)   . HYPERCHOLESTEROLEMIA 12/22/2007  . HYPERTENSION 11/10/2006  . HYPOGONADISM 12/22/2007   Prob. due to ETOH  . HYPOKALEMIA 06/06/2008  . Leukopenia   . Neuromuscular disorder ([place])   . Neuropathy    Chronic Neuropathy leg pain  . Pancytopenia 06/06/2008  . RENAL INSUFFICIENCY 11/10/2006    Past Surgical History:  Procedure Laterality Date  . APPENDECTOMY    . COLONOSCOPY  08-21-2004   [facility]- normal  . ELECTROCARDIOGRAM  11/07/2006  . ESOPHAGOGASTRODUODENOSCOPY  08/20/2004  . REPLACEMENT TOTAL KNEE Right   . UPPER GASTROINTESTINAL ENDOSCOPY      Social History   Social History  . Marital status: Married    Spouse name: N/A  . Number of children: N/A  . 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 Smoking status: Never Smoker  . Smokeless tobacco: Never Used  . Alcohol use No  . Drug use: No  . Sexual activity: Not on file   Other Topics Concern  . Not on file   Social History Narrative  . No narrative on file    Current Outpatient Prescriptions on File Prior to Visit  Medication Sig Dispense Refill  . aspirin 81 MG tablet Take 81 mg by mouth daily.      . clotrimazole-betamethasone (LOTRISONE) cream Apply 3x a day as needed for rash 30 g 0  . fish oil-omega-3 fatty acids 1000 MG capsule Take 3 capsules by mouth daily.      Marland Kitchen gabapentin (NEURONTIN) 300 MG capsule TAKE  2 CAPSULES BY MOUTH 3  TIMES A DAY 540 capsule 3  . glucose blood (ONE TOUCH ULTRA TEST) test strip Use as instructed check blood sugar two times daily     . omeprazole (PRILOSEC) 20 MG capsule TAKE 1 CAPSULE BY MOUTH ONCE DAILY 90 capsule 0  . Potassium Chloride ER 20 MEQ TBCR TAKE 1 TABLET BY MOUTH  DAILY 90 tablet 3  . simvastatin (ZOCOR) 40 MG tablet TAKE 1 TABLET BY MOUTH AT  BEDTIME 90 tablet 2  . traZODone (DESYREL) 50 MG tablet TAKE 1 TABLET BY MOUTH AT  BEDTIME 90 tablet 3  . VICTOZA 18 MG/3ML SOPN INJECT 1.8MG  UNDER THE SKIN DAILY 3 pen 11   No current facility-administered medications on file prior to visit.     Allergies  Allergen Reactions  . Pioglitazone     REACTION: edema    Family History  Problem Relation Age of Onset  . Cancer Mother        Colon Cancer  . Colon cancer Mother   . Colon polyps Brother   . Esophageal cancer Neg Hx   . Rectal cancer Neg Hx   . Stomach cancer Neg Hx     BP 138/82   Pulse 93   Wt 290 lb 6.4 oz (131.7 kg)   SpO2 93%  BMI 39.39 kg/m    Review of Systems Denies fever, fatigue, visual loss, hearing loss, chest pain, sob, back pain, depression, cold intolerance, BRBPR, hematuria, syncope, numbness, allergy sxs, easy bruising, and rash.       Objective:   Physical Exam VS: see vs page GEN: no distress HEAD: head: no deformity eyes: no periorbital swelling, no proptosis external nose and ears are normal mouth: no lesion seen NECK: supple, thyroid is not enlarged CHEST WALL: no deformity LUNGS: clear to auscultation BREASTS:  No gynecomastia CV: reg rate and rhythm, no murmur ABD: abdomen is soft, nontender.  no hepatosplenomegaly.  not distended.  no hernia.  RECTAL: normal external and internal exam.  heme neg. PROSTATE:  Normal size.  No nodule.  MUSCULOSKELETAL: muscle bulk and strength are grossly normal.  no obvious joint swelling.  gait is normal and steady EXTEMITIES: no deformity.  no ulcer on the feet.  feet  are of normal color and temp.  1+ left ankle edema (trace on the right) PULSES: dorsalis pedis intact bilat.  no carotid bruit NEURO:  cn 2-12 grossly intact.   readily moves all 4's.  sensation is intact to touch on the feet SKIN:  Normal texture and temperature.  No rash or suspicious lesion is visible.   NODES:  None palpable at the neck PSYCH: alert, well-oriented.  Does not appear anxious nor depressed.   I personally reviewed electrocardiogram tracing (today): Indication: DM Impression: NSR.  No MI.  No hypertrophy. Compared to 2017: no significant change      Assessment & Plan:  Wellness visit today, with problems stable, except as noted.   SEPARATE EVALUATION FOLLOWS--EACH PROBLEM HERE IS NEW, NOT RESPONDING TO TREATMENT, OR POSES SIGNIFICANT RISK TO THE PATIENT'S HEALTH: HISTORY OF THE PRESENT ILLNESS: Pt returns for f/u of diabetes mellitus: DM type: 2 Dx'ed: 1751 Complications: renal insufficiency Therapy: victoza + 2 oral meds DKA: never.   Severe hypoglycemia: never.   Pancreatitis: never.   Other: he has never been on insulin; he did not tolerate invokana or parlodel (tremor); he cannot take pioglitizone (edema); he works 3rd shift.   Interval history: he says he gets "jittery" after taking glimepiride.  He has not checked cbg then.  He says he never misses meds.   PAST MEDICAL HISTORY:  Past Medical History:  Diagnosis Date  . ALCOHOLISM 12/22/2007  . ANEMIA, IRON DEFICIENCY 12/26/2007  . Arthritis   . Blood transfusion without reported diagnosis    as a child  . DIABETES MELLITUS, TYPE II 11/10/2006  . ESOPHAGITIS, REFLUX 12/22/2007  . GERD (gastroesophageal reflux disease)   . HYPERCHOLESTEROLEMIA 12/22/2007  . HYPERTENSION 11/10/2006  . HYPOGONADISM 12/22/2007   Prob. due to ETOH  . HYPOKALEMIA 06/06/2008  . Leukopenia   . Neuromuscular disorder ([place])   . Neuropathy    Chronic Neuropathy leg pain  . Pancytopenia 06/06/2008  . RENAL INSUFFICIENCY 11/10/2006      Past Surgical History:  Procedure Laterality Date  . APPENDECTOMY    . COLONOSCOPY  08-21-2004   [facility]- normal  . ELECTROCARDIOGRAM  11/07/2006  . ESOPHAGOGASTRODUODENOSCOPY  08/20/2004  . REPLACEMENT TOTAL KNEE Right   . UPPER GASTROINTESTINAL ENDOSCOPY      Social History   Social History  . Marital status: Married    Spouse name: N/A  . Number of children: N/A  . 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 Smoking  status: Never Smoker  . Smokeless tobacco: Never Used  . Alcohol use No  . Drug use: No  . Sexual activity: Not on file   Other Topics Concern  . Not on file   Social History Narrative  . No narrative on file    Current Outpatient Prescriptions on File Prior to Visit  Medication Sig Dispense Refill  . aspirin 81 MG tablet Take 81 mg by mouth daily.      . clotrimazole-betamethasone (LOTRISONE) cream Apply 3x a day as needed for rash 30 g 0  . fish oil-omega-3 fatty acids 1000 MG capsule Take 3 capsules by mouth daily.      Marland Kitchen gabapentin (NEURONTIN) 300 MG capsule TAKE 2 CAPSULES BY MOUTH 3  TIMES A DAY 540 capsule 3  . glucose blood (ONE TOUCH ULTRA TEST) test strip Use as instructed check blood sugar two times daily     . omeprazole (PRILOSEC) 20 MG capsule TAKE 1 CAPSULE BY MOUTH ONCE DAILY 90 capsule 0  . Potassium Chloride ER 20 MEQ TBCR TAKE 1 TABLET BY MOUTH  DAILY 90 tablet 3  . simvastatin (ZOCOR) 40 MG tablet TAKE 1 TABLET BY MOUTH AT  BEDTIME 90 tablet 2  . traZODone (DESYREL) 50 MG tablet TAKE 1 TABLET BY MOUTH AT  BEDTIME 90 tablet 3  . VICTOZA 18 MG/3ML SOPN INJECT 1.8MG  UNDER THE SKIN DAILY 3 pen 11   No current facility-administered medications on file prior to visit.     Allergies  Allergen Reactions  . Pioglitazone     REACTION: edema    Family History  Problem Relation Age of Onset  . Cancer Mother        Colon Cancer  . Colon cancer Mother   . Colon polyps  Brother   . Esophageal cancer Neg Hx   . Rectal cancer Neg Hx   . Stomach cancer Neg Hx     BP 138/82   Pulse 93   Wt 290 lb 6.4 oz (131.7 kg)   SpO2 93%   BMI 39.39 kg/m   REVIEW OF SYSTEMS: He denies hypoglycemia PHYSICAL EXAMINATION: VITAL SIGNS:  See vs page GENERAL: no distress Pulses: foot pulses are intact bilaterally.   MSK: no deformity of the feet or ankles.  CV: 2+ left edema of the left leg, and 1+ on the right Skin:  no ulcer on the feet or ankles.  normal color and temp on the feet and ankles Neuro: sensation is intact to touch on the feet and ankles.   Ext: There is bilateral onychomycosis of the toenails.   LAB/XRAY RESULTS: Lab Results  Component Value Date   HGBA1C 9.2 01/13/2017   Lab Results  Component Value Date   CREATININE 1.96 (H) 01/13/2017   BUN 19 01/13/2017   NA 134 (L) 01/13/2017   K 4.4 01/13/2017   CL 99 01/13/2017   CO2 26 01/13/2017    IMPRESSION: type 2 DM: he needs increased rx Hypokalemia: overreplaced HTN: slightly overcontrolled Renal insuff: he needs to reduce glimepiride.  PLAN:  Please reduce the lisinopril and glimepiride each to 1/2 pill per day. I have sent a prescription to your pharmacy, for an additional diabetes pill D/c KCL

## 2017-01-14 ENCOUNTER — Telehealth: Payer: Self-pay | Admitting: Endocrinology

## 2017-01-14 NOTE — Telephone Encounter (Signed)
Patient's medication canagliflozin (INVOKANA) 100 MG TABS tablet went to the incorrect pharmacy. Patient needs the medication to be sent to:  [place], [place] the one sent to the mail order.

## 2017-01-17 ENCOUNTER — Other Ambulatory Visit: Payer: Self-pay

## 2017-01-17 MED ORDER — CANAGLIFLOZIN 100 MG PO TABS: 100.0000 mg | ORAL_TABLET | Freq: Every day | ORAL | 11 refills | Status: DC

## 2017-01-19 ENCOUNTER — Other Ambulatory Visit: Payer: Self-pay

## 2017-01-19 MED ORDER — GLIMEPIRIDE 2 MG PO TABS: 2.0000 mg | ORAL_TABLET | Freq: Every day | ORAL | 3 refills | Status: DC

## 2017-01-19 MED ORDER — CANAGLIFLOZIN 100 MG PO TABS: 100.0000 mg | ORAL_TABLET | Freq: Every day | ORAL | 3 refills | Status: DC

## 2017-01-19 MED ORDER — LISINOPRIL-HYDROCHLOROTHIAZIDE 10-12.5 MG PO TABS: 0.5000 | ORAL_TABLET | Freq: Every day | ORAL | 3 refills | Status: DC

## 2017-01-31 ENCOUNTER — Other Ambulatory Visit: Payer: Self-pay | Admitting: Endocrinology

## 2017-02-22 ENCOUNTER — Ambulatory Visit: Payer: No Typology Code available for payment source | Admitting: Endocrinology

## 2017-02-24 ENCOUNTER — Ambulatory Visit: Payer: No Typology Code available for payment source | Admitting: Endocrinology

## 2017-02-24 ENCOUNTER — Encounter: Payer: Self-pay | Admitting: Endocrinology

## 2017-02-24 VITALS — BP 140/76 | HR 86 | Wt 295.6 lb

## 2017-02-24 DIAGNOSIS — M2550 Pain in unspecified joint: Secondary | ICD-10-CM

## 2017-02-24 DIAGNOSIS — E876 Hypokalemia: Secondary | ICD-10-CM

## 2017-02-24 DIAGNOSIS — N181 Chronic kidney disease, stage 1: Secondary | ICD-10-CM

## 2017-02-24 DIAGNOSIS — E1122 Type 2 diabetes mellitus with diabetic chronic kidney disease: Secondary | ICD-10-CM

## 2017-02-24 DIAGNOSIS — Z23 Encounter for immunization: Secondary | ICD-10-CM | POA: Diagnosis not present

## 2017-02-24 LAB — BASIC METABOLIC PANEL
BUN: 24 mg/dL — AB (ref 6–23)
CALCIUM: 9.8 mg/dL (ref 8.4–10.5)
CO2: 27 meq/L (ref 19–32)
Chloride: 100 mEq/L (ref 96–112)
Creatinine, Ser: 2.35 mg/dL — ABNORMAL HIGH (ref 0.40–1.50)
GFR: 36.19 mL/min — ABNORMAL LOW (ref >60.00)
GLUCOSE: 155 mg/dL — AB (ref 70–99)
Potassium: 4.1 mEq/L (ref 3.5–5.1)
Sodium: 135 mEq/L (ref 135–145)

## 2017-02-24 LAB — URIC ACID: Uric Acid, Serum: 5.7 mg/dL (ref 4.0–7.8)

## 2017-02-24 LAB — SEDIMENTATION RATE: Sed Rate: 20 mm/hr (ref 0–20)

## 2017-02-24 MED ORDER — REPAGLINIDE 2 MG PO TABS
2.0000 mg | ORAL_TABLET | Freq: Three times a day (TID) | ORAL | 3 refills | Status: DC
Start: 2017-02-24 — End: 2017-10-25

## 2017-02-24 MED ORDER — CANAGLIFLOZIN 100 MG PO TABS: 100.0000 mg | ORAL_TABLET | Freq: Every day | ORAL | 3 refills | Status: DC

## 2017-02-24 NOTE — Patient Instructions (Addendum)
I have sent a prescription to your pharmacy, to change glimepiride to repaglinide. Please come back for a follow-up appointment in 3 months.  Please see a specialist for the joint pain.  you will receive a phone call, about a day and time for an appointment blood tests are requested for you today.  We'll let you know about the results.  We may be able to reduce the potassium pill.

## 2017-02-24 NOTE — Progress Notes (Signed)
Subjective:    Patient ID: David Lindsey, male    DOB: 05-18-1955, 61 y.o.   MRN: [id number]  HPI Pt returns for f/u of diabetes mellitus: DM type: 2 Dx'ed: 7782 Complications: renal insufficiency Therapy: victoza + 3 oral meds DKA: never.   Severe hypoglycemia: never.   Pancreatitis: never.   Other: he has never been on insulin; he did not tolerate parlodel (tremor); he cannot take pioglitizone (edema); he works 3rd shift.   Interval history: he is off the [facility].  pt states he feels well in general, except for arthralgias.   Past Medical History:  Diagnosis Date  . ALCOHOLISM 12/22/2007  . ANEMIA, IRON DEFICIENCY 12/26/2007  . Arthritis   . Blood transfusion without reported diagnosis    as a child  . DIABETES MELLITUS, TYPE II 11/10/2006  . ESOPHAGITIS, REFLUX 12/22/2007  . GERD (gastroesophageal reflux disease)   . HYPERCHOLESTEROLEMIA 12/22/2007  . HYPERTENSION 11/10/2006  . HYPOGONADISM 12/22/2007   Prob. due to ETOH  . HYPOKALEMIA 06/06/2008  . Leukopenia   . Neuromuscular disorder ([place])   . Neuropathy    Chronic Neuropathy leg pain  . Pancytopenia 06/06/2008  . RENAL INSUFFICIENCY 11/10/2006    Past Surgical History:  Procedure Laterality Date  . APPENDECTOMY    . COLONOSCOPY  08-21-2004   [facility]- normal  . ELECTROCARDIOGRAM  11/07/2006  . ESOPHAGOGASTRODUODENOSCOPY  08/20/2004  . REPLACEMENT TOTAL KNEE Right   . UPPER GASTROINTESTINAL ENDOSCOPY      Social History   Socioeconomic History  . Marital status: Married    Spouse name: Not on file  . Number of children: Not on file  . Years of education: Not on file  . Highest education level: Not on file  Social Needs  . Financial resource strain: Not on file  . Food insecurity - worry: Not on file  . Food insecurity - inability: Not on file  . Transportation needs - medical: Not on file  . Transportation needs - non-medical: Not on file  Occupational History  . 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 Smoking status: Never Smoker  . Smokeless tobacco: Never Used  Substance and Sexual Activity  . Alcohol use: No    Alcohol/week: 0.0 oz  . Drug use: No  . Sexual activity: Not on file  Other Topics Concern  . Not on file  Social History Narrative  . Not on file    Current Outpatient Medications on File Prior to Visit  Medication Sig Dispense Refill  . aspirin 81 MG tablet Take 81 mg by mouth daily.      . clotrimazole-betamethasone (LOTRISONE) cream Apply 3x a day as needed for rash 30 g 0  . fish oil-omega-3 fatty acids 1000 MG capsule Take 3 capsules by mouth daily.      Marland Kitchen gabapentin (NEURONTIN) 300 MG capsule TAKE 2 CAPSULES BY MOUTH 3  TIMES A DAY 540 capsule 3  . glucose blood (ONE TOUCH ULTRA TEST) test strip Use as instructed check blood sugar two times daily     . lisinopril-hydrochlorothiazide (PRINZIDE,ZESTORETIC) 10-12.5 MG tablet Take 0.5 tablets by mouth daily. 45 tablet 3  . metFORMIN (GLUCOPHAGE-XR) 500 MG 24 hr tablet Take 1 tablet (500 mg total) by mouth daily. 90 tablet 3  . omeprazole (PRILOSEC) 20 MG capsule TAKE 1 CAPSULE BY MOUTH ONCE DAILY 90 capsule 0  . sildenafil (VIAGRA) 100 MG tablet TAKE ONE TABLET BY MOUTH ONCE DAILY AS NEEDED  6 tablet 29  . simvastatin (ZOCOR) 40 MG tablet TAKE 1 TABLET BY MOUTH AT  BEDTIME 90 tablet 2  . traZODone (DESYREL) 50 MG tablet TAKE 1 TABLET BY MOUTH AT  BEDTIME 90 tablet 3  . VICTOZA 18 MG/3ML SOPN INJECT 1.8MG  UNDER THE SKIN DAILY 3 pen 11   No current facility-administered medications on file prior to visit.     Allergies  Allergen Reactions  . Pioglitazone     REACTION: edema    Family History  Problem Relation Age of Onset  . Cancer Mother        Colon Cancer  . Colon cancer Mother   . Colon polyps Brother   . Esophageal cancer Neg Hx   . Rectal cancer Neg Hx   . Stomach cancer Neg Hx     BP 140/76 (BP Location: Left Arm, Patient Position: Sitting, Cuff Size: Normal)   Pulse 86    Wt 295 lb 9.6 oz (134.1 kg)   SpO2 96%   BMI 40.09 kg/m    Review of Systems He denies hypoglycemia.  Chronic bilat knee pain persists.    Objective:   Physical Exam VITAL SIGNS:  See vs page GENERAL: no distress Pulses: foot pulses are intact bilaterally.   MSK: no deformity of the feet or ankles.  CV: 2+ left edema of the left leg, and 1+ on the right Skin:  no ulcer on the feet or ankles.  normal color and temp on the feet and ankles Neuro: sensation is intact to touch on the feet and ankles.   Ext: There is bilateral onychomycosis of the toenails.        Assessment & Plan:  HTN: with prob situational component. Please continue the same medication for now.  Hypokalemia: due for recheck Type 2 DM: he needs increased rx, if it can be done with a regimen that avoids or minimizes hypoglycemia. Renal insuff: due for recheck. Knee pain, persistent.  Patient Instructions  I have sent a prescription to your pharmacy, to change glimepiride to repaglinide. Please come back for a follow-up appointment in 3 months.  Please see a specialist for the joint pain.  you will receive a phone call, about a day and time for an appointment blood tests are requested for you today.  We'll let you know about the results.  We may be able to reduce the potassium pill.

## 2017-02-25 ENCOUNTER — Encounter: Payer: Self-pay | Admitting: Endocrinology

## 2017-02-25 ENCOUNTER — Other Ambulatory Visit: Payer: Self-pay | Admitting: Endocrinology

## 2017-02-25 DIAGNOSIS — E7800 Pure hypercholesterolemia, unspecified: Secondary | ICD-10-CM

## 2017-02-25 DIAGNOSIS — D61818 Other pancytopenia: Secondary | ICD-10-CM

## 2017-02-25 DIAGNOSIS — Z125 Encounter for screening for malignant neoplasm of prostate: Secondary | ICD-10-CM

## 2017-02-25 DIAGNOSIS — N181 Chronic kidney disease, stage 1: Secondary | ICD-10-CM

## 2017-02-25 DIAGNOSIS — E291 Testicular hypofunction: Secondary | ICD-10-CM

## 2017-02-25 DIAGNOSIS — E1122 Type 2 diabetes mellitus with diabetic chronic kidney disease: Secondary | ICD-10-CM

## 2017-02-25 DIAGNOSIS — E876 Hypokalemia: Secondary | ICD-10-CM

## 2017-02-25 DIAGNOSIS — Z0000 Encounter for general adult medical examination without abnormal findings: Secondary | ICD-10-CM

## 2017-03-07 ENCOUNTER — Other Ambulatory Visit: Payer: Self-pay | Admitting: Endocrinology

## 2017-03-07 NOTE — Telephone Encounter (Signed)
Patient is wanting to see someone a specialist for his kidneys, please advise

## 2017-03-09 ENCOUNTER — Ambulatory Visit
Admission: RE | Admit: 2017-03-09 | Discharge: 2017-03-09 | Disposition: A | Discharge status: Home / Self Care | Payer: No Typology Code available for payment source | Source: Ambulatory Visit | Attending: Sports Medicine | Admitting: Sports Medicine

## 2017-03-09 ENCOUNTER — Ambulatory Visit (INDEPENDENT_AMBULATORY_CARE_PROVIDER_SITE_OTHER): Payer: No Typology Code available for payment source | Admitting: Family Medicine

## 2017-03-09 ENCOUNTER — Telehealth: Payer: Self-pay | Admitting: Family Medicine

## 2017-03-09 ENCOUNTER — Encounter: Payer: Self-pay | Admitting: Family Medicine

## 2017-03-09 VITALS — BP 137/83 | Ht 71.0 in | Wt 285.0 lb

## 2017-03-09 DIAGNOSIS — M25562 Pain in left knee: Principal | ICD-10-CM

## 2017-03-09 DIAGNOSIS — G8929 Other chronic pain: Secondary | ICD-10-CM

## 2017-03-09 MED ORDER — TRAMADOL HCL 50 MG PO TABS: 50.0000 mg | ORAL_TABLET | Freq: Two times a day (BID) | ORAL | 0 refills | Status: DC | PRN

## 2017-03-09 NOTE — Progress Notes (Signed)
Chief complaint: Chronic left knee pain 8-12 months  History of present illness: David Lindsey is a 61 year old male who presents to the sports medicine office today with chief complaint of left knee pain. He was kindly referred here by Dr. Loanne Drilling. He reports pain has been present chronically, but has acutely worsened over the last 8-12 months. He is not report of any specific inciting incident, trauma, or injury to explain the pain. He does work in the [employer], works 12 hour shift is on his feet for prolonged periods of time. He reports about 8 hours into his shift he starts to feel diffuse pain in his left knee, reports feeling an aching and throbbing pain in the inferior aspect of his left knee. He reports of occasional swelling, but no warmth, erythema, ecchymosis noted. He has also noticed diffuse left leg swelling. He did have his right knee replaced by Dr. Tamera Punt back in 2011. He feels that this pain is very similar to the pain that he had his right knee prior to replacement. He does not report of any numbness, tingling, or burning paresthesias. He does report a mechanical symptoms today to include popping, locking, catching, symptoms of giving way. He is not report of any previous injury to his left knee. He has not tried any medications for relief of symptoms. He does not report of pain waking him up at nighttime.  Review of systems:  As stated above  His past medical history, surgical history, family history, and social history obtained and reviewed. 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 His primary physician has recommended referral to nephrologist for management CKD. Surgical history as noted above. Does not report of any current tobacco use. Family history notable for colon cancer.  Physical exam: Vital signs are reviewed and are documented in the chart Gen.: Alert, oriented, appears stated age, in no apparent distress HEENT: Moist oral  mucosa Respiratory: Normal respirations, able to speak in full sentences Cardiac: Regular rate, distal pulses 2+, and his left lower extremity he does have 1+ pitting edema up to 1/2 way up left lower leg Integumentary: No rashes on visible skin:  Neurologic: Strength 5/5, sensation 2+ in bilateral lower extremities Psych: Normal affect, mood is described as good Musculoskeletal: Inspection of left knee reveals no obvious deformity or muscle atrophy, no warmth, erythema, ecchymosis, or effusion noted, no tenderness to palpation anywhere along the left knee today including quadriceps tendon, patellar tendon, medial joint line, lateral joint line, no fullness or masses and popliteal fossa, Lachman, anterior drawer, valgus, and varus stress testing negative, McMurray positive for crepitus, negative for pain, arc of range of motion is from 0 to 120, do feel crepitus along arc of range of motion, does have normal hip external and internal range of motion, no antalgic gait, right knee is noticed to have well healed vertical incisional scar from previous right knee replacement  Assessment and plan: 1. Chronic left knee pain, suspect from primary degenerative osteoarthritis, can't rule out any degenerative meniscal tear  Plan: Discussed with David Lindsey today that his symptoms seem consistent with primary degenerative osteoarthritis of the left knee. Discussed various treatment options with him today to include oral medications, physical therapy for strengthening of the quadriceps, hamstring, and calf muscles, and cortisone injection to the left knee. After shared decision-making, he would like to hold off on cortisone injection today and just try oral medications. 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 Discussed short trial of tramadol, 50 mg twice daily as needed for pain, discussed that he can also use Tylenol as needed for pain. Discussed I would like to get x-rays of his  left knee as he has no previous x-rays of his left knee. Will order for 4 views of the left knee to include standing AP, lateral, sunrise, and Rosenberg. We'll call him after x-ray results. I discussed if he does not have any interval improvement in symptoms in the next 3-4 weeks to return to office and consider cortisone injection at that time. Otherwise, he will return sooner as needed.   Mort Sawyers, M.D. [place]

## 2017-03-09 NOTE — Telephone Encounter (Signed)
Tried Building control surveyor at 1635 this afternoon to discuss XR results of left knee. He does have tricompartmental OA, nearly bone on bone in medial compartment. No changes in management right now. Option of cortisone injection in interim as well. If no improvement with conservative measures and injection, then MRI is warranted.  Mort Sawyers, MD Primary Care Sports Medicine Fellow [facility]

## 2017-03-10 NOTE — Telephone Encounter (Signed)
Patient is requesting a call from the the office.  Called [id number]

## 2017-03-10 NOTE — Telephone Encounter (Signed)
Patient is calling on the status of the referral to a kidney specialist, please advise

## 2017-03-11 NOTE — Telephone Encounter (Signed)
Pt called to check status of referral to kidney specialist. Per notes in lab result Loanne Drilling states will refer to kidney specialist. Please advise pt.

## 2017-03-15 ENCOUNTER — Ambulatory Visit: Payer: No Typology Code available for payment source | Admitting: Endocrinology

## 2017-03-22 ENCOUNTER — Encounter: Payer: Self-pay | Admitting: Endocrinology

## 2017-03-22 ENCOUNTER — Other Ambulatory Visit: Payer: Self-pay | Admitting: Endocrinology

## 2017-03-23 ENCOUNTER — Telehealth: Payer: Self-pay | Admitting: Endocrinology

## 2017-03-23 DIAGNOSIS — N181 Chronic kidney disease, stage 1: Secondary | ICD-10-CM

## 2017-03-23 DIAGNOSIS — E1122 Type 2 diabetes mellitus with diabetic chronic kidney disease: Secondary | ICD-10-CM

## 2017-03-23 NOTE — Telephone Encounter (Signed)
I put in referral for kidney dr. As for rheumatology, ref was put in already.

## 2017-03-23 NOTE — Telephone Encounter (Signed)
I left patient VM stating that referral was placed.

## 2017-03-23 NOTE — Telephone Encounter (Signed)
Patient is waiting for referral to a Kidney Dr-per Dr. Loanne Drilling at patient"s last visit due to lab results-please call patient at [phone]

## 2017-03-25 ENCOUNTER — Ambulatory Visit (INDEPENDENT_AMBULATORY_CARE_PROVIDER_SITE_OTHER): Payer: No Typology Code available for payment source | Admitting: Family Medicine

## 2017-03-25 ENCOUNTER — Encounter: Payer: Self-pay | Admitting: Family Medicine

## 2017-03-25 VITALS — BP 132/83 | Ht 71.0 in | Wt 285.0 lb

## 2017-03-25 DIAGNOSIS — Z96651 Presence of right artificial knee joint: Secondary | ICD-10-CM

## 2017-03-25 DIAGNOSIS — R609 Edema, unspecified: Secondary | ICD-10-CM

## 2017-03-25 DIAGNOSIS — M1712 Unilateral primary osteoarthritis, left knee: Secondary | ICD-10-CM

## 2017-03-25 DIAGNOSIS — L989 Disorder of the skin and subcutaneous tissue, unspecified: Secondary | ICD-10-CM

## 2017-03-25 MED ORDER — METHYLPREDNISOLONE ACETATE 40 MG/ML IJ SUSP
40.0000 mg | Freq: Once | INTRAMUSCULAR | Status: AC
  Administered 2017-03-25: 40 mg via INTRA_ARTICULAR

## 2017-03-25 NOTE — Progress Notes (Signed)
Subjective:    Patient ID: David Lindsey, male    DOB: 27-Oct-1955, 61 y.o.   MRN: [id number]  HPI David Lindsey is a pleasant 61 yo AAM who is presenting for 3 week follow up for his chronic left knee pain. At his last visit with Dr. Raynelle Bring he received Tramadol (avoiding NSAIDS due to kidney function) which has not offered him any relief. He works on his feet for most of the day which aggravates his pain. He has received injections of his right knee and eventually got a TKR of that knee. Today he is looking for an injection of his left knee. He has never had an injection of that knee before. Pain is a constant 3-6/10 that worsens with long periods of standing. It is mostly located on the medial side of the knee but will occasionally extend down the anterior shin towards the ankle. Denies any pallor or paresthesias. Diabetes has good control with a reported HA1c of 8.0.   ROS: Pertinent review of systems: negative for fever or unusual weight change. He has had some left lower extremity swelling for the last several months.  Notably he mentioned this to his PCP who thought it might be coming from his knee.  He is not tried any support hose.  The swelling does go down overnight.  He also has a skin lesion on the left foot that has not increased in size over the last month.  It does not itch.    Objective:   Physical Exam  Constitutional: He is oriented to person, place, and time. He appears well-developed and well-nourished. No distress.  HENT:  Head: Normocephalic and atraumatic.  Eyes: Conjunctivae and EOM are normal. Pupils are equal, round, and reactive to light. Right eye exhibits no discharge. Left eye exhibits no discharge.  Neck: Normal range of motion. Neck supple. No tracheal deviation present. No thyromegaly present.  Cardiovascular: Normal rate.  Pulmonary/Chest: Effort normal. No stridor.  Musculoskeletal: He exhibits edema (left foot and lower extremity).  Neurological: He is alert and  oriented to person, place, and time.  Skin: Skin is warm. Rash (chronic red raised rash on left foot over first MT joint.) noted. He is not diaphoretic.  Psychiatric: He has a normal mood and affect. His behavior is normal.  Vitals reviewed.  MSK: Left knee: mild medial joint TTP noted. No skin changes.  No effusion.  Full ROM. Knee flexion and extension strength 5/5.  Ligamentously intact to varus and valgus stress.  Calf is soft.  Popliteal space is benign. Right knee: Well-healed midline scar.         Assessment & Plan:  #Chronic Left Knee Pain #Moderate Left knee OA  Corticosteroid injection today. #Left Lower Extremity swelling Unclear if this is residual effect from knee OA.  It is asymmetrical.  It is mild so I am not worried about proximal clot per se.  Could be related to some mild heart failure.-Recommended OTC compression stockings to aid with swelling and have given him website to purchase some moderate compression comfortable socks.  If he does not have improvement with the lower extremity edema with combination of the corticosteroid injection and the compression sock, I think he needs to follow-up with his PCP regarding this.   PROCEDURE: INJECTION: Patient was given informed consent, signed copy in the chart. Appropriate time out was taken. Area prepped and draped in usual sterile fashion. Ethyl chloride was  used for local anesthesia. A 21 gauge 1 1/2 inch needle  was used.. 1 cc of methylprednisolone 40 mg/ml plus 4 cc of 1% lidocaine without epinephrine was injected into the left knee joint using a(n) anterior medial approach.   The patient tolerated the procedure well. There were no complications. Post procedure instructions were given.   #Skin lesion Worsening over the last two months. May be fungal, will defer to PCP for possible punch biopsy or treatment.  Does not look like diabetic foot ulcer.  It is more plaque like. -Follow up with Dr. Loanne Drilling

## 2017-03-25 NOTE — Patient Instructions (Signed)
Today you received an injection with corticosteroid. This injection is usually done in response to pain and inflammation. There is some "numbing" medicine also in the shot so the injected area may be numb and feel really good for the next couple of hours. The numbing medicine usually wears off in 2-3 hours though, and then your pain level will be right back where it was before the injection.   The actually benefit from the steroid injection is usually noticed in 2-7 days. You may actually experience a small (as in 10%) INCREASE in pain in the first 24 hours---that is common.   Things to watch out for that you should contact us or a health care provider urgently would include: 1. Unusual (as in more than 10%) increase in pain 2. New fever > 101.5 3. New swelling or redness of the injected area.  4. Streaking of red lines around the area injected.  Great to see you!  [email or web address] is a place in [place] that sells pretty good compression socks. They are sold by shoe size.  See your regular doctor if the swelling gets worse and see him about hthe skin lesion on yoru foot. Have a Happy Holiday!

## 2017-03-25 NOTE — Progress Notes (Signed)
[facility]: Attending Note: I have reviewed the chart, discussed wit the Sports Medicine Fellow. I agree with assessment and treatment plan as detailed in the [facility] note. He would like to delay getting a knee replacement on the left as long as possible and I agree with this plan.  We will try corticosteroid injection today and see how much relief he gets.  We discussed chronic injection therapy at length.  I reviewed his knee x-rays of the left knee from 1128, 2018.  Medial joint space loss.  Osteophyte formation.  Mild sclerosis.   2.  Mild lower extremity swelling on the left.  It is asymmetrical.  I agree with the resident's plan.  If it does not improve or if it worsens, he needs to follow-up with PCP. 3.  Skin lesion: This is plaque-like, has enlarged over the last month or so so I think he needs to follow-up with PCP.  Might need punch biopsy.  Might be fungal.  There is currently no skin breakdown.  I would describe it as a 1.5 x 1.5 square plaque lesion dorsum of the left foot at the base of the first and second metatarsal.  Slightly hyperpigmented.

## 2017-03-29 LAB — HM DIABETES EYE EXAM

## 2017-04-07 ENCOUNTER — Other Ambulatory Visit: Payer: Self-pay | Admitting: Endocrinology

## 2017-05-27 ENCOUNTER — Encounter: Payer: Self-pay | Admitting: Endocrinology

## 2017-05-27 ENCOUNTER — Ambulatory Visit: Payer: No Typology Code available for payment source | Admitting: Endocrinology

## 2017-05-27 VITALS — BP 142/90 | HR 100 | Wt 285.4 lb

## 2017-05-27 DIAGNOSIS — M79605 Pain in left leg: Secondary | ICD-10-CM | POA: Diagnosis not present

## 2017-05-27 DIAGNOSIS — E1122 Type 2 diabetes mellitus with diabetic chronic kidney disease: Secondary | ICD-10-CM

## 2017-05-27 DIAGNOSIS — M79604 Pain in right leg: Secondary | ICD-10-CM | POA: Diagnosis not present

## 2017-05-27 DIAGNOSIS — N181 Chronic kidney disease, stage 1: Secondary | ICD-10-CM | POA: Diagnosis not present

## 2017-05-27 LAB — POCT GLYCOSYLATED HEMOGLOBIN (HGB A1C): Hemoglobin A1C: 7.8

## 2017-05-27 MED ORDER — METFORMIN HCL ER 500 MG PO TB24: 500.0000 mg | ORAL_TABLET | Freq: Every day | ORAL | 3 refills | Status: DC

## 2017-05-27 MED ORDER — METOPROLOL SUCCINATE ER 25 MG PO TB24: 12.5000 mg | ORAL_TABLET | Freq: Every day | ORAL | 3 refills | Status: DC

## 2017-05-27 MED ORDER — ACARBOSE 25 MG PO TABS: 25.0000 mg | ORAL_TABLET | Freq: Three times a day (TID) | ORAL | 1 refills | Status: DC

## 2017-05-27 NOTE — Patient Instructions (Addendum)
I have sent 2 prescription to your pharmacy, to add "acarbose." check your blood sugar once a day.  vary the time of day when you check, between before the 3 meals, and at bedtime.  also check if you have symptoms of your blood sugar being too high or too low.  please keep a record of the readings and bring it to your next appointment here (or you can bring the meter itself).  You can write it on any piece of paper.  please call us sooner if your blood sugar goes below 70, or if you have a lot of readings over 200. Let's check a circulation test.  you will receive a phone call, about a day and time for an appointment. Please come back for a follow-up appointment in 3 months.

## 2017-05-27 NOTE — Progress Notes (Signed)
Subjective:    Patient ID: David Lindsey, male    DOB: [birth year], 62 y.o.   MRN: [id number]  HPI Pt returns for f/u of diabetes mellitus: DM type: 2 Dx'ed: 6295 Complications: renal insufficiency Therapy: victoza + 3 oral meds DKA: never.   Severe hypoglycemia: never.   Pancreatitis: never.   Other: he has never been on insulin; he did not tolerate parlodel (tremor); he cannot take pioglitizone (edema); he works 3rd shift.  Interval history: pt states he feels well in general, except for arthralgias of the LE's.  no cbg record, but states cbg's are well-controlled.   Past Medical History:  Diagnosis Date  . ALCOHOLISM 12/22/2007  . ANEMIA, IRON DEFICIENCY 12/26/2007  . Arthritis   . Blood transfusion without reported diagnosis    as a child  . DIABETES MELLITUS, TYPE II 11/10/2006  . ESOPHAGITIS, REFLUX 12/22/2007  . GERD (gastroesophageal reflux disease)   . HYPERCHOLESTEROLEMIA 12/22/2007  . HYPERTENSION 11/10/2006  . HYPOGONADISM 12/22/2007   Prob. due to ETOH  . HYPOKALEMIA 06/06/2008  . Leukopenia   . Neuromuscular disorder ([place])   . Neuropathy    Chronic Neuropathy leg pain  . Pancytopenia 06/06/2008  . RENAL INSUFFICIENCY 11/10/2006    Past Surgical History:  Procedure Laterality Date  . APPENDECTOMY    . COLONOSCOPY  08-21-2004   [facility]- normal  . ELECTROCARDIOGRAM  11/07/2006  . ESOPHAGOGASTRODUODENOSCOPY  08/20/2004  . REPLACEMENT TOTAL KNEE Right   . UPPER GASTROINTESTINAL ENDOSCOPY      Social History   Socioeconomic History  . Marital status: Married    Spouse name: Not on file  . Number of children: Not on file  . Years of education: Not on file  . Highest education level: Not on file  Social Needs  . Financial resource strain: Not on file  . Food insecurity - worry: Not on file  . Food insecurity - inability: Not on file  . Transportation needs - medical: Not on file  . Transportation needs - non-medical: Not on file  Occupational History  .  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 Smoking status: Never Smoker  . Smokeless tobacco: Never Used  Substance and Sexual Activity  . Alcohol use: No    Alcohol/week: 0.0 oz  . Drug use: No  . Sexual activity: Not on file  Other Topics Concern  . Not on file  Social History Narrative  . Not on file    Current Outpatient Medications on File Prior to Visit  Medication Sig Dispense Refill  . aspirin 81 MG tablet Take 81 mg by mouth daily.      . canagliflozin (INVOKANA) 100 MG TABS tablet Take 1 tablet (100 mg total) daily before breakfast by mouth. 90 tablet 3  . clotrimazole-betamethasone (LOTRISONE) cream Apply 3x a day as needed for rash 30 g 0  . fish oil-omega-3 fatty acids 1000 MG capsule Take 3 capsules by mouth daily.      Marland Kitchen gabapentin (NEURONTIN) 300 MG capsule TAKE 2 CAPSULES BY MOUTH 3  TIMES A DAY 540 capsule 3  . glucose blood (ONE TOUCH ULTRA TEST) test strip Use as instructed check blood sugar two times daily     . lisinopril-hydrochlorothiazide (PRINZIDE,ZESTORETIC) 10-12.5 MG tablet Take 0.5 tablets by mouth daily. 45 tablet 3  . omeprazole (PRILOSEC) 20 MG capsule TAKE 1 CAPSULE BY MOUTH ONCE DAILY 90 capsule 0  . repaglinide (PRANDIN) 2 MG tablet Take 1 tablet (  2 mg total) 3 (three) times daily before meals by mouth. 90 tablet 3  . sildenafil (VIAGRA) 100 MG tablet TAKE ONE TABLET BY MOUTH ONCE DAILY AS NEEDED 6 tablet 29  . simvastatin (ZOCOR) 40 MG tablet TAKE 1 TABLET BY MOUTH AT  BEDTIME 90 tablet 2  . traZODone (DESYREL) 50 MG tablet TAKE 1 TABLET BY MOUTH AT  BEDTIME 90 tablet 3  . VICTOZA 18 MG/3ML SOPN INJECT 1.8MG  UNDER THE SKIN DAILY 3 pen 11   No current facility-administered medications on file prior to visit.     Allergies  Allergen Reactions  . Pioglitazone     REACTION: edema    Family History  Problem Relation Age of Onset  . Cancer Mother        Colon Cancer  . Colon cancer Mother   . Colon polyps Brother     . Esophageal cancer Neg Hx   . Rectal cancer Neg Hx   . Stomach cancer Neg Hx     BP (!) 142/90 (BP Location: Left Arm, Patient Position: Sitting, Cuff Size: Normal)   Pulse 100   Wt 285 lb 6.4 oz (129.5 kg)   SpO2 94%   BMI 39.81 kg/m    Review of Systems He denies hypoglycemia.  He has calf pain with ambulation.      Objective:   Physical Exam VITAL SIGNS:  See vs page GENERAL: no distress Pulses: foot pulses are intact bilaterally.   MSK: no deformity of the feet or ankles.  CV: 1+ bilat leg edema.  Skin:  no ulcer on the feet or ankles.  normal color and temp on the feet and ankles.  Neuro: sensation is intact to touch on the feet and ankles.   Ext: There is bilateral onychomycosis of the toenails.   Lab Results  Component Value Date   HGBA1C 7.8 05/27/2017   Lab Results  Component Value Date   CREATININE 2.35 (H) 02/24/2017   BUN 24 (H) 02/24/2017   NA 135 02/24/2017   K 4.1 02/24/2017   CL 100 02/24/2017   CO2 27 02/24/2017       Assessment & Plan:  Type 2 DM: he needs increased rx.   Renal insuff: this limits rx options.    Calf pain, new, poss claudication.     Patient Instructions  I have sent 2 prescription to your pharmacy, to add "acarbose." check your blood sugar once a day.  vary the time of day when you check, between before the 3 meals, and at bedtime.  also check if you have symptoms of your blood sugar being too high or too low.  please keep a record of the readings and bring it to your next appointment here (or you can bring the meter itself).  You can write it on any piece of paper.  please call us sooner if your blood sugar goes below 70, or if you have a lot of readings over 200. Let's check a circulation test.  you will receive a phone call, about a day and time for an appointment. Please come back for a follow-up appointment in 3 months.

## 2017-06-11 ENCOUNTER — Other Ambulatory Visit: Payer: Self-pay | Admitting: Endocrinology

## 2017-07-25 ENCOUNTER — Other Ambulatory Visit: Payer: Self-pay | Admitting: Nephrology

## 2017-07-25 DIAGNOSIS — I129 Hypertensive chronic kidney disease with stage 1 through stage 4 chronic kidney disease, or unspecified chronic kidney disease: Secondary | ICD-10-CM

## 2017-07-25 DIAGNOSIS — N183 Chronic kidney disease, stage 3 unspecified: Secondary | ICD-10-CM

## 2017-07-28 ENCOUNTER — Ambulatory Visit
Admission: RE | Admit: 2017-07-28 | Discharge: 2017-07-28 | Disposition: A | Discharge status: Home / Self Care | Payer: No Typology Code available for payment source | Source: Ambulatory Visit | Attending: Nephrology | Admitting: Nephrology

## 2017-07-28 DIAGNOSIS — N183 Chronic kidney disease, stage 3 unspecified: Secondary | ICD-10-CM

## 2017-07-28 DIAGNOSIS — I129 Hypertensive chronic kidney disease with stage 1 through stage 4 chronic kidney disease, or unspecified chronic kidney disease: Secondary | ICD-10-CM

## 2017-08-24 ENCOUNTER — Ambulatory Visit: Payer: No Typology Code available for payment source | Admitting: Endocrinology

## 2017-08-29 ENCOUNTER — Ambulatory Visit: Payer: Self-pay | Admitting: Urgent Care

## 2017-08-29 ENCOUNTER — Encounter: Payer: Self-pay | Admitting: Endocrinology

## 2017-08-29 ENCOUNTER — Ambulatory Visit (INDEPENDENT_AMBULATORY_CARE_PROVIDER_SITE_OTHER): Payer: No Typology Code available for payment source | Admitting: Endocrinology

## 2017-08-29 VITALS — BP 150/88 | HR 94 | Wt 273.4 lb

## 2017-08-29 DIAGNOSIS — M25512 Pain in left shoulder: Secondary | ICD-10-CM | POA: Diagnosis not present

## 2017-08-29 DIAGNOSIS — N181 Chronic kidney disease, stage 1: Secondary | ICD-10-CM | POA: Diagnosis not present

## 2017-08-29 DIAGNOSIS — E1122 Type 2 diabetes mellitus with diabetic chronic kidney disease: Secondary | ICD-10-CM

## 2017-08-29 DIAGNOSIS — G8929 Other chronic pain: Secondary | ICD-10-CM

## 2017-08-29 LAB — POCT GLYCOSYLATED HEMOGLOBIN (HGB A1C): HEMOGLOBIN A1C: 9.7 % — AB (ref 4.0–5.6)

## 2017-08-29 MED ORDER — ROSUVASTATIN CALCIUM 5 MG PO TABS: 5.0000 mg | ORAL_TABLET | Freq: Every day | ORAL | 3 refills | Status: DC

## 2017-08-29 MED ORDER — CEFUROXIME AXETIL 250 MG PO TABS: 250.0000 mg | ORAL_TABLET | Freq: Two times a day (BID) | ORAL | 0 refills | Status: AC

## 2017-08-29 NOTE — Patient Instructions (Addendum)
I have sent a prescription to your pharmacy, for an antibiotic pill. Loratadine-d (non-prescription) will help your congestion. Also, you should take antihistamine eye drops.   I hope you feel better soon.  If you don't feel better by next week, please call back.  Please call sooner if you get worse.   Please continue the same medications for diabetes. I have sent a prescription to your pharmacy, to change to a different cholesterol pill. blood tests are requested for you today.  We'll let you know about the results. Please call if you want to see Dr Tamala Julian for you shoulder.  Please come back for a follow-up appointment in 2 months.

## 2017-08-29 NOTE — Progress Notes (Signed)
Subjective:    Patient ID: David Lindsey, male    DOB: 08-22-1955, 62 y.o.   MRN: [id number]  HPI  Pt returns for f/u of diabetes mellitus: DM type: 2 Dx'ed: 4235 Complications: renal insufficiency Therapy: victoza + 3 oral meds DKA: never.   Severe hypoglycemia: never.   Pancreatitis: never.   Other: he has never been on insulin; he did not tolerate parlodel (tremor); he cannot take pioglitizone (edema); he works 3rd shift.  Interval history: no cbg record, but states cbg's have increased to the high-100's.  4 days of moderate prod-quality cough in the chest, but no assoc sob.   Past Medical History:  Diagnosis Date  . ALCOHOLISM 12/22/2007  . ANEMIA, IRON DEFICIENCY 12/26/2007  . Arthritis   . Blood transfusion without reported diagnosis    as a child  . DIABETES MELLITUS, TYPE II 11/10/2006  . ESOPHAGITIS, REFLUX 12/22/2007  . GERD (gastroesophageal reflux disease)   . HYPERCHOLESTEROLEMIA 12/22/2007  . HYPERTENSION 11/10/2006  . HYPOGONADISM 12/22/2007   Prob. due to ETOH  . HYPOKALEMIA 06/06/2008  . Leukopenia   . Neuromuscular disorder ([place])   . Neuropathy    Chronic Neuropathy leg pain  . Pancytopenia 06/06/2008  . RENAL INSUFFICIENCY 11/10/2006    Past Surgical History:  Procedure Laterality Date  . APPENDECTOMY    . COLONOSCOPY  08-21-2004   [facility]- normal  . ELECTROCARDIOGRAM  11/07/2006  . ESOPHAGOGASTRODUODENOSCOPY  08/20/2004  . REPLACEMENT TOTAL KNEE Right   . UPPER GASTROINTESTINAL ENDOSCOPY      Social History   Socioeconomic History  . Marital status: Married    Spouse name: Not on file  . Number of children: Not on file  . Years of education: Not on file  . Highest education level: Not on file  Occupational History  . 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 Financial resource strain: Not on file  . Food insecurity:    Worry: Not on file    Inability: Not on file  . Transportation needs:    Medical: Not on file   Non-medical: Not on file  Tobacco Use  . Smoking status: Never Smoker  . Smokeless tobacco: Never Used  Substance and Sexual Activity  . Alcohol use: No    Alcohol/week: 0.0 oz  . Drug use: No  . Sexual activity: Not on file  Lifestyle  . Physical activity:    Days per week: Not on file    Minutes per session: Not on file  . Stress: Not on file  Relationships  . Social connections:    Talks on phone: Not on file    Gets together: Not on file    Attends religious service: Not on file    Active member of club or organization: Not on file    Attends meetings of clubs or organizations: Not on file    Relationship status: Not on file  . Intimate partner violence:    Fear of current or ex partner: Not on file    Emotionally abused: Not on file    Physically abused: Not on file    Forced sexual activity: Not on file  Other Topics Concern  . Not on file  Social History Narrative  . Not on file    Current Outpatient Medications on File Prior to Visit  Medication Sig Dispense Refill  . acarbose (PRECOSE) 25 MG tablet Take 1 tablet (25 mg total) by mouth 3 (three) times daily with  meals. 90 tablet 1  . aspirin 81 MG tablet Take 81 mg by mouth daily.      . canagliflozin (INVOKANA) 100 MG TABS tablet Take 1 tablet (100 mg total) daily before breakfast by mouth. 90 tablet 3  . clotrimazole-betamethasone (LOTRISONE) cream Apply 3x a day as needed for rash 30 g 0  . fish oil-omega-3 fatty acids 1000 MG capsule Take 3 capsules by mouth daily.      Marland Kitchen gabapentin (NEURONTIN) 300 MG capsule TAKE 2 CAPSULES BY MOUTH 3  TIMES A DAY 540 capsule 3  . glucose blood (ONE TOUCH ULTRA TEST) test strip Use as instructed check blood sugar two times daily     . lisinopril-hydrochlorothiazide (PRINZIDE,ZESTORETIC) 10-12.5 MG tablet Take 0.5 tablets by mouth daily. 45 tablet 3  . metoprolol succinate (TOPROL-XL) 25 MG 24 hr tablet Take 0.5 tablets (12.5 mg total) by mouth daily. 45 tablet 3  . omeprazole  (PRILOSEC) 20 MG capsule TAKE 1 CAPSULE BY MOUTH ONCE DAILY 90 capsule 0  . repaglinide (PRANDIN) 2 MG tablet Take 1 tablet (2 mg total) 3 (three) times daily before meals by mouth. 90 tablet 3  . sildenafil (VIAGRA) 100 MG tablet TAKE ONE TABLET BY MOUTH ONCE DAILY AS NEEDED 6 tablet 29  . traZODone (DESYREL) 50 MG tablet TAKE 1 TABLET BY MOUTH AT  BEDTIME 90 tablet 3  . VICTOZA 18 MG/3ML SOPN INJECT 1.8MG  UNDER THE SKIN DAILY 3 pen 11   No current facility-administered medications on file prior to visit.     Allergies  Allergen Reactions  . Pioglitazone     REACTION: edema    Family History  Problem Relation Age of Onset  . Cancer Mother        Colon Cancer  . Colon cancer Mother   . Colon polyps Brother   . Esophageal cancer Neg Hx   . Rectal cancer Neg Hx   . Stomach cancer Neg Hx     BP (!) 150/88   Pulse 94   Wt 273 lb 6.4 oz (124 kg)   SpO2 (!) 87%   BMI 38.13 kg/m    Review of Systems He has irritation of both eyes, but no fever or wheezing.  He has chronic myalgias and left shoulder pain (no injury)    Objective:   Physical Exam VITAL SIGNS:  See vs page GENERAL: no distress head: no deformity  eyes: no periorbital swelling, no proptosis  external nose and ears are normal  mouth: no lesion seen Both tm's are red. LUNGS:  Clear to auscultation.  Left shoulder: full rom, but ROM is painful.   Lab Results  Component Value Date   HGBA1C 9.7 (A) 08/29/2017   Lab Results  Component Value Date   CREATININE 2.35 (H) 02/24/2017   BUN 24 (H) 02/24/2017   NA 135 02/24/2017   K 4.1 02/24/2017   CL 100 02/24/2017   CO2 27 02/24/2017      Assessment & Plan:  AOM, new Type 2 DM, with renal insuff: he needs insulin, but he declines.  We discussed risks.  Shoulder pain, new.  HTN: poss situational.  Recheck next time Myalgias, unlikely due to statin rx  Patient Instructions  I have sent a prescription to your pharmacy, for an antibiotic  pill. Loratadine-d (non-prescription) will help your congestion. Also, you should take antihistamine eye drops.   I hope you feel better soon.  If you don't feel better by next week, please call back.  Please call sooner if you get worse.   Please continue the same medications for diabetes. I have sent a prescription to your pharmacy, to change to a different cholesterol pill. blood tests are requested for you today.  We'll let you know about the results. Please call if you want to see Dr Tamala Julian for you shoulder.  Please come back for a follow-up appointment in 2 months.

## 2017-09-13 ENCOUNTER — Other Ambulatory Visit: Payer: Self-pay | Admitting: Endocrinology

## 2017-10-05 ENCOUNTER — Ambulatory Visit: Payer: No Typology Code available for payment source | Admitting: Endocrinology

## 2017-10-12 ENCOUNTER — Other Ambulatory Visit: Payer: Self-pay | Admitting: Endocrinology

## 2017-10-25 ENCOUNTER — Other Ambulatory Visit: Payer: Self-pay | Admitting: Endocrinology

## 2017-11-04 ENCOUNTER — Other Ambulatory Visit: Payer: Self-pay | Admitting: Endocrinology

## 2017-11-10 ENCOUNTER — Encounter: Payer: Self-pay | Admitting: Endocrinology

## 2017-11-10 ENCOUNTER — Other Ambulatory Visit: Payer: Self-pay | Admitting: Orthopedic Surgery

## 2017-11-10 ENCOUNTER — Ambulatory Visit: Payer: PRIVATE HEALTH INSURANCE | Admitting: Endocrinology

## 2017-11-10 ENCOUNTER — Telehealth: Payer: Self-pay | Admitting: Emergency Medicine

## 2017-11-10 VITALS — BP 148/90 | HR 89 | Temp 97.8°F | Ht 71.5 in | Wt 277.8 lb

## 2017-11-10 DIAGNOSIS — E1122 Type 2 diabetes mellitus with diabetic chronic kidney disease: Secondary | ICD-10-CM

## 2017-11-10 DIAGNOSIS — N181 Chronic kidney disease, stage 1: Secondary | ICD-10-CM

## 2017-11-10 LAB — POCT GLYCOSYLATED HEMOGLOBIN (HGB A1C): Hemoglobin A1C: 9.2 % — AB (ref 4.0–5.6)

## 2017-11-10 MED ORDER — FUROSEMIDE 20 MG PO TABS: 20.0000 mg | ORAL_TABLET | Freq: Every day | ORAL | 11 refills | Status: DC

## 2017-11-10 MED ORDER — INSULIN LISPRO 100 UNIT/ML (KWIKPEN): 10.0000 [IU] | PEN_INJECTOR | Freq: Three times a day (TID) | SUBCUTANEOUS | 11 refills | Status: DC

## 2017-11-10 NOTE — Telephone Encounter (Signed)
Pt has been placed in 8/15 at 1 pm

## 2017-11-10 NOTE — Telephone Encounter (Signed)
1 PM, 11/24/17

## 2017-11-10 NOTE — Patient Instructions (Addendum)
I have sent a prescription to your pharmacy, to change the repaglinide to insulin, and to add another fluid pill.  check your blood sugar twice a day.  vary the time of day when you check, between before the 3 meals, and at bedtime.  also check if you have symptoms of your blood sugar being too high or too low.  please keep a record of the readings and bring it to your next appointment here (or you can bring the meter itself).  You can write it on any piece of paper.  please call us sooner if your blood sugar goes below 70, or if you have a lot of readings over 200. Please come back for a follow-up appointment in 2-3 weeks.

## 2017-11-10 NOTE — Telephone Encounter (Signed)
Can you please schedule? If not please forward to [facility]

## 2017-11-10 NOTE — Telephone Encounter (Signed)
Called patient and let him know his appt is on 11/24/17 at 1pm. Can you please open this slot and schedule him? Thanks.

## 2017-11-10 NOTE — Telephone Encounter (Signed)
Pt needs a follow-up in 2-3 wks. You are scheduled out until September. Would you like we to work patient in somewhere before September? If so when and where and ill let Colletta Maryland know so she can add it to your schedule. I let patient know we will call him back with an appt and time. Thanks.

## 2017-11-10 NOTE — Progress Notes (Signed)
Subjective:    Patient ID: David Lindsey, male    DOB: 24-Dec-1955, 62 y.o.   MRN: [id number]  HPI Pt returns for f/u of diabetes mellitus: DM type: 2 Dx'ed: 2683 Complications: renal insufficiency Therapy: victoza + 3 oral meds DKA: never.   Severe hypoglycemia: never.   Pancreatitis: never.   Other: he has never been on insulin; he did not tolerate parlodel (tremor); he cannot take pioglitizone (edema); he works 3rd shift.  Interval history: no cbg record, but states cbg's are in the 200's.  pt states he feels well in general. Past Medical History:  Diagnosis Date  . ALCOHOLISM 12/22/2007  . ANEMIA, IRON DEFICIENCY 12/26/2007  . Arthritis   . Blood transfusion without reported diagnosis    as a child  . DIABETES MELLITUS, TYPE II 11/10/2006  . ESOPHAGITIS, REFLUX 12/22/2007  . GERD (gastroesophageal reflux disease)   . HYPERCHOLESTEROLEMIA 12/22/2007  . HYPERTENSION 11/10/2006  . HYPOGONADISM 12/22/2007   Prob. due to ETOH  . HYPOKALEMIA 06/06/2008  . Leukopenia   . Neuromuscular disorder ([place])   . Neuropathy    Chronic Neuropathy leg pain  . Pancytopenia 06/06/2008  . RENAL INSUFFICIENCY 11/10/2006    Past Surgical History:  Procedure Laterality Date  . APPENDECTOMY    . COLONOSCOPY  08-21-2004   [facility]- normal  . ELECTROCARDIOGRAM  11/07/2006  . ESOPHAGOGASTRODUODENOSCOPY  08/20/2004  . REPLACEMENT TOTAL KNEE Right   . UPPER GASTROINTESTINAL ENDOSCOPY      Social History   Socioeconomic History  . Marital status: Married    Spouse name: Not on file  . Number of children: Not on file  . Years of education: Not on file  . Highest education level: Not on file  Occupational History  . 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 Financial resource strain: Not on file  . Food insecurity:    Worry: Not on file    Inability: Not on file  . Transportation needs:    Medical: Not on file    Non-medical: Not on file  Tobacco Use  . Smoking  status: Never Smoker  . Smokeless tobacco: Never Used  Substance and Sexual Activity  . Alcohol use: No    Alcohol/week: 0.0 oz  . Drug use: No  . Sexual activity: Not on file  Lifestyle  . Physical activity:    Days per week: Not on file    Minutes per session: Not on file  . Stress: Not on file  Relationships  . Social connections:    Talks on phone: Not on file    Gets together: Not on file    Attends religious service: Not on file    Active member of club or organization: Not on file    Attends meetings of clubs or organizations: Not on file    Relationship status: Not on file  . Intimate partner violence:    Fear of current or ex partner: Not on file    Emotionally abused: Not on file    Physically abused: Not on file    Forced sexual activity: Not on file  Other Topics Concern  . Not on file  Social History Narrative  . Not on file    Current Outpatient Medications on File Prior to Visit  Medication Sig Dispense Refill  . acarbose (PRECOSE) 25 MG tablet Take 1 tablet (25 mg total) by mouth 3 (three) times daily with meals. 90 tablet 1  . aspirin 81  MG tablet Take 81 mg by mouth daily.      . canagliflozin (INVOKANA) 100 MG TABS tablet Take 1 tablet (100 mg total) daily before breakfast by mouth. 90 tablet 3  . clotrimazole-betamethasone (LOTRISONE) cream Apply 3x a day as needed for rash 30 g 0  . EASY TOUCH PEN NEEDLES 31G X 8 MM MISC USE ONCE DAILY WITH VICTOZA 100 each 11  . fish oil-omega-3 fatty acids 1000 MG capsule Take 3 capsules by mouth daily.      Marland Kitchen gabapentin (NEURONTIN) 300 MG capsule TAKE 2 CAPSULES BY MOUTH 3  TIMES A DAY 540 capsule 3  . glucose blood (ONE TOUCH ULTRA TEST) test strip Use as instructed check blood sugar two times daily     . lisinopril-hydrochlorothiazide (PRINZIDE,ZESTORETIC) 10-12.5 MG tablet Take 0.5 tablets by mouth daily. 45 tablet 3  . metoprolol succinate (TOPROL-XL) 25 MG 24 hr tablet Take 0.5 tablets (12.5 mg total) by mouth  daily. 45 tablet 3  . omeprazole (PRILOSEC) 20 MG capsule TAKE 1 CAPSULE BY MOUTH ONCE DAILY 90 capsule 0  . rosuvastatin (CRESTOR) 5 MG tablet Take 1 tablet (5 mg total) by mouth daily. 90 tablet 3  . sildenafil (VIAGRA) 100 MG tablet TAKE ONE TABLET BY MOUTH ONCE DAILY AS NEEDED 6 tablet 29  . traZODone (DESYREL) 50 MG tablet TAKE 1 TABLET BY MOUTH AT  BEDTIME 90 tablet 3  . VICTOZA 18 MG/3ML SOPN INJECT 1.8MG  UNDER THE SKIN DAILY 9 pen 4   No current facility-administered medications on file prior to visit.     Allergies  Allergen Reactions  . Pioglitazone     REACTION: edema    Family History  Problem Relation Age of Onset  . Cancer Mother        Colon Cancer  . Colon cancer Mother   . Colon polyps Brother   . Esophageal cancer Neg Hx   . Rectal cancer Neg Hx   . Stomach cancer Neg Hx     BP (!) 148/90 (BP Location: Right Arm, Patient Position: Sitting, Cuff Size: Normal)   Pulse 89   Temp 97.8 F (36.6 C) (Oral)   Ht 5' 11.5" (1.816 m)   Wt 277 lb 12.8 oz (126 kg)   SpO2 97%   BMI 38.21 kg/m    Review of Systems He denies hypoglycemia    Objective:   Physical Exam VITAL SIGNS:  See vs page GENERAL: no distress Pulses: foot pulses are intact bilaterally.   MSK: no deformity of the feet or ankles.  CV: 2+ bilat edema of the legs Skin:  no ulcer on the feet or ankles.  normal color and temp on the feet and ankles.   Neuro: sensation is intact to touch on the feet and ankles.     Lab Results  Component Value Date   HGBA1C 9.2 (A) 11/10/2017   Lab Results  Component Value Date   CREATININE 2.35 (H) 02/24/2017   BUN 24 (H) 02/24/2017   NA 135 02/24/2017   K 4.1 02/24/2017   CL 100 02/24/2017   CO2 27 02/24/2017       Assessment & Plan:  Type 2 DM, with renal insuff: worse. HTN: he needs increased rx.  Patient Instructions  I have sent a prescription to your pharmacy, to change the repaglinide to insulin, and to add another fluid pill.  check  your blood sugar twice a day.  vary the time of day when you check, between before the  3 meals, and at bedtime.  also check if you have symptoms of your blood sugar being too high or too low.  please keep a record of the readings and bring it to your next appointment here (or you can bring the meter itself).  You can write it on any piece of paper.  please call us sooner if your blood sugar goes below 70, or if you have a lot of readings over 200. Please come back for a follow-up appointment in 2-3 weeks.

## 2017-11-10 NOTE — Telephone Encounter (Signed)
Please advise 

## 2017-11-18 DIAGNOSIS — M1712 Unilateral primary osteoarthritis, left knee: Secondary | ICD-10-CM | POA: Diagnosis present

## 2017-11-18 NOTE — H&P (Signed)
TOTAL KNEE ADMISSION H&P  Patient is being admitted for left total knee arthroplasty.  Subjective:  Chief Complaint:left knee pain.  HPI: David Lindsey, 62 y.o. male, has a history of pain and functional disability in the left knee due to arthritis and has failed non-surgical conservative treatments for greater than 12 weeks to includecorticosteriod injections, flexibility and strengthening excercises, use of assistive devices, weight reduction as appropriate and activity modification.  Onset of symptoms was gradual, starting 2 years ago with gradually worsening course since that time. The patient noted no past surgery on the left knee(s).  Patient currently rates pain in the left knee(s) at 10 out of 10 with activity. Patient has night pain, worsening of pain with activity and weight bearing, pain that interferes with activities of daily living, pain with passive range of motion, crepitus and joint swelling.  Patient has evidence of joint space narrowing by imaging studies. There is no active infection.  Patient Active Problem List   Diagnosis Date Noted  . Degenerative arthritis of left knee 11/18/2017  . Left shoulder pain 08/29/2017  . History of total right knee replacement (TKR) 03/25/2017  . Arthralgia 02/24/2017  . Edema 01/13/2017  . Wellness examination 01/30/2015  . Myalgia and myositis 01/28/2014  . Unspecified constipation 03/13/2013  . Screening for prostate cancer 01/18/2013  . Diabetes mellitus with renal manifestation ([place]) 01/18/2013  . Encounter for long-term (current) use of other medications 03/24/2012  . HYPOKALEMIA 06/06/2008  . Pancytopenia ([place]) 06/06/2008  . Hypogonadism, male 12/22/2007  . HYPERCHOLESTEROLEMIA 12/22/2007  . ALCOHOLISM 12/22/2007  . ESOPHAGITIS, REFLUX 12/22/2007  . Leg pain 12/22/2007  . Essential hypertension 11/10/2006  . Disorder resulting from impaired renal function 11/10/2006   Past Medical History:  Diagnosis Date  . ALCOHOLISM  12/22/2007  . ANEMIA, IRON DEFICIENCY 12/26/2007  . Arthritis   . Blood transfusion without reported diagnosis    as a child  . DIABETES MELLITUS, TYPE II 11/10/2006  . ESOPHAGITIS, REFLUX 12/22/2007  . GERD (gastroesophageal reflux disease)   . HYPERCHOLESTEROLEMIA 12/22/2007  . HYPERTENSION 11/10/2006  . HYPOGONADISM 12/22/2007   Prob. due to ETOH  . HYPOKALEMIA 06/06/2008  . Leukopenia   . Neuromuscular disorder ([place])   . Neuropathy    Chronic Neuropathy leg pain  . Pancytopenia 06/06/2008  . RENAL INSUFFICIENCY 11/10/2006    Past Surgical History:  Procedure Laterality Date  . APPENDECTOMY    . COLONOSCOPY  08-21-2004   [facility]- normal  . ELECTROCARDIOGRAM  11/07/2006  . ESOPHAGOGASTRODUODENOSCOPY  08/20/2004  . REPLACEMENT TOTAL KNEE Right   . UPPER GASTROINTESTINAL ENDOSCOPY      No current facility-administered medications for this encounter.    Current Outpatient Medications  Medication Sig Dispense Refill Last Dose  . acetaminophen (TYLENOL) 500 MG tablet Take 1,000 mg by mouth 2 (two) times daily as needed for moderate pain.     . APPLE CIDER VINEGAR PO Take 250 mg by mouth daily.     Marland Kitchen aspirin 81 MG tablet Take 81 mg by mouth daily.     Taking  . canagliflozin (INVOKANA) 100 MG TABS tablet Take 1 tablet (100 mg total) daily before breakfast by mouth. (Patient taking differently: Take 100 mg by mouth daily at 3 pm. ) 90 tablet 3 Taking  . Carboxymethylcellul-Glycerin (LUBRICATING EYE DROPS OP) Place 1 drop into both eyes daily as needed (dry eyes).     . clotrimazole-betamethasone (LOTRISONE) cream Apply 3x a day as needed for rash 30 g 0 Taking  .  Coenzyme Q10 (COQ-10) 200 MG CAPS Take 200 mg by mouth daily.     . Cyanocobalamin (B-12) 5000 MCG CAPS Take 5,000 mcg by mouth daily.     Marland Kitchen docusate sodium (COLACE) 100 MG capsule Take 200 mg by mouth daily.     . Fiber, Guar Gum, CHEW Chew 1 tablet by mouth daily.     . furosemide (LASIX) 20 MG tablet Take 1 tablet (20 mg total) by  mouth daily. 30 tablet 11   . gabapentin (NEURONTIN) 300 MG capsule TAKE 2 CAPSULES BY MOUTH 3  TIMES A DAY 540 capsule 3 Taking  . insulin lispro (HUMALOG KWIKPEN) 100 UNIT/ML KiwkPen Inject 0.1 mLs (10 Units total) into the skin 3 (three) times daily with meals. And pen needles 4/day 15 mL 11   . lisinopril-hydrochlorothiazide (PRINZIDE,ZESTORETIC) 10-12.5 MG tablet Take 0.5 tablets by mouth daily. 45 tablet 3 Taking  . metoprolol succinate (TOPROL-XL) 25 MG 24 hr tablet Take 0.5 tablets (12.5 mg total) by mouth daily. (Patient taking differently: Take 12.5 mg by mouth daily at 3 pm. ) 45 tablet 3 Taking  . Multiple Vitamin (MULTIVITAMIN WITH MINERALS) TABS tablet Take 1 tablet by mouth daily.     . Multiple Vitamins-Minerals (HAIR/SKIN/NAILS) CAPS Take 1 tablet by mouth daily.     . Omega-3-6-9 CAPS Take 1 capsule by mouth daily.     Marland Kitchen omeprazole (PRILOSEC) 20 MG capsule TAKE 1 CAPSULE BY MOUTH ONCE DAILY (Patient taking differently: Take 20 mg by mouth once daily in the afternoon) 90 capsule 0 Taking  . repaglinide (PRANDIN) 2 MG tablet Take 2 mg by mouth 3 (three) times daily with meals.     . rosuvastatin (CRESTOR) 5 MG tablet Take 1 tablet (5 mg total) by mouth daily. (Patient taking differently: Take 5 mg by mouth at bedtime. ) 90 tablet 3 Taking  . sildenafil (VIAGRA) 100 MG tablet TAKE ONE TABLET BY MOUTH ONCE DAILY AS NEEDED (Patient taking differently: Take 100 mg by mouth as needed for erectile dysfunction. ) 6 tablet 29 Taking  . traZODone (DESYREL) 50 MG tablet TAKE 1 TABLET BY MOUTH AT  BEDTIME 90 tablet 3 Taking  . trolamine salicylate (ASPERCREME) 10 % cream Apply 1 application topically as needed (knee pain).     Marland Kitchen VICTOZA 18 MG/3ML SOPN INJECT 1.8MG  UNDER THE SKIN DAILY 9 pen 4 Taking  . vitamin E 400 UNIT capsule Take 400 Units by mouth daily.     Marland Kitchen ZINC-VITAMIN C PO Take 1 tablet by mouth daily.     Marland Kitchen acarbose (PRECOSE) 25 MG tablet Take 1 tablet (25 mg total) by mouth 3  (three) times daily with meals. (Patient not taking: Reported on 11/16/2017) 90 tablet 1 Not Taking at Unknown time  . EASY TOUCH PEN NEEDLES 31G X 8 MM MISC USE ONCE DAILY WITH VICTOZA 100 each 11 Taking  . glucose blood (ONE TOUCH ULTRA TEST) test strip Use as instructed check blood sugar two times daily    Taking   Allergies  Allergen Reactions  . Pioglitazone Swelling    Social History   Tobacco Use  . Smoking status: Never Smoker  . Smokeless tobacco: Never Used  Substance Use Topics  . Alcohol use: No    Alcohol/week: 0.0 standard drinks    Family History  Problem Relation Age of Onset  . Cancer Mother        Colon Cancer  . Colon cancer Mother   . Colon polyps Brother   .  Esophageal cancer Neg Hx   . Rectal cancer Neg Hx   . Stomach cancer Neg Hx      Review of Systems  Constitutional: Negative.   HENT: Negative.   Eyes:       Glasses  Respiratory: Negative.   Cardiovascular:       HTN  Gastrointestinal: Negative.   Genitourinary: Negative.   Musculoskeletal: Positive for joint pain.  Skin: Negative.   Neurological: Negative.   Endo/Heme/Allergies:       Diabetes  Psychiatric/Behavioral: Negative.     Objective:  Physical Exam  Constitutional: He is oriented to person, place, and time. He appears well-developed and well-nourished.  HENT:  Head: Normocephalic and atraumatic.  Eyes: Pupils are equal, round, and reactive to light.  Neck: Normal range of motion. Neck supple.  Cardiovascular: Intact distal pulses.  Respiratory: Effort normal.  Musculoskeletal: He exhibits tenderness.  the patient has good strength good range of motion in the right knee.  He does have a scar consistent with a right total knee arthroplasty.  Patient's left knee does have tenderness over both the medial and lateral joint lines.  Moderate crepitus with range of motion.  Mild swelling.  No erythema or warmth.  No instability with valgus or varus stress.  Patient's calves are soft  and nontender.  He is neurovascularly intact distally.  Neurological: He is alert and oriented to person, place, and time.  Skin: Skin is warm and dry.  Psychiatric: He has a normal mood and affect. His behavior is normal. Judgment and thought content normal.    Vital signs in last 24 hours:    Labs:   Estimated body mass index is 38.21 kg/m as calculated from the following:   Height as of 11/10/17: 5' 11.5" (1.816 m).   Weight as of 11/10/17: 126 kg.   Imaging Review Plain radiographs demonstrate bilateral AP weightbearing, bilateral Rosenberg, lateral sunrise views of the left knee are taken and reviewed in office today.  Patient does have near end-stage arthritis medial compartment left knee.   Preoperative templating of the joint replacement has been completed, documented, and submitted to the Operating Room personnel in order to optimize intra-operative equipment management.   Anticipated LOS equal to or greater than 2 midnights due to - Age 29 and older with one or more of the following:  - Obesity  - Expected need for hospital services (PT, OT, Nursing) required for safe  discharge  - Anticipated need for postoperative skilled nursing care or inpatient rehab  - Active co-morbidities: Diabetes   Assessment/Plan:  End stage arthritis, left knee   The patient history, physical examination, clinical judgment of the provider and imaging studies are consistent with end stage degenerative joint disease of the left knee(s) and total knee arthroplasty is deemed medically necessary. The treatment options including medical management, injection therapy arthroscopy and arthroplasty were discussed at length. The risks and benefits of total knee arthroplasty were presented and reviewed. The risks due to aseptic loosening, infection, stiffness, patella tracking problems, thromboembolic complications and other imponderables were discussed. The patient acknowledged the explanation, agreed to  proceed with the plan and consent was signed. Patient is being admitted for inpatient treatment for surgery, pain control, PT, OT, prophylactic antibiotics, VTE prophylaxis, progressive ambulation and ADL's and discharge planning. The patient is planning to be discharged home with home health services.

## 2017-11-21 ENCOUNTER — Encounter (HOSPITAL_COMMUNITY): Payer: Self-pay

## 2017-11-21 ENCOUNTER — Other Ambulatory Visit (HOSPITAL_COMMUNITY): Payer: Self-pay | Admitting: *Deleted

## 2017-11-21 NOTE — Progress Notes (Signed)
ekg 01-13-17 epic

## 2017-11-21 NOTE — Patient Instructions (Addendum)
David Lindsey  11/21/2017   Your procedure is scheduled on: 11-28-17  Report to [facility] Main  Entrance  Report to admitting at 930 AM    Call this number if you have problems the morning of surgery [phone]   Remember: Do not eat food or drink liquids :After Midnight.     Take these medicines the morning of surgery with A SIP OF WATER: GABAPENTIN (NEURONTIN), DO NOT TAKE ANY DIABETIC MEDICATIONS DAY OF YOUR SURGERY          How to Manage Your Diabetes Before and After Surgery  Why is it important to control my blood sugar before and after surgery? . Improving blood sugar levels before and after surgery helps healing and can limit problems. . A way of improving blood sugar control is eating a healthy diet by: o  Eating less sugar and carbohydrates o  Increasing activity/exercise o  Talking with your doctor about reaching your blood sugar goals . High blood sugars (greater than 180 mg/dL) can raise your risk of infections and slow your recovery, so you will need to focus on controlling your diabetes during the weeks before surgery. . Make sure that the doctor who takes care of your diabetes knows about your planned surgery including the date and location.  How do I manage my blood sugar before surgery? . Check your blood sugar at least 4 times a day, starting 2 days before surgery, to make sure that the level is not too high or low. o Check your blood sugar the morning of your surgery when you wake up and every 2 hours until you get to the Short Stay unit. . If your blood sugar is less than 70 mg/dL, you will need to treat for low blood sugar: o Do not take insulin. o Treat a low blood sugar (less than 70 mg/dL) with  cup of clear juice (cranberry or apple), 4 glucose tablets, OR glucose gel. o Recheck blood sugar in 15 minutes after treatment (to make sure it is greater than 70 mg/dL). If your blood sugar is not greater than 70 mg/dL on recheck, call  [phone] for further instructions. . Report your blood sugar to the short stay nurse when you get to Short Stay.  . If you are admitted to the hospital after surgery: o Your blood sugar will be checked by the staff and you will probably be given insulin after surgery (instead of oral diabetes medicines) to make sure you have good blood sugar levels. o The goal for blood sugar control after surgery is 80-180 mg/dL.   WHAT DO I DO ABOUT MY DIABETES MEDICATION?  Marland Kitchen Do not take oral diabetes medicines (pills) the morning of surgery.  . THE  BEFORE SURGERY TAKE INVOKANA AS USUAL, DO NOT TAKE INVOKANA MORNING OF SURGERY .  THE DAY BEFORE SURGERY take     units of       insulin.       . THE MORNING OF SURGERY 11-28-17 IF YOUR BLOOD SUGAR IS GREATER THAN 220 TAKE 1/2 OF YOUR USUAL SLIDING SCALE INSULIN  . The day of surgery, do not take other diabetes injectables, including Byetta (exenatide), Bydureon (exenatide ER), Victoza (liraglutide), or Trulicity (dulaglutide).   THE DAY BEFORE SURGERY TAKE ONLY MORNING AND LUNCH TIME DOSES.OF REPAGLINIDE (PRANDIN), DO NOT TAKE ONLY MORNING OF SURGERY.  Patient Signature:  Date:   Nurse Signature:  Date:   Reviewed and Endorsed by [facility] Patient Education Committee, August 2015                      You may not have any metal on your body including hair pins and              piercings  Do not wear jewelry, make-up, lotions, powders or perfumes, deodorant             Do not wear nail polish.  Do not shave  48 hours prior to surgery.              Men may shave face and neck.   Do not bring valuables to the hospital. [place].  Contacts, dentures or bridgework may not be worn into surgery.  Leave suitcase in the car. After surgery it may be brought to your room.     ______  [facility] - Preparing for Surgery Before surgery, you can play an important role.  Because skin is not sterile, your  skin needs to be as free of germs as possible.  You can reduce the number of germs on your skin by washing with CHG (chlorahexidine gluconate) soap before surgery.  CHG is an antiseptic cleaner which kills germs and bonds with the skin to continue killing germs even after washing. Please DO NOT use if you have an allergy to CHG or antibacterial soaps.  If your skin becomes reddened/irritated stop using the CHG and inform your nurse when you arrive at Short Stay. Do not shave (including legs and underarms) for at least 48 hours prior to the first CHG shower.  You may shave your face/neck. Please follow these instructions carefully:  1.  Shower with CHG Soap the night before surgery and the  morning of Surgery.  2.  If you choose to wash your hair, wash your hair first as usual with your  normal  shampoo.  3.  After you shampoo, rinse your hair and body thoroughly to remove the  shampoo.                           4.  Use CHG as you would any other liquid soap.  You can apply chg directly  to the skin and wash                       Gently with a scrungie or clean washcloth.  5.  Apply the CHG Soap to your body ONLY FROM THE NECK DOWN.   Do not use on face/ open                           Wound or open sores. Avoid contact with eyes, ears mouth and genitals (private parts).                       Wash face,  Genitals (private parts) with your normal soap.             6.  Wash thoroughly, paying special attention to the area where your surgery  will be performed.  7.  Thoroughly rinse your body with warm water from the neck down.  8.  DO NOT shower/wash with your normal soap after  using and rinsing off  the CHG Soap.                9.  Pat yourself dry with a clean towel.            10.  Wear clean pajamas.            11.  Place clean sheets on your bed the night of your first shower and do not  sleep with pets. Day of Surgery : Do not apply any lotions/deodorants the morning of surgery.  Please wear  clean clothes to the hospital/surgery center.  FAILURE TO FOLLOW THESE INSTRUCTIONS MAY RESULT IN THE CANCELLATION OF YOUR SURGERY PATIENT SIGNATURE_________________________________  NURSE SIGNATURE__________________________________  ________________________________________________________________________   Adam Phenix  An incentive spirometer is a tool that can help keep your lungs clear and active. This tool measures how well you are filling your lungs with each breath. Taking long deep breaths may help reverse or decrease the chance of developing breathing (pulmonary) problems (especially infection) following:  A long period of time when you are unable to move or be active. BEFORE THE PROCEDURE   If the spirometer includes an indicator to show your best effort, your nurse or respiratory therapist will set it to a desired goal.  If possible, sit up straight or lean slightly forward. Try not to slouch.  Hold the incentive spirometer in an upright position. INSTRUCTIONS FOR USE  1. Sit on the edge of your bed if possible, or sit up as far as you can in bed or on a chair. 2. Hold the incentive spirometer in an upright position. 3. Breathe out normally. 4. Place the mouthpiece in your mouth and seal your lips tightly around it. 5. Breathe in slowly and as deeply as possible, raising the piston or the ball toward the top of the column. 6. Hold your breath for 3-5 seconds or for as long as possible. Allow the piston or ball to fall to the bottom of the column. 7. Remove the mouthpiece from your mouth and breathe out normally. 8. Rest for a few seconds and repeat Steps 1 through 7 at least 10 times every 1-2 hours when you are awake. Take your time and take a few normal breaths between deep breaths. 9. The spirometer may include an indicator to show your best effort. Use the indicator as a goal to work toward during each repetition. 10. After each set of 10 deep breaths, practice  coughing to be sure your lungs are clear. If you have an incision (the cut made at the time of surgery), support your incision when coughing by placing a pillow or rolled up towels firmly against it. Once you are able to get out of bed, walk around indoors and cough well. You may stop using the incentive spirometer when instructed by your caregiver.  RISKS AND COMPLICATIONS  Take your time so you do not get dizzy or light-headed.  If you are in pain, you may need to take or ask for pain medication before doing incentive spirometry. It is harder to take a deep breath if you are having pain. AFTER USE  Rest and breathe slowly and easily.  It can be helpful to keep track of a log of your progress. Your caregiver can provide you with a simple table to help with this. If you are using the spirometer at home, follow these instructions: [place] IF:   You are having difficultly using the spirometer.  You have  trouble using the spirometer as often as instructed.  Your pain medication is not giving enough relief while using the spirometer.  You develop fever of 100.5 F (38.1 C) or higher. SEEK IMMEDIATE MEDICAL CARE IF:   You cough up bloody sputum that had not been present before.  You develop fever of 102 F (38.9 C) or greater.  You develop worsening pain at or near the incision site. MAKE SURE YOU:   Understand these instructions.  Will watch your condition.  Will get help right away if you are not doing well or get worse. Document Released: 08/09/2006 Document Revised: 06/21/2011 Document Reviewed: 10/10/2006 Exit[employer] Patient Information 2014 [employer], [place].   ________________________________________________________________________  WHAT IS A BLOOD TRANSFUSION? Blood Transfusion Information  A transfusion is the replacement of blood or some of its parts. Blood is made up of multiple cells which provide different functions.  Red blood cells carry oxygen and are  used for blood loss replacement.  White blood cells fight against infection.  Platelets control bleeding.  Plasma helps clot blood.  Other blood products are available for specialized needs, such as hemophilia or other clotting disorders. BEFORE THE TRANSFUSION  Who gives blood for transfusions?   Healthy volunteers who are fully evaluated to make sure their blood is safe. This is blood bank blood. Transfusion therapy is the safest it has ever been in the practice of medicine. Before blood is taken from a donor, a complete history is taken to make sure that person has no history of diseases nor engages in risky social behavior (examples are intravenous drug use or sexual activity with multiple partners). The donor's travel history is screened to minimize risk of transmitting infections, such as malaria. The donated blood is tested for signs of infectious diseases, such as HIV and hepatitis. The blood is then tested to be sure it is compatible with you in order to minimize the chance of a transfusion reaction. If you or a relative donates blood, this is often done in anticipation of surgery and is not appropriate for emergency situations. It takes many days to process the donated blood. RISKS AND COMPLICATIONS Although transfusion therapy is very safe and saves many lives, the main dangers of transfusion include:   Getting an infectious disease.  Developing a transfusion reaction. This is an allergic reaction to something in the blood you were given. Every precaution is taken to prevent this. The decision to have a blood transfusion has been considered carefully by your caregiver before blood is given. Blood is not given unless the benefits outweigh the risks. AFTER THE TRANSFUSION  Right after receiving a blood transfusion, you will usually feel much better and more energetic. This is especially true if your red blood cells have gotten low (anemic). The transfusion raises the level of the red  blood cells which carry oxygen, and this usually causes an energy increase.  The nurse administering the transfusion will monitor you carefully for complications. HOME CARE INSTRUCTIONS  No special instructions are needed after a transfusion. You may find your energy is better. Speak with your caregiver about any limitations on activity for underlying diseases you may have. SEEK MEDICAL CARE IF:   Your condition is not improving after your transfusion.  You develop redness or irritation at the intravenous (IV) site. SEEK IMMEDIATE MEDICAL CARE IF:  Any of the following symptoms occur over the next 12 hours:  Shaking chills.  You have a temperature by mouth above 102 F (38.9 C), not controlled by  medicine.  Chest, back, or muscle pain.  People around you feel you are not acting correctly or are confused.  Shortness of breath or difficulty breathing.  Dizziness and fainting.  You get a rash or develop hives.  You have a decrease in urine output.  Your urine turns a dark color or changes to pink, red, or brown. Any of the following symptoms occur over the next 10 days:  You have a temperature by mouth above 102 F (38.9 C), not controlled by medicine.  Shortness of breath.  Weakness after normal activity.  The white part of the eye turns yellow (jaundice).  You have a decrease in the amount of urine or are urinating less often.  Your urine turns a dark color or changes to pink, red, or brown. Document Released: 03/26/2000 Document Revised: 06/21/2011 Document Reviewed: 11/13/2007 [facility] Patient Information 2014 [place], [place].  _______________________________________________________________________

## 2017-11-22 ENCOUNTER — Encounter (HOSPITAL_COMMUNITY): Payer: Self-pay

## 2017-11-22 ENCOUNTER — Telehealth: Payer: Self-pay | Admitting: Endocrinology

## 2017-11-22 ENCOUNTER — Encounter (HOSPITAL_COMMUNITY)
Admission: RE | Admit: 2017-11-22 | Discharge: 2017-11-22 | Disposition: A | Discharge status: Home / Self Care | Payer: PRIVATE HEALTH INSURANCE | Source: Ambulatory Visit | Attending: Orthopedic Surgery | Admitting: Orthopedic Surgery

## 2017-11-22 ENCOUNTER — Other Ambulatory Visit: Payer: Self-pay

## 2017-11-22 ENCOUNTER — Ambulatory Visit (HOSPITAL_COMMUNITY)
Admission: RE | Admit: 2017-11-22 | Discharge: 2017-11-22 | Disposition: A | Discharge status: Home / Self Care | Payer: PRIVATE HEALTH INSURANCE | Source: Ambulatory Visit | Attending: Orthopedic Surgery | Admitting: Orthopedic Surgery

## 2017-11-22 DIAGNOSIS — Z01818 Encounter for other preprocedural examination: Secondary | ICD-10-CM | POA: Insufficient documentation

## 2017-11-22 DIAGNOSIS — M1712 Unilateral primary osteoarthritis, left knee: Secondary | ICD-10-CM | POA: Diagnosis not present

## 2017-11-22 DIAGNOSIS — Z01812 Encounter for preprocedural laboratory examination: Secondary | ICD-10-CM | POA: Insufficient documentation

## 2017-11-22 LAB — URINALYSIS, ROUTINE W REFLEX MICROSCOPIC
BILIRUBIN URINE: NEGATIVE
Bacteria, UA: NONE SEEN
Glucose, UA: >=500 mg/dL — AB
HGB URINE DIPSTICK: NEGATIVE
Ketones, ur: NEGATIVE mg/dL
LEUKOCYTES UA: NEGATIVE
NITRITE: NEGATIVE
PROTEIN: NEGATIVE mg/dL
Specific Gravity, Urine: 1.009 (ref 1.005–1.030)
pH: 7 (ref 5.0–8.0)

## 2017-11-22 LAB — CBC WITH DIFFERENTIAL/PLATELET
BASOS PCT: 0 %
Basophils Absolute: 0 10*3/uL (ref 0.0–0.1)
EOS ABS: 0.2 10*3/uL (ref 0.0–0.7)
EOS PCT: 6 %
HCT: 44.2 % (ref 39.0–52.0)
Hemoglobin: 14.8 g/dL (ref 13.0–17.0)
LYMPHS ABS: 1.3 10*3/uL (ref 0.7–4.0)
Lymphocytes Relative: 34 %
MCH: 28.8 pg (ref 26.0–34.0)
MCHC: 33.5 g/dL (ref 30.0–36.0)
MCV: 86 fL (ref 78.0–100.0)
MONO ABS: 0.4 10*3/uL (ref 0.1–1.0)
MONOS PCT: 10 %
Neutro Abs: 1.9 10*3/uL (ref 1.7–7.7)
Neutrophils Relative %: 50 %
Platelets: 161 10*3/uL (ref 150–400)
RBC: 5.14 MIL/uL (ref 4.22–5.81)
RDW: 13.1 % (ref 11.5–15.5)
WBC: 3.8 10*3/uL — ABNORMAL LOW (ref 4.0–10.5)

## 2017-11-22 LAB — BASIC METABOLIC PANEL
Anion gap: 9 (ref 5–15)
BUN: 20 mg/dL (ref 8–23)
CALCIUM: 9.4 mg/dL (ref 8.9–10.3)
CHLORIDE: 102 mmol/L (ref 98–111)
CO2: 26 mmol/L (ref 22–32)
CREATININE: 2.04 mg/dL — AB (ref 0.61–1.24)
GFR calc Af Amer: 38 mL/min — ABNORMAL LOW (ref >60)
GFR calc non Af Amer: 33 mL/min — ABNORMAL LOW (ref >60)
GLUCOSE: 90 mg/dL (ref 70–99)
Potassium: 4 mmol/L (ref 3.5–5.1)
Sodium: 137 mmol/L (ref 135–145)

## 2017-11-22 LAB — PROTIME-INR
INR: 1.02
PROTHROMBIN TIME: 13.3 s (ref 11.4–15.2)

## 2017-11-22 LAB — HEMOGLOBIN A1C
Hgb A1c MFr Bld: 8.7 % — ABNORMAL HIGH (ref 4.8–5.6)
MEAN PLASMA GLUCOSE: 202.99 mg/dL

## 2017-11-22 LAB — GLUCOSE, CAPILLARY: Glucose-Capillary: 96 mg/dL (ref 70–99)

## 2017-11-22 LAB — APTT: aPTT: 34 seconds (ref 24–36)

## 2017-11-22 LAB — SURGICAL PCR SCREEN
MRSA, PCR: NEGATIVE
STAPHYLOCOCCUS AUREUS: NEGATIVE

## 2017-11-22 NOTE — Progress Notes (Signed)
Spoke with Juliann Pulse blume dr Reginia Forts scheduler and made aware patient hemaglobin  A1C 8.7 with pre op labs today. Juliann Pulse to make dr Ollen Barges aware.

## 2017-11-22 NOTE — Telephone Encounter (Signed)
LMTCB

## 2017-11-22 NOTE — Telephone Encounter (Signed)
Patient canceled appointment tomorrow because he stated he has already been seen at another follow up and they check his A1C and felt he did not need come in to have this done.  He would like to know if he should go ahead and schedule another visit for this with Dr Loanne Drilling   Please advise

## 2017-11-22 NOTE — Telephone Encounter (Signed)
Please come back for a follow-up appointment in 2 months.    

## 2017-11-22 NOTE — Telephone Encounter (Signed)
Please advise on below  

## 2017-11-24 ENCOUNTER — Ambulatory Visit: Payer: No Typology Code available for payment source | Admitting: Endocrinology

## 2017-11-28 ENCOUNTER — Ambulatory Visit (HOSPITAL_COMMUNITY)
Admission: RE | Admit: 2017-11-28 | Payer: PRIVATE HEALTH INSURANCE | Source: Ambulatory Visit | Admitting: Orthopedic Surgery

## 2017-11-28 ENCOUNTER — Encounter (HOSPITAL_COMMUNITY): Admission: RE | Payer: Self-pay | Source: Ambulatory Visit

## 2017-11-28 LAB — TYPE AND SCREEN
ABO/RH(D): O POS
Antibody Screen: NEGATIVE

## 2017-11-28 SURGERY — ARTHROPLASTY, KNEE, TOTAL
Anesthesia: Spinal | Site: Knee | Laterality: Left

## 2017-12-01 ENCOUNTER — Other Ambulatory Visit: Payer: Self-pay | Admitting: Endocrinology

## 2017-12-29 ENCOUNTER — Ambulatory Visit (INDEPENDENT_AMBULATORY_CARE_PROVIDER_SITE_OTHER): Payer: No Typology Code available for payment source | Admitting: Endocrinology

## 2017-12-29 ENCOUNTER — Encounter: Payer: Self-pay | Admitting: Endocrinology

## 2017-12-29 VITALS — BP 104/62 | HR 99 | Ht 71.0 in | Wt 278.2 lb

## 2017-12-29 DIAGNOSIS — Z23 Encounter for immunization: Secondary | ICD-10-CM

## 2017-12-29 DIAGNOSIS — N181 Chronic kidney disease, stage 1: Secondary | ICD-10-CM | POA: Diagnosis not present

## 2017-12-29 DIAGNOSIS — E1122 Type 2 diabetes mellitus with diabetic chronic kidney disease: Secondary | ICD-10-CM | POA: Diagnosis not present

## 2017-12-29 NOTE — Progress Notes (Signed)
Subjective:    Patient ID: David Lindsey, male    DOB: [birth year], 62 y.o.   MRN: [id number]  HPI Pt returns for f/u of diabetes mellitus: DM type: 2 Dx'ed: 9379 Complications: renal insufficiency Therapy: insulin since 2019, victoza, and 2 oral meds DKA: never.   Severe hypoglycemia: never.   Pancreatitis: never.   Other: he has never been on insulin; he did not tolerate parlodel (tremor); he cannot take pioglitizone (edema); he works 3rd shift.   Interval history: no cbg record, but states cbg's are in the mid-100's.  pt states he feels well in general.  He wants to be cleared for knee surgery.   Past Medical History:  Diagnosis Date  . ALCOHOLISM 12/22/2007  . ANEMIA, IRON DEFICIENCY 12/26/2007  . Arthritis    OA  . Blood transfusion without reported diagnosis    as a child  . DIABETES MELLITUS, TYPE II 11/10/2006  . ESOPHAGITIS, REFLUX 12/22/2007  . GERD (gastroesophageal reflux disease)   . HYPERCHOLESTEROLEMIA 12/22/2007  . HYPERTENSION 11/10/2006  . HYPOGONADISM 12/22/2007   Prob. due to ETOH  . HYPOKALEMIA 06/06/2008  . Leukopenia   . Neuropathy    Chronic Neuropathy BOTH FEET  . Pancytopenia 06/06/2008  . RENAL INSUFFICIENCY 11/10/2006   SEES [facility] [facility] STAGE 2 CKD    Past Surgical History:  Procedure Laterality Date  . APPENDECTOMY  AGE 57 OR 10  . COLONOSCOPY  08-21-2004   [facility]- normal  . COLONSCOPY  2017  . ELECTROCARDIOGRAM  11/07/2006  . ESOPHAGOGASTRODUODENOSCOPY  08/20/2004  . EYE SURGERY Bilateral 2014   [facility] FOR CATARACTS  . REPLACEMENT TOTAL KNEE Right 2011  . UPPER GASTROINTESTINAL ENDOSCOPY      Social History   Socioeconomic History  . Marital status: Married    Spouse name: Not on file  . Number of children: Not on file  . Years of education: Not on file  . Highest education level: Not on file  Occupational History  . 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 Financial resource strain: Not on file  . Food  insecurity:    Worry: Not on file    Inability: Not on file  . Transportation needs:    Medical: Not on file    Non-medical: Not on file  Tobacco Use  . Smoking status: Never Smoker  . Smokeless tobacco: Never Used  Substance and Sexual Activity  . Alcohol use: No    Alcohol/week: 0.0 standard drinks    Comment: LAST USE 10 YRS AGO  . Drug use: No  . Sexual activity: Not on file  Lifestyle  . Physical activity:    Days per week: Not on file    Minutes per session: Not on file  . Stress: Not on file  Relationships  . Social connections:    Talks on phone: Not on file    Gets together: Not on file    Attends religious service: Not on file    Active member of club or organization: Not on file    Attends meetings of clubs or organizations: Not on file    Relationship status: Not on file  . Intimate partner violence:    Fear of current or ex partner: Not on file    Emotionally abused: Not on file    Physically abused: Not on file    Forced sexual activity: Not on file  Other Topics Concern  . Not on file  Social History Narrative  .  Not on file    Current Outpatient Medications on File Prior to Visit  Medication Sig Dispense Refill  . acarbose (PRECOSE) 25 MG tablet Take 1 tablet (25 mg total) by mouth 3 (three) times daily with meals. 90 tablet 1  . acetaminophen (TYLENOL) 500 MG tablet Take 1,000 mg by mouth 2 (two) times daily as needed for moderate pain.    . APPLE CIDER VINEGAR PO Take 250 mg by mouth daily.    Marland Kitchen aspirin 81 MG tablet Take 81 mg by mouth daily.      . canagliflozin (INVOKANA) 100 MG TABS tablet Take 1 tablet (100 mg total) daily before breakfast by mouth. (Patient taking differently: Take 100 mg by mouth daily at 3 pm. ) 90 tablet 3  . Carboxymethylcellul-Glycerin (LUBRICATING EYE DROPS OP) Place 1 drop into both eyes daily as needed (dry eyes).    . clotrimazole-betamethasone (LOTRISONE) cream Apply 3x a day as needed for rash 30 g 0  . Coenzyme Q10  (COQ-10) 200 MG CAPS Take 200 mg by mouth daily.    . Cyanocobalamin (B-12) 5000 MCG CAPS Take 5,000 mcg by mouth daily.    Marland Kitchen docusate sodium (COLACE) 100 MG capsule Take 200 mg by mouth daily.    Marland Kitchen EASY TOUCH PEN NEEDLES 31G X 8 MM MISC USE ONCE DAILY WITH VICTOZA 100 each 11  . Fiber, Guar Gum, CHEW Chew 1 tablet by mouth daily.    . furosemide (LASIX) 20 MG tablet Take 1 tablet (20 mg total) by mouth daily. 30 tablet 11  . gabapentin (NEURONTIN) 300 MG capsule TAKE 2 CAPSULES BY MOUTH 3  TIMES A DAY 540 capsule 3  . glucose blood (ONE TOUCH ULTRA TEST) test strip Use as instructed check blood sugar two times daily     . insulin lispro (HUMALOG KWIKPEN) 100 UNIT/ML KiwkPen Inject 0.1 mLs (10 Units total) into the skin 3 (three) times daily with meals. And pen needles 4/day 15 mL 11  . lisinopril-hydrochlorothiazide (PRINZIDE,ZESTORETIC) 10-12.5 MG tablet Take 0.5 tablets by mouth daily. (Patient taking differently: Take 0.5 tablets by mouth at bedtime. ) 45 tablet 3  . metoprolol succinate (TOPROL-XL) 25 MG 24 hr tablet Take 0.5 tablets (12.5 mg total) by mouth daily. (Patient taking differently: Take 12.5 mg by mouth at bedtime. ) 45 tablet 3  . Multiple Vitamin (MULTIVITAMIN WITH MINERALS) TABS tablet Take 1 tablet by mouth daily.    . Multiple Vitamins-Minerals (HAIR/SKIN/NAILS) CAPS Take 1 tablet by mouth daily.    . Omega-3-6-9 CAPS Take 1 capsule by mouth daily.    Marland Kitchen omeprazole (PRILOSEC) 20 MG capsule TAKE 1 CAPSULE BY MOUTH ONCE DAILY 90 capsule 0  . rosuvastatin (CRESTOR) 5 MG tablet Take 1 tablet (5 mg total) by mouth daily. (Patient taking differently: Take 5 mg by mouth at bedtime. ) 90 tablet 3  . sildenafil (VIAGRA) 100 MG tablet TAKE ONE TABLET BY MOUTH ONCE DAILY AS NEEDED (Patient taking differently: Take 100 mg by mouth as needed for erectile dysfunction. ) 6 tablet 29  . traZODone (DESYREL) 50 MG tablet TAKE 1 TABLET BY MOUTH AT  BEDTIME 90 tablet 3  . trolamine salicylate  (ASPERCREME) 10 % cream Apply 1 application topically as needed (knee pain).    Marland Kitchen VICTOZA 18 MG/3ML SOPN INJECT 1.8MG  UNDER THE SKIN DAILY 9 pen 4  . vitamin E 400 UNIT capsule Take 400 Units by mouth daily.    Marland Kitchen ZINC-VITAMIN C PO Take 1 tablet by mouth  daily.     No current facility-administered medications on file prior to visit.     Allergies  Allergen Reactions  . Pioglitazone Swelling    Family History  Problem Relation Age of Onset  . Cancer Mother        Colon Cancer  . Colon cancer Mother   . Colon polyps Brother   . Esophageal cancer Neg Hx   . Rectal cancer Neg Hx   . Stomach cancer Neg Hx     BP 104/62   Pulse 99   Ht 5\' 11"  (1.803 m)   Wt 278 lb 3.2 oz (126.2 kg)   SpO2 97%   BMI 38.80 kg/m   Review of Systems He denies hypoglycemia    Objective:   Physical Exam VITAL SIGNS:  See vs page GENERAL: no distress Pulses: foot pulses are intact bilaterally.   MSK: no deformity of the feet or ankles.  CV: 1+ bilat edema of the legs Skin:  no ulcer on the feet or ankles.  normal color and temp on the feet and ankles.   Neuro: sensation is intact to touch on the feet and ankles.   Ext: There is bilateral onychomycosis of the toenails.       Assessment & Plan:  Type 2 DM, with renal insuff: glycemic control is apparently new. preop evaluation, new.    Patient Instructions  Please continue the same medications.   blood tests are requested for you today.  We'll let you know about the results. Based on the result, I hope we can clear you for the knee surgery.   check your blood sugar twice a day.  vary the time of day when you check, between before the 3 meals, and at bedtime.  also check if you have symptoms of your blood sugar being too high or too low.  please keep a record of the readings and bring it to your next appointment here (or you can bring the meter itself).  You can write it on any piece of paper.  please call us sooner if your blood sugar goes  below 70, or if you have a lot of readings over 200.  Please come back for a regular physical appointment in 3 weeks.

## 2017-12-29 NOTE — Patient Instructions (Addendum)
Please continue the same medications.   blood tests are requested for you today.  We'll let you know about the results. Based on the result, I hope we can clear you for the knee surgery.   check your blood sugar twice a day.  vary the time of day when you check, between before the 3 meals, and at bedtime.  also check if you have symptoms of your blood sugar being too high or too low.  please keep a record of the readings and bring it to your next appointment here (or you can bring the meter itself).  You can write it on any piece of paper.  please call us sooner if your blood sugar goes below 70, or if you have a lot of readings over 200.  Please come back for a regular physical appointment in 3 weeks.

## 2017-12-30 LAB — FRUCTOSAMINE: FRUCTOSAMINE: 348 umol/L — AB (ref 190–270)

## 2018-01-13 ENCOUNTER — Telehealth: Payer: Self-pay | Admitting: Endocrinology

## 2018-01-13 NOTE — Telephone Encounter (Signed)
The hospital denied his surgery until his A1C is less than 7.5. It was 8.7 last time checked. \

## 2018-01-13 NOTE — Telephone Encounter (Signed)
Pt want to know if he is cleared for knee surgery. He stated Dr Loanne Drilling told him he was but the hospital said he wasn't. Please call pt

## 2018-01-13 NOTE — Telephone Encounter (Signed)
Yes, he is cleared.  It is in the 9/19 result note

## 2018-01-13 NOTE — Telephone Encounter (Signed)
Please advise on below  

## 2018-01-19 ENCOUNTER — Encounter: Payer: Self-pay | Admitting: Endocrinology

## 2018-01-19 ENCOUNTER — Other Ambulatory Visit: Payer: Self-pay | Admitting: Orthopedic Surgery

## 2018-01-19 ENCOUNTER — Ambulatory Visit (INDEPENDENT_AMBULATORY_CARE_PROVIDER_SITE_OTHER): Payer: No Typology Code available for payment source | Admitting: Endocrinology

## 2018-01-19 VITALS — BP 134/82 | HR 84 | Ht 71.0 in | Wt 281.5 lb

## 2018-01-19 DIAGNOSIS — E1122 Type 2 diabetes mellitus with diabetic chronic kidney disease: Secondary | ICD-10-CM | POA: Diagnosis not present

## 2018-01-19 DIAGNOSIS — Z0000 Encounter for general adult medical examination without abnormal findings: Secondary | ICD-10-CM

## 2018-01-19 DIAGNOSIS — N181 Chronic kidney disease, stage 1: Secondary | ICD-10-CM

## 2018-01-19 DIAGNOSIS — Z125 Encounter for screening for malignant neoplasm of prostate: Secondary | ICD-10-CM

## 2018-01-19 LAB — URINALYSIS, ROUTINE W REFLEX MICROSCOPIC
BILIRUBIN URINE: NEGATIVE
HGB URINE DIPSTICK: NEGATIVE
Ketones, ur: NEGATIVE
LEUKOCYTES UA: NEGATIVE
Nitrite: NEGATIVE
PH: 6.5 (ref 5.0–8.0)
RBC / HPF: NONE SEEN (ref 0–?)
Specific Gravity, Urine: <=1.005 — AB (ref 1.000–1.030)
TOTAL PROTEIN, URINE-UPE24: NEGATIVE
UROBILINOGEN UA: 0.2 (ref 0.0–1.0)
Urine Glucose: >=1000 — AB
WBC UA: NONE SEEN (ref 0–?)

## 2018-01-19 LAB — POCT GLYCOSYLATED HEMOGLOBIN (HGB A1C): HEMOGLOBIN A1C: 7.4 % — AB (ref 4.0–5.6)

## 2018-01-19 LAB — LIPID PANEL
CHOL/HDL RATIO: 4
Cholesterol: 157 mg/dL (ref 0–200)
HDL: 42.7 mg/dL (ref >39.00)
LDL CALC: 98 mg/dL (ref 0–99)
NonHDL: 113.84
TRIGLYCERIDES: 81 mg/dL (ref 0.0–149.0)
VLDL: 16.2 mg/dL (ref 0.0–40.0)

## 2018-01-19 LAB — HEPATIC FUNCTION PANEL
ALBUMIN: 4.2 g/dL (ref 3.5–5.2)
ALK PHOS: 65 U/L (ref 39–117)
ALT: 21 U/L (ref 0–53)
AST: 17 U/L (ref 0–37)
Bilirubin, Direct: 0 mg/dL (ref 0.0–0.3)
Total Bilirubin: 0.4 mg/dL (ref 0.2–1.2)
Total Protein: 7.2 g/dL (ref 6.0–8.3)

## 2018-01-19 LAB — URIC ACID: Uric Acid, Serum: 5.7 mg/dL (ref 4.0–7.8)

## 2018-01-19 LAB — BASIC METABOLIC PANEL
BUN: 20 mg/dL (ref 6–23)
CHLORIDE: 101 meq/L (ref 96–112)
CO2: 27 meq/L (ref 19–32)
CREATININE: 2.12 mg/dL — AB (ref 0.40–1.50)
Calcium: 9.5 mg/dL (ref 8.4–10.5)
GFR: 40.65 mL/min — ABNORMAL LOW (ref >60.00)
Glucose, Bld: 188 mg/dL — ABNORMAL HIGH (ref 70–99)
Potassium: 4.1 mEq/L (ref 3.5–5.1)
Sodium: 136 mEq/L (ref 135–145)

## 2018-01-19 LAB — MICROALBUMIN / CREATININE URINE RATIO
Creatinine,U: 63.3 mg/dL
Microalb Creat Ratio: 4.2 mg/g (ref 0.0–30.0)
Microalb, Ur: 2.7 mg/dL — ABNORMAL HIGH (ref 0.0–1.9)

## 2018-01-19 LAB — TSH: TSH: 1.08 u[IU]/mL (ref 0.35–4.50)

## 2018-01-19 LAB — PSA: PSA: 0.91 ng/mL (ref 0.10–4.00)

## 2018-01-19 NOTE — Progress Notes (Signed)
Subjective:    Patient ID: David Lindsey, male    DOB: [birth year], 62 y.o.   MRN: [id number]  HPI Pt is here for regular wellness examination, and is feeling pretty well in general, and says chronic med probs are stable, except as noted below Past Medical History:  Diagnosis Date  . ALCOHOLISM 12/22/2007  . ANEMIA, IRON DEFICIENCY 12/26/2007  . Arthritis    OA  . Blood transfusion without reported diagnosis    as a child  . DIABETES MELLITUS, TYPE II 11/10/2006  . ESOPHAGITIS, REFLUX 12/22/2007  . GERD (gastroesophageal reflux disease)   . HYPERCHOLESTEROLEMIA 12/22/2007  . HYPERTENSION 11/10/2006  . HYPOGONADISM 12/22/2007   Prob. due to ETOH  . HYPOKALEMIA 06/06/2008  . Leukopenia   . Neuropathy    Chronic Neuropathy BOTH FEET  . Pancytopenia 06/06/2008  . RENAL INSUFFICIENCY 11/10/2006   SEES [place] [facility] STAGE 2 CKD    Past Surgical History:  Procedure Laterality Date  . APPENDECTOMY  AGE 51 OR 10  . COLONOSCOPY  08-21-2004   [facility]- normal  . COLONSCOPY  2017  . ELECTROCARDIOGRAM  11/07/2006  . ESOPHAGOGASTRODUODENOSCOPY  08/20/2004  . EYE SURGERY Bilateral 2014   [facility] FOR CATARACTS  . REPLACEMENT TOTAL KNEE Right 2011  . UPPER GASTROINTESTINAL ENDOSCOPY      Social History   Socioeconomic History  . Marital status: Married    Spouse name: Not on file  . Number of children: Not on file  . Years of education: Not on file  . Highest education level: Not on file  Occupational History  . 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 Financial resource strain: Not on file  . Food insecurity:    Worry: Not on file    Inability: Not on file  . Transportation needs:    Medical: Not on file    Non-medical: Not on file  Tobacco Use  . Smoking status: Never Smoker  . Smokeless tobacco: Never Used  Substance and Sexual Activity  . Alcohol use: No    Alcohol/week: 0.0 standard drinks    Comment: LAST USE 10 YRS AGO  . Drug use: No  . Sexual  activity: Not on file  Lifestyle  . Physical activity:    Days per week: Not on file    Minutes per session: Not on file  . Stress: Not on file  Relationships  . Social connections:    Talks on phone: Not on file    Gets together: Not on file    Attends religious service: Not on file    Active member of club or organization: Not on file    Attends meetings of clubs or organizations: Not on file    Relationship status: Not on file  . Intimate partner violence:    Fear of current or ex partner: Not on file    Emotionally abused: Not on file    Physically abused: Not on file    Forced sexual activity: Not on file  Other Topics Concern  . Not on file  Social History Narrative  . Not on file    Current Outpatient Medications on File Prior to Visit  Medication Sig Dispense Refill  . acarbose (PRECOSE) 25 MG tablet Take 1 tablet (25 mg total) by mouth 3 (three) times daily with meals. 90 tablet 1  . acetaminophen (TYLENOL) 500 MG tablet Take 1,000 mg by mouth 2 (two) times daily as needed for moderate pain.    Marland Kitchen  APPLE CIDER VINEGAR PO Take 250 mg by mouth daily.    Marland Kitchen aspirin 81 MG tablet Take 81 mg by mouth daily.      . canagliflozin (INVOKANA) 100 MG TABS tablet Take 1 tablet (100 mg total) daily before breakfast by mouth. (Patient taking differently: Take 100 mg by mouth daily at 3 pm. ) 90 tablet 3  . Carboxymethylcellul-Glycerin (LUBRICATING EYE DROPS OP) Place 1 drop into both eyes daily as needed (dry eyes).    . clotrimazole-betamethasone (LOTRISONE) cream Apply 3x a day as needed for rash 30 g 0  . Coenzyme Q10 (COQ-10) 200 MG CAPS Take 200 mg by mouth daily.    . Cyanocobalamin (B-12) 5000 MCG CAPS Take 5,000 mcg by mouth daily.    Marland Kitchen docusate sodium (COLACE) 100 MG capsule Take 200 mg by mouth daily.    Marland Kitchen EASY TOUCH PEN NEEDLES 31G X 8 MM MISC USE ONCE DAILY WITH VICTOZA 100 each 11  . Fiber, Guar Gum, CHEW Chew 1 tablet by mouth daily.    . furosemide (LASIX) 20 MG tablet  Take 1 tablet (20 mg total) by mouth daily. 30 tablet 11  . gabapentin (NEURONTIN) 300 MG capsule TAKE 2 CAPSULES BY MOUTH 3  TIMES A DAY 540 capsule 3  . glucose blood (ONE TOUCH ULTRA TEST) test strip Use as instructed check blood sugar two times daily     . insulin lispro (HUMALOG KWIKPEN) 100 UNIT/ML KiwkPen Inject 0.1 mLs (10 Units total) into the skin 3 (three) times daily with meals. And pen needles 4/day 15 mL 11  . lisinopril-hydrochlorothiazide (PRINZIDE,ZESTORETIC) 10-12.5 MG tablet Take 0.5 tablets by mouth daily. (Patient taking differently: Take 0.5 tablets by mouth at bedtime. ) 45 tablet 3  . metoprolol succinate (TOPROL-XL) 25 MG 24 hr tablet Take 0.5 tablets (12.5 mg total) by mouth daily. (Patient taking differently: Take 12.5 mg by mouth at bedtime. ) 45 tablet 3  . Multiple Vitamin (MULTIVITAMIN WITH MINERALS) TABS tablet Take 1 tablet by mouth daily.    . Multiple Vitamins-Minerals (HAIR/SKIN/NAILS) CAPS Take 1 tablet by mouth daily.    . Omega-3-6-9 CAPS Take 1 capsule by mouth daily.    Marland Kitchen omeprazole (PRILOSEC) 20 MG capsule TAKE 1 CAPSULE BY MOUTH ONCE DAILY 90 capsule 0  . rosuvastatin (CRESTOR) 5 MG tablet Take 1 tablet (5 mg total) by mouth daily. (Patient taking differently: Take 5 mg by mouth at bedtime. ) 90 tablet 3  . sildenafil (VIAGRA) 100 MG tablet TAKE ONE TABLET BY MOUTH ONCE DAILY AS NEEDED (Patient taking differently: Take 100 mg by mouth as needed for erectile dysfunction. ) 6 tablet 29  . traZODone (DESYREL) 50 MG tablet TAKE 1 TABLET BY MOUTH AT  BEDTIME 90 tablet 3  . trolamine salicylate (ASPERCREME) 10 % cream Apply 1 application topically as needed (knee pain).    Marland Kitchen VICTOZA 18 MG/3ML SOPN INJECT 1.8MG  UNDER THE SKIN DAILY 9 pen 4  . vitamin E 400 UNIT capsule Take 400 Units by mouth daily.    Marland Kitchen ZINC-VITAMIN C PO Take 1 tablet by mouth daily.     No current facility-administered medications on file prior to visit.     Allergies  Allergen Reactions    . Pioglitazone Swelling    Family History  Problem Relation Age of Onset  . Cancer Mother        Colon Cancer  . Colon cancer Mother   . Colon polyps Brother   . Esophageal cancer Neg  Hx   . Rectal cancer Neg Hx   . Stomach cancer Neg Hx     BP 134/82 (BP Location: Right Arm, Patient Position: Sitting)   Pulse 84   Ht 5\' 11"  (1.803 m)   Wt 281 lb 8 oz (127.7 kg)   SpO2 96%   BMI 39.26 kg/m    Review of Systems Denies fever, fatigue, visual loss, hearing loss, chest pain, sob, back pain, depression, cold intolerance, BRBPR, hematuria, syncope, numbness, allergy sxs, easy bruising, and rash.     Objective:   Physical Exam VS: see vs page GEN: no distress HEAD: head: no deformity eyes: no periorbital swelling, no proptosis external nose and ears are normal mouth: no lesion seen NECK: supple, thyroid is not enlarged CHEST WALL: no deformity LUNGS: clear to auscultation CV: reg rate and rhythm, no murmur ABD: abdomen is soft, nontender.  no hepatosplenomegaly.  not distended.  no hernia MUSCULOSKELETAL: muscle bulk and strength are grossly normal.  no obvious joint swelling.  gait is normal and steady EXTEMITIES: no deformity.  no ulcer on the feet.  feet are of normal color and temp.  no edema PULSES: dorsalis pedis intact bilat.  no carotid bruit NEURO:  cn 2-12 grossly intact.   readily moves all 4's.  sensation is intact to touch on the feet SKIN:  Normal texture and temperature.  No rash or suspicious lesion is visible.   NODES:  None palpable at the neck PSYCH: alert, well-oriented.  Does not appear anxious nor depressed.        Assessment & Plan:  Wellness visit today, with problems stable, except as noted.   SEPARATE EVALUATION FOLLOWS--EACH PROBLEM HERE IS NEW, NOT RESPONDING TO TREATMENT, OR POSES SIGNIFICANT RISK TO THE PATIENT'S HEALTH: HISTORY OF THE PRESENT ILLNESS:  Pt returns for f/u of diabetes mellitus: DM type: 2 Dx'ed: 8657 Complications:  renal insufficiency Therapy: insulin since 2019, victoza, and 2 oral meds DKA: never.   Severe hypoglycemia: never.   Pancreatitis: never.   Other: he has never been on insulin; he did not tolerate parlodel (tremor); he cannot take pioglitizone (edema); he works 3rd shift.   Interval history: pt says he takes meds as rx'ed.  He will have surgery soon PAST MEDICAL HISTORY Past Medical History:  Diagnosis Date  . ALCOHOLISM 12/22/2007  . ANEMIA, IRON DEFICIENCY 12/26/2007  . Arthritis    OA  . Blood transfusion without reported diagnosis    as a child  . DIABETES MELLITUS, TYPE II 11/10/2006  . ESOPHAGITIS, REFLUX 12/22/2007  . GERD (gastroesophageal reflux disease)   . HYPERCHOLESTEROLEMIA 12/22/2007  . HYPERTENSION 11/10/2006  . HYPOGONADISM 12/22/2007   Prob. due to ETOH  . HYPOKALEMIA 06/06/2008  . Leukopenia   . Neuropathy    Chronic Neuropathy BOTH FEET  . Pancytopenia 06/06/2008  . RENAL INSUFFICIENCY 11/10/2006   SEES [place] [facility] STAGE 2 CKD    Past Surgical History:  Procedure Laterality Date  . APPENDECTOMY  AGE 1 OR 10  . COLONOSCOPY  08-21-2004   [facility]- normal  . COLONSCOPY  2017  . ELECTROCARDIOGRAM  11/07/2006  . ESOPHAGOGASTRODUODENOSCOPY  08/20/2004  . EYE SURGERY Bilateral 2014   [facility] FOR CATARACTS  . REPLACEMENT TOTAL KNEE Right 2011  . UPPER GASTROINTESTINAL ENDOSCOPY      Social History   Socioeconomic History  . Marital status: Married    Spouse name: Not on file  . Number of children: Not on file  . Years of education: Not on  file  . Highest education level: Not on file  Occupational History  . 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 Financial resource strain: Not on file  . Food insecurity:    Worry: Not on file    Inability: Not on file  . Transportation needs:    Medical: Not on file    Non-medical: Not on file  Tobacco Use  . Smoking status: Never Smoker  . Smokeless tobacco: Never Used  Substance and  Sexual Activity  . Alcohol use: No    Alcohol/week: 0.0 standard drinks    Comment: LAST USE 10 YRS AGO  . Drug use: No  . Sexual activity: Not on file  Lifestyle  . Physical activity:    Days per week: Not on file    Minutes per session: Not on file  . Stress: Not on file  Relationships  . Social connections:    Talks on phone: Not on file    Gets together: Not on file    Attends religious service: Not on file    Active member of club or organization: Not on file    Attends meetings of clubs or organizations: Not on file    Relationship status: Not on file  . Intimate partner violence:    Fear of current or ex partner: Not on file    Emotionally abused: Not on file    Physically abused: Not on file    Forced sexual activity: Not on file  Other Topics Concern  . Not on file  Social History Narrative  . Not on file    Current Outpatient Medications on File Prior to Visit  Medication Sig Dispense Refill  . acarbose (PRECOSE) 25 MG tablet Take 1 tablet (25 mg total) by mouth 3 (three) times daily with meals. 90 tablet 1  . acetaminophen (TYLENOL) 500 MG tablet Take 1,000 mg by mouth 2 (two) times daily as needed for moderate pain.    . APPLE CIDER VINEGAR PO Take 250 mg by mouth daily.    Marland Kitchen aspirin 81 MG tablet Take 81 mg by mouth daily.      . canagliflozin (INVOKANA) 100 MG TABS tablet Take 1 tablet (100 mg total) daily before breakfast by mouth. (Patient taking differently: Take 100 mg by mouth daily at 3 pm. ) 90 tablet 3  . Carboxymethylcellul-Glycerin (LUBRICATING EYE DROPS OP) Place 1 drop into both eyes daily as needed (dry eyes).    . clotrimazole-betamethasone (LOTRISONE) cream Apply 3x a day as needed for rash 30 g 0  . Coenzyme Q10 (COQ-10) 200 MG CAPS Take 200 mg by mouth daily.    . Cyanocobalamin (B-12) 5000 MCG CAPS Take 5,000 mcg by mouth daily.    Marland Kitchen docusate sodium (COLACE) 100 MG capsule Take 200 mg by mouth daily.    Marland Kitchen EASY TOUCH PEN NEEDLES 31G X 8 MM MISC  USE ONCE DAILY WITH VICTOZA 100 each 11  . Fiber, Guar Gum, CHEW Chew 1 tablet by mouth daily.    . furosemide (LASIX) 20 MG tablet Take 1 tablet (20 mg total) by mouth daily. 30 tablet 11  . gabapentin (NEURONTIN) 300 MG capsule TAKE 2 CAPSULES BY MOUTH 3  TIMES A DAY 540 capsule 3  . glucose blood (ONE TOUCH ULTRA TEST) test strip Use as instructed check blood sugar two times daily     . insulin lispro (HUMALOG KWIKPEN) 100 UNIT/ML KiwkPen Inject 0.1 mLs (10 Units total) into  the skin 3 (three) times daily with meals. And pen needles 4/day 15 mL 11  . lisinopril-hydrochlorothiazide (PRINZIDE,ZESTORETIC) 10-12.5 MG tablet Take 0.5 tablets by mouth daily. (Patient taking differently: Take 0.5 tablets by mouth at bedtime. ) 45 tablet 3  . metoprolol succinate (TOPROL-XL) 25 MG 24 hr tablet Take 0.5 tablets (12.5 mg total) by mouth daily. (Patient taking differently: Take 12.5 mg by mouth at bedtime. ) 45 tablet 3  . Multiple Vitamin (MULTIVITAMIN WITH MINERALS) TABS tablet Take 1 tablet by mouth daily.    . Multiple Vitamins-Minerals (HAIR/SKIN/NAILS) CAPS Take 1 tablet by mouth daily.    . Omega-3-6-9 CAPS Take 1 capsule by mouth daily.    Marland Kitchen omeprazole (PRILOSEC) 20 MG capsule TAKE 1 CAPSULE BY MOUTH ONCE DAILY 90 capsule 0  . rosuvastatin (CRESTOR) 5 MG tablet Take 1 tablet (5 mg total) by mouth daily. (Patient taking differently: Take 5 mg by mouth at bedtime. ) 90 tablet 3  . sildenafil (VIAGRA) 100 MG tablet TAKE ONE TABLET BY MOUTH ONCE DAILY AS NEEDED (Patient taking differently: Take 100 mg by mouth as needed for erectile dysfunction. ) 6 tablet 29  . traZODone (DESYREL) 50 MG tablet TAKE 1 TABLET BY MOUTH AT  BEDTIME 90 tablet 3  . trolamine salicylate (ASPERCREME) 10 % cream Apply 1 application topically as needed (knee pain).    Marland Kitchen VICTOZA 18 MG/3ML SOPN INJECT 1.8MG  UNDER THE SKIN DAILY 9 pen 4  . vitamin E 400 UNIT capsule Take 400 Units by mouth daily.    Marland Kitchen ZINC-VITAMIN C PO Take 1  tablet by mouth daily.     No current facility-administered medications on file prior to visit.     Allergies  Allergen Reactions  . Pioglitazone Swelling    Family History  Problem Relation Age of Onset  . Cancer Mother        Colon Cancer  . Colon cancer Mother   . Colon polyps Brother   . Esophageal cancer Neg Hx   . Rectal cancer Neg Hx   . Stomach cancer Neg Hx     BP 134/82 (BP Location: Right Arm, Patient Position: Sitting)   Pulse 84   Ht 5\' 11"  (1.803 m)   Wt 281 lb 8 oz (127.7 kg)   SpO2 96%   BMI 39.26 kg/m   REVIEW OF SYSTEMS: He denies hypoglycemia PHYSICAL EXAMINATION: VITAL SIGNS:  See vs page.  GENERAL: no distress Pulses: dorsalis pedis intact bilat.   MSK: no deformity of the feet CV: 1+ bilat leg edema, and bilat vv's Skin:  no ulcer on the feet.  normal color and temp on the feet. Neuro: sensation is intact to touch on the feet.  Ext: There is bilateral onychomycosis of the toenails.   LAB/XRAY RESULTS: Lab Results  Component Value Date   HGBA1C 7.4 (A) 01/19/2018   IMPRESSION: Type 2 DM: improved glycemic control Knee pain, to be cleared for surgery PLAN:  Please continue the same medications His surgical risk is low and outweighed by the potential benefit of the surgery.  She is therefore medically cleared

## 2018-01-19 NOTE — Patient Instructions (Addendum)
your surgical risk is low and outweighed by the potential benefit of the surgery.  You are therefore medically cleared. Please consider these measures for your health:  minimize alcohol.  Do not use tobacco products.  Have a colonoscopy at least every 10 years from age 64.  Keep firearms safely stored.  Always use seat belts.  have working smoke alarms in your home.  See an eye doctor and dentist regularly.  Never drive under the influence of alcohol or drugs (including prescription drugs).   Please come back for a follow-up appointment in 4 months, for the diabetes.

## 2018-01-27 ENCOUNTER — Other Ambulatory Visit: Payer: Self-pay | Admitting: Endocrinology

## 2018-02-09 ENCOUNTER — Other Ambulatory Visit: Payer: Self-pay | Admitting: Endocrinology

## 2018-02-10 NOTE — Patient Instructions (Addendum)
David Lindsey  02/10/2018   Your procedure is scheduled on: 02-20-18   Report to [facility] Main  Entrance    Report to admitting at 5:30AM    Call this number if you have problems the morning of surgery [phone]     Remember: Do not eat food or drink liquids :After Midnight. BRUSH YOUR TEETH MORNING OF SURGERY AND RINSE YOUR MOUTH OUT, NO CHEWING GUM CANDY OR MINTS.     Take these medicines the morning of surgery with A SIP OF WATER: GABAPENTIN , TYLENOL IF NEEDED    DIABETIC MEDICATIONS DAY OF YOUR SURGERY:  **ONLY TAKE HALF DOSE OF HUMALOG INSULIN AT DINNER TIME ON Sunday 02-19-18. ONLY TAKE HALF DOSE OF HUMALOG INSULIN ON THE DAY OF SURGERY IF YOUR BLOOD SUGAR IS GREATER THAN 220! DO NOT TAKE INVOKANA  DO NOT TAKE PRANDIN DO NOT TAKE VICTOZA                                You may not have any metal on your body including hair pins and              piercings  Do not wear jewelry, make-up, lotions, powders or perfumes, deodorant                      Men may shave face and neck.   Do not bring valuables to the hospital. [place].  Contacts, dentures or bridgework may not be worn into surgery.  Leave suitcase in the car. After surgery it may be brought to your room.                 Please read over the following fact sheets you were given: _____________________________________________________________________             [facility] - Preparing for Surgery Before surgery, you can play an important role.  Because skin is not sterile, your skin needs to be as free of germs as possible.  You can reduce the number of germs on your skin by washing with CHG (chlorahexidine gluconate) soap before surgery.  CHG is an antiseptic cleaner which kills germs and bonds with the skin to continue killing germs even after washing. Please DO NOT use if you have an allergy to CHG or antibacterial soaps.  If your  skin becomes reddened/irritated stop using the CHG and inform your nurse when you arrive at Short Stay. Do not shave (including legs and underarms) for at least 48 hours prior to the first CHG shower.  You may shave your face/neck. Please follow these instructions carefully:  1.  Shower with CHG Soap the night before surgery and the  morning of Surgery.  2.  If you choose to wash your hair, wash your hair first as usual with your  normal  shampoo.  3.  After you shampoo, rinse your hair and body thoroughly to remove the  shampoo.                           4.  Use CHG as you would any other liquid soap.  You can apply chg directly  to the skin and wash  Gently with a scrungie or clean washcloth.  5.  Apply the CHG Soap to your body ONLY FROM THE NECK DOWN.   Do not use on face/ open                           Wound or open sores. Avoid contact with eyes, ears mouth and genitals (private parts).                       Wash face,  Genitals (private parts) with your normal soap.             6.  Wash thoroughly, paying special attention to the area where your surgery  will be performed.  7.  Thoroughly rinse your body with warm water from the neck down.  8.  DO NOT shower/wash with your normal soap after using and rinsing off  the CHG Soap.                9.  Pat yourself dry with a clean towel.            10.  Wear clean pajamas.            11.  Place clean sheets on your bed the night of your first shower and do not  sleep with pets. Day of Surgery : Do not apply any lotions/deodorants the morning of surgery.  Please wear clean clothes to the hospital/surgery center.  FAILURE TO FOLLOW THESE INSTRUCTIONS MAY RESULT IN THE CANCELLATION OF YOUR SURGERY PATIENT SIGNATURE_________________________________  NURSE SIGNATURE__________________________________  ________________________________________________________________________   David Lindsey  An incentive spirometer  is a tool that can help keep your lungs clear and active. This tool measures how well you are filling your lungs with each breath. Taking long deep breaths may help reverse or decrease the chance of developing breathing (pulmonary) problems (especially infection) following:  A long period of time when you are unable to move or be active. BEFORE THE PROCEDURE   If the spirometer includes an indicator to show your best effort, your nurse or respiratory therapist will set it to a desired goal.  If possible, sit up straight or lean slightly forward. Try not to slouch.  Hold the incentive spirometer in an upright position. INSTRUCTIONS FOR USE  1. Sit on the edge of your bed if possible, or sit up as far as you can in bed or on a chair. 2. Hold the incentive spirometer in an upright position. 3. Breathe out normally. 4. Place the mouthpiece in your mouth and seal your lips tightly around it. 5. Breathe in slowly and as deeply as possible, raising the piston or the ball toward the top of the column. 6. Hold your breath for 3-5 seconds or for as long as possible. Allow the piston or ball to fall to the bottom of the column. 7. Remove the mouthpiece from your mouth and breathe out normally. 8. Rest for a few seconds and repeat Steps 1 through 7 at least 10 times every 1-2 hours when you are awake. Take your time and take a few normal breaths between deep breaths. 9. The spirometer may include an indicator to show your best effort. Use the indicator as a goal to work toward during each repetition. 10. After each set of 10 deep breaths, practice coughing to be sure your lungs are clear. If you have an incision (the cut made at the time of surgery),  support your incision when coughing by placing a pillow or rolled up towels firmly against it. Once you are able to get out of bed, walk around indoors and cough well. You may stop using the incentive spirometer when instructed by your caregiver.  RISKS AND  COMPLICATIONS  Take your time so you do not get dizzy or light-headed.  If you are in pain, you may need to take or ask for pain medication before doing incentive spirometry. It is harder to take a deep breath if you are having pain. AFTER USE  Rest and breathe slowly and easily.  It can be helpful to keep track of a log of your progress. Your caregiver can provide you with a simple table to help with this. If you are using the spirometer at home, follow these instructions: [place] IF:   You are having difficultly using the spirometer.  You have trouble using the spirometer as often as instructed.  Your pain medication is not giving enough relief while using the spirometer.  You develop fever of 100.5 F (38.1 C) or higher. SEEK IMMEDIATE MEDICAL CARE IF:   You cough up bloody sputum that had not been present before.  You develop fever of 102 F (38.9 C) or greater.  You develop worsening pain at or near the incision site. MAKE SURE YOU:   Understand these instructions.  Will watch your condition.  Will get help right away if you are not doing well or get worse. Document Released: 08/09/2006 Document Revised: 06/21/2011 Document Reviewed: 10/10/2006 [facility] Patient Information 2014 [place], [place].   ________________________________________________________________________

## 2018-02-10 NOTE — Progress Notes (Signed)
MEDICAL CLEARANCE , DR SEAN ELLISON 01-19-18 Epic   EKG 01-19-18 Epic   HGBA1C 01-19-18 Epic

## 2018-02-13 ENCOUNTER — Encounter (HOSPITAL_COMMUNITY)
Admission: RE | Admit: 2018-02-13 | Discharge: 2018-02-13 | Disposition: A | Discharge status: Home / Self Care | Payer: PRIVATE HEALTH INSURANCE | Source: Ambulatory Visit | Attending: Orthopedic Surgery | Admitting: Orthopedic Surgery

## 2018-02-13 ENCOUNTER — Other Ambulatory Visit: Payer: Self-pay

## 2018-02-13 DIAGNOSIS — Z01812 Encounter for preprocedural laboratory examination: Secondary | ICD-10-CM | POA: Insufficient documentation

## 2018-02-13 DIAGNOSIS — M1712 Unilateral primary osteoarthritis, left knee: Secondary | ICD-10-CM | POA: Insufficient documentation

## 2018-02-13 LAB — CBC WITH DIFFERENTIAL/PLATELET
Abs Immature Granulocytes: 0.01 10*3/uL (ref 0.00–0.07)
BASOS PCT: 1 %
Basophils Absolute: 0 10*3/uL (ref 0.0–0.1)
EOS ABS: 0.3 10*3/uL (ref 0.0–0.5)
EOS PCT: 7 %
HCT: 44.6 % (ref 39.0–52.0)
Hemoglobin: 14.3 g/dL (ref 13.0–17.0)
Immature Granulocytes: 0 %
Lymphocytes Relative: 38 %
Lymphs Abs: 1.8 10*3/uL (ref 0.7–4.0)
MCH: 27.7 pg (ref 26.0–34.0)
MCHC: 32.1 g/dL (ref 30.0–36.0)
MCV: 86.4 fL (ref 80.0–100.0)
MONO ABS: 0.4 10*3/uL (ref 0.1–1.0)
Monocytes Relative: 9 %
NRBC: 0 % (ref 0.0–0.2)
Neutro Abs: 2.1 10*3/uL (ref 1.7–7.7)
Neutrophils Relative %: 45 %
PLATELETS: 159 10*3/uL (ref 150–400)
RBC: 5.16 MIL/uL (ref 4.22–5.81)
RDW: 12.8 % (ref 11.5–15.5)
WBC: 4.7 10*3/uL (ref 4.0–10.5)

## 2018-02-13 LAB — URINALYSIS, ROUTINE W REFLEX MICROSCOPIC
BACTERIA UA: NONE SEEN
Bilirubin Urine: NEGATIVE
Glucose, UA: >=500 mg/dL — AB
Hgb urine dipstick: NEGATIVE
Ketones, ur: NEGATIVE mg/dL
LEUKOCYTES UA: NEGATIVE
NITRITE: NEGATIVE
PROTEIN: NEGATIVE mg/dL
Specific Gravity, Urine: 1.008 (ref 1.005–1.030)
pH: 7 (ref 5.0–8.0)

## 2018-02-13 LAB — BASIC METABOLIC PANEL
Anion gap: 8 (ref 5–15)
BUN: 21 mg/dL (ref 8–23)
CO2: 27 mmol/L (ref 22–32)
CREATININE: 2.17 mg/dL — AB (ref 0.61–1.24)
Calcium: 9.1 mg/dL (ref 8.9–10.3)
Chloride: 101 mmol/L (ref 98–111)
GFR, EST AFRICAN AMERICAN: 35 mL/min — AB (ref >60)
GFR, EST NON AFRICAN AMERICAN: 30 mL/min — AB (ref >60)
Glucose, Bld: 165 mg/dL — ABNORMAL HIGH (ref 70–99)
Potassium: 4 mmol/L (ref 3.5–5.1)
SODIUM: 136 mmol/L (ref 135–145)

## 2018-02-13 LAB — PROTIME-INR
INR: 0.93
PROTHROMBIN TIME: 12.4 s (ref 11.4–15.2)

## 2018-02-13 LAB — APTT: APTT: 34 s (ref 24–36)

## 2018-02-13 LAB — SURGICAL PCR SCREEN
MRSA, PCR: NEGATIVE
Staphylococcus aureus: NEGATIVE

## 2018-02-13 LAB — GLUCOSE, CAPILLARY: Glucose-Capillary: 154 mg/dL — ABNORMAL HIGH (ref 70–99)

## 2018-02-13 NOTE — Progress Notes (Signed)
BMP and urinalysis routed via epic to Dr Frederik Pear. RN also called and [facility] for his surgery scheduler Sandi Raveling to make aware of labs

## 2018-02-16 NOTE — H&P (Signed)
TOTAL KNEE ADMISSION H&P  Patient is being admitted for left total knee arthroplasty.  Subjective:  Chief Complaint:left knee pain.  HPI: David Lindsey, 62 y.o. male, has a history of pain and functional disability in the left knee due to arthritis and has failed non-surgical conservative treatments for greater than 12 weeks to includeNSAID's and/or analgesics, corticosteriod injections, use of assistive devices, weight reduction as appropriate and activity modification.  Onset of symptoms was gradual, starting several years ago with gradually worsening course since that time. The patient noted no past surgery on the left knee(s).  Patient currently rates pain in the left knee(s) at 10 out of 10 with activity. Patient has night pain, worsening of pain with activity and weight bearing, pain that interferes with activities of daily living, pain with passive range of motion, crepitus and joint swelling.  Patient has evidence of joint space narrowing by imaging studies.   There is no active infection.  Patient Active Problem List   Diagnosis Date Noted  . Degenerative arthritis of left knee 11/18/2017  . Left shoulder pain 08/29/2017  . History of total right knee replacement (TKR) 03/25/2017  . Arthralgia 02/24/2017  . Edema 01/13/2017  . Wellness examination 01/30/2015  . Myalgia and myositis 01/28/2014  . Unspecified constipation 03/13/2013  . Screening for prostate cancer 01/18/2013  . Diabetes mellitus with renal manifestation ([place]) 01/18/2013  . Encounter for long-term (current) use of other medications 03/24/2012  . HYPOKALEMIA 06/06/2008  . Pancytopenia ([place]) 06/06/2008  . Hypogonadism, male 12/22/2007  . HYPERCHOLESTEROLEMIA 12/22/2007  . ALCOHOLISM 12/22/2007  . ESOPHAGITIS, REFLUX 12/22/2007  . Leg pain 12/22/2007  . Essential hypertension 11/10/2006  . Disorder resulting from impaired renal function 11/10/2006   Past Medical History:  Diagnosis Date  . ALCOHOLISM 12/22/2007   . ANEMIA, IRON DEFICIENCY 12/26/2007  . Arthritis    OA  . Blood transfusion without reported diagnosis    as a child  . DIABETES MELLITUS, TYPE II 11/10/2006  . ESOPHAGITIS, REFLUX 12/22/2007  . GERD (gastroesophageal reflux disease)   . HYPERCHOLESTEROLEMIA 12/22/2007  . HYPERTENSION 11/10/2006  . HYPOGONADISM 12/22/2007   Prob. due to ETOH  . HYPOKALEMIA 06/06/2008  . Leukopenia   . Neuropathy    Chronic Neuropathy BOTH FEET  . Pancytopenia 06/06/2008  . 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 APPENDECTOMY  AGE 10 OR 10  . COLONOSCOPY  08-21-2004   [facility]- normal  . COLONSCOPY  2017  . ELECTROCARDIOGRAM  11/07/2006  . ESOPHAGOGASTRODUODENOSCOPY  08/20/2004  . EYE SURGERY Bilateral 2014   [facility] FOR CATARACTS  . REPLACEMENT TOTAL KNEE Right 2011  . UPPER GASTROINTESTINAL ENDOSCOPY      No current facility-administered medications for this encounter.    Current Outpatient Medications  Medication Sig Dispense Refill Last Dose  . acetaminophen (TYLENOL) 500 MG tablet Take 1,000 mg by mouth 2 (two) times daily as needed for moderate pain.   Taking  . Apple Cider Vinegar 600 MG CAPS Take 600 mg by mouth daily.     . Ascorbic Acid (VITAMIN C) 1000 MG tablet Take 1,000 mg by mouth every evening.     Marland Kitchen aspirin EC 81 MG tablet Take 81 mg by mouth every evening.     . Biotin w/ Vitamins C & E (HAIR SKIN & NAILS GUMMIES PO) Take 1 tablet by mouth every evening.     . canagliflozin (INVOKANA) 100  MG TABS tablet Take 1 tablet (100 mg total) daily before breakfast by mouth. (Patient taking differently: Take 100 mg by mouth at bedtime. ) 90 tablet 3 Taking  . Carboxymethylcellul-Glycerin (LUBRICATING EYE DROPS OP) Place 1 drop into both eyes daily as needed (dry eyes).   Taking  . clotrimazole-betamethasone (LOTRISONE) cream Apply 3x a day as needed for rash (Patient taking differently: Apply 1 application topically 3  (three) times daily as needed (skin irritation.). Apply 3x a day as needed for rash) 30 g 0 Taking  . Coenzyme Q10 (COQ-10) 200 MG CAPS Take 200 mg by mouth every evening.    Taking  . Cyanocobalamin (B-12) 5000 MCG CAPS Take 5,000 mcg by mouth every evening.    Taking  . docusate sodium (COLACE) 100 MG capsule Take 200 mg by mouth every evening.     . furosemide (LASIX) 20 MG tablet Take 1 tablet (20 mg total) by mouth daily. 30 tablet 11 Taking  . insulin lispro (HUMALOG KWIKPEN) 100 UNIT/ML KiwkPen Inject 0.1 mLs (10 Units total) into the skin 3 (three) times daily with meals. And pen needles 4/day 15 mL 11 Taking  . lisinopril-hydrochlorothiazide (PRINZIDE,ZESTORETIC) 10-12.5 MG tablet Take 0.5 tablets by mouth daily. (Patient taking differently: Take 0.5 tablets by mouth at bedtime. ) 45 tablet 3 Taking  . Menthol, Topical Analgesic, (STOPAIN ROLL-ON) 8 % LIQD Apply 1 application topically 3 (three) times daily as needed (for knee pain.).     Marland Kitchen Methylcellulose, Laxative, (HM FIBER) 500 MG TABS Take 500 mg by mouth 2 (two) times daily. Fiberwell Gummies     . metoprolol succinate (TOPROL-XL) 25 MG 24 hr tablet Take 0.5 tablets (12.5 mg total) by mouth daily. (Patient taking differently: Take 12.5 mg by mouth at bedtime. ) 45 tablet 3 Taking  . Multiple Vitamin (MULTIVITAMIN WITH MINERALS) TABS tablet Take 1 tablet by mouth every evening.    Taking  . Omega-3-6-9 CAPS Take 1 capsule by mouth every evening. 1600mg    Taking  . omeprazole (PRILOSEC) 20 MG capsule TAKE 1 CAPSULE BY MOUTH ONCE DAILY (Patient taking differently: Take 20 mg by mouth at bedtime. ) 90 capsule 0 Taking  . repaglinide (PRANDIN) 2 MG tablet Take 2 mg by mouth 3 (three) times daily before meals.     . rosuvastatin (CRESTOR) 5 MG tablet Take 1 tablet (5 mg total) by mouth daily. (Patient taking differently: Take 5 mg by mouth at bedtime. ) 90 tablet 3 Taking  . sildenafil (VIAGRA) 100 MG tablet TAKE ONE TABLET BY MOUTH ONCE  DAILY AS NEEDED (Patient taking differently: Take 100 mg by mouth as needed for erectile dysfunction. ) 6 tablet 29 Taking  . traZODone (DESYREL) 50 MG tablet TAKE 1 TABLET BY MOUTH AT  BEDTIME (Patient taking differently: Take 50 mg by mouth at bedtime. ) 90 tablet 3 Taking  . trolamine salicylate (ASPERCREME) 10 % cream Apply 1 application topically as needed (knee pain).   Taking  . VICTOZA 18 MG/3ML SOPN INJECT 1.8MG  UNDER THE SKIN DAILY (Patient taking differently: Inject 1.8 mg into the skin daily. ) 9 pen 4 Taking  . vitamin E 400 UNIT capsule Take 400 Units by mouth every evening.    Taking  . acarbose (PRECOSE) 25 MG tablet Take 1 tablet (25 mg total) by mouth 3 (three) times daily with meals. (Patient not taking: Reported on 02/09/2018) 90 tablet 1 Not Taking at Unknown time  . EASY TOUCH PEN NEEDLES 31G X 8 MM MISC  USE ONCE DAILY WITH VICTOZA 100 each 11 Taking  . gabapentin (NEURONTIN) 300 MG capsule Take 1 capsule (300 mg total) by mouth 3 (three) times daily. 180 capsule 0   . glucose blood (ONE TOUCH ULTRA TEST) test strip Use as instructed check blood sugar two times daily    Taking  . Multiple Vitamins-Minerals (HAIR/SKIN/NAILS) CAPS Take 1 tablet by mouth daily.   Taking   Allergies  Allergen Reactions  . Pioglitazone Swelling    Social History   Tobacco Use  . Smoking status: Never Smoker  . Smokeless tobacco: Never Used  Substance Use Topics  . Alcohol use: No    Alcohol/week: 0.0 standard drinks    Comment: LAST USE 10 YRS AGO    Family History  Problem Relation Age of Onset  . Cancer Mother        Colon Cancer  . Colon cancer Mother   . Colon polyps Brother   . Esophageal cancer Neg Hx   . Rectal cancer Neg Hx   . Stomach cancer Neg Hx      Review of Systems  Constitutional: Negative.   HENT: Negative.   Eyes: Negative.   Respiratory: Negative.   Cardiovascular:       Htn  Gastrointestinal: Negative.   Genitourinary: Negative.   Musculoskeletal:  Positive for joint pain.  Skin: Negative.   Neurological: Negative.   Endo/Heme/Allergies:       Diabetes  Psychiatric/Behavioral: Negative.     Objective:  Physical Exam  Constitutional: He is oriented to person, place, and time. He appears well-developed and well-nourished.  HENT:  Head: Normocephalic and atraumatic.  Eyes: Pupils are equal, round, and reactive to light.  Neck: Normal range of motion. Neck supple.  Cardiovascular: Intact distal pulses.  Respiratory: Effort normal.  Musculoskeletal: He exhibits tenderness.  the patient has good strength good range of motion in the right knee.  He does have a scar consistent with a right total knee arthroplasty.  Patient's left knee does have tenderness over both the medial and lateral joint lines.  Moderate crepitus with range of motion.  Mild swelling.  No erythema or warmth.  No instability with valgus or varus stress.  Patient's calves are soft and nontender.  He is neurovascularly intact distally.  Neurological: He is alert and oriented to person, place, and time.  Skin: Skin is warm and dry.  Psychiatric: He has a normal mood and affect. His behavior is normal. Judgment and thought content normal.    Vital signs in last 24 hours:    Labs:   Estimated body mass index is 38.63 kg/m as calculated from the following:   Height as of 02/13/18: 5\' 11"  (1.803 m).   Weight as of 02/13/18: 125.6 kg.   Imaging Review Plain radiographs demonstrate bilateral AP weightbearing, bilateral Rosenberg, lateral sunrise views of the left knee are taken and reviewed in office today.  Patient does have near end-stage arthritis medial compartment left knee.   Preoperative templating of the joint replacement has been completed, documented, and submitted to the Operating Room personnel in order to optimize intra-operative equipment management.    Patient's anticipated LOS is less than 2 midnights, meeting these requirements: - Younger than  39 - Lives within 1 hour of care - Has a competent adult at home to recover with post-op recover - NO history of  - Chronic pain requiring opiods  - Coronary Artery Disease  - Heart failure  - Heart attack  - Stroke  -  DVT/VTE  - Cardiac arrhythmia  - Respiratory Failure/COPD  - Renal failure  - Anemia  - Advanced Liver disease        Assessment/Plan:  End stage arthritis, left knee   The patient history, physical examination, clinical judgment of the provider and imaging studies are consistent with end stage degenerative joint disease of the left knee(s) and total knee arthroplasty is deemed medically necessary. The treatment options including medical management, injection therapy arthroscopy and arthroplasty were discussed at length. The risks and benefits of total knee arthroplasty were presented and reviewed. The risks due to aseptic loosening, infection, stiffness, patella tracking problems, thromboembolic complications and other imponderables were discussed. The patient acknowledged the explanation, agreed to proceed with the plan and consent was signed. Patient is being admitted for inpatient treatment for surgery, pain control, PT, OT, prophylactic antibiotics, VTE prophylaxis, progressive ambulation and ADL's and discharge planning. The patient is planning to be discharged home with home health services

## 2018-02-19 MED ORDER — TRANEXAMIC ACID 1000 MG/10ML IV SOLN
2000.0000 mg | INTRAVENOUS | Status: DC
  Filled 2018-02-19: qty 20

## 2018-02-19 MED ORDER — BUPIVACAINE LIPOSOME 1.3 % IJ SUSP
20.0000 mL | INTRAMUSCULAR | Status: DC
  Filled 2018-02-19: qty 20

## 2018-02-19 MED ORDER — DEXTROSE 5 % IV SOLN
3.0000 g | INTRAVENOUS | Status: AC
  Administered 2018-02-20: 3 g via INTRAVENOUS
  Filled 2018-02-19: qty 3

## 2018-02-19 NOTE — Anesthesia Preprocedure Evaluation (Addendum)
Anesthesia Evaluation  Patient identified by MRN, date of birth, ID band Patient awake    Reviewed: Allergy & Precautions, NPO status , Patient's Chart, lab work & pertinent test results  History of Anesthesia Complications Negative for: history of anesthetic complications  Airway Mallampati: II  TM Distance: >3 FB Neck ROM: Full    Dental  (+) Dental Advisory Given   Pulmonary neg pulmonary ROS,    Pulmonary exam normal        Cardiovascular hypertension, Pt. on medications and Pt. on home beta blockers Normal cardiovascular exam     Neuro/Psych negative neurological ROS  negative psych ROS   GI/Hepatic Neg liver ROS, GERD  ,  Endo/Other  diabetesMorbid obesity  Renal/GU Renal InsufficiencyRenal disease     Musculoskeletal negative musculoskeletal ROS (+)   Abdominal   Peds  Hematology negative hematology ROS (+)   Anesthesia Other Findings Day of surgery medications reviewed with the patient.  Reproductive/Obstetrics                            Anesthesia Physical Anesthesia Plan  ASA: III  Anesthesia Plan: Spinal   Post-op Pain Management:  [facility] for Post-op pain   Induction:   PONV Risk Score and Plan: 2  Airway Management Planned: Natural Airway  Additional Equipment:   Intra-op Plan:   Post-operative Plan:   Informed Consent: I have reviewed the patients History and Physical, chart, labs and discussed the procedure including the risks, benefits and alternatives for the proposed anesthesia with the patient or authorized representative who has indicated his/her understanding and acceptance.   Dental advisory given  Plan Discussed with: CRNA and Anesthesiologist  Anesthesia Plan Comments:        Anesthesia Quick Evaluation

## 2018-02-20 ENCOUNTER — Ambulatory Visit (HOSPITAL_COMMUNITY)
Admission: RE | Admit: 2018-02-20 | Discharge: 2018-02-22 | Disposition: A | Discharge status: Home Health | Payer: No Typology Code available for payment source | Source: Ambulatory Visit | Attending: Orthopedic Surgery | Admitting: Orthopedic Surgery

## 2018-02-20 ENCOUNTER — Ambulatory Visit (HOSPITAL_COMMUNITY): Payer: No Typology Code available for payment source | Admitting: Anesthesiology

## 2018-02-20 ENCOUNTER — Encounter (HOSPITAL_COMMUNITY)
Admission: RE | Disposition: A | Discharge status: Home Health | Payer: Self-pay | Source: Ambulatory Visit | Attending: Orthopedic Surgery

## 2018-02-20 ENCOUNTER — Other Ambulatory Visit: Payer: Self-pay

## 2018-02-20 ENCOUNTER — Encounter (HOSPITAL_COMMUNITY): Payer: Self-pay

## 2018-02-20 DIAGNOSIS — Z96651 Presence of right artificial knee joint: Secondary | ICD-10-CM | POA: Diagnosis not present

## 2018-02-20 DIAGNOSIS — D62 Acute posthemorrhagic anemia: Secondary | ICD-10-CM | POA: Diagnosis not present

## 2018-02-20 DIAGNOSIS — Z96652 Presence of left artificial knee joint: Secondary | ICD-10-CM

## 2018-02-20 DIAGNOSIS — I129 Hypertensive chronic kidney disease with stage 1 through stage 4 chronic kidney disease, or unspecified chronic kidney disease: Secondary | ICD-10-CM | POA: Insufficient documentation

## 2018-02-20 DIAGNOSIS — Z794 Long term (current) use of insulin: Secondary | ICD-10-CM | POA: Insufficient documentation

## 2018-02-20 DIAGNOSIS — E1122 Type 2 diabetes mellitus with diabetic chronic kidney disease: Secondary | ICD-10-CM | POA: Diagnosis not present

## 2018-02-20 DIAGNOSIS — Z7982 Long term (current) use of aspirin: Secondary | ICD-10-CM | POA: Diagnosis not present

## 2018-02-20 DIAGNOSIS — Z6839 Body mass index (BMI) 39.0-39.9, adult: Secondary | ICD-10-CM | POA: Insufficient documentation

## 2018-02-20 DIAGNOSIS — Z79899 Other long term (current) drug therapy: Secondary | ICD-10-CM | POA: Insufficient documentation

## 2018-02-20 DIAGNOSIS — E1140 Type 2 diabetes mellitus with diabetic neuropathy, unspecified: Secondary | ICD-10-CM | POA: Insufficient documentation

## 2018-02-20 DIAGNOSIS — G8929 Other chronic pain: Secondary | ICD-10-CM | POA: Diagnosis not present

## 2018-02-20 DIAGNOSIS — M1712 Unilateral primary osteoarthritis, left knee: Secondary | ICD-10-CM | POA: Insufficient documentation

## 2018-02-20 DIAGNOSIS — N182 Chronic kidney disease, stage 2 (mild): Secondary | ICD-10-CM | POA: Insufficient documentation

## 2018-02-20 DIAGNOSIS — E669 Obesity, unspecified: Secondary | ICD-10-CM | POA: Diagnosis not present

## 2018-02-20 HISTORY — PX: TOTAL KNEE ARTHROPLASTY: SHX125

## 2018-02-20 LAB — GLUCOSE, CAPILLARY
GLUCOSE-CAPILLARY: 158 mg/dL — AB (ref 70–99)
Glucose-Capillary: 116 mg/dL — ABNORMAL HIGH (ref 70–99)
Glucose-Capillary: 125 mg/dL — ABNORMAL HIGH (ref 70–99)
Glucose-Capillary: 153 mg/dL — ABNORMAL HIGH (ref 70–99)
Glucose-Capillary: 122 mg/dL — ABNORMAL HIGH (ref 70–99)

## 2018-02-20 LAB — TYPE AND SCREEN
ABO/RH(D): O POS
Antibody Screen: NEGATIVE

## 2018-02-20 LAB — HEMOGLOBIN A1C
Hgb A1c MFr Bld: 7.1 % — ABNORMAL HIGH (ref 4.8–5.6)
Mean Plasma Glucose: 157.07 mg/dL

## 2018-02-20 SURGERY — ARTHROPLASTY, KNEE, TOTAL
Anesthesia: Spinal | Site: Knee | Laterality: Left

## 2018-02-20 MED ORDER — TRAZODONE HCL 50 MG PO TABS
50.0000 mg | ORAL_TABLET | Freq: Every day | ORAL | Status: DC
  Administered 2018-02-20 – 2018-02-21 (×2): 50 mg via ORAL
  Filled 2018-02-20 (×2): qty 1

## 2018-02-20 MED ORDER — CARBOXYMETHYLCELLUL-GLYCERIN 0.5-0.9 % OP SOLN: 1.0000 [drp] | Freq: Every day | OPHTHALMIC | Status: DC | PRN

## 2018-02-20 MED ORDER — KCL IN DEXTROSE-NACL 20-5-0.45 MEQ/L-%-% IV SOLN
INTRAVENOUS | Status: DC
  Administered 2018-02-20 – 2018-02-21 (×3): via INTRAVENOUS
  Filled 2018-02-20 (×6): qty 1000

## 2018-02-20 MED ORDER — INSULIN ASPART 100 UNIT/ML ~~LOC~~ SOLN
0.0000 [IU] | Freq: Three times a day (TID) | SUBCUTANEOUS | Status: DC
  Administered 2018-02-20: 3 [IU] via SUBCUTANEOUS
  Administered 2018-02-20: 2 [IU] via SUBCUTANEOUS
  Administered 2018-02-21 (×3): 3 [IU] via SUBCUTANEOUS

## 2018-02-20 MED ORDER — LISINOPRIL 10 MG PO TABS
10.0000 mg | ORAL_TABLET | Freq: Every day | ORAL | Status: DC
  Administered 2018-02-21 – 2018-02-22 (×2): 10 mg via ORAL
  Filled 2018-02-20 (×2): qty 1

## 2018-02-20 MED ORDER — TRANEXAMIC ACID-NACL 1000-0.7 MG/100ML-% IV SOLN: 1000.0000 mg | INTRAVENOUS | Status: DC

## 2018-02-20 MED ORDER — REPAGLINIDE 2 MG PO TABS
2.0000 mg | ORAL_TABLET | Freq: Three times a day (TID) | ORAL | Status: DC
  Administered 2018-02-20 – 2018-02-22 (×5): 2 mg via ORAL
  Filled 2018-02-20 (×6): qty 1

## 2018-02-20 MED ORDER — ONDANSETRON HCL 4 MG/2ML IJ SOLN: 4.0000 mg | Freq: Four times a day (QID) | INTRAMUSCULAR | Status: DC | PRN

## 2018-02-20 MED ORDER — FENTANYL CITRATE (PF) 100 MCG/2ML IJ SOLN
INTRAMUSCULAR | Status: AC
  Filled 2018-02-20: qty 2

## 2018-02-20 MED ORDER — REPAGLINIDE 1 MG PO TABS
2.0000 mg | ORAL_TABLET | Freq: Three times a day (TID) | ORAL | Status: DC
  Filled 2018-02-20: qty 2

## 2018-02-20 MED ORDER — LISINOPRIL-HYDROCHLOROTHIAZIDE 10-12.5 MG PO TABS: 0.5000 | ORAL_TABLET | Freq: Every day | ORAL | Status: DC

## 2018-02-20 MED ORDER — SODIUM CHLORIDE (PF) 0.9 % IJ SOLN
INTRAMUSCULAR | Status: DC | PRN
  Administered 2018-02-20: 50 mL

## 2018-02-20 MED ORDER — LACTATED RINGERS IV SOLN
INTRAVENOUS | Status: DC
  Administered 2018-02-20: 08:00:00 via INTRAVENOUS
  Administered 2018-02-20: 1000 mL via INTRAVENOUS

## 2018-02-20 MED ORDER — HYDROCHLOROTHIAZIDE 12.5 MG PO CAPS
12.5000 mg | ORAL_CAPSULE | Freq: Every day | ORAL | Status: DC
  Administered 2018-02-21 – 2018-02-22 (×2): 12.5 mg via ORAL
  Filled 2018-02-20 (×2): qty 1

## 2018-02-20 MED ORDER — PANTOPRAZOLE SODIUM 40 MG PO TBEC
40.0000 mg | DELAYED_RELEASE_TABLET | Freq: Every day | ORAL | Status: DC
  Administered 2018-02-20 – 2018-02-22 (×3): 40 mg via ORAL
  Filled 2018-02-20 (×3): qty 1

## 2018-02-20 MED ORDER — CANAGLIFLOZIN 100 MG PO TABS
100.0000 mg | ORAL_TABLET | Freq: Every day | ORAL | Status: DC
  Administered 2018-02-20: 100 mg via ORAL
  Filled 2018-02-20 (×2): qty 1

## 2018-02-20 MED ORDER — ROSUVASTATIN CALCIUM 5 MG PO TABS
5.0000 mg | ORAL_TABLET | Freq: Every day | ORAL | Status: DC
  Administered 2018-02-20 – 2018-02-21 (×2): 5 mg via ORAL
  Filled 2018-02-20 (×2): qty 1

## 2018-02-20 MED ORDER — PROMETHAZINE HCL 25 MG/ML IJ SOLN: 6.2500 mg | INTRAMUSCULAR | Status: DC | PRN

## 2018-02-20 MED ORDER — LIRAGLUTIDE 18 MG/3ML ~~LOC~~ SOPN
1.8000 mg | PEN_INJECTOR | Freq: Every day | SUBCUTANEOUS | Status: DC
  Administered 2018-02-21 – 2018-02-22 (×2): 1.8 mg via SUBCUTANEOUS

## 2018-02-20 MED ORDER — BUPIVACAINE LIPOSOME 1.3 % IJ SUSP
INTRAMUSCULAR | Status: DC | PRN
  Administered 2018-02-20: 20 mL

## 2018-02-20 MED ORDER — ONDANSETRON HCL 4 MG/2ML IJ SOLN
INTRAMUSCULAR | Status: DC | PRN
  Administered 2018-02-20: 4 mg via INTRAVENOUS

## 2018-02-20 MED ORDER — METOCLOPRAMIDE HCL 5 MG/ML IJ SOLN: 5.0000 mg | Freq: Three times a day (TID) | INTRAMUSCULAR | Status: DC | PRN

## 2018-02-20 MED ORDER — DIPHENHYDRAMINE HCL 12.5 MG/5ML PO ELIX: 12.5000 mg | ORAL_SOLUTION | ORAL | Status: DC | PRN

## 2018-02-20 MED ORDER — PANTOPRAZOLE SODIUM 40 MG PO TBEC: 80.0000 mg | DELAYED_RELEASE_TABLET | Freq: Every day | ORAL | Status: DC

## 2018-02-20 MED ORDER — FLEET ENEMA 7-19 GM/118ML RE ENEM: 1.0000 | ENEMA | Freq: Once | RECTAL | Status: DC | PRN

## 2018-02-20 MED ORDER — BISACODYL 5 MG PO TBEC: 5.0000 mg | DELAYED_RELEASE_TABLET | Freq: Every day | ORAL | Status: DC | PRN

## 2018-02-20 MED ORDER — ALUM & MAG HYDROXIDE-SIMETH 200-200-20 MG/5ML PO SUSP: 30.0000 mL | ORAL | Status: DC | PRN

## 2018-02-20 MED ORDER — GABAPENTIN 300 MG PO CAPS
300.0000 mg | ORAL_CAPSULE | Freq: Three times a day (TID) | ORAL | Status: DC
  Administered 2018-02-20 – 2018-02-22 (×6): 300 mg via ORAL
  Filled 2018-02-20 (×6): qty 1

## 2018-02-20 MED ORDER — PROPOFOL 10 MG/ML IV BOLUS
INTRAVENOUS | Status: AC
  Filled 2018-02-20: qty 40

## 2018-02-20 MED ORDER — 0.9 % SODIUM CHLORIDE (POUR BTL) OPTIME
TOPICAL | Status: DC | PRN
  Administered 2018-02-20: 1000 mL

## 2018-02-20 MED ORDER — OXYCODONE-ACETAMINOPHEN 5-325 MG PO TABS: 1.0000 | ORAL_TABLET | ORAL | 0 refills | Status: DC | PRN

## 2018-02-20 MED ORDER — PROPOFOL 500 MG/50ML IV EMUL
INTRAVENOUS | Status: DC | PRN
  Administered 2018-02-20: 75 ug/kg/min via INTRAVENOUS

## 2018-02-20 MED ORDER — TRANEXAMIC ACID 1000 MG/10ML IV SOLN
INTRAVENOUS | Status: DC | PRN
  Administered 2018-02-20: 2000 mg via TOPICAL

## 2018-02-20 MED ORDER — SODIUM CHLORIDE 0.9 % IV SOLN
INTRAVENOUS | Status: DC | PRN
  Administered 2018-02-20: 40 ug/min via INTRAVENOUS

## 2018-02-20 MED ORDER — TRANEXAMIC ACID-NACL 1000-0.7 MG/100ML-% IV SOLN
1000.0000 mg | INTRAVENOUS | Status: AC
  Administered 2018-02-20: 1000 mg via INTRAVENOUS

## 2018-02-20 MED ORDER — TIZANIDINE HCL 2 MG PO TABS: 2.0000 mg | ORAL_TABLET | Freq: Four times a day (QID) | ORAL | 0 refills | Status: DC | PRN

## 2018-02-20 MED ORDER — POLYETHYLENE GLYCOL 3350 17 G PO PACK: 17.0000 g | PACK | Freq: Every day | ORAL | Status: DC | PRN

## 2018-02-20 MED ORDER — MIDAZOLAM HCL 2 MG/2ML IJ SOLN: 1.0000 mg | INTRAMUSCULAR | Status: DC | PRN

## 2018-02-20 MED ORDER — METOPROLOL SUCCINATE ER 25 MG PO TB24
12.5000 mg | ORAL_TABLET | Freq: Every day | ORAL | Status: DC
  Administered 2018-02-20 – 2018-02-21 (×2): 12.5 mg via ORAL
  Filled 2018-02-20 (×2): qty 1

## 2018-02-20 MED ORDER — INSULIN LISPRO 100 UNIT/ML (KWIKPEN): 10.0000 [IU] | PEN_INJECTOR | Freq: Three times a day (TID) | SUBCUTANEOUS | Status: DC

## 2018-02-20 MED ORDER — DOCUSATE SODIUM 100 MG PO CAPS
100.0000 mg | ORAL_CAPSULE | Freq: Two times a day (BID) | ORAL | Status: DC
  Administered 2018-02-20 – 2018-02-22 (×5): 100 mg via ORAL
  Filled 2018-02-20 (×6): qty 1

## 2018-02-20 MED ORDER — PROPOFOL 10 MG/ML IV BOLUS
INTRAVENOUS | Status: AC
  Filled 2018-02-20: qty 60

## 2018-02-20 MED ORDER — METHOCARBAMOL 500 MG PO TABS
500.0000 mg | ORAL_TABLET | Freq: Four times a day (QID) | ORAL | Status: DC | PRN
  Administered 2018-02-20 – 2018-02-21 (×5): 500 mg via ORAL
  Filled 2018-02-20 (×5): qty 1

## 2018-02-20 MED ORDER — METOCLOPRAMIDE HCL 5 MG PO TABS: 5.0000 mg | ORAL_TABLET | Freq: Three times a day (TID) | ORAL | Status: DC | PRN

## 2018-02-20 MED ORDER — HYDROMORPHONE HCL 1 MG/ML IJ SOLN
0.5000 mg | INTRAMUSCULAR | Status: DC | PRN
  Administered 2018-02-20 – 2018-02-21 (×6): 1 mg via INTRAVENOUS
  Filled 2018-02-20 (×6): qty 1

## 2018-02-20 MED ORDER — PHENOL 1.4 % MT LIQD: 1.0000 | OROMUCOSAL | Status: DC | PRN

## 2018-02-20 MED ORDER — ONDANSETRON HCL 4 MG/2ML IJ SOLN
INTRAMUSCULAR | Status: AC
  Filled 2018-02-20: qty 2

## 2018-02-20 MED ORDER — CLOTRIMAZOLE 1 % EX CREA
TOPICAL_CREAM | Freq: Two times a day (BID) | CUTANEOUS | Status: DC
  Administered 2018-02-22: 10:00:00 via TOPICAL
  Filled 2018-02-20: qty 15

## 2018-02-20 MED ORDER — METHOCARBAMOL 500 MG IVPB - SIMPLE MED
500.0000 mg | Freq: Four times a day (QID) | INTRAVENOUS | Status: DC | PRN
  Filled 2018-02-20: qty 50

## 2018-02-20 MED ORDER — BUPIVACAINE HCL (PF) 0.25 % IJ SOLN
INTRAMUSCULAR | Status: DC | PRN
  Administered 2018-02-20: 30 mL

## 2018-02-20 MED ORDER — ROPIVACAINE HCL 7.5 MG/ML IJ SOLN
INTRAMUSCULAR | Status: DC | PRN
  Administered 2018-02-20: 20 mL via PERINEURAL

## 2018-02-20 MED ORDER — MENTHOL 3 MG MT LOZG: 1.0000 | LOZENGE | OROMUCOSAL | Status: DC | PRN

## 2018-02-20 MED ORDER — ACETAMINOPHEN 325 MG PO TABS
325.0000 mg | ORAL_TABLET | Freq: Four times a day (QID) | ORAL | Status: DC | PRN
  Administered 2018-02-21: 650 mg via ORAL
  Filled 2018-02-20: qty 2

## 2018-02-20 MED ORDER — BUPIVACAINE LIPOSOME 1.3 % IJ SUSP: 20.0000 mL | Freq: Once | INTRAMUSCULAR | Status: DC

## 2018-02-20 MED ORDER — OXYCODONE HCL 5 MG PO TABS
5.0000 mg | ORAL_TABLET | ORAL | Status: DC | PRN
  Administered 2018-02-20: 5 mg via ORAL
  Administered 2018-02-20: 10 mg via ORAL
  Administered 2018-02-20: 5 mg via ORAL
  Administered 2018-02-20 – 2018-02-21 (×3): 10 mg via ORAL
  Filled 2018-02-20: qty 1
  Filled 2018-02-20 (×2): qty 2
  Filled 2018-02-20: qty 1
  Filled 2018-02-20 (×2): qty 2

## 2018-02-20 MED ORDER — SODIUM CHLORIDE 0.9 % IR SOLN
Status: DC | PRN
  Administered 2018-02-20: 1000 mL

## 2018-02-20 MED ORDER — SODIUM CHLORIDE 0.9 % IV SOLN: 30.0000 ug/min | INTRAVENOUS | Status: DC

## 2018-02-20 MED ORDER — SODIUM CHLORIDE (PF) 0.9 % IJ SOLN
INTRAMUSCULAR | Status: AC
  Filled 2018-02-20: qty 50

## 2018-02-20 MED ORDER — ONDANSETRON HCL 4 MG PO TABS: 4.0000 mg | ORAL_TABLET | Freq: Four times a day (QID) | ORAL | Status: DC | PRN

## 2018-02-20 MED ORDER — FENTANYL CITRATE (PF) 100 MCG/2ML IJ SOLN: 25.0000 ug | INTRAMUSCULAR | Status: DC | PRN

## 2018-02-20 MED ORDER — PROPOFOL 10 MG/ML IV BOLUS
INTRAVENOUS | Status: AC
  Filled 2018-02-20: qty 20

## 2018-02-20 MED ORDER — BUPIVACAINE IN DEXTROSE 0.75-8.25 % IT SOLN
INTRATHECAL | Status: DC | PRN
  Administered 2018-02-20: 1.8 mL via INTRATHECAL

## 2018-02-20 MED ORDER — PROPOFOL 10 MG/ML IV BOLUS
INTRAVENOUS | Status: DC | PRN
  Administered 2018-02-20: 20 mg via INTRAVENOUS
  Administered 2018-02-20: 30 mg via INTRAVENOUS

## 2018-02-20 MED ORDER — TRANEXAMIC ACID-NACL 1000-0.7 MG/100ML-% IV SOLN
1000.0000 mg | Freq: Once | INTRAVENOUS | Status: AC
  Administered 2018-02-20: 1000 mg via INTRAVENOUS
  Filled 2018-02-20: qty 100

## 2018-02-20 MED ORDER — WATER FOR IRRIGATION, STERILE IR SOLN
Status: DC | PRN
  Administered 2018-02-20: 2000 mL

## 2018-02-20 MED ORDER — FUROSEMIDE 20 MG PO TABS
20.0000 mg | ORAL_TABLET | Freq: Every day | ORAL | Status: DC
  Administered 2018-02-20 – 2018-02-22 (×3): 20 mg via ORAL
  Filled 2018-02-20 (×4): qty 1

## 2018-02-20 MED ORDER — FENTANYL CITRATE (PF) 100 MCG/2ML IJ SOLN
INTRAMUSCULAR | Status: DC | PRN
  Administered 2018-02-20 (×2): 50 ug via INTRAVENOUS

## 2018-02-20 MED ORDER — BUPIVACAINE HCL (PF) 0.25 % IJ SOLN
INTRAMUSCULAR | Status: AC
  Filled 2018-02-20: qty 30

## 2018-02-20 MED ORDER — FENTANYL CITRATE (PF) 100 MCG/2ML IJ SOLN: 50.0000 ug | INTRAMUSCULAR | Status: DC | PRN

## 2018-02-20 MED ORDER — ASPIRIN EC 81 MG PO TBEC: 81.0000 mg | DELAYED_RELEASE_TABLET | Freq: Two times a day (BID) | ORAL | 0 refills | Status: DC

## 2018-02-20 MED ORDER — INSULIN ASPART 100 UNIT/ML ~~LOC~~ SOLN
10.0000 [IU] | Freq: Three times a day (TID) | SUBCUTANEOUS | Status: DC
  Administered 2018-02-20 – 2018-02-22 (×5): 10 [IU] via SUBCUTANEOUS

## 2018-02-20 MED ORDER — BUPIVACAINE HCL (PF) 0.5 % IJ SOLN
INTRAMUSCULAR | Status: AC
  Filled 2018-02-20: qty 30

## 2018-02-20 MED ORDER — TRANEXAMIC ACID-NACL 1000-0.7 MG/100ML-% IV SOLN
INTRAVENOUS | Status: AC
  Filled 2018-02-20: qty 100

## 2018-02-20 MED ORDER — DEXAMETHASONE SODIUM PHOSPHATE 10 MG/ML IJ SOLN
10.0000 mg | Freq: Once | INTRAMUSCULAR | Status: AC
  Administered 2018-02-21: 10 mg via INTRAVENOUS
  Filled 2018-02-20: qty 1

## 2018-02-20 MED ORDER — ASPIRIN 81 MG PO CHEW
81.0000 mg | CHEWABLE_TABLET | Freq: Two times a day (BID) | ORAL | Status: DC
  Administered 2018-02-20 – 2018-02-22 (×4): 81 mg via ORAL
  Filled 2018-02-20 (×4): qty 1

## 2018-02-20 SURGICAL SUPPLY — 53 items
ATTUNE MED DOME PAT 41 KNEE (Knees) ×1 IMPLANT
ATTUNE PS FEM LT SZ 8 CEM KNEE (Femur) ×1 IMPLANT
ATTUNE PSRP INSR SZ8 5 KNEE (Insert) ×1 IMPLANT
BAG DECANTER FOR FLEXI CONT (MISCELLANEOUS) ×2 IMPLANT
BAG SPEC THK2 15X12 ZIP CLS (MISCELLANEOUS) ×1
BAG ZIPLOCK 12X15 (MISCELLANEOUS) ×2 IMPLANT
BASE TIBIAL ROT PLAT SZ 8 KNEE (Knees) IMPLANT
BLADE SAG 18X100X1.27 (BLADE) ×2 IMPLANT
BLADE SAW SGTL 11.0X1.19X90.0M (BLADE) IMPLANT
BNDG CMPR MED 10X6 ELC LF (GAUZE/BANDAGES/DRESSINGS) ×1
BNDG ELASTIC 6X10 VLCR STRL LF (GAUZE/BANDAGES/DRESSINGS) ×2 IMPLANT
BOWL SMART MIX CTS (DISPOSABLE) ×2 IMPLANT
BSPLAT TIB 8 CMNT ROT PLAT STR (Knees) ×1 IMPLANT
CEMENT HV SMART SET (Cement) ×4 IMPLANT
COVER SURGICAL LIGHT HANDLE (MISCELLANEOUS) ×2 IMPLANT
COVER WAND RF STERILE (DRAPES) IMPLANT
CUFF TOURN SGL QUICK 34 (TOURNIQUET CUFF) ×2
CUFF TRNQT CYL 34X4X40X1 (TOURNIQUET CUFF) ×1 IMPLANT
DECANTER SPIKE VIAL GLASS SM (MISCELLANEOUS) ×6 IMPLANT
DRAPE U-SHAPE 47X51 STRL (DRAPES) ×2 IMPLANT
DRSG AQUACEL AG ADV 3.5X10 (GAUZE/BANDAGES/DRESSINGS) ×2 IMPLANT
DURAPREP 26ML APPLICATOR (WOUND CARE) ×2 IMPLANT
ELECT REM PT RETURN 15FT ADLT (MISCELLANEOUS) ×2 IMPLANT
GLOVE BIO SURGEON STRL SZ7.5 (GLOVE) ×2 IMPLANT
GLOVE BIO SURGEON STRL SZ8.5 (GLOVE) ×2 IMPLANT
GLOVE BIOGEL PI IND STRL 8 (GLOVE) ×1 IMPLANT
GLOVE BIOGEL PI IND STRL 9 (GLOVE) ×1 IMPLANT
GLOVE BIOGEL PI INDICATOR 8 (GLOVE) ×1
GLOVE BIOGEL PI INDICATOR 9 (GLOVE) ×1
GOWN STRL REUS W/TWL XL LVL3 (GOWN DISPOSABLE) ×4 IMPLANT
HANDPIECE INTERPULSE COAX TIP (DISPOSABLE) ×2
HOOD PEEL AWAY FLYTE STAYCOOL (MISCELLANEOUS) ×6 IMPLANT
IV KIT MINILOC 20X1 SAFETY (NEEDLE) ×2 IMPLANT
NS IRRIG 1000ML POUR BTL (IV SOLUTION) ×2 IMPLANT
PACK ICE MAXI GEL EZY WRAP (MISCELLANEOUS) ×2 IMPLANT
PACK TOTAL KNEE CUSTOM (KITS) ×2 IMPLANT
PIN STEINMAN FIXATION KNEE (PIN) ×1 IMPLANT
PIN THREADED HEADED SIGMA (PIN) ×1 IMPLANT
POSITIONER SURGICAL ARM (MISCELLANEOUS) ×2 IMPLANT
SET HNDPC FAN SPRY TIP SCT (DISPOSABLE) ×1 IMPLANT
STAPLER VISISTAT 35W (STAPLE) IMPLANT
SUT VIC AB 1 CTX 36 (SUTURE) ×2
SUT VIC AB 1 CTX36XBRD ANBCTR (SUTURE) ×1 IMPLANT
SUT VIC AB 2-0 CT1 27 (SUTURE) ×2
SUT VIC AB 2-0 CT1 TAPERPNT 27 (SUTURE) ×1 IMPLANT
SUT VIC AB 3-0 CT1 27 (SUTURE) ×2
SUT VIC AB 3-0 CT1 TAPERPNT 27 (SUTURE) ×1 IMPLANT
SYR CONTROL 10ML LL (SYRINGE) ×4 IMPLANT
TIBIAL BASE ROT PLAT SZ 8 KNEE (Knees) ×2 IMPLANT
TRAY FOLEY MTR SLVR 16FR STAT (SET/KITS/TRAYS/PACK) ×2 IMPLANT
WATER STERILE IRR 1000ML POUR (IV SOLUTION) ×4 IMPLANT
WRAP KNEE MAXI GEL POST OP (GAUZE/BANDAGES/DRESSINGS) ×1 IMPLANT
YANKAUER SUCT BULB TIP 10FT TU (MISCELLANEOUS) ×2 IMPLANT

## 2018-02-20 NOTE — Interval H&P Note (Signed)
History and Physical Interval Note:  02/20/2018 7:15 AM  David Lindsey  has presented today for surgery, with the diagnosis of LEFT KNEE OSTEOARTHRITIS  The various methods of treatment have been discussed with the patient and family. After consideration of risks, benefits and other options for treatment, the patient has consented to  Procedure(s): LEFT TOTAL KNEE ARTHROPLASTY (Left) as a surgical intervention .  The patient's history has been reviewed, patient examined, no change in status, stable for surgery.  I have reviewed the patient's chart and labs.  Questions were answered to the patient's satisfaction.     [name]

## 2018-02-20 NOTE — Transfer of Care (Signed)
Immediate Anesthesia Transfer of Care Note  Patient: David Lindsey  Procedure(s) Performed: Procedure(s): LEFT TOTAL KNEE ARTHROPLASTY (Left)  Patient Location: PACU  Anesthesia Type:General  Level of Consciousness:  sedated, patient cooperative and responds to stimulation  Airway & Oxygen Therapy:Patient Spontanous Breathing and Patient connected to face mask oxgen  Post-op Assessment:  Report given to PACU RN and Post -op Vital signs reviewed and stable  Post vital signs:  Reviewed and stable  Last Vitals:  Vitals:   02/20/18 0618 02/20/18 0906  BP: 122/74 (!) 92/56  Pulse: 86 84  Resp: 13 12  Temp: 36.8 C (P) 36.4 C  SpO2: 34% 193%    Complications: No apparent anesthesia complications

## 2018-02-20 NOTE — Anesthesia Postprocedure Evaluation (Signed)
Anesthesia Post Note  Patient: David Lindsey  Procedure(s) Performed: LEFT TOTAL KNEE ARTHROPLASTY (Left Knee)     Patient location during evaluation: PACU Anesthesia Type: Spinal Level of consciousness: awake and alert Pain management: pain level controlled Vital Signs Assessment: post-procedure vital signs reviewed and stable Respiratory status: spontaneous breathing and respiratory function stable Cardiovascular status: blood pressure returned to baseline and stable Postop Assessment: spinal receding Anesthetic complications: no    Last Vitals:  Vitals:   02/20/18 1053 02/20/18 1207  BP: 117/72 116/75  Pulse: 64 74  Resp: 16 14  Temp: (!) 36.4 C (!) 36.3 C  SpO2: 100% 100%    Last Pain:  Vitals:   02/20/18 1207  TempSrc: Oral  PainSc:                  [name] DANIEL

## 2018-02-20 NOTE — Progress Notes (Signed)
[facility]  Patient Status: New  [facility] is providing the following services: PT  If patient discharges after hours, please call [phone].   David Lindsey 02/20/2018, 12:42 PM

## 2018-02-20 NOTE — Op Note (Signed)
PATIENT ID:      David Lindsey  MRN:     [id number] DOB/AGE:    [birth year] / 62 y.o.       OPERATIVE REPORT    DATE OF PROCEDURE:  02/20/2018       PREOPERATIVE DIAGNOSIS:   LEFT KNEE OSTEOARTHRITIS      Estimated body mass index is 39.05 kg/m as calculated from the following:   Height as of this encounter: 5\' 11"  (1.803 m).   Weight as of this encounter: 127 kg.                                                        POSTOPERATIVE DIAGNOSIS:   LEFT KNEE OSTEOARTHRITIS                                                                      PROCEDURE:  Procedure(s): LEFT TOTAL KNEE ARTHROPLASTY Using DepuyAttune RP implants #8L Femur, #8Tibia, 5 mm Attune RP bearing, 41 Patella     SURGEON: [name]    ASSISTANT:   Kerry Hough. [employer]   (Present and scrubbed throughout the case, critical for assistance with exposure, retraction, instrumentation, and closure.)         ANESTHESIA: Spinal, 20cc Exparel, 50cc 0.25% Marcaine  EBL: 300 cc  FLUID REPLACEMENT: 1600 cc crystaloid  Tourniquet Time: None  Drains: None  Tranexamic Acid: 1gm IV, 2gm topical  Exparel: 266mg    COMPLICATIONS:  None         INDICATIONS FOR PROCEDURE: The patient has  LEFT KNEE OSTEOARTHRITIS, Var deformities, XR shows bone on bone arthritis, lateral subluxation of tibia. Patient has failed all conservative measures including anti-inflammatory medicines, narcotics, attempts at exercise and weight loss, cortisone injections and viscosupplementation.  Risks and benefits of surgery have been discussed, questions answered.   DESCRIPTION OF PROCEDURE: The patient identified by armband, received  IV antibiotics, in the holding area at [facility]. Patient taken to the operating room, appropriate anesthetic monitors were attached, and Spinal anesthesia was  induced. IV Tranexamic acid was given.Tourniquet applied high to the operative thigh. Lateral post and foot positioner applied to the table, the lower extremity  was then prepped and draped in usual sterile fashion from the toes to the tourniquet. Time-out procedure was performed. The skin and subcutaneous tissue along the incision was injected with 20 cc of a mixture of Exparel and Marcaine solution, using a 20-gauge by 1-1/2 inch needle. We began the operation, with the knee flexed 130 degrees, by making the anterior midline incision starting at handbreadth above the patella going over the patella 1 cm medial to and 4 cm distal to the tibial tubercle. Small bleeders in the skin and the subcutaneous tissue identified and cauterized. Transverse retinaculum was incised and reflected medially and a medial parapatellar arthrotomy was accomplished. the patella was everted and theprepatellar fat pad resected. The superficial medial collateral ligament was then elevated from anterior to posterior along the proximal flare of the tibia and anterior half of the menisci resected. The knee was hyperflexed exposing bone  on bone arthritis. Peripheral and notch osteophytes as well as the cruciate ligaments were then resected. We continued to work our way around posteriorly along the proximal tibia, and externally rotated the tibia subluxing it out from underneath the femur. A McHale retractor was placed through the notch and a lateral Hohmann retractor placed, and we then drilled through the proximal tibia in line with the axis of the tibia followed by an intramedullary guide rod and 2-degree posterior slope cutting guide. The tibial cutting guide, 4 degree posterior sloped, was pinned into place allowing resection of 5 mm of bone medially and 11 mm of bone laterally. Satisfied with the tibial resection, we then entered the distal femur 2 mm anterior to the PCL origin with the intramedullary guide rod and applied the distal femoral cutting guide set at 9 mm, with 5 degrees of valgus. This was pinned along the epicondylar axis. At this point, the distal femoral cut was accomplished without  difficulty. We then sized for a #8L femoral component and pinned the guide in 3 degrees of external rotation. The chamfer cutting guide was pinned into place. The anterior, posterior, and chamfer cuts were accomplished without difficulty followed by the Attune RP box cutting guide and the box cut. We also removed posterior osteophytes from the posterior femoral condyles. The posterior capsule was injected with Exparel solution. The knee was brought into full extension. We checked our extension gap and fit a 5 mm bearing. Distracting in extension with a lamina spreader,  bleeders in the posterior capsule, Posterior medial and posterior lateral right down and cauterized.  The transexamic acid-soaked sponge was then placed in the gap of the knee and extension. The knee was flexed 30. The posterior patella cut was accomplished with the 9.5 mm Attune cutting guide, sized for a 35mm dome, and the fixation pegs drilled.The knee was then once again hyperflexed exposing the proximal tibia. We sized for a # 8 tibial base plate, applied the smokestack and the conical reamer followed by the the Delta fin keel punch. We then hammered into place the Attune RP trial femoral component, drilled the lugs, inserted a  5 mm trial bearing, trial patellar button, and took the knee through range of motion from 0-130 degrees. Medial and lateral ligamentous stability was checked. No thumb pressure was required for patellar Tracking. The tourniquet was not used. All trial components were removed, mating surfaces irrigated with pulse lavage, and dried with suction and sponges. 10 cc of the Exparel solution was applied to the cancellus bone of the patella distal femur and proximal tibia.  After waiting 30 seconds, the bony surfaces were again, dried with sponges. A double batch of DePuy HV cement was mixed and applied to all bony metallic mating surfaces except for the posterior condyles of the femur itself. In order, we hammered into place  the tibial tray and removed excess cement, the femoral component and removed excess cement. The final Attune RP bearing was inserted, and the knee brought to full extension with compression. The patellar button was clamped into place, and excess cement removed. The knee was held at 30 flexion with compression, while the cement cured. The wound was irrigated out with normal saline solution pulse lavage. The rest of the Exparel was injected into the parapatellar arthrotomy, subcutaneous tissues, and periosteal tissues. The parapatellar arthrotomy was closed with running #1 Vicryl suture. The subcutaneous tissue with 0 and 2-0 undyed Vicryl suture, and the skin with running 3-0 SQ vicryl. An Aquacil and  Ace wrap were applied. The patient was taken to recovery room without difficulty.   [name] 02/20/2018, 8:45 AM

## 2018-02-20 NOTE — Anesthesia Procedure Notes (Signed)
Spinal  Patient location during procedure: OR Start time: 02/20/2018 7:11 AM End time: 02/20/2018 7:21 AM Staffing Anesthesiologist: Duane Boston, MD Performed: anesthesiologist  Preanesthetic Checklist Completed: patient identified, surgical consent, pre-op evaluation, timeout performed, IV checked, risks and benefits discussed and monitors and equipment checked Spinal Block Patient position: sitting Prep: DuraPrep Patient monitoring: cardiac monitor, continuous pulse ox and blood pressure Approach: midline Location: L2-3 Injection technique: single-shot Needle Needle type: Pencan  Needle gauge: 24 G Needle length: 9 cm Additional Notes Functioning IV was confirmed and monitors were applied. Sterile prep and drape, including hand hygiene and sterile gloves were used. The patient was positioned and the spine was prepped. The skin was anesthetized with lidocaine.  Free flow of clear CSF was obtained prior to injecting local anesthetic into the CSF.  The spinal needle aspirated freely following injection.  The needle was carefully withdrawn.  The patient tolerated the procedure well.

## 2018-02-20 NOTE — Discharge Instructions (Signed)

## 2018-02-20 NOTE — Anesthesia Procedure Notes (Signed)
Anesthesia Regional Block: Adductor canal block   Pre-Anesthetic Checklist: ,, timeout performed, Correct Patient, Correct Site, Correct Laterality, Correct Procedure, Correct Position, site marked, Risks and benefits discussed,  Surgical consent,  Pre-op evaluation,  At surgeon's request and post-op pain management  Laterality: Left  Prep: chloraprep       Needles:  Injection technique: Single-shot  Needle Type: Stimulator Needle - 80     Needle Length: 10cm  Needle Gauge: 21     Additional Needles:   Narrative:  Start time: 02/20/2018 6:56 AM End time: 02/20/2018 7:06 AM Injection made incrementally with aspirations every 5 mL.  Performed by: Personally

## 2018-02-20 NOTE — Evaluation (Signed)
Physical Therapy Evaluation Patient Details Name: David Lindsey MRN: [id number] DOB: 1956-01-01 Today's Date: 02/20/2018   History of Present Illness  Pt is a 62 year old male s/p L TKA with hx of R TKA in 2011, DMII and peripheral neuropathy.  Clinical Impression  Pt is s/p TKA resulting in the deficits listed below (see PT Problem List).  Pt will benefit from skilled PT to increase their independence and safety with mobility to allow discharge to the venue listed below.  Pt assisted with short distance ambulation in hallway POD #0 and plans to return home with f/u HHPT.     Follow Up Recommendations Follow surgeon's recommendation for DC plan and follow-up therapies(plan for HHPT)    Equipment Recommendations  Rolling walker with 5" wheels    Recommendations for Other Services       Precautions / Restrictions Precautions Precautions: Knee;Fall Restrictions Other Position/Activity Restrictions: WBAT      Mobility  Bed Mobility Overal bed mobility: Needs Assistance Bed Mobility: Supine to Sit     Supine to sit: Min guard;HOB elevated     General bed mobility comments: verbal cues for self assist  Transfers Overall transfer level: Needs assistance Equipment used: Rolling walker (2 wheeled) Transfers: Sit to/from Stand Sit to Stand: Min assist;From elevated surface         General transfer comment: verbal cues for UE and LE positioning, assist to rise and steady as well as control descent  Ambulation/Gait Ambulation/Gait assistance: Min assist;Min guard Gait Distance (Feet): 60 Feet Assistive device: Rolling walker (2 wheeled) Gait Pattern/deviations: Step-to pattern;Decreased stance time - left;Antalgic     General Gait Details: verbal cues for sequence, RW positioning, step length, posture  Stairs            Wheelchair Mobility    Modified Rankin (Stroke Patients Only)       Balance                                              Pertinent Vitals/Pain Pain Assessment: 0-10 Pain Score: 3  Pain Location: L knee Pain Descriptors / Indicators: Aching;Sore Pain Intervention(s): Limited activity within patient's tolerance;Repositioned;Monitored during session    [place] expects to be discharged to:: Private residence Living Arrangements: Spouse/significant other   Type of Home: House Home Access: Stairs to enter Entrance Stairs-Rails: None Technical brewer of Steps: 3 Home Layout: One level Home Equipment: None      Prior Function Level of Independence: Independent               Hand Dominance        Extremity/Trunk Assessment        Lower Extremity Assessment Lower Extremity Assessment: LLE deficits/detail LLE Deficits / Details: able to perform SLR, presents with collapsed L arch, reports hx of neuropathy, knee flexion TBA however observed grossly 65* AROM with mobility       Communication   Communication: No difficulties  Cognition Arousal/Alertness: Awake/alert Behavior During Therapy: WFL for tasks assessed/performed Overall Cognitive Status: Within Functional Limits for tasks assessed                                        General Comments      Exercises     Assessment/Plan  PT Assessment Patient needs continued PT services  PT Problem List Decreased strength;Decreased mobility;Decreased range of motion;Decreased knowledge of use of DME;Pain       PT Treatment Interventions Stair training;Gait training;Therapeutic exercise;Patient/family education;DME instruction;Therapeutic activities;Functional mobility training    PT Goals (Current goals can be found in the Care Plan section)  Acute Rehab PT Goals PT Goal Formulation: With patient Time For Goal Achievement: 02/25/18 Potential to Achieve Goals: Good    Frequency 7X/week   Barriers to discharge        Co-evaluation               AM-PAC PT "6 Clicks" Daily  Activity  Outcome Measure Difficulty turning over in bed (including adjusting bedclothes, sheets and blankets)?: A Little Difficulty moving from lying on back to sitting on the side of the bed? : Unable Difficulty sitting down on and standing up from a chair with arms (e.g., wheelchair, bedside commode, etc,.)?: Unable Help needed moving to and from a bed to chair (including a wheelchair)?: A Little Help needed walking in hospital room?: A Little Help needed climbing 3-5 steps with a railing? : A Lot 6 Click Score: 13    End of Session Equipment Utilized During Treatment: Gait belt Activity Tolerance: Patient tolerated treatment well Patient left: with chair alarm set;in chair;with call bell/phone within reach   PT Visit Diagnosis: Other abnormalities of gait and mobility (R26.89)    Time: 1424-1440 PT Time Calculation (min) (ACUTE ONLY): 16 min   Charges:   PT Evaluation $PT Eval Low Complexity: [place], PT, DPT Acute Rehabilitation Services Office: [phone] Pager: [phone]  Trena Platt 02/20/2018, 3:43 PM

## 2018-02-21 ENCOUNTER — Encounter (HOSPITAL_COMMUNITY): Payer: Self-pay | Admitting: Orthopedic Surgery

## 2018-02-21 DIAGNOSIS — M1712 Unilateral primary osteoarthritis, left knee: Secondary | ICD-10-CM | POA: Diagnosis not present

## 2018-02-21 LAB — CBC
HCT: 37.2 % — ABNORMAL LOW (ref 39.0–52.0)
HEMOGLOBIN: 11.8 g/dL — AB (ref 13.0–17.0)
MCH: 28.4 pg (ref 26.0–34.0)
MCHC: 31.7 g/dL (ref 30.0–36.0)
MCV: 89.6 fL (ref 80.0–100.0)
Platelets: 132 10*3/uL — ABNORMAL LOW (ref 150–400)
RBC: 4.15 MIL/uL — AB (ref 4.22–5.81)
RDW: 12.7 % (ref 11.5–15.5)
WBC: 6.9 10*3/uL (ref 4.0–10.5)
nRBC: 0 % (ref 0.0–0.2)

## 2018-02-21 LAB — BASIC METABOLIC PANEL
ANION GAP: 9 (ref 5–15)
BUN: 21 mg/dL (ref 8–23)
CHLORIDE: 98 mmol/L (ref 98–111)
CO2: 25 mmol/L (ref 22–32)
Calcium: 8.4 mg/dL — ABNORMAL LOW (ref 8.9–10.3)
Creatinine, Ser: 2.08 mg/dL — ABNORMAL HIGH (ref 0.61–1.24)
GFR calc Af Amer: 37 mL/min — ABNORMAL LOW (ref >60)
GFR calc non Af Amer: 32 mL/min — ABNORMAL LOW (ref >60)
Glucose, Bld: 172 mg/dL — ABNORMAL HIGH (ref 70–99)
POTASSIUM: 4.3 mmol/L (ref 3.5–5.1)
SODIUM: 132 mmol/L — AB (ref 135–145)

## 2018-02-21 LAB — GLUCOSE, CAPILLARY
GLUCOSE-CAPILLARY: 163 mg/dL — AB (ref 70–99)
GLUCOSE-CAPILLARY: 152 mg/dL — AB (ref 70–99)
Glucose-Capillary: 192 mg/dL — ABNORMAL HIGH (ref 70–99)
Glucose-Capillary: 152 mg/dL — ABNORMAL HIGH (ref 70–99)

## 2018-02-21 MED ORDER — HYDROMORPHONE HCL 2 MG PO TABS
2.0000 mg | ORAL_TABLET | ORAL | Status: DC | PRN
  Administered 2018-02-21 – 2018-02-22 (×7): 2 mg via ORAL
  Filled 2018-02-21 (×7): qty 1

## 2018-02-21 NOTE — Progress Notes (Signed)
PATIENT ID: David Lindsey  MRN: [id number]  DOB/AGE:  1956/02/06 / 62 y.o.  1 Day Post-Op Procedure(s) (LRB): LEFT TOTAL KNEE ARTHROPLASTY (Left)    PROGRESS NOTE Subjective: Patient is alert, oriented, no Nausea, no Vomiting, yes passing gas. Taking PO well. Denies SOB, Chest or Calf Pain. Using Incentive Spirometer, PAS in place. Ambulate 60', Patient reports pain as 3/10 .    Objective: Vital signs in last 24 hours: Vitals:   02/20/18 1801 02/20/18 2116 02/21/18 0101 02/21/18 0450  BP: (Abnormal) 148/89 112/63 138/81 136/80  Pulse: (Abnormal) 109 (Abnormal) 114 (Abnormal) 102 92  Resp: 14 20 19 18   Temp: 99.7 F (37.6 C) (Abnormal) 101.8 F (38.8 C) (Abnormal) 100.6 F (38.1 C) 99 F (37.2 C)  TempSrc: Oral Oral Oral Oral  SpO2: 99% 99% 99% 99%  Weight:      Height:          Intake/Output from previous day: I/O last 3 completed shifts: In: 4083.3 [P.O.:840; I.V.:3133.3; IV Piggyback:110] Out: 9242 [Urine:3275; Blood:200]   Intake/Output this shift: No intake/output data recorded.   LABORATORY DATA: Recent Labs    02/20/18 1647 02/20/18 2242 02/21/18 0436 02/21/18 0723  WBC  --   --  6.9  --   HGB  --   --  11.8*  --   HCT  --   --  37.2*  --   PLT  --   --  132*  --   NA  --   --  132*  --   K  --   --  4.3  --   CL  --   --  98  --   CO2  --   --  25  --   BUN  --   --  21  --   CREATININE  --   --  2.08*  --   GLUCOSE  --   --  172*  --   GLUCAP 153* 122*  --  163*  CALCIUM  --   --  8.4*  --     Examination: Neurologically intact ABD soft Neurovascular intact Sensation intact distally Intact pulses distally Dorsiflexion/Plantar flexion intact Incision: dressing C/D/I No cellulitis present Compartment soft}  Assessment:   1 Day Post-Op Procedure(s) (LRB): LEFT TOTAL KNEE ARTHROPLASTY (Left) ADDITIONAL DIAGNOSIS: Expected Acute Blood Loss Anemia, Diabetes, Hypertension and Renal Insufficiency Chronic  Patient's anticipated LOS is less than  2 midnights, meeting these requirements: - Younger than 23 - Lives within 1 hour of care - Has a competent adult at home to recover with post-op recover    Plan: PT/OT WBAT, AROM and PROM  DVT Prophylaxis:  SCDx72hrs, ASA 81 mg BID x 2 weeks DISCHARGE PLAN: Home,Today if he passes all PT goals DISCHARGE NEEDS: HHPT, Walker and 3-in-1 comode seat     [name] 02/21/2018, 7:52 AM

## 2018-02-21 NOTE — Care Management Note (Signed)
Case Management Note  Patient Details  Name: David Lindsey MRN: [id number] Date of Birth: [birth year]  Subjective/Objective:     Discharge planning, spoke with patient and spouse at beside. Chose [facility] for [facility] services, PT to eval and treat.               Action/Plan: Contacted [facility] for referral, they have accepted. Needs a RW and 3-n-1, contacted [place] to deliver to the room. [phone]   Expected Discharge Date:  02/21/18               Expected Discharge Plan:  [place]  In-House Referral:  NA  Discharge planning Services  CM Consult  Post Acute Care Choice:  Home Health, Durable Medical Equipment Choice offered to:  Patient  DME Arranged:  3-N-1, Walker rolling DME Agency:  [place]:  PT [place]:  [place]  Status of Service:  Completed, signed off  If discussed at [place] of Stay Meetings, dates discussed:    Additional Comments:  Guadalupe Maple, RN 02/21/2018, 9:43 AM

## 2018-02-21 NOTE — Plan of Care (Signed)

## 2018-02-21 NOTE — Discharge Summary (Signed)
Patient ID: David Lindsey MRN: [id number] DOB/AGE: 1955-06-06 62 y.o.  Admit date: 02/20/2018 Discharge date: 02/21/2018  Admission Diagnoses:  Active Problems:   S/P total knee arthroplasty, left   Discharge Diagnoses:  Same  Past Medical History:  Diagnosis Date  . ALCOHOLISM 12/22/2007  . ANEMIA, IRON DEFICIENCY 12/26/2007  . Arthritis    OA  . Blood transfusion without reported diagnosis    as a child  . DIABETES MELLITUS, TYPE II 11/10/2006  . ESOPHAGITIS, REFLUX 12/22/2007  . GERD (gastroesophageal reflux disease)   . HYPERCHOLESTEROLEMIA 12/22/2007  . HYPERTENSION 11/10/2006  . HYPOGONADISM 12/22/2007   Prob. due to ETOH  . HYPOKALEMIA 06/06/2008  . Leukopenia   . Neuropathy    Chronic Neuropathy BOTH FEET  . Pancytopenia 06/06/2008  . RENAL INSUFFICIENCY 11/10/2006   SEES [facility] 2 CKD    Surgeries: Procedure(s): LEFT TOTAL KNEE ARTHROPLASTY on 02/20/2018   Consultants:   Discharged Condition: Improved  Hospital Course: David Lindsey is an 62 y.o. male who was admitted 02/20/2018 for operative treatment of<principal problem not specified>. Patient has severe unremitting pain that affects sleep, daily activities, and work/hobbies. After pre-op clearance the patient was taken to the operating room on 02/20/2018 and underwent  Procedure(s): LEFT TOTAL KNEE ARTHROPLASTY.    Patient was given perioperative antibiotics:  Anti-infectives (From admission, onward)   Start     Dose/Rate Route Frequency Ordered Stop   02/20/18 0600  ceFAZolin (ANCEF) 3 g in dextrose 5 % 50 mL IVPB     3 g 100 mL/hr over 30 Minutes Intravenous 30 min pre-op 02/19/18 0838 02/20/18 0715       Patient was given sequential compression devices, early ambulation, and chemoprophylaxis to prevent DVT.  Patient benefited maximally from hospital stay and there were no complications.    Recent vital signs:  Patient Vitals for the past 24 hrs:  BP Temp Temp src Pulse Resp SpO2   02/21/18 0450 136/80 99 F (37.2 C) Oral 92 18 99 %  02/21/18 0101 138/81 (Abnormal) 100.6 F (38.1 C) Oral (Abnormal) 102 19 99 %  02/20/18 2116 112/63 (Abnormal) 101.8 F (38.8 C) Oral (Abnormal) 114 20 99 %  02/20/18 1801 (Abnormal) 148/89 99.7 F (37.6 C) Oral (Abnormal) 109 14 99 %  02/20/18 1357 137/79 98.3 F (36.8 C) Oral 98 14 100 %  02/20/18 1305 (Abnormal) 144/86 97.8 F (36.6 C) Oral 88 14 100 %  02/20/18 1207 116/75 (Abnormal) 97.3 F (36.3 C) Oral 74 14 100 %  02/20/18 1053 117/72 (Abnormal) 97.5 F (36.4 C) Oral 64 16 100 %  02/20/18 1030 105/71 (Abnormal) 97.4 F (36.3 C) no documentation 75 11 100 %  02/20/18 0945 105/72 no documentation no documentation 67 (Abnormal) 8 100 %  02/20/18 0930 110/65 no documentation no documentation 71 (Abnormal) 7 100 %  02/20/18 0915 105/66 no documentation no documentation 82 10 100 %  02/20/18 0906 (Abnormal) 92/56 97.6 F (36.4 C) no documentation 84 12 100 %     Recent laboratory studies:  Recent Labs    02/21/18 0436  WBC 6.9  HGB 11.8*  HCT 37.2*  PLT 132*  NA 132*  K 4.3  CL 98  CO2 25  BUN 21  CREATININE 2.08*  GLUCOSE 172*  CALCIUM 8.4*     Discharge Medications:   Allergies as of 02/21/2018    Allergen Reactions Comment   Pioglitazone Swelling       Medication List    Stop taking these  medications   acetaminophen 500 MG tablet Commonly known as:  TYLENOL     Take these medications   acarbose 25 MG tablet Commonly known as:  PRECOSE Take 1 tablet (25 mg total) by mouth 3 (three) times daily with meals.   Apple Cider Vinegar 600 MG Caps Take 600 mg by mouth daily.   aspirin EC 81 MG tablet Take 1 tablet (81 mg total) by mouth 2 (two) times daily. What changed:  when to take this   B-12 5000 MCG Caps Take 5,000 mcg by mouth every evening.   canagliflozin 100 MG Tabs tablet Commonly known as:  INVOKANA Take 1 tablet (100 mg total) daily before breakfast by mouth. What changed:  when  to take this   clotrimazole-betamethasone cream Commonly known as:  LOTRISONE Apply 3x a day as needed for rash What changed:    how much to take  how to take this  when to take this  reasons to take this   CoQ-10 200 MG Caps Take 200 mg by mouth every evening.   docusate sodium 100 MG capsule Commonly known as:  COLACE Take 200 mg by mouth every evening.   EASY TOUCH PEN NEEDLES 31G X 8 MM Misc Generic drug:  Insulin Pen Needle USE ONCE DAILY WITH VICTOZA   furosemide 20 MG tablet Commonly known as:  LASIX Take 1 tablet (20 mg total) by mouth daily.   gabapentin 300 MG capsule Commonly known as:  NEURONTIN Take 1 capsule (300 mg total) by mouth 3 (three) times daily.   HAIR SKIN & NAILS GUMMIES PO Take 1 tablet by mouth every evening.   HAIR/SKIN/NAILS Caps Take 1 tablet by mouth daily.   HM FIBER 500 MG Tabs Generic drug:  Methylcellulose (Laxative) Take 500 mg by mouth 2 (two) times daily. Fiberwell Gummies   insulin lispro 100 UNIT/ML KiwkPen Commonly known as:  HUMALOG Inject 0.1 mLs (10 Units total) into the skin 3 (three) times daily with meals. And pen needles 4/day   lisinopril-hydrochlorothiazide 10-12.5 MG tablet Commonly known as:  PRINZIDE,ZESTORETIC Take 0.5 tablets by mouth daily. What changed:  when to take this   LUBRICATING EYE DROPS OP Place 1 drop into both eyes daily as needed (dry eyes).   metoprolol succinate 25 MG 24 hr tablet Commonly known as:  TOPROL-XL Take 0.5 tablets (12.5 mg total) by mouth daily. What changed:  when to take this   multivitamin with minerals Tabs tablet Take 1 tablet by mouth every evening.   Omega-3-6-9 Caps Take 1 capsule by mouth every evening. 1600mg    omeprazole 20 MG capsule Commonly known as:  PRILOSEC TAKE 1 CAPSULE BY MOUTH ONCE DAILY What changed:  when to take this   ONE TOUCH ULTRA TEST test strip Generic drug:  glucose blood Use as instructed check blood sugar two times daily    oxyCODONE-acetaminophen 5-325 MG tablet Commonly known as:  PERCOCET/ROXICET Take 1 tablet by mouth every 4 (four) hours as needed for severe pain.   repaglinide 2 MG tablet Commonly known as:  PRANDIN Take 2 mg by mouth 3 (three) times daily before meals.   rosuvastatin 5 MG tablet Commonly known as:  CRESTOR Take 1 tablet (5 mg total) by mouth daily. What changed:  when to take this   sildenafil 100 MG tablet Commonly known as:  VIAGRA TAKE ONE TABLET BY MOUTH ONCE DAILY AS NEEDED What changed:    how much to take  how to take this  when  to take this  reasons to take this  additional instructions   STOPAIN ROLL-ON 8 % Liqd Generic drug:  Menthol (Topical Analgesic) Apply 1 application topically 3 (three) times daily as needed (for knee pain.).   tiZANidine 2 MG tablet Commonly known as:  ZANAFLEX Take 1 tablet (2 mg total) by mouth every 6 (six) hours as needed.   traZODone 50 MG tablet Commonly known as:  DESYREL TAKE 1 TABLET BY MOUTH AT  BEDTIME   trolamine salicylate 10 % cream Commonly known as:  ASPERCREME Apply 1 application topically as needed (knee pain).   VICTOZA 18 MG/3ML Sopn Generic drug:  liraglutide INJECT 1.8MG  UNDER THE SKIN DAILY What changed:  See the new instructions.   vitamin C 1000 MG tablet Take 1,000 mg by mouth every evening.   vitamin E 400 UNIT capsule Take 400 Units by mouth every evening.        Durable Medical Equipment  (From admission, onward)         Start     Ordered   02/20/18 1058  DME Walker rolling  Once    Question:  Patient needs a walker to treat with the following condition  Answer:  Status post total left knee replacement   02/20/18 1057   02/20/18 1058  DME 3 n 1  Once     02/20/18 1057           Discharge Care Instructions  (From admission, onward)         Start     Ordered   02/21/18 0000  Change dressing    Comments:  Change dressing, if drainage exceeds 40% of window   02/21/18 0756           Diagnostic Studies: No results found.  Disposition: Discharge disposition: 01-Home or Self Care       Discharge Instructions    Call MD / Call 911   Complete by:  As directed    If you experience chest pain or shortness of breath, CALL 911 and be transported to the hospital emergency room.  If you develope a fever above 101 F, pus (white drainage) or increased drainage or redness at the wound, or calf pain, call your surgeon's office.   Change dressing   Complete by:  As directed    Change dressing, if drainage exceeds 40% of window   Constipation Prevention   Complete by:  As directed    Drink plenty of fluids.  Prune juice may be helpful.  You may use a stool softener, such as Colace (over the counter) 100 mg twice a day.  Use MiraLax (over the counter) for constipation as needed.   Diet - low sodium heart healthy   Complete by:  As directed    Increase activity slowly as tolerated   Complete by:  As directed       Follow-up Information    Frederik Pear, MD In 2 weeks.   Specialty:  Orthopedic Surgery Contact information: [place] [id number] [phone]            Signed: [name] 02/21/2018, 7:57 AM

## 2018-02-21 NOTE — Progress Notes (Signed)
Physical Therapy Treatment Patient Details Name: David Lindsey MRN: [id number] DOB: [birth year] Today's Date: 02/21/2018    History of Present Illness Pt is a 62 year old male s/p L TKA with hx of R TKA in 2011, DMII and peripheral neuropathy.    PT Comments    Pt received pain meds approx 40 minutes prior to session. Pt reports pain 5/10 at rest however increased to 9/10 with movement and ambulation.  Pt was able to perform LE exercises in recliner.  Pt also agreeable to ambulate as tolerated however decreased distance.  Pt perspirations increased upon returning to room.  NT in to check blood sugar.  Pt does not appear ready for d/c today.   Follow Up Recommendations  Follow surgeon's recommendation for DC plan and follow-up therapies     Equipment Recommendations  Rolling walker with 5" wheels    Recommendations for Other Services       Precautions / Restrictions Precautions Precautions: Knee;Fall Restrictions Other Position/Activity Restrictions: WBAT    Mobility  Bed Mobility Overal bed mobility: Needs Assistance Bed Mobility: Supine to Sit     Supine to sit: Min assist;HOB elevated     General bed mobility comments: pt up in recliner  Transfers Overall transfer level: Needs assistance Equipment used: Rolling walker (2 wheeled) Transfers: Sit to/from Stand Sit to Stand: Mod assist         General transfer comment: verbal cues for UE and LE positioning, assist to rise and steady as well as control descent, mod assist to/from lower recliner  Ambulation/Gait Ambulation/Gait assistance: Min assist Gait Distance (Feet): 40 Feet Assistive device: Rolling walker (2 wheeled) Gait Pattern/deviations: Step-to pattern;Decreased stance time - left;Antalgic     General Gait Details: verbal cues for sequence, RW positioning, step length, posture, reports pain 9/10 L knee   Stairs             Wheelchair Mobility    Modified Rankin (Stroke Patients Only)        Balance                                            Cognition Arousal/Alertness: Awake/alert Behavior During Therapy: WFL for tasks assessed/performed Overall Cognitive Status: Within Functional Limits for tasks assessed                                        Exercises Total Joint Exercises Ankle Circles/Pumps: AROM;10 reps;Both Quad Sets: AROM;10 reps;Left Towel Squeeze: AROM;10 reps;Both Short Arc Quad: AAROM;10 reps;Left Heel Slides: AAROM;10 reps;Left Hip ABduction/ADduction: AAROM;10 reps;Left Straight Leg Raises: AAROM;10 reps;Left Goniometric ROM: approx 35* AAROM L knee flexion    General Comments        Pertinent Vitals/Pain Pain Assessment: 0-10 Pain Score: 6  Pain Location: L knee Pain Descriptors / Indicators: Aching;Sore Pain Intervention(s): Limited activity within patient's tolerance;Monitored during session;Ice applied;Repositioned;Premedicated before session    Home Living                      Prior Function            PT Goals (current goals can now be found in the care plan section) Progress towards PT goals: Progressing toward goals    Frequency    7X/week  PT Plan Current plan remains appropriate    Co-evaluation              AM-PAC PT "6 Clicks" Daily Activity  Outcome Measure  Difficulty turning over in bed (including adjusting bedclothes, sheets and blankets)?: A Little Difficulty moving from lying on back to sitting on the side of the bed? : Unable Difficulty sitting down on and standing up from a chair with arms (e.g., wheelchair, bedside commode, etc,.)?: Unable Help needed moving to and from a bed to chair (including a wheelchair)?: A Lot Help needed walking in hospital room?: A Lot Help needed climbing 3-5 steps with a railing? : Total 6 Click Score: 10    End of Session Equipment Utilized During Treatment: Gait belt Activity Tolerance: Patient limited by  pain Patient left: in chair;with call bell/phone within reach;with chair alarm set Nurse Communication: Mobility status PT Visit Diagnosis: Other abnormalities of gait and mobility (R26.89)     Time: 1638-4665 PT Time Calculation (min) (ACUTE ONLY): 23 min  Charges:  $Gait Training: 8-22 mins $Therapeutic Exercise: 8-22 mins                     David Lindsey, PT, DPT Acute Rehabilitation Services Office: [phone] Pager: [phone]  David Lindsey 02/21/2018, 1:25 PM

## 2018-02-21 NOTE — Progress Notes (Signed)
Physical Therapy Treatment Patient Details Name: David Lindsey MRN: [id number] DOB: 04-29-1955 Today's Date: 02/21/2018    History of Present Illness Pt is a 62 year old male s/p L TKA with hx of R TKA in 2011, DMII and peripheral neuropathy.    PT Comments    Pt ambulated in hallway and pain increased to 8/10 L knee.  Pt had pain meds previously this morning and not yet due for more per RN.  Pt did not feel able to tolerate exercises this morning.  Pt currently min assist for mobility due to pain.   Follow Up Recommendations  Follow surgeon's recommendation for DC plan and follow-up therapies     Equipment Recommendations  Rolling walker with 5" wheels    Recommendations for Other Services       Precautions / Restrictions Precautions Precautions: Knee;Fall Restrictions Other Position/Activity Restrictions: WBAT    Mobility  Bed Mobility Overal bed mobility: Needs Assistance Bed Mobility: Supine to Sit     Supine to sit: Min assist;HOB elevated     General bed mobility comments: assist for L LE  Transfers Overall transfer level: Needs assistance Equipment used: Rolling walker (2 wheeled) Transfers: Sit to/from Stand Sit to Stand: Min assist         General transfer comment: verbal cues for UE and LE positioning, assist to rise and steady as well as control descent  Ambulation/Gait Ambulation/Gait assistance: Min assist Gait Distance (Feet): 60 Feet Assistive device: Rolling walker (2 wheeled) Gait Pattern/deviations: Step-to pattern;Decreased stance time - left;Antalgic     General Gait Details: verbal cues for sequence, RW positioning, step length, posture   Stairs             Wheelchair Mobility    Modified Rankin (Stroke Patients Only)       Balance                                            Cognition Arousal/Alertness: Awake/alert Behavior During Therapy: WFL for tasks assessed/performed Overall Cognitive  Status: Within Functional Limits for tasks assessed                                        Exercises      General Comments        Pertinent Vitals/Pain Pain Assessment: 0-10 Pain Score: 5  Pain Location: L knee Pain Descriptors / Indicators: Aching;Sore Pain Intervention(s): Limited activity within patient's tolerance;Monitored during session;Repositioned;Ice applied;Premedicated before session    Home Living                      Prior Function            PT Goals (current goals can now be found in the care plan section) Progress towards PT goals: Progressing toward goals    Frequency    7X/week      PT Plan Current plan remains appropriate    Co-evaluation              AM-PAC PT "6 Clicks" Daily Activity  Outcome Measure  Difficulty turning over in bed (including adjusting bedclothes, sheets and blankets)?: A Little Difficulty moving from lying on back to sitting on the side of the bed? : Unable Difficulty sitting down on and standing up  from a chair with arms (e.g., wheelchair, bedside commode, etc,.)?: Unable Help needed moving to and from a bed to chair (including a wheelchair)?: A Little Help needed walking in hospital room?: A Little Help needed climbing 3-5 steps with a railing? : A Lot 6 Click Score: 13    End of Session Equipment Utilized During Treatment: Gait belt Activity Tolerance: Patient limited by pain Patient left: in chair;with call bell/phone within reach;with family/visitor present Nurse Communication: Mobility status;Patient requests pain meds PT Visit Diagnosis: Other abnormalities of gait and mobility (R26.89)     Time: 0722-5750 PT Time Calculation (min) (ACUTE ONLY): 16 min  Charges:  $Gait Training: 8-22 mins                     Carmelia Bake, PT, DPT Acute Rehabilitation Services Office: [phone] Pager: [phone]  Trena Platt 02/21/2018, 12:06 PM

## 2018-02-22 DIAGNOSIS — M1712 Unilateral primary osteoarthritis, left knee: Secondary | ICD-10-CM | POA: Diagnosis not present

## 2018-02-22 LAB — CBC
HCT: 39.2 % (ref 39.0–52.0)
HEMOGLOBIN: 12.7 g/dL — AB (ref 13.0–17.0)
MCH: 27.7 pg (ref 26.0–34.0)
MCHC: 32.4 g/dL (ref 30.0–36.0)
MCV: 85.6 fL (ref 80.0–100.0)
NRBC: 0 % (ref 0.0–0.2)
PLATELETS: 165 10*3/uL (ref 150–400)
RBC: 4.58 MIL/uL (ref 4.22–5.81)
RDW: 12.6 % (ref 11.5–15.5)
WBC: 8.5 10*3/uL (ref 4.0–10.5)

## 2018-02-22 LAB — GLUCOSE, CAPILLARY: Glucose-Capillary: 101 mg/dL — ABNORMAL HIGH (ref 70–99)

## 2018-02-22 MED ORDER — HYDROMORPHONE HCL 2 MG PO TABS: 2.0000 mg | ORAL_TABLET | ORAL | 0 refills | Status: DC | PRN

## 2018-02-22 NOTE — Progress Notes (Signed)
PATIENT ID: David Lindsey  MRN: [id number]  DOB/AGE:  Jan 29, 1956 / 62 y.o.  2 Days Post-Op Procedure(s) (LRB): LEFT TOTAL KNEE ARTHROPLASTY (Left)    PROGRESS NOTE Subjective: Patient is alert, oriented, no Nausea, no Vomiting, yes passing gas. Taking PO well. Denies SOB, Chest or Calf Pain. Using Incentive Spirometer, PAS in place. Ambulate WBAT with pt walking 40 ft with therapy, Patient reports pain as 5/10 .    Objective: Vital signs in last 24 hours: Vitals:   02/21/18 1036 02/21/18 1408 02/21/18 2346 02/22/18 0526  BP: (!) 151/85 (!) 163/88 120/75 123/78  Pulse: 98 (!) 104 (!) 105 (!) 104  Resp: 16 16 16 17   Temp: 99 F (37.2 C) 99.1 F (37.3 C) 99.5 F (37.5 C) 99.7 F (37.6 C)  TempSrc: Oral Oral Oral Oral  SpO2: 100% 98% 99% 99%  Weight:      Height:          Intake/Output from previous day: I/O last 3 completed shifts: In: 2580.3 [P.O.:720; I.V.:1860.3] Out: 5625 [Urine:5625]   Intake/Output this shift: Total I/O In: -  Out: 225 [Urine:225]   LABORATORY DATA: Recent Labs    02/21/18 0436  02/21/18 1711 02/21/18 2131 02/22/18 0359 02/22/18 0758  WBC 6.9  --   --   --  8.5  --   HGB 11.8*  --   --   --  12.7*  --   HCT 37.2*  --   --   --  39.2  --   PLT 132*  --   --   --  165  --   NA 132*  --   --   --   --   --   K 4.3  --   --   --   --   --   CL 98  --   --   --   --   --   CO2 25  --   --   --   --   --   BUN 21  --   --   --   --   --   CREATININE 2.08*  --   --   --   --   --   GLUCOSE 172*  --   --   --   --   --   GLUCAP  --    < > 152* 152*  --  101*  CALCIUM 8.4*  --   --   --   --   --    < > = values in this interval not displayed.    Examination: Neurologically intact Neurovascular intact Sensation intact distally Intact pulses distally Dorsiflexion/Plantar flexion intact Incision: dressing C/D/I and no drainage No cellulitis present Compartment soft}  Assessment:   2 Days Post-Op Procedure(s) (LRB): LEFT TOTAL KNEE  ARTHROPLASTY (Left) ADDITIONAL DIAGNOSIS: Expected Acute Blood Loss Anemia, Diabetes, Hypertension and Renal Insufficiency Chronic Anticipated LOS equal to or greater than 2 midnights due to - Age 79 and older with one or more of the following:  - Obesity  - Expected need for hospital services (PT, OT, Nursing) required for safe  discharge  - Anticipated need for postoperative skilled nursing care or inpatient rehab  - Active co-morbidities: Chronic pain requiring opiods, Diabetes and Anemia   Plan: PT/OT WBAT, AROM and PROM  DVT Prophylaxis:  SCDx72hrs, ASA 81 mg BID x 2 weeks DISCHARGE PLAN: Home, today once he passes therapy DISCHARGE NEEDS: HHPT, Walker and 3-in-1  comode seat     David Lindsey 02/22/2018, 11:57 AM

## 2018-02-22 NOTE — Progress Notes (Signed)
Patient states his pain level is decreasing with Dilaudid po.

## 2018-02-22 NOTE — Discharge Summary (Signed)
Patient ID: David Lindsey MRN: [id number] DOB/AGE: 1955/11/30 62 y.o.  Admit date: 02/20/2018 Discharge date: 02/22/2018  Admission Diagnoses:  Active Problems:   S/P total knee arthroplasty, left   Discharge Diagnoses:  Same  Past Medical History:  Diagnosis Date  . ALCOHOLISM 12/22/2007  . ANEMIA, IRON DEFICIENCY 12/26/2007  . Arthritis    OA  . Blood transfusion without reported diagnosis    as a child  . DIABETES MELLITUS, TYPE II 11/10/2006  . ESOPHAGITIS, REFLUX 12/22/2007  . GERD (gastroesophageal reflux disease)   . HYPERCHOLESTEROLEMIA 12/22/2007  . HYPERTENSION 11/10/2006  . HYPOGONADISM 12/22/2007   Prob. due to ETOH  . HYPOKALEMIA 06/06/2008  . Leukopenia   . Neuropathy    Chronic Neuropathy BOTH FEET  . Pancytopenia 06/06/2008  . RENAL INSUFFICIENCY 11/10/2006   SEES [facility] STAGE 2 CKD    Surgeries: Procedure(s): LEFT TOTAL KNEE ARTHROPLASTY on 02/20/2018   Consultants:   Discharged Condition: Improved  Hospital Course: Ericson Nafziger is an 62 y.o. male who was admitted 02/20/2018 for operative treatment of<principal problem not specified>. Patient has severe unremitting pain that affects sleep, daily activities, and work/hobbies. After pre-op clearance the patient was taken to the operating room on 02/20/2018 and underwent  Procedure(s): LEFT TOTAL KNEE ARTHROPLASTY.    Patient was given perioperative antibiotics:  Anti-infectives (From admission, onward)   Start     Dose/Rate Route Frequency Ordered Stop   02/20/18 0600  ceFAZolin (ANCEF) 3 g in dextrose 5 % 50 mL IVPB     3 g 100 mL/hr over 30 Minutes Intravenous 30 min pre-op 02/19/18 0838 02/20/18 0715       Patient was given sequential compression devices, early ambulation, and chemoprophylaxis to prevent DVT.  Patient benefited maximally from hospital stay and there were no complications.    Recent vital signs:  Patient Vitals for the past 24 hrs:  BP Temp Temp src Pulse Resp SpO2   02/22/18 0526 123/78 99.7 F (37.6 C) Oral (!) 104 17 99 %  02/21/18 2346 120/75 99.5 F (37.5 C) Oral (!) 105 16 99 %  02/21/18 1408 (!) 163/88 99.1 F (37.3 C) Oral (!) 104 16 98 %  02/21/18 1036 (!) 151/85 99 F (37.2 C) Oral 98 16 100 %     Recent laboratory studies:  Recent Labs    02/21/18 0436 02/22/18 0359  WBC 6.9 8.5  HGB 11.8* 12.7*  HCT 37.2* 39.2  PLT 132* 165  NA 132*  --   K 4.3  --   CL 98  --   CO2 25  --   BUN 21  --   CREATININE 2.08*  --   GLUCOSE 172*  --   CALCIUM 8.4*  --      Discharge Medications:   Allergies as of 02/22/2018      Reactions   Pioglitazone Swelling      Medication List    STOP taking these medications   acetaminophen 500 MG tablet Commonly known as:  TYLENOL     TAKE these medications   acarbose 25 MG tablet Commonly known as:  PRECOSE Take 1 tablet (25 mg total) by mouth 3 (three) times daily with meals.   Apple Cider Vinegar 600 MG Caps Take 600 mg by mouth daily.   aspirin EC 81 MG tablet Take 1 tablet (81 mg total) by mouth 2 (two) times daily. What changed:  when to take this   B-12 5000 MCG Caps Take 5,000 mcg by mouth  every evening.   canagliflozin 100 MG Tabs tablet Commonly known as:  INVOKANA Take 1 tablet (100 mg total) daily before breakfast by mouth. What changed:  when to take this   clotrimazole-betamethasone cream Commonly known as:  LOTRISONE Apply 3x a day as needed for rash What changed:    how much to take  how to take this  when to take this  reasons to take this   CoQ-10 200 MG Caps Take 200 mg by mouth every evening.   docusate sodium 100 MG capsule Commonly known as:  COLACE Take 200 mg by mouth every evening.   EASY TOUCH PEN NEEDLES 31G X 8 MM Misc Generic drug:  Insulin Pen Needle USE ONCE DAILY WITH VICTOZA   furosemide 20 MG tablet Commonly known as:  LASIX Take 1 tablet (20 mg total) by mouth daily.   gabapentin 300 MG capsule Commonly known as:   NEURONTIN Take 1 capsule (300 mg total) by mouth 3 (three) times daily.   HAIR SKIN & NAILS GUMMIES PO Take 1 tablet by mouth every evening.   HAIR/SKIN/NAILS Caps Take 1 tablet by mouth daily.   HM FIBER 500 MG Tabs Generic drug:  Methylcellulose (Laxative) Take 500 mg by mouth 2 (two) times daily. Fiberwell Gummies   HYDROmorphone 2 MG tablet Commonly known as:  DILAUDID Take 1 tablet (2 mg total) by mouth every 3 (three) hours as needed for severe pain.   insulin lispro 100 UNIT/ML KiwkPen Commonly known as:  HUMALOG Inject 0.1 mLs (10 Units total) into the skin 3 (three) times daily with meals. And pen needles 4/day   lisinopril-hydrochlorothiazide 10-12.5 MG tablet Commonly known as:  PRINZIDE,ZESTORETIC Take 0.5 tablets by mouth daily. What changed:  when to take this   LUBRICATING EYE DROPS OP Place 1 drop into both eyes daily as needed (dry eyes).   metoprolol succinate 25 MG 24 hr tablet Commonly known as:  TOPROL-XL Take 0.5 tablets (12.5 mg total) by mouth daily. What changed:  when to take this   multivitamin with minerals Tabs tablet Take 1 tablet by mouth every evening.   Omega-3-6-9 Caps Take 1 capsule by mouth every evening. 1600mg    omeprazole 20 MG capsule Commonly known as:  PRILOSEC TAKE 1 CAPSULE BY MOUTH ONCE DAILY What changed:  when to take this   ONE TOUCH ULTRA TEST test strip Generic drug:  glucose blood Use as instructed check blood sugar two times daily   repaglinide 2 MG tablet Commonly known as:  PRANDIN Take 2 mg by mouth 3 (three) times daily before meals.   rosuvastatin 5 MG tablet Commonly known as:  CRESTOR Take 1 tablet (5 mg total) by mouth daily. What changed:  when to take this   sildenafil 100 MG tablet Commonly known as:  VIAGRA TAKE ONE TABLET BY MOUTH ONCE DAILY AS NEEDED What changed:    how much to take  how to take this  when to take this  reasons to take this  additional instructions   STOPAIN  ROLL-ON 8 % Liqd Generic drug:  Menthol (Topical Analgesic) Apply 1 application topically 3 (three) times daily as needed (for knee pain.).   tiZANidine 2 MG tablet Commonly known as:  ZANAFLEX Take 1 tablet (2 mg total) by mouth every 6 (six) hours as needed.   traZODone 50 MG tablet Commonly known as:  DESYREL TAKE 1 TABLET BY MOUTH AT  BEDTIME   trolamine salicylate 10 % cream Commonly known as:  ASPERCREME Apply 1 application topically as needed (knee pain).   VICTOZA 18 MG/3ML Sopn Generic drug:  liraglutide INJECT 1.8MG  UNDER THE SKIN DAILY What changed:  See the new instructions.   vitamin C 1000 MG tablet Take 1,000 mg by mouth every evening.   vitamin E 400 UNIT capsule Take 400 Units by mouth every evening.            Durable Medical Equipment  (From admission, onward)         Start     Ordered   02/20/18 1058  DME Walker rolling  Once    Question:  Patient needs a walker to treat with the following condition  Answer:  Status post total left knee replacement   02/20/18 1057   02/20/18 1058  DME 3 n 1  Once     02/20/18 1057           Discharge Care Instructions  (From admission, onward)         Start     Ordered   02/21/18 0000  Change dressing    Comments:  Change dressing, if drainage exceeds 40% of window   02/21/18 0756          Diagnostic Studies: No results found.  Disposition:   Discharge Instructions    Call MD / Call 911   Complete by:  As directed    If you experience chest pain or shortness of breath, CALL 911 and be transported to the hospital emergency room.  If you develope a fever above 101 F, pus (white drainage) or increased drainage or redness at the wound, or calf pain, call your surgeon's office.   Change dressing   Complete by:  As directed    Change dressing, if drainage exceeds 40% of window   Constipation Prevention   Complete by:  As directed    Drink plenty of fluids.  Prune juice may be helpful.  You may  use a stool softener, such as Colace (over the counter) 100 mg twice a day.  Use MiraLax (over the counter) for constipation as needed.   Diet - low sodium heart healthy   Complete by:  As directed    Increase activity slowly as tolerated   Complete by:  As directed       Follow-up Information    Frederik Pear, MD In 2 weeks.   Specialty:  Orthopedic Surgery Contact information: [place] [address] [phone]        [facility] Follow up.   Specialty:  [place] Why:  physical therapy Contact information: [address] [address] [phone]            Signed: Joanell Rising 02/22/2018, 7:52 AM

## 2018-02-22 NOTE — Progress Notes (Signed)
Physical Therapy Treatment Patient Details Name: David Lindsey MRN: [id number] DOB: 01-17-1956 Today's Date: 02/22/2018    History of Present Illness Pt is a 62 year old male s/p L TKA with hx of R TKA in 2011, DMII and peripheral neuropathy.    PT Comments    Pt reports pain much improved today.  Pt ambulated in hallway, practiced safe stair technique, and performed LE exercises.  Pt provided with HEP and stair technique handouts.  Pt reports understanding and feels ready for d/c home today.    Follow Up Recommendations  Follow surgeon's recommendation for DC plan and follow-up therapies     Equipment Recommendations  Rolling walker with 5" wheels    Recommendations for Other Services       Precautions / Restrictions Precautions Precautions: Knee;Fall Restrictions Other Position/Activity Restrictions: WBAT    Mobility  Bed Mobility   Bed Mobility: Supine to Sit     Supine to sit: Supervision;HOB elevated        Transfers Overall transfer level: Needs assistance Equipment used: Rolling walker (2 wheeled) Transfers: Sit to/from Stand Sit to Stand: Min guard         General transfer comment: verbal cues for UE and LE positioning  Ambulation/Gait Ambulation/Gait assistance: Min guard Gait Distance (Feet): 140 Feet Assistive device: Rolling walker (2 wheeled) Gait Pattern/deviations: Step-to pattern;Decreased stance time - left;Antalgic     General Gait Details: verbal cues for sequence, RW positioning, step length, posture   Stairs Stairs: Yes Stairs assistance: Min guard Stair Management: Step to pattern;Backwards;With walker Number of Stairs: 3 General stair comments: verbal cues for sequence, RW positioning, safety, provided handout for spouse   Wheelchair Mobility    Modified Rankin (Stroke Patients Only)       Balance                                            Cognition Arousal/Alertness: Awake/alert Behavior  During Therapy: WFL for tasks assessed/performed Overall Cognitive Status: Within Functional Limits for tasks assessed                                        Exercises Total Joint Exercises Ankle Circles/Pumps: AROM;10 reps;Both Quad Sets: AROM;10 reps;Left Towel Squeeze: AROM;10 reps;Both Short Arc Quad: AAROM;10 reps;Left Heel Slides: AAROM;10 reps;Left Hip ABduction/ADduction: AAROM;10 reps;Left Straight Leg Raises: AAROM;10 reps;Left    General Comments        Pertinent Vitals/Pain Pain Assessment: 0-10 Pain Score: 4  Pain Location: L knee Pain Descriptors / Indicators: Aching;Sore Pain Intervention(s): Limited activity within patient's tolerance;Repositioned;Monitored during session;Premedicated before session;Ice applied    Home Living                      Prior Function            PT Goals (current goals can now be found in the care plan section) Progress towards PT goals: Progressing toward goals    Frequency    7X/week      PT Plan Current plan remains appropriate    Co-evaluation              AM-PAC PT "6 Clicks" Daily Activity  Outcome Measure  Difficulty turning over in bed (including adjusting bedclothes, sheets and blankets)?: A Little  Difficulty moving from lying on back to sitting on the side of the bed? : A Little Difficulty sitting down on and standing up from a chair with arms (e.g., wheelchair, bedside commode, etc,.)?: A Little Help needed moving to and from a bed to chair (including a wheelchair)?: A Little Help needed walking in hospital room?: A Little Help needed climbing 3-5 steps with a railing? : A Little 6 Click Score: 18    End of Session Equipment Utilized During Treatment: Gait belt Activity Tolerance: Patient tolerated treatment well Patient left: in chair;with call bell/phone within reach;with family/visitor present Nurse Communication: Mobility status PT Visit Diagnosis: Other  abnormalities of gait and mobility (R26.89)     Time: 2811-8867 PT Time Calculation (min) (ACUTE ONLY): 25 min  Charges:  $Gait Training: 8-22 mins $Therapeutic Exercise: 8-22 mins                    Carmelia Bake, PT, DPT Acute Rehabilitation Services Office: [phone] Pager: [phone]  Trena Platt 02/22/2018, 3:32 PM

## 2018-02-22 NOTE — Progress Notes (Signed)
Reviewed discharge instructions, prescriptions and medications. Patient verbalizes understanding and has no further questions at this time.

## 2018-03-03 ENCOUNTER — Other Ambulatory Visit: Payer: Self-pay | Admitting: Endocrinology

## 2018-03-04 ENCOUNTER — Other Ambulatory Visit: Payer: Self-pay | Admitting: Endocrinology

## 2018-03-05 NOTE — Telephone Encounter (Signed)
Please refill x 3 months Further refills would have to be considered by new PCP   

## 2018-03-23 ENCOUNTER — Other Ambulatory Visit: Payer: Self-pay | Admitting: Endocrinology

## 2018-03-23 DIAGNOSIS — Z0000 Encounter for general adult medical examination without abnormal findings: Secondary | ICD-10-CM

## 2018-03-23 DIAGNOSIS — D61818 Other pancytopenia: Secondary | ICD-10-CM

## 2018-03-23 DIAGNOSIS — E1122 Type 2 diabetes mellitus with diabetic chronic kidney disease: Secondary | ICD-10-CM

## 2018-03-23 DIAGNOSIS — E7800 Pure hypercholesterolemia, unspecified: Secondary | ICD-10-CM

## 2018-03-23 DIAGNOSIS — E291 Testicular hypofunction: Secondary | ICD-10-CM

## 2018-03-23 DIAGNOSIS — N181 Chronic kidney disease, stage 1: Secondary | ICD-10-CM

## 2018-03-23 DIAGNOSIS — E876 Hypokalemia: Secondary | ICD-10-CM

## 2018-03-23 DIAGNOSIS — Z125 Encounter for screening for malignant neoplasm of prostate: Secondary | ICD-10-CM

## 2018-03-24 NOTE — Telephone Encounter (Signed)
Please refill x 3 months Further refills would have to be considered by new PCP   

## 2018-03-24 NOTE — Telephone Encounter (Signed)
Please refill if appropriate

## 2018-04-21 ENCOUNTER — Other Ambulatory Visit: Payer: Self-pay | Admitting: Endocrinology

## 2018-04-21 DIAGNOSIS — Z125 Encounter for screening for malignant neoplasm of prostate: Secondary | ICD-10-CM

## 2018-04-21 DIAGNOSIS — D61818 Other pancytopenia: Secondary | ICD-10-CM

## 2018-04-21 DIAGNOSIS — E1122 Type 2 diabetes mellitus with diabetic chronic kidney disease: Secondary | ICD-10-CM

## 2018-04-21 DIAGNOSIS — E876 Hypokalemia: Secondary | ICD-10-CM

## 2018-04-21 DIAGNOSIS — E291 Testicular hypofunction: Secondary | ICD-10-CM

## 2018-04-21 DIAGNOSIS — Z0000 Encounter for general adult medical examination without abnormal findings: Secondary | ICD-10-CM

## 2018-04-21 DIAGNOSIS — E7800 Pure hypercholesterolemia, unspecified: Secondary | ICD-10-CM

## 2018-04-21 DIAGNOSIS — N181 Chronic kidney disease, stage 1: Secondary | ICD-10-CM

## 2018-04-21 NOTE — Telephone Encounter (Signed)
Please advise if refill is appropriate 

## 2018-05-01 ENCOUNTER — Encounter: Payer: PRIVATE HEALTH INSURANCE | Admitting: Family

## 2018-05-02 ENCOUNTER — Telehealth: Payer: Self-pay | Admitting: Family

## 2018-05-02 ENCOUNTER — Ambulatory Visit: Payer: PRIVATE HEALTH INSURANCE | Admitting: Family

## 2018-05-02 ENCOUNTER — Encounter: Payer: Self-pay | Admitting: Family

## 2018-05-02 VITALS — BP 126/82 | HR 90 | Temp 98.1°F | Ht 71.0 in | Wt 284.6 lb

## 2018-05-02 DIAGNOSIS — R609 Edema, unspecified: Secondary | ICD-10-CM | POA: Diagnosis not present

## 2018-05-02 DIAGNOSIS — E291 Testicular hypofunction: Secondary | ICD-10-CM | POA: Diagnosis not present

## 2018-05-02 DIAGNOSIS — G4700 Insomnia, unspecified: Secondary | ICD-10-CM | POA: Diagnosis not present

## 2018-05-02 DIAGNOSIS — I10 Essential (primary) hypertension: Secondary | ICD-10-CM | POA: Diagnosis not present

## 2018-05-02 DIAGNOSIS — Z23 Encounter for immunization: Secondary | ICD-10-CM

## 2018-05-02 MED ORDER — LISINOPRIL 5 MG PO TABS: 5.0000 mg | ORAL_TABLET | Freq: Every day | ORAL | 3 refills | Status: DC

## 2018-05-02 MED ORDER — OMEPRAZOLE 20 MG PO CPDR: 20.0000 mg | DELAYED_RELEASE_CAPSULE | Freq: Every day | ORAL | 3 refills | Status: DC

## 2018-05-02 MED ORDER — TRAZODONE HCL 50 MG PO TABS: 50.0000 mg | ORAL_TABLET | Freq: Every day | ORAL | 3 refills | Status: DC

## 2018-05-02 MED ORDER — SILDENAFIL CITRATE 100 MG PO TABS: ORAL_TABLET | ORAL | 6 refills | Status: DC

## 2018-05-02 MED ORDER — TRAZODONE HCL 100 MG PO TABS: 100.0000 mg | ORAL_TABLET | Freq: Every day | ORAL | 3 refills | Status: DC

## 2018-05-02 MED ORDER — FUROSEMIDE 20 MG PO TABS: 20.0000 mg | ORAL_TABLET | Freq: Every day | ORAL | 3 refills | Status: DC

## 2018-05-02 MED ORDER — METOPROLOL SUCCINATE ER 25 MG PO TB24: 12.5000 mg | ORAL_TABLET | Freq: Every day | ORAL | 3 refills | Status: DC

## 2018-05-02 NOTE — Progress Notes (Signed)
David Lindsey is a 63 y.o. male with the following history as recorded in Epic[employer]:  Patient Active Problem List   Diagnosis Date Noted  . 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 Degenerative arthritis of left knee 11/18/2017  . Left shoulder pain 08/29/2017  . History of total right knee replacement (TKR) 03/25/2017  . Arthralgia 02/24/2017  . Edema 01/13/2017  . Wellness examination 01/30/2015  . Myalgia and myositis 01/28/2014  . Unspecified constipation 03/13/2013  . Screening for prostate cancer 01/18/2013  . Diabetes mellitus with renal manifestation ([place]) 01/18/2013  . Encounter for long-term (current) use of other medications 03/24/2012  . HYPOKALEMIA 06/06/2008  . Pancytopenia ([place]) 06/06/2008  . Hypogonadism, male 12/22/2007  . HYPERCHOLESTEROLEMIA 12/22/2007  . ALCOHOLISM 12/22/2007  . ESOPHAGITIS, REFLUX 12/22/2007  . Leg pain 12/22/2007  . Essential hypertension 11/10/2006  . Disorder resulting from impaired renal function 11/10/2006    Current Outpatient Medications  Medication Sig Dispense Refill  . Apple Cider Vinegar 600 MG CAPS Take 600 mg by mouth daily.    . Ascorbic Acid (VITAMIN C) 1000 MG tablet Take 1,000 mg by mouth every evening.    Marland Kitchen aspirin EC 81 MG tablet Take 1 tablet (81 mg total) by mouth 2 (two) times daily. 60 tablet 0  . Biotin w/ Vitamins C & E (HAIR SKIN & NAILS GUMMIES PO) Take 1 tablet by mouth every evening.    . canagliflozin (INVOKANA) 100 MG TABS tablet Take 1 tablet (100 mg total) daily before breakfast by mouth. (Patient taking differently: Take 100 mg by mouth at bedtime. ) 90 tablet 3  . Carboxymethylcellul-Glycerin (LUBRICATING EYE DROPS OP) Place 1 drop into both eyes daily as needed (dry eyes).    . clotrimazole-betamethasone (LOTRISONE) cream Apply 3x a day as needed for rash (Patient taking differently: Apply 1 application topically 3 (three) times daily as needed (skin irritation.). Apply 3x a day as needed for rash) 30  g 0  . Coenzyme Q10 (COQ-10) 200 MG CAPS Take 200 mg by mouth every evening.     . Cyanocobalamin (B-12) 5000 MCG CAPS Take 5,000 mcg by mouth every evening.     . docusate sodium (COLACE) 100 MG capsule Take 200 mg by mouth every evening.    Marland Kitchen EASY TOUCH PEN NEEDLES 31G X 8 MM MISC USE ONCE DAILY WITH VICTOZA 100 each 11  . gabapentin (NEURONTIN) 300 MG capsule Take 1 capsule (300 mg total) by mouth 3 (three) times daily. 180 capsule 0  . glucose blood (ONE TOUCH ULTRA TEST) test strip Use as instructed check blood sugar two times daily     . insulin lispro (HUMALOG KWIKPEN) 100 UNIT/ML KiwkPen Inject 0.1 mLs (10 Units total) into the skin 3 (three) times daily with meals. And pen needles 4/day 15 mL 11  . INVOKANA 100 MG TABS tablet TAKE 1 TABLET BY MOUTH BEFORE BREAKFAST 90 tablet 3  . Menthol, Topical Analgesic, (STOPAIN ROLL-ON) 8 % LIQD Apply 1 application topically 3 (three) times daily as needed (for knee pain.).    Marland Kitchen Methylcellulose, Laxative, (HM FIBER) 500 MG TABS Take 500 mg by mouth 2 (two) times daily. Fiberwell Gummies    . metoprolol succinate (TOPROL-XL) 25 MG 24 hr tablet Take 0.5 tablets (12.5 mg total) by mouth at bedtime. 45 tablet 3  . Multiple Vitamin (MULTIVITAMIN WITH MINERALS) TABS tablet Take 1 tablet by mouth every evening.     . Omega-3-6-9 CAPS Take 1 capsule by mouth every evening.  1600mg     . omeprazole (PRILOSEC) 20 MG capsule Take 1 capsule (20 mg total) by mouth daily. 90 capsule 3  . rosuvastatin (CRESTOR) 5 MG tablet Take 1 tablet (5 mg total) by mouth daily. (Patient taking differently: Take 5 mg by mouth at bedtime. ) 90 tablet 3  . sildenafil (VIAGRA) 100 MG tablet TAKE 1 TABLET BY MOUTH ONCE DAILY AS NEEDED 6 tablet 6  . traZODone (DESYREL) 100 MG tablet Take 1 tablet (100 mg total) by mouth at bedtime. 90 tablet 3  . trolamine salicylate (ASPERCREME) 10 % cream Apply 1 application topically as needed (knee pain).    Marland Kitchen VICTOZA 18 MG/3ML SOPN INJECT 1.8MG   UNDER THE SKIN DAILY (Patient taking differently: Inject 1.8 mg into the skin daily. ) 9 pen 4  . vitamin E 400 UNIT capsule Take 400 Units by mouth every evening.     . furosemide (LASIX) 20 MG tablet Take 1 tablet (20 mg total) by mouth daily. 30 tablet 3  . lisinopril (PRINIVIL,ZESTRIL) 5 MG tablet Take 1 tablet (5 mg total) by mouth daily. 90 tablet 3   No current facility-administered medications for this visit.     Allergies: Pioglitazone  Past Medical History:  Diagnosis Date  . ALCOHOLISM 12/22/2007  . ANEMIA, IRON DEFICIENCY 12/26/2007  . Arthritis    OA  . Blood transfusion without reported diagnosis    as a child  . DIABETES MELLITUS, TYPE II 11/10/2006  . ESOPHAGITIS, REFLUX 12/22/2007  . GERD (gastroesophageal reflux disease)   . HYPERCHOLESTEROLEMIA 12/22/2007  . HYPERTENSION 11/10/2006  . HYPOGONADISM 12/22/2007   Prob. due to ETOH  . HYPOKALEMIA 06/06/2008  . Leukopenia   . Neuropathy    Chronic Neuropathy BOTH FEET  . Pancytopenia 06/06/2008  . RENAL INSUFFICIENCY 11/10/2006   SEES [facility] STAGE 2 CKD    Past Surgical History:  Procedure Laterality Date  . APPENDECTOMY  AGE 29 OR 10  . COLONOSCOPY  08-21-2004   [facility]- normal  . COLONSCOPY  2017  . ELECTROCARDIOGRAM  11/07/2006  . ESOPHAGOGASTRODUODENOSCOPY  08/20/2004  . EYE SURGERY Bilateral 2014   [facility] FOR CATARACTS  . REPLACEMENT TOTAL KNEE Right 2011  . TOTAL KNEE ARTHROPLASTY Left 02/20/2018   Procedure: LEFT TOTAL KNEE ARTHROPLASTY;  Surgeon: Frederik Pear, MD;  Location: [facility];  Service: Orthopedics;  Laterality: Left;  . UPPER GASTROINTESTINAL ENDOSCOPY      Family History  Problem Relation Age of Onset  . Cancer Mother        Colon Cancer  . Colon cancer Mother   . Colon polyps Brother   . Esophageal cancer Neg Hx   . Rectal cancer Neg Hx   . Stomach cancer Neg Hx     Social History   Tobacco Use  . Smoking status: Never Smoker  . Smokeless tobacco: Never Used  Substance Use Topics  .  Alcohol use: No    Alcohol/week: 0.0 standard drinks    Comment: LAST USE 10 YRS AGO    Subjective:  Patient presents to transfer care from Dr. Loanne Drilling;  History of Type 2 Diabetes- will continue care with Dr. Loanne Drilling; last Hgba1c at 7.1 Requesting refills today on medications including: Prilosec, Metoprolol and Viagra; also needs updated Rx for Trazodone to local pharmacy.  Scheduled to see orthopedist in February in follow-up on left knee replacement;  Notes that has had problems with swelling in his left leg/ foot x 1 year; was given Lasix at some time in the  past but not currently taking; Denies any chest pain, shortness of breath, blurred vision or headache. Swelling in leg has not changed since left knee replacement- symptoms were present prior to replacement/ DVT precautions were put in place for orthopedic surgery.     Objective:  Vitals:   05/02/18 1324  BP: 126/82  Pulse: 90  Temp: 98.1 F (36.7 C)  TempSrc: Oral  SpO2: 98%  Weight: 284 lb 9.6 oz (129.1 kg)  Height: 5\' 11"  (1.803 m)    General: Well developed, well nourished, in no acute distress  Skin : Warm and dry.  Head: Normocephalic and atraumatic  Eyes: Sclera and conjunctiva clear; pupils round and reactive to light; extraocular movements intact  Ears: External normal; canals clear; tympanic membranes normal  Oropharynx: Pink, supple. No suspicious lesions  Neck: Supple without thyromegaly, adenopathy  Lungs: Respirations unlabored; clear to auscultation bilaterally without wheeze, rales, rhonchi  CVS exam: normal rate and regular rhythm.  Musculoskeletal: No deformities; no active joint inflammation  Extremities: No edema, cyanosis, clubbing  Vessels: Symmetric bilaterally  Neurologic: Alert and oriented; speech intact; face symmetrical; moves all extremities well; CNII-XII intact without focal deficit   Assessment:  1. Essential hypertension   2. Edema, unspecified type   3. Hypogonadism, male   4.  Insomnia, unspecified type     Plan:  1. & 2. Patient has CKD- does have nephrologist but patient is unsure who his provider is; need to reach out to nephrologist to discuss trial of daily Lasix to help with the swelling and changing to Lisinopril 5 mg daily; in the interim, patient will stay on Lisinopril HCT 10/12.5 mg take 1/2 tablet daily and Metoprolol 12. 5 mg at night; also need to consider vascular studies of leg as well as abdominal imaging since swelling is localized in one leg. Follow up in 2 weeks, sooner prn. 3. Refill given on Viagra as requested. 4. Refill on Trazodone 100 mg daily;    Return in about 2 weeks (around 05/16/2018).  No orders of the defined types were placed in this encounter.   Requested Prescriptions   Signed Prescriptions Disp Refills  . metoprolol succinate (TOPROL-XL) 25 MG 24 hr tablet 45 tablet 3    Sig: Take 0.5 tablets (12.5 mg total) by mouth at bedtime.  Marland Kitchen omeprazole (PRILOSEC) 20 MG capsule 90 capsule 3    Sig: Take 1 capsule (20 mg total) by mouth daily.  . sildenafil (VIAGRA) 100 MG tablet 6 tablet 6    Sig: TAKE 1 TABLET BY MOUTH ONCE DAILY AS NEEDED  . traZODone (DESYREL) 100 MG tablet 90 tablet 3    Sig: Take 1 tablet (100 mg total) by mouth at bedtime.  Marland Kitchen lisinopril (PRINIVIL,ZESTRIL) 5 MG tablet 90 tablet 3    Sig: Take 1 tablet (5 mg total) by mouth daily.  . furosemide (LASIX) 20 MG tablet 30 tablet 3    Sig: Take 1 tablet (20 mg total) by mouth daily.

## 2018-05-02 NOTE — Addendum Note (Signed)
Addended by: Marcina Millard on: 05/02/2018 03:56 PM   Modules accepted: Orders

## 2018-05-02 NOTE — Telephone Encounter (Signed)
I think we should have him go back to see his nephrologist to discuss the safety of the Lasix. I called Dr. Marikay Alar office and he was due for follow-up there in December 2019. He should go ahead and schedule a follow-up there before he returns to work. In the interim, stay on the regimen of 1/2 Lisinopril HCT and 1/2 Metoprolol.  The swelling in the one leg is concerning. I sat down and looked at the chart more closely. This has been at least one year if not longer. Normally we recommend check the blood flow and getting picture of the abdomen to see why there is such a difference in the size of the 2 legs. Is he okay with me scheduling these tests for him?

## 2018-05-02 NOTE — Telephone Encounter (Signed)
Patient states that his nephrologist is located at [facility].  [phone].  Dr. Perfecto Kingdom. Dhandar.

## 2018-05-02 NOTE — Telephone Encounter (Signed)
Patient sees Dr. Deberah Pelton with [facility].

## 2018-05-02 NOTE — Patient Instructions (Signed)
New blood pressure regimen:  Lisinopril 5 mg/ Lasix 20 mg ( take in am) Metoprolol at night;

## 2018-05-02 NOTE — Telephone Encounter (Signed)
Who is his nephrologist? Please ask him to hold off on starting the Lasix until I talk to his nephrologist about using this medication.  For now, go back on the Lisinopril HCT that he was doing originally. I will let him know when to start using the medications I changed today.

## 2018-05-11 LAB — HM DIABETES EYE EXAM

## 2018-05-19 ENCOUNTER — Ambulatory Visit: Payer: PRIVATE HEALTH INSURANCE | Admitting: Family

## 2018-05-22 ENCOUNTER — Encounter: Payer: Self-pay | Admitting: Endocrinology

## 2018-05-22 ENCOUNTER — Ambulatory Visit (INDEPENDENT_AMBULATORY_CARE_PROVIDER_SITE_OTHER): Payer: PRIVATE HEALTH INSURANCE | Admitting: Endocrinology

## 2018-05-22 ENCOUNTER — Other Ambulatory Visit: Payer: Self-pay

## 2018-05-22 ENCOUNTER — Telehealth: Payer: Self-pay | Admitting: Endocrinology

## 2018-05-22 VITALS — BP 132/86 | HR 70 | Temp 98.0°F | Resp 16 | Wt 282.0 lb

## 2018-05-22 DIAGNOSIS — E1122 Type 2 diabetes mellitus with diabetic chronic kidney disease: Secondary | ICD-10-CM

## 2018-05-22 DIAGNOSIS — N181 Chronic kidney disease, stage 1: Secondary | ICD-10-CM | POA: Diagnosis not present

## 2018-05-22 LAB — GLUCOSE, POCT (MANUAL RESULT ENTRY): POC GLUCOSE: 7.8 mg/dL — AB (ref 70–99)

## 2018-05-22 MED ORDER — CANAGLIFLOZIN 300 MG PO TABS: 300.0000 mg | ORAL_TABLET | Freq: Every day | ORAL | 11 refills | Status: DC

## 2018-05-22 NOTE — Patient Instructions (Signed)
I have sent a prescription to your pharmacy, to increase the invokana. Please continue the same other diabetes medications. Please redo the potassium and kidney blood test next week. check your blood sugar twice a day.  vary the time of day when you check, between before the 3 meals, and at bedtime.  also check if you have symptoms of your blood sugar being too high or too low.  please keep a record of the readings and bring it to your next appointment here (or you can bring the meter itself).  You can write it on any piece of paper.  please call us sooner if your blood sugar goes below 70, or if you have a lot of readings over 200. Please come back for a follow-up appointment in 2 months.

## 2018-05-22 NOTE — Telephone Encounter (Signed)
Rx has been sent to correct pharmacy. 

## 2018-05-22 NOTE — Telephone Encounter (Signed)
canagliflozin (INVOKANA) 300 MG TABS tablet   Patient stated that his prescription was sent to the incorrect pharmacy and would like it sent to the [facility], [place]

## 2018-05-22 NOTE — Progress Notes (Signed)
Subjective:    Patient ID: David Lindsey, male    DOB: 06-27-1955, 63 y.o.   MRN: [id number]  HPI Pt returns for f/u of diabetes mellitus: DM type: Insulin-requiring type 2.   Dx'ed: 6301 Complications: renal insufficiency Therapy: insulin since 2019, victoza, and 2 oral meds DKA: never.   Severe hypoglycemia: never.   Pancreatitis: never.   Other: he has never been on insulin; he did not tolerate parlodel (tremor); he cannot take pioglitizone (edema); he works 3rd shift.   Interval history: pt says he takes meds as rx'ed.  no cbg record, but states cbg's are in the mid-100's.   Past Medical History:  Diagnosis Date  . ALCOHOLISM 12/22/2007  . ANEMIA, IRON DEFICIENCY 12/26/2007  . Arthritis    OA  . Blood transfusion without reported diagnosis    as a child  . DIABETES MELLITUS, TYPE II 11/10/2006  . ESOPHAGITIS, REFLUX 12/22/2007  . GERD (gastroesophageal reflux disease)   . HYPERCHOLESTEROLEMIA 12/22/2007  . HYPERTENSION 11/10/2006  . HYPOGONADISM 12/22/2007   Prob. due to ETOH  . HYPOKALEMIA 06/06/2008  . Leukopenia   . Neuropathy    Chronic Neuropathy BOTH FEET  . Pancytopenia 06/06/2008  . 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 APPENDECTOMY  AGE 59 OR 10  . COLONOSCOPY  08-21-2004   [facility]- normal  . COLONSCOPY  2017  . ELECTROCARDIOGRAM  11/07/2006  . ESOPHAGOGASTRODUODENOSCOPY  08/20/2004  . EYE SURGERY Bilateral 2014   [facility] FOR CATARACTS  . REPLACEMENT TOTAL KNEE Right 2011  . TOTAL KNEE ARTHROPLASTY Left 02/20/2018   Procedure: LEFT TOTAL KNEE ARTHROPLASTY;  Surgeon: Frederik Pear, MD;  Location: [facility];  Service: Orthopedics;  Laterality: Left;  . UPPER GASTROINTESTINAL ENDOSCOPY      Social History   Socioeconomic History  . Marital status: Married    Spouse name: Not on file  . Number of children: Not on file  . Years of education: Not on file  . Highest education level: Not  on file  Occupational History  . 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 Financial resource strain: Not on file  . Food insecurity:    Worry: Not on file    Inability: Not on file  . Transportation needs:    Medical: Not on file    Non-medical: Not on file  Tobacco Use  . Smoking status: Never Smoker  . Smokeless tobacco: Never Used  Substance and Sexual Activity  . Alcohol use: No    Alcohol/week: 0.0 standard drinks    Comment: LAST USE 10 YRS AGO  . Drug use: No  . Sexual activity: Not on file  Lifestyle  . Physical activity:    Days per week: Not on file    Minutes per session: Not on file  . Stress: Not on file  Relationships  . Social connections:    Talks on phone: Not on file    Gets together: Not on file    Attends religious service: Not on file    Active member of club or organization: Not on file    Attends meetings of clubs or organizations: Not on file    Relationship status: Not on file  . Intimate partner violence:    Fear of current or ex partner: Not on file    Emotionally abused: Not on file    Physically abused: Not  on file    Forced sexual activity: Not on file  Other Topics Concern  . Not on file  Social History Narrative  . Not on file    Current Outpatient Medications on File Prior to Visit  Medication Sig Dispense Refill  . Apple Cider Vinegar 600 MG CAPS Take 600 mg by mouth daily.    . Ascorbic Acid (VITAMIN C) 1000 MG tablet Take 1,000 mg by mouth every evening.    Marland Kitchen aspirin EC 81 MG tablet Take 1 tablet (81 mg total) by mouth 2 (two) times daily. 60 tablet 0  . Biotin w/ Vitamins C & E (HAIR SKIN & NAILS GUMMIES PO) Take 1 tablet by mouth every evening.    . Carboxymethylcellul-Glycerin (LUBRICATING EYE DROPS OP) Place 1 drop into both eyes daily as needed (dry eyes).    . clotrimazole-betamethasone (LOTRISONE) cream Apply 3x a day as needed for rash (Patient taking differently: Apply 1 application topically 3  (three) times daily as needed (skin irritation.). Apply 3x a day as needed for rash) 30 g 0  . Coenzyme Q10 (COQ-10) 200 MG CAPS Take 200 mg by mouth every evening.     . Cyanocobalamin (B-12) 5000 MCG CAPS Take 5,000 mcg by mouth every evening.     . docusate sodium (COLACE) 100 MG capsule Take 200 mg by mouth every evening.    Marland Kitchen EASY TOUCH PEN NEEDLES 31G X 8 MM MISC USE ONCE DAILY WITH VICTOZA 100 each 11  . furosemide (LASIX) 20 MG tablet Take 1 tablet (20 mg total) by mouth daily. 30 tablet 3  . gabapentin (NEURONTIN) 300 MG capsule Take 1 capsule (300 mg total) by mouth 3 (three) times daily. 180 capsule 0  . glucose blood (ONE TOUCH ULTRA TEST) test strip Use as instructed check blood sugar two times daily     . insulin lispro (HUMALOG KWIKPEN) 100 UNIT/ML KiwkPen Inject 0.1 mLs (10 Units total) into the skin 3 (three) times daily with meals. And pen needles 4/day 15 mL 11  . lisinopril (PRINIVIL,ZESTRIL) 5 MG tablet Take 1 tablet (5 mg total) by mouth daily. 90 tablet 3  . Menthol, Topical Analgesic, (STOPAIN ROLL-ON) 8 % LIQD Apply 1 application topically 3 (three) times daily as needed (for knee pain.).    Marland Kitchen Methylcellulose, Laxative, (HM FIBER) 500 MG TABS Take 500 mg by mouth 2 (two) times daily. Fiberwell Gummies    . metoprolol succinate (TOPROL-XL) 25 MG 24 hr tablet Take 0.5 tablets (12.5 mg total) by mouth at bedtime. 45 tablet 3  . Multiple Vitamin (MULTIVITAMIN WITH MINERALS) TABS tablet Take 1 tablet by mouth every evening.     . Omega-3-6-9 CAPS Take 1 capsule by mouth every evening. 1600mg     . omeprazole (PRILOSEC) 20 MG capsule Take 1 capsule (20 mg total) by mouth daily. 90 capsule 3  . rosuvastatin (CRESTOR) 5 MG tablet Take 1 tablet (5 mg total) by mouth daily. (Patient taking differently: Take 5 mg by mouth at bedtime. ) 90 tablet 3  . sildenafil (VIAGRA) 100 MG tablet TAKE 1 TABLET BY MOUTH ONCE DAILY AS NEEDED 6 tablet 6  . traZODone (DESYREL) 100 MG tablet Take 1  tablet (100 mg total) by mouth at bedtime. 90 tablet 3  . trolamine salicylate (ASPERCREME) 10 % cream Apply 1 application topically as needed (knee pain).    Marland Kitchen VICTOZA 18 MG/3ML SOPN INJECT 1.8MG  UNDER THE SKIN DAILY (Patient taking differently: Inject 1.8 mg into the skin daily. )  9 pen 4  . vitamin E 400 UNIT capsule Take 400 Units by mouth every evening.      No current facility-administered medications on file prior to visit.     Allergies  Allergen Reactions  . Pioglitazone Swelling    Family History  Problem Relation Age of Onset  . Cancer Mother        Colon Cancer  . Colon cancer Mother   . Colon polyps Brother   . Esophageal cancer Neg Hx   . Rectal cancer Neg Hx   . Stomach cancer Neg Hx     BP 132/86   Pulse 70   Temp 98 F (36.7 C) (Oral)   Resp 16   Wt 282 lb (127.9 kg)   SpO2 98%   BMI 39.33 kg/m    Review of Systems He denies hypoglycemia    Objective:   Physical Exam VITAL SIGNS:  See vs page.  GENERAL: no distress Pulses: dorsalis pedis intact bilat.   MSK: no deformity of the feet CV: 1+ left leg edema (none on the right), and bilat vv's Skin:  no ulcer on the feet.  normal color and temp on the feet. Neuro: sensation is intact to touch on the feet.  Ext: There is bilateral onychomycosis of the toenails.   Lab Results  Component Value Date   CREATININE 2.08 (H) 02/21/2018   BUN 21 02/21/2018   NA 132 (L) 02/21/2018   K 4.3 02/21/2018   CL 98 02/21/2018   CO2 25 02/21/2018     A1c=7.8%    Assessment & Plan:  Insulin-requiring type 2 DM: worse Edema: increased invokana will help Renal insuff: we have to follow this up soon.  We discussed.  Patient Instructions  I have sent a prescription to your pharmacy, to increase the invokana. Please continue the same other diabetes medications. Please redo the potassium and kidney blood test next week. check your blood sugar twice a day.  vary the time of day when you check, between before  the 3 meals, and at bedtime.  also check if you have symptoms of your blood sugar being too high or too low.  please keep a record of the readings and bring it to your next appointment here (or you can bring the meter itself).  You can write it on any piece of paper.  please call us sooner if your blood sugar goes below 70, or if you have a lot of readings over 200. Please come back for a follow-up appointment in 2 months.

## 2018-05-28 ENCOUNTER — Other Ambulatory Visit: Payer: Self-pay | Admitting: Endocrinology

## 2018-05-31 ENCOUNTER — Other Ambulatory Visit: Payer: Self-pay | Admitting: Family

## 2018-05-31 MED ORDER — LISINOPRIL-HYDROCHLOROTHIAZIDE 10-12.5 MG PO TABS: 0.5000 | ORAL_TABLET | Freq: Every day | ORAL | 1 refills | Status: DC

## 2018-06-06 ENCOUNTER — Other Ambulatory Visit: Payer: Self-pay | Admitting: Family

## 2018-06-06 MED ORDER — GABAPENTIN 300 MG PO CAPS: 300.0000 mg | ORAL_CAPSULE | Freq: Three times a day (TID) | ORAL | 0 refills | Status: DC

## 2018-07-27 ENCOUNTER — Ambulatory Visit: Payer: PRIVATE HEALTH INSURANCE | Admitting: Endocrinology

## 2018-08-03 ENCOUNTER — Other Ambulatory Visit: Payer: Self-pay | Admitting: Endocrinology

## 2018-08-31 ENCOUNTER — Ambulatory Visit: Payer: PRIVATE HEALTH INSURANCE | Admitting: Endocrinology

## 2018-09-05 ENCOUNTER — Other Ambulatory Visit: Payer: Self-pay | Admitting: Endocrinology

## 2018-09-05 ENCOUNTER — Other Ambulatory Visit: Payer: Self-pay

## 2018-09-07 ENCOUNTER — Encounter: Payer: Self-pay | Admitting: Endocrinology

## 2018-09-07 ENCOUNTER — Other Ambulatory Visit: Payer: Self-pay

## 2018-09-07 ENCOUNTER — Ambulatory Visit (INDEPENDENT_AMBULATORY_CARE_PROVIDER_SITE_OTHER): Payer: PRIVATE HEALTH INSURANCE | Admitting: Endocrinology

## 2018-09-07 VITALS — BP 160/90 | HR 88 | Temp 98.0°F | Wt 280.4 lb

## 2018-09-07 DIAGNOSIS — E1122 Type 2 diabetes mellitus with diabetic chronic kidney disease: Secondary | ICD-10-CM | POA: Diagnosis not present

## 2018-09-07 DIAGNOSIS — N181 Chronic kidney disease, stage 1: Secondary | ICD-10-CM

## 2018-09-07 LAB — BASIC METABOLIC PANEL
BUN: 17 mg/dL (ref 6–23)
CO2: 27 mEq/L (ref 19–32)
Calcium: 9.3 mg/dL (ref 8.4–10.5)
Chloride: 103 mEq/L (ref 96–112)
Creatinine, Ser: 1.77 mg/dL — ABNORMAL HIGH (ref 0.40–1.50)
GFR: 47 mL/min — ABNORMAL LOW (ref >60.00)
Glucose, Bld: 209 mg/dL — ABNORMAL HIGH (ref 70–99)
Potassium: 4.1 mEq/L (ref 3.5–5.1)
Sodium: 137 mEq/L (ref 135–145)

## 2018-09-07 LAB — POCT GLYCOSYLATED HEMOGLOBIN (HGB A1C): Hemoglobin A1C: 8.4 % — AB (ref 4.0–5.6)

## 2018-09-07 MED ORDER — DAPAGLIFLOZIN PROPANEDIOL 10 MG PO TABS: 10.0000 mg | ORAL_TABLET | Freq: Every day | ORAL | 11 refills | Status: DC

## 2018-09-07 MED ORDER — INSULIN LISPRO PROT & LISPRO (75-25 MIX) 100 UNIT/ML KWIKPEN: 25.0000 [IU] | PEN_INJECTOR | Freq: Every day | SUBCUTANEOUS | 11 refills | Status: DC

## 2018-09-07 NOTE — Patient Instructions (Addendum)
Your blood pressure is high today.  Please see your primary care provider soon, to have it rechecked Please change the insulin to "75/25," 25 units with supper. Please continue the same other diabetes medications.   Here is a prescription for "Wilder Glade," for when the insurance wants you to change.   check your blood sugar twice a day.  vary the time of day when you check, between before the 3 meals, and at bedtime.  also check if you have symptoms of your blood sugar being too high or too low.  please keep a record of the readings and bring it to your next appointment here (or you can bring the meter itself).  You can write it on any piece of paper.  please call us sooner if your blood sugar goes below 70, or if you have a lot of readings over 200.

## 2018-09-07 NOTE — Progress Notes (Signed)
Subjective:    Patient ID: David Lindsey, male    DOB: March 31, 1956, 63 y.o.   MRN: [id number]  HPI Pt returns for f/u of diabetes mellitus: DM type: Insulin-requiring type 2.   Dx'ed: 4098 Complications: renal insufficiency and PN Therapy: insulin since 2019, victoza, and 2 oral meds.   DKA: never.   Severe hypoglycemia: never.   Pancreatitis: never.   Other: he did not tolerate parlodel (tremor); he cannot take pioglitizone (edema); he works 3rd shift.   Interval history: pt says he misses the insulin at least 1 dose per day.  no cbg record, but states cbg's are in the mid-100's.   Past Medical History:  Diagnosis Date  . ALCOHOLISM 12/22/2007  . ANEMIA, IRON DEFICIENCY 12/26/2007  . Arthritis    OA  . Blood transfusion without reported diagnosis    as a child  . DIABETES MELLITUS, TYPE II 11/10/2006  . ESOPHAGITIS, REFLUX 12/22/2007  . GERD (gastroesophageal reflux disease)   . HYPERCHOLESTEROLEMIA 12/22/2007  . HYPERTENSION 11/10/2006  . HYPOGONADISM 12/22/2007   Prob. due to ETOH  . HYPOKALEMIA 06/06/2008  . Leukopenia   . Neuropathy    Chronic Neuropathy BOTH FEET  . Pancytopenia 06/06/2008  . RENAL INSUFFICIENCY 11/10/2006   SEES [facility] STAGE 2 CKD    Past Surgical History:  Procedure Laterality Date  . APPENDECTOMY  AGE 46 OR 10  . COLONOSCOPY  08-21-2004   [facility]- normal  . COLONSCOPY  2017  . ELECTROCARDIOGRAM  11/07/2006  . ESOPHAGOGASTRODUODENOSCOPY  08/20/2004  . EYE SURGERY Bilateral 2014   [facility] FOR CATARACTS  . REPLACEMENT TOTAL KNEE Right 2011  . TOTAL KNEE ARTHROPLASTY Left 02/20/2018   Procedure: LEFT TOTAL KNEE ARTHROPLASTY;  Surgeon: Frederik Pear, MD;  Location: [facility];  Service: Orthopedics;  Laterality: Left;  . UPPER GASTROINTESTINAL ENDOSCOPY      Social History   Socioeconomic History  . Marital status: Married    Spouse name: Not on file  . Number of children: Not on file  . Years of education: Not on file  . Highest education level:  Not on file  Occupational History  . 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 Financial resource strain: Not on file  . Food insecurity:    Worry: Not on file    Inability: Not on file  . Transportation needs:    Medical: Not on file    Non-medical: Not on file  Tobacco Use  . Smoking status: Never Smoker  . Smokeless tobacco: Never Used  Substance and Sexual Activity  . Alcohol use: No    Alcohol/week: 0.0 standard drinks    Comment: LAST USE 10 YRS AGO  . Drug use: No  . Sexual activity: Not on file  Lifestyle  . Physical activity:    Days per week: Not on file    Minutes per session: Not on file  . Stress: Not on file  Relationships  . Social connections:    Talks on phone: Not on file    Gets together: Not on file    Attends religious service: Not on file    Active member of club or organization: Not on file    Attends meetings of clubs or organizations: Not on file    Relationship status: Not on file  . Intimate partner violence:    Fear of current or ex partner: Not on file    Emotionally abused: Not on file  Physically abused: Not on file    Forced sexual activity: Not on file  Other Topics Concern  . Not on file  Social History Narrative  . Not on file    Current Outpatient Medications on File Prior to Visit  Medication Sig Dispense Refill  . Apple Cider Vinegar 600 MG CAPS Take 600 mg by mouth daily.    . Ascorbic Acid (VITAMIN C) 1000 MG tablet Take 1,000 mg by mouth every evening.    Marland Kitchen aspirin EC 81 MG tablet Take 1 tablet (81 mg total) by mouth 2 (two) times daily. 60 tablet 0  . Biotin w/ Vitamins C & E (HAIR SKIN & NAILS GUMMIES PO) Take 1 tablet by mouth every evening.    . Carboxymethylcellul-Glycerin (LUBRICATING EYE DROPS OP) Place 1 drop into both eyes daily as needed (dry eyes).    . clotrimazole-betamethasone (LOTRISONE) cream APPLY CREAM TOPICALLY THREE TIMES DAILY AS NEEDED FOR RASH 45 g 0  . Coenzyme Q10  (COQ-10) 200 MG CAPS Take 200 mg by mouth every evening.     . Cyanocobalamin (B-12) 5000 MCG CAPS Take 5,000 mcg by mouth every evening.     . docusate sodium (COLACE) 100 MG capsule Take 200 mg by mouth every evening.    Marland Kitchen EASY TOUCH PEN NEEDLES 31G X 8 MM MISC USE ONCE DAILY WITH VICTOZA 100 each 11  . gabapentin (NEURONTIN) 300 MG capsule Take 1 capsule (300 mg total) by mouth 3 (three) times daily. 270 capsule 0  . glucose blood (ONE TOUCH ULTRA TEST) test strip Use as instructed check blood sugar two times daily     . lisinopril-hydrochlorothiazide (PRINZIDE,ZESTORETIC) 10-12.5 MG tablet Take 0.5 tablets by mouth daily. 45 tablet 1  . Menthol, Topical Analgesic, (STOPAIN ROLL-ON) 8 % LIQD Apply 1 application topically 3 (three) times daily as needed (for knee pain.).    Marland Kitchen Methylcellulose, Laxative, (HM FIBER) 500 MG TABS Take 500 mg by mouth 2 (two) times daily. Fiberwell Gummies    . metoprolol succinate (TOPROL-XL) 25 MG 24 hr tablet Take 0.5 tablets (12.5 mg total) by mouth at bedtime. 45 tablet 3  . Multiple Vitamin (MULTIVITAMIN WITH MINERALS) TABS tablet Take 1 tablet by mouth every evening.     . Omega-3-6-9 CAPS Take 1 capsule by mouth every evening. 1600mg     . omeprazole (PRILOSEC) 20 MG capsule Take 1 capsule (20 mg total) by mouth daily. 90 capsule 3  . rosuvastatin (CRESTOR) 5 MG tablet Take 1 tablet (5 mg total) by mouth daily. (Patient taking differently: Take 5 mg by mouth at bedtime. ) 90 tablet 3  . sildenafil (VIAGRA) 100 MG tablet TAKE 1 TABLET BY MOUTH ONCE DAILY AS NEEDED 6 tablet 6  . traZODone (DESYREL) 100 MG tablet Take 1 tablet (100 mg total) by mouth at bedtime. 90 tablet 3  . trolamine salicylate (ASPERCREME) 10 % cream Apply 1 application topically as needed (knee pain).    Marland Kitchen VICTOZA 18 MG/3ML SOPN INJECT 1.8 MG  SUBCUTANEOUSLY ONCE DAILY 9 mL 0  . vitamin E 400 UNIT capsule Take 400 Units by mouth every evening.      No current facility-administered  medications on file prior to visit.     Allergies  Allergen Reactions  . Pioglitazone Swelling    Family History  Problem Relation Age of Onset  . Cancer Mother        Colon Cancer  . Colon cancer Mother   . Colon polyps Brother   .  Esophageal cancer Neg Hx   . Rectal cancer Neg Hx   . Stomach cancer Neg Hx     BP (!) 160/90 (BP Location: Left Arm, Patient Position: Sitting, Cuff Size: Normal)   Pulse 88   Temp 98 F (36.7 C) (Oral)   Wt 280 lb 6.4 oz (127.2 kg)   SpO2 97%   BMI 39.11 kg/m    Review of Systems He denies hypoglycemia.      Objective:   Physical Exam VITAL SIGNS:  See vs page.  GENERAL: no distress Pulses: dorsalis pedis intact bilat.   MSK: no deformity of the feet CV: 2+ left leg edema (1+ on the right). Skin:  no ulcer on the feet.  normal color and temp on the feet. Neuro: sensation is intact to touch on the feet.  Ext: There is bilateral onychomycosis of the toenails.   Lab Results  Component Value Date   HGBA1C 8.4 (A) 09/07/2018   Lab Results  Component Value Date   CREATININE 2.08 (H) 02/21/2018   BUN 21 02/21/2018   NA 132 (L) 02/21/2018   K 4.3 02/21/2018   CL 98 02/21/2018   CO2 25 02/21/2018       Assessment & Plan:  HTN: is noted today Edema: This limits rx options Insulin-requiring type 2 DM: he needs increased rx Noncompliance with insulin: we'll change to qd insulin occupational status: he needs to be able to take insulin with the evening meal, due to working 3rd shift. Renal failure: in this setting, he needs a fast-acting insulin mixture, to take qd.    Patient Instructions  Your blood pressure is high today.  Please see your primary care provider soon, to have it rechecked Please change the insulin to "75/25," 25 units with supper. Please continue the same other diabetes medications.   Here is a prescription for "Wilder Glade," for when the insurance wants you to change.   check your blood sugar twice a day.  vary the  time of day when you check, between before the 3 meals, and at bedtime.  also check if you have symptoms of your blood sugar being too high or too low.  please keep a record of the readings and bring it to your next appointment here (or you can bring the meter itself).  You can write it on any piece of paper.  please call us sooner if your blood sugar goes below 70, or if you have a lot of readings over 200.

## 2018-10-02 ENCOUNTER — Telehealth: Payer: Self-pay | Admitting: Family

## 2018-10-02 MED ORDER — LISINOPRIL-HYDROCHLOROTHIAZIDE 10-12.5 MG PO TABS: 0.5000 | ORAL_TABLET | Freq: Every day | ORAL | 1 refills | Status: DC

## 2018-10-02 MED ORDER — GABAPENTIN 300 MG PO CAPS: 300.0000 mg | ORAL_CAPSULE | Freq: Three times a day (TID) | ORAL | 2 refills | Status: DC

## 2018-10-02 NOTE — Telephone Encounter (Signed)
When he was here in January, we asked him to make sure he saw his kidney doctor in follow-up. Please ask him if he has scheduled that appointment. I noticed his blood pressure was up at his most recent appointment with Dr. Loanne Drilling and I need to know what the kidney doctor is doing for him.

## 2018-10-02 NOTE — Telephone Encounter (Signed)
Patient returning call. Says he never saw [facility].

## 2018-10-02 NOTE — Telephone Encounter (Signed)
Called and left message for patient to return call to clinic.   He was told to see Dr.  Deberah Pelton  with [facility] back in January and not sure if he was seen or not.

## 2018-10-03 NOTE — Telephone Encounter (Signed)
Called and left message to return call to me and left my direct phone number so I could speak with him. Also created CRM incase he calls back.

## 2018-10-03 NOTE — Telephone Encounter (Signed)
Will call patient for clarification. He had called [place] back in Jan and stated that was who he had seen. Mickel Baas has note in chart as well were she called over to [facility] and they said he was suppose to follow back up with then in December of 2019 but never did. I will advise patient to again call [facility] and set up appointment to be seen.

## 2018-10-03 NOTE — Telephone Encounter (Signed)
Patient called back and I spoke with him today.

## 2018-10-04 ENCOUNTER — Encounter: Payer: Self-pay | Admitting: Family

## 2018-10-04 ENCOUNTER — Other Ambulatory Visit: Payer: Self-pay

## 2018-10-04 ENCOUNTER — Ambulatory Visit (INDEPENDENT_AMBULATORY_CARE_PROVIDER_SITE_OTHER): Payer: PRIVATE HEALTH INSURANCE | Admitting: Family

## 2018-10-04 VITALS — BP 150/88 | HR 86 | Temp 98.4°F | Ht 71.0 in | Wt 283.0 lb

## 2018-10-04 DIAGNOSIS — I10 Essential (primary) hypertension: Secondary | ICD-10-CM

## 2018-10-04 DIAGNOSIS — Z23 Encounter for immunization: Secondary | ICD-10-CM

## 2018-10-04 MED ORDER — AMLODIPINE BESYLATE 5 MG PO TABS: 5.0000 mg | ORAL_TABLET | Freq: Every day | ORAL | 3 refills | Status: DC

## 2018-10-04 NOTE — Progress Notes (Signed)
David Lindsey is a 63 y.o. male with the following history as recorded in EpicCare:  Patient Active Problem List   Diagnosis Date Noted  . 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 Degenerative arthritis of left knee 11/18/2017  . Left shoulder pain 08/29/2017  . History of total right knee replacement (TKR) 03/25/2017  . Arthralgia 02/24/2017  . Edema 01/13/2017  . Wellness examination 01/30/2015  . Myalgia and myositis 01/28/2014  . Unspecified constipation 03/13/2013  . Screening for prostate cancer 01/18/2013  . Diabetes mellitus with renal manifestation ([place]) 01/18/2013  . Encounter for long-term (current) use of other medications 03/24/2012  . HYPOKALEMIA 06/06/2008  . Pancytopenia ([place]) 06/06/2008  . Hypogonadism, male 12/22/2007  . HYPERCHOLESTEROLEMIA 12/22/2007  . ALCOHOLISM 12/22/2007  . ESOPHAGITIS, REFLUX 12/22/2007  . Leg pain 12/22/2007  . Essential hypertension 11/10/2006  . Disorder resulting from impaired renal function 11/10/2006    Current Outpatient Medications  Medication Sig Dispense Refill  . Apple Cider Vinegar 600 MG CAPS Take 600 mg by mouth daily.    . Ascorbic Acid (VITAMIN C) 1000 MG tablet Take 1,000 mg by mouth every evening.    Marland Kitchen aspirin EC 81 MG tablet Take 1 tablet (81 mg total) by mouth 2 (two) times daily. 60 tablet 0  . Biotin w/ Vitamins C & E (HAIR SKIN & NAILS GUMMIES PO) Take 1 tablet by mouth every evening.    . Carboxymethylcellul-Glycerin (LUBRICATING EYE DROPS OP) Place 1 drop into both eyes daily as needed (dry eyes).    . clotrimazole-betamethasone (LOTRISONE) cream APPLY CREAM TOPICALLY THREE TIMES DAILY AS NEEDED FOR RASH 45 g 0  . Coenzyme Q10 (COQ-10) 200 MG CAPS Take 200 mg by mouth every evening.     . Cyanocobalamin (B-12) 5000 MCG CAPS Take 5,000 mcg by mouth every evening.     . docusate sodium (COLACE) 100 MG capsule Take 200 mg by mouth every evening.    Marland Kitchen EASY TOUCH PEN NEEDLES 31G X 8 MM MISC USE ONCE DAILY WITH  VICTOZA 100 each 11  . gabapentin (NEURONTIN) 300 MG capsule Take 1 capsule (300 mg total) by mouth 3 (three) times daily. 270 capsule 2  . glucose blood (ONE TOUCH ULTRA TEST) test strip Use as instructed check blood sugar two times daily     . Insulin Lispro Prot & Lispro (HUMALOG MIX 75/25 KWIKPEN) (75-25) 100 UNIT/ML Kwikpen Inject 25 Units into the skin daily with breakfast. 15 mL 11  . INVOKANA 100 MG TABS tablet TAKE 1 TABLET BY MOUTH BEFORE BREAKFAST    . lisinopril-hydrochlorothiazide (ZESTORETIC) 10-12.5 MG tablet Take 0.5 tablets by mouth daily. 45 tablet 1  . Menthol, Topical Analgesic, (STOPAIN ROLL-ON) 8 % LIQD Apply 1 application topically 3 (three) times daily as needed (for knee pain.).    Marland Kitchen Methylcellulose, Laxative, (HM FIBER) 500 MG TABS Take 500 mg by mouth 2 (two) times daily. Fiberwell Gummies    . metoprolol succinate (TOPROL-XL) 25 MG 24 hr tablet Take 0.5 tablets (12.5 mg total) by mouth at bedtime. 45 tablet 3  . Multiple Vitamin (MULTIVITAMIN WITH MINERALS) TABS tablet Take 1 tablet by mouth every evening.     . Omega-3-6-9 CAPS Take 1 capsule by mouth every evening. 1600mg     . omeprazole (PRILOSEC) 20 MG capsule Take 1 capsule (20 mg total) by mouth daily. 90 capsule 3  . rosuvastatin (CRESTOR) 5 MG tablet Take 1 tablet (5 mg total) by mouth daily. (Patient taking differently: Take 5  mg by mouth at bedtime. ) 90 tablet 3  . sildenafil (VIAGRA) 100 MG tablet TAKE 1 TABLET BY MOUTH ONCE DAILY AS NEEDED 6 tablet 6  . traZODone (DESYREL) 100 MG tablet Take 1 tablet (100 mg total) by mouth at bedtime. 90 tablet 3  . trolamine salicylate (ASPERCREME) 10 % cream Apply 1 application topically as needed (knee pain).    Marland Kitchen VICTOZA 18 MG/3ML SOPN INJECT 1.8 MG  SUBCUTANEOUSLY ONCE DAILY 9 mL 0  . vitamin E 400 UNIT capsule Take 400 Units by mouth every evening.     Marland Kitchen amLODipine (NORVASC) 5 MG tablet Take 1 tablet (5 mg total) by mouth daily. 30 tablet 3  . dapagliflozin  propanediol (FARXIGA) 10 MG TABS tablet Take 10 mg by mouth daily. (Patient not taking: Reported on 10/04/2018) 30 tablet 11  . HUMALOG KWIKPEN 100 UNIT/ML KwikPen INJECT 10 UNITS TOTAL INTO THE SKIN 3 TIMES DAILY WITH MEALS.     No current facility-administered medications for this visit.     Allergies: Pioglitazone  Past Medical History:  Diagnosis Date  . ALCOHOLISM 12/22/2007  . ANEMIA, IRON DEFICIENCY 12/26/2007  . Arthritis    OA  . Blood transfusion without reported diagnosis    as a child  . DIABETES MELLITUS, TYPE II 11/10/2006  . ESOPHAGITIS, REFLUX 12/22/2007  . GERD (gastroesophageal reflux disease)   . HYPERCHOLESTEROLEMIA 12/22/2007  . HYPERTENSION 11/10/2006  . HYPOGONADISM 12/22/2007   Prob. due to ETOH  . HYPOKALEMIA 06/06/2008  . Leukopenia   . Neuropathy    Chronic Neuropathy BOTH FEET  . Pancytopenia 06/06/2008  . RENAL INSUFFICIENCY 11/10/2006   SEES [place] [facility] STAGE 2 CKD    Past Surgical History:  Procedure Laterality Date  . APPENDECTOMY  AGE 61 OR 10  . COLONOSCOPY  08-21-2004   [facility]- normal  . COLONSCOPY  2017  . ELECTROCARDIOGRAM  11/07/2006  . ESOPHAGOGASTRODUODENOSCOPY  08/20/2004  . EYE SURGERY Bilateral 2014   [facility] FOR CATARACTS  . REPLACEMENT TOTAL KNEE Right 2011  . TOTAL KNEE ARTHROPLASTY Left 02/20/2018   Procedure: LEFT TOTAL KNEE ARTHROPLASTY;  Surgeon: Frederik Pear, MD;  Location: [facility];  Service: Orthopedics;  Laterality: Left;  . UPPER GASTROINTESTINAL ENDOSCOPY      Family History  Problem Relation Age of Onset  . Cancer Mother        Colon Cancer  . Colon cancer Mother   . Colon polyps Brother   . Esophageal cancer Neg Hx   . Rectal cancer Neg Hx   . Stomach cancer Neg Hx     Social History   Tobacco Use  . Smoking status: Never Smoker  . Smokeless tobacco: Never Used  Substance Use Topics  . Alcohol use: No    Alcohol/week: 0.0 standard drinks    Comment: LAST USE 10 YRS AGO    Subjective:  Follow-up on hypertension;  at last OV in January, was asked to see his nephrologist in follow-up; patient has opted not to do this; per patient, he is supposed to be seeing his kidney doctor every 3-4 months but no records available/ last OV was recommended for 03/2018; saw his endocrinologist last month and blood pressure was noted to be elevated;  Recent creatinine level was still elevated at 1.7 but improved from previous readings; Denies any chest pain, shortness of breath, blurred vision or headache. Is taking his medication daily as prescribed;    Objective:  Vitals:   10/04/18 1045  BP: (!) 150/88  Pulse: 86  Temp: 98.4 F (36.9 C)  TempSrc: Oral  SpO2: 97%  Weight: 283 lb (128.4 kg)  Height: 5\' 11"  (1.803 m)    General: Well developed, well nourished, in no acute distress  Skin : Warm and dry.  Head: Normocephalic and atraumatic  Lungs: Respirations unlabored; clear to auscultation bilaterally without wheeze, rales, rhonchi  CVS exam: normal rate and regular rhythm.  Abdomen: Soft; nontender; nondistended; normoactive bowel sounds; no masses or hepatosplenomegaly  Neurologic: Alert and oriented; speech intact; face symmetrical; moves all extremities well; CNII-XII intact without focal deficit   Assessment:  1. Essential hypertension     Plan:  Uncontrolled; am hesitant to increase the Lisinopril HCT due to history of CKD- unfortunately, patient is a difficult historian and difficult to clarify how often he sees his kidney doctor and what type of follow-up has been recommended; recent creatinine was improved at 1.7; add Amlodipine 5 mg daily and continue Toprol XL daily; follow-up in 1 month- will plan to check labs at that time.  Shingrix #2 is given today as well.   Return in about 1 month (around 11/03/2018).  No orders of the defined types were placed in this encounter.   Requested Prescriptions   Signed Prescriptions Disp Refills  . amLODipine (NORVASC) 5 MG tablet 30 tablet 3    Sig: Take 1  tablet (5 mg total) by mouth daily.

## 2018-10-04 NOTE — Addendum Note (Signed)
Addended by: Marcina Millard on: 10/04/2018 11:29 AM   Modules accepted: Orders

## 2018-10-22 IMAGING — US US RENAL
1 series · 14 of 25 positions shown · non-contrast
Comparison: CT abdomen and pelvis January 15, 2010

CLINICAL DATA: Chronic renal disease

EXAM:
RENAL / URINARY TRACT ULTRASOUND COMPLETE

[Series 1: us renal · 0.27mm/px · 14 of 42 slices shown]
[im 1/42]
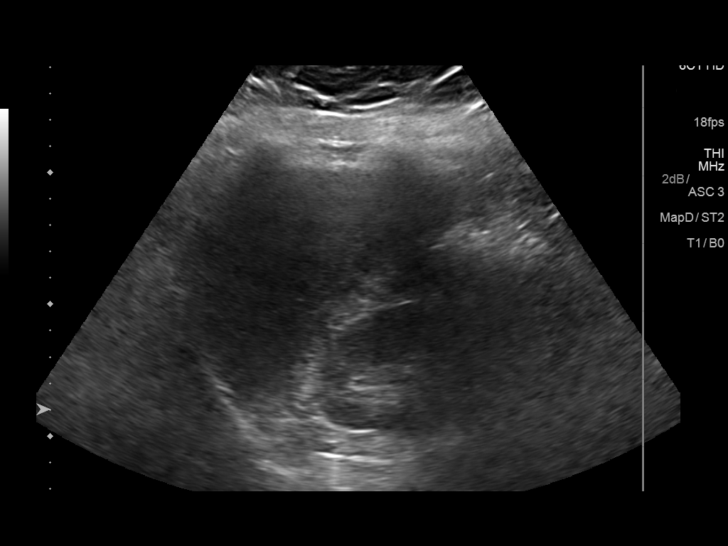
[im 4/42]
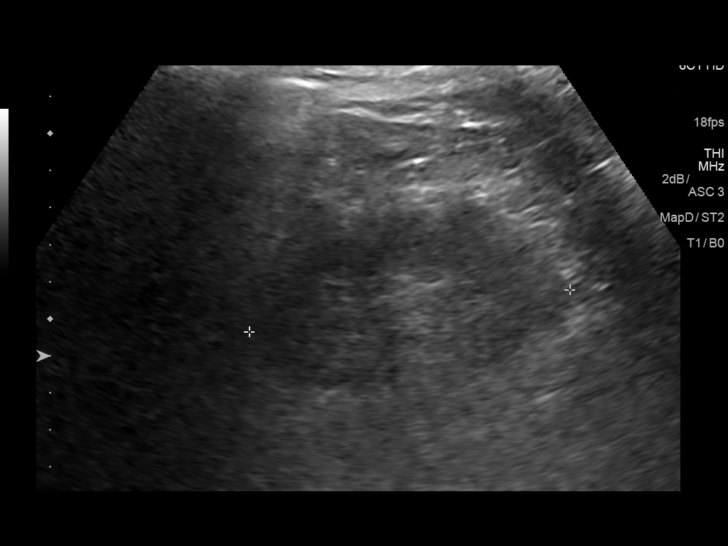
[im 7/42]
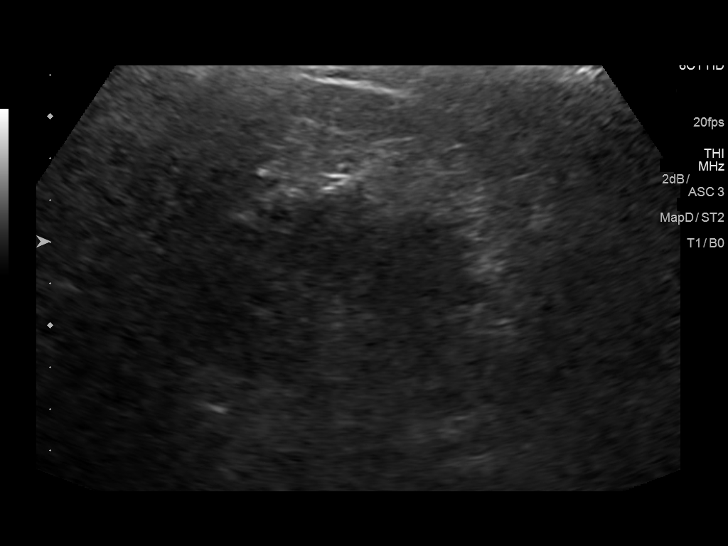
[im 11/42]
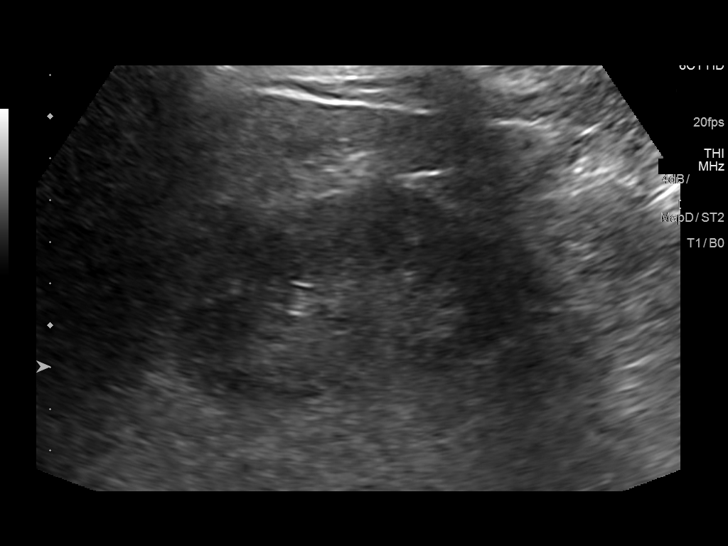
[im 14/42]
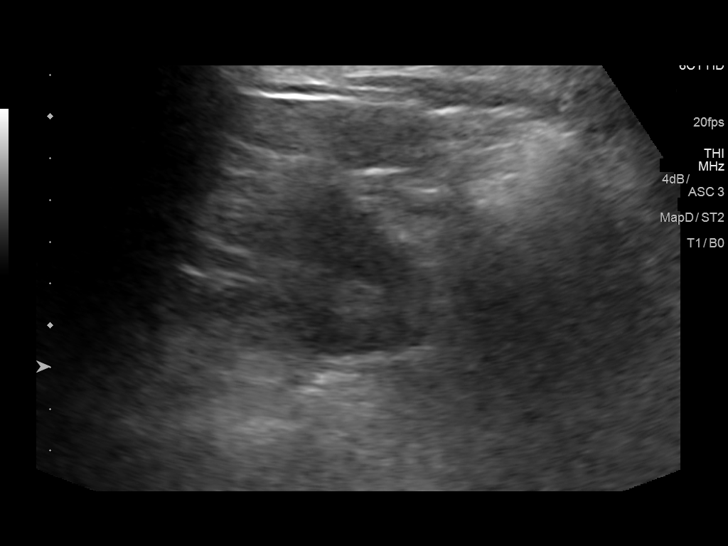
[im 16/42]
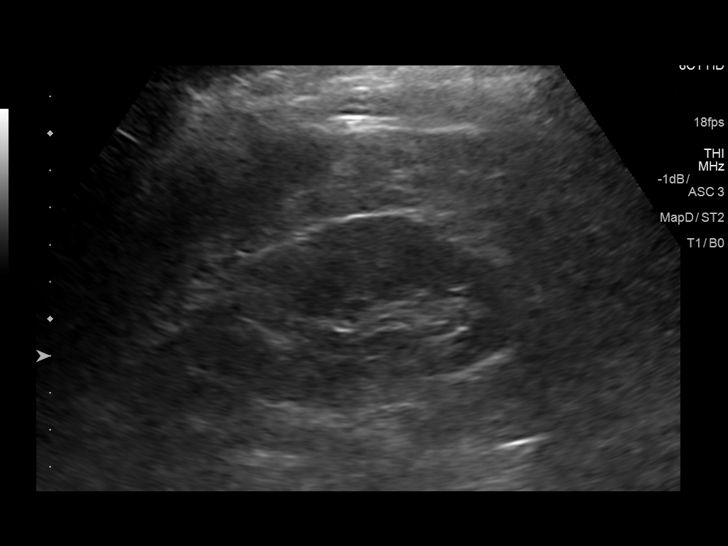
[im 19/42]
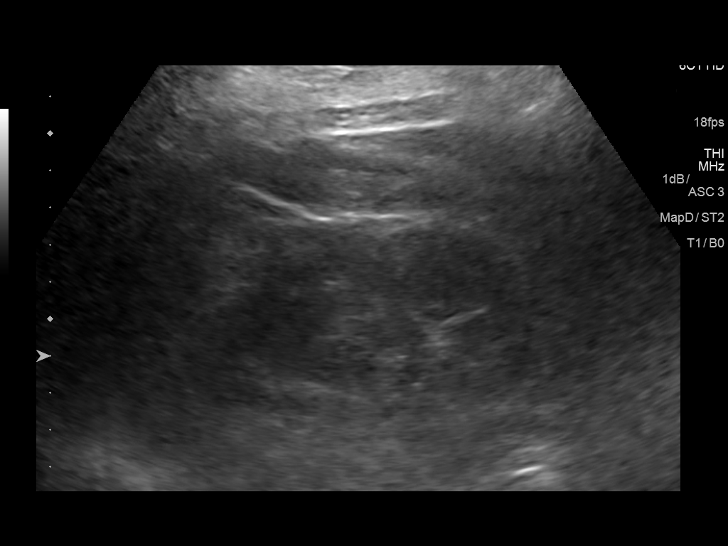
[im 23/42]
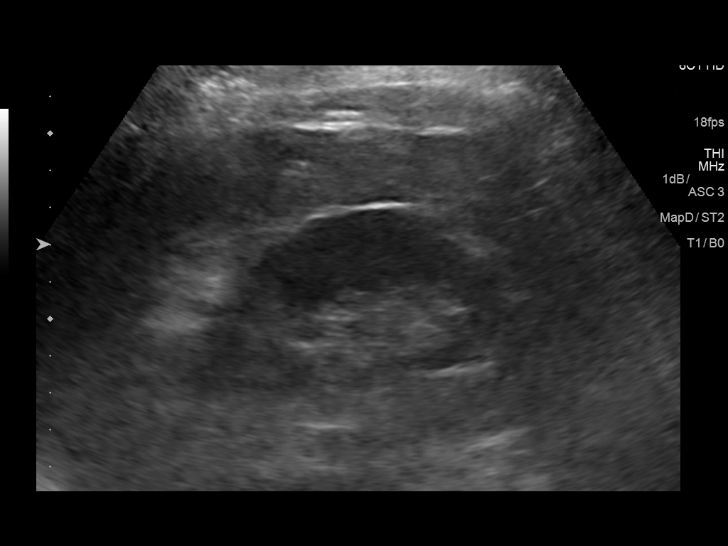
[im 26/42]
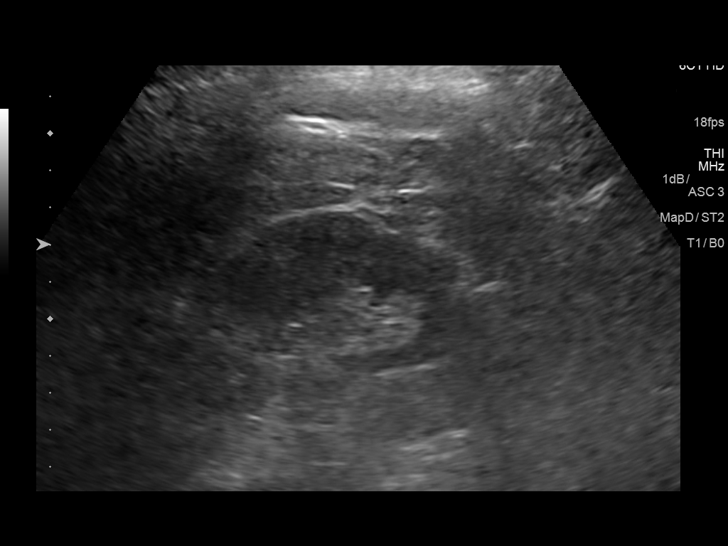
[im 28/42]
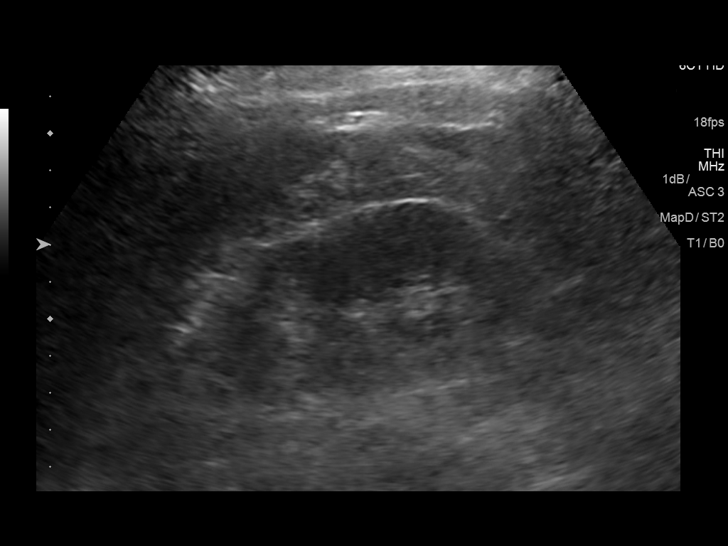
[im 31/42]
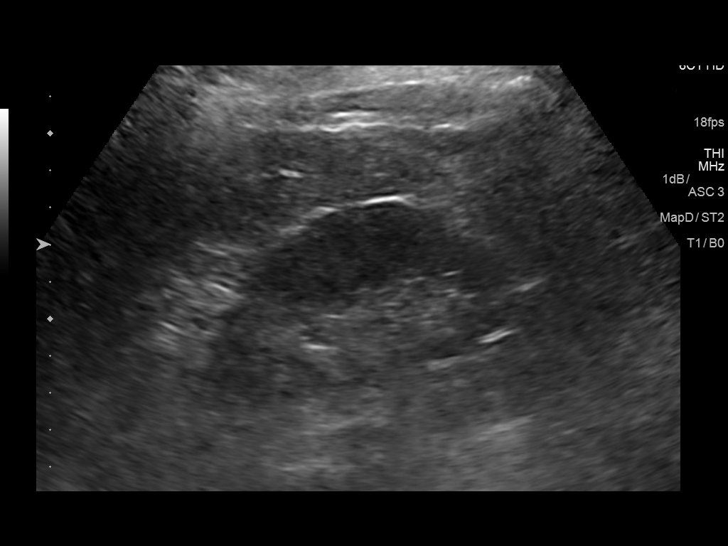
[im 35/42]
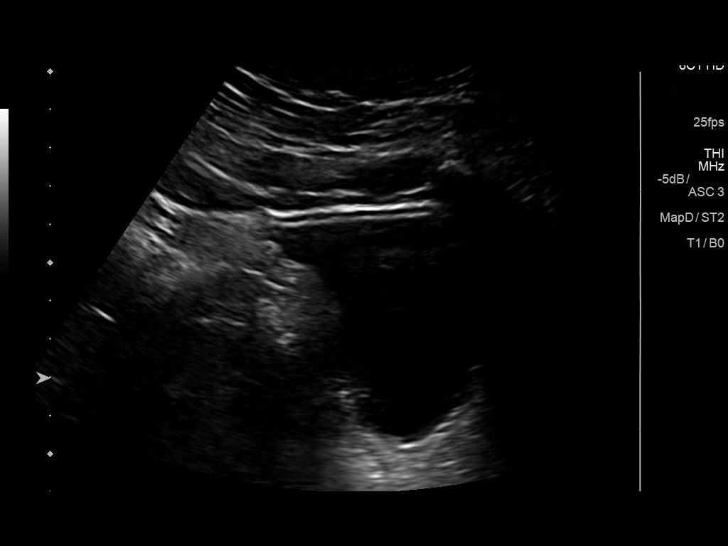
[im 38/42]
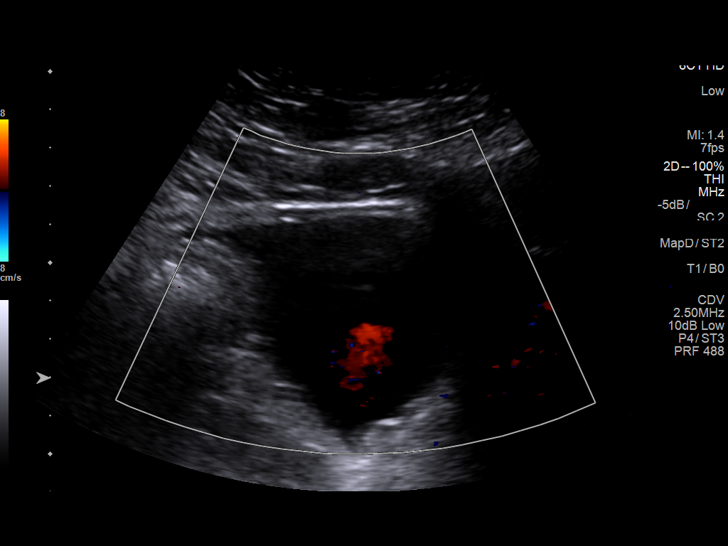
[im 42/42]
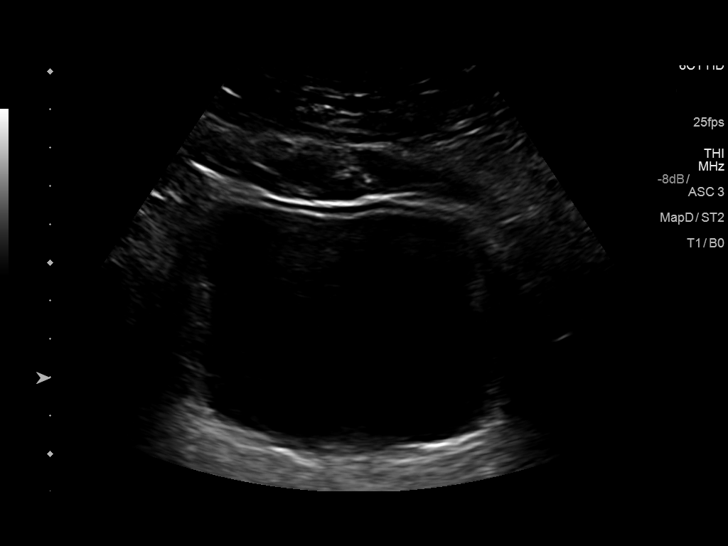

[14 of 25 positions shown; findings below may reference images not displayed]

FINDINGS: Right Kidney:

Length: 8.5 cm. Echogenicity and renal cortical thickness are within
normal limits. No mass, perinephric fluid, or hydronephrosis
visualized. No sonographically demonstrable calculus or
ureterectasis.

Left Kidney:

Length: 9.6 cm. Echogenicity and renal cortical thickness are within
normal limits. No mass, perinephric fluid, or hydronephrosis
visualized. No sonographically demonstrable calculus or
ureterectasis.

Bladder:

Appears normal for degree of bladder distention.
IMPRESSION: Kidneys are rather small, particularly on the right, a finding that
may be indicative of a degree of medical renal disease. No
obstructing focus in either kidney. Renal cortical thickness and
echogenicity are normal bilaterally. Study otherwise unremarkable.

## 2018-10-23 ENCOUNTER — Telehealth: Payer: Self-pay | Admitting: Family

## 2018-10-23 NOTE — Telephone Encounter (Signed)
Patient is calling to get order for him to come back and recheck his labs from last month. Please call patient when ready, thanks.

## 2018-10-23 NOTE — Telephone Encounter (Signed)
Called and spoke with patient. He was due for follow up appointment around 7/24 regarding BP so appointment made.

## 2018-11-07 ENCOUNTER — Encounter: Payer: Self-pay | Admitting: Family

## 2018-11-07 ENCOUNTER — Other Ambulatory Visit: Payer: Self-pay

## 2018-11-07 ENCOUNTER — Ambulatory Visit (INDEPENDENT_AMBULATORY_CARE_PROVIDER_SITE_OTHER): Payer: PRIVATE HEALTH INSURANCE | Admitting: Family

## 2018-11-07 VITALS — BP 144/84 | HR 85 | Temp 98.6°F | Ht 71.0 in | Wt 280.6 lb

## 2018-11-07 DIAGNOSIS — I10 Essential (primary) hypertension: Secondary | ICD-10-CM

## 2018-11-07 NOTE — Patient Instructions (Signed)
Increase your Amlodipine to 10 mg daily.

## 2018-11-07 NOTE — Progress Notes (Signed)
David Lindsey is a 63 y.o. male with the following history as recorded in EpicCare:  Patient Active Problem List   Diagnosis Date Noted  . 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 Degenerative arthritis of left knee 11/18/2017  . Left shoulder pain 08/29/2017  . History of total right knee replacement (TKR) 03/25/2017  . Arthralgia 02/24/2017  . Edema 01/13/2017  . Wellness examination 01/30/2015  . Myalgia and myositis 01/28/2014  . Unspecified constipation 03/13/2013  . Screening for prostate cancer 01/18/2013  . Diabetes mellitus with renal manifestation ([place]) 01/18/2013  . Encounter for long-term (current) use of other medications 03/24/2012  . HYPOKALEMIA 06/06/2008  . Pancytopenia ([place]) 06/06/2008  . Hypogonadism, male 12/22/2007  . HYPERCHOLESTEROLEMIA 12/22/2007  . ALCOHOLISM 12/22/2007  . ESOPHAGITIS, REFLUX 12/22/2007  . Leg pain 12/22/2007  . Essential hypertension 11/10/2006  . Disorder resulting from impaired renal function 11/10/2006    Current Outpatient Medications  Medication Sig Dispense Refill  . amLODipine (NORVASC) 5 MG tablet Take 1 tablet (5 mg total) by mouth daily. 30 tablet 3  . Apple Cider Vinegar 600 MG CAPS Take 600 mg by mouth daily.    . Ascorbic Acid (VITAMIN C) 1000 MG tablet Take 1,000 mg by mouth every evening.    Marland Kitchen aspirin EC 81 MG tablet Take 1 tablet (81 mg total) by mouth 2 (two) times daily. 60 tablet 0  . Biotin w/ Vitamins C & E (HAIR SKIN & NAILS GUMMIES PO) Take 1 tablet by mouth every evening.    . Carboxymethylcellul-Glycerin (LUBRICATING EYE DROPS OP) Place 1 drop into both eyes daily as needed (dry eyes).    . clotrimazole-betamethasone (LOTRISONE) cream APPLY CREAM TOPICALLY THREE TIMES DAILY AS NEEDED FOR RASH 45 g 0  . Coenzyme Q10 (COQ-10) 200 MG CAPS Take 200 mg by mouth every evening.     . Cyanocobalamin (B-12) 5000 MCG CAPS Take 5,000 mcg by mouth every evening.     . docusate sodium (COLACE) 100 MG capsule Take 200  mg by mouth every evening.    Marland Kitchen EASY TOUCH PEN NEEDLES 31G X 8 MM MISC USE ONCE DAILY WITH VICTOZA 100 each 11  . gabapentin (NEURONTIN) 300 MG capsule Take 1 capsule (300 mg total) by mouth 3 (three) times daily. 270 capsule 2  . glucose blood (ONE TOUCH ULTRA TEST) test strip Use as instructed check blood sugar two times daily     . HUMALOG KWIKPEN 100 UNIT/ML KwikPen INJECT 10 UNITS TOTAL INTO THE SKIN 3 TIMES DAILY WITH MEALS.    Marland Kitchen Insulin Lispro Prot & Lispro (HUMALOG MIX 75/25 KWIKPEN) (75-25) 100 UNIT/ML Kwikpen Inject 25 Units into the skin daily with breakfast. 15 mL 11  . INVOKANA 100 MG TABS tablet TAKE 1 TABLET BY MOUTH BEFORE BREAKFAST    . lisinopril-hydrochlorothiazide (ZESTORETIC) 10-12.5 MG tablet Take 0.5 tablets by mouth daily. 45 tablet 1  . Menthol, Topical Analgesic, (STOPAIN ROLL-ON) 8 % LIQD Apply 1 application topically 3 (three) times daily as needed (for knee pain.).    Marland Kitchen Methylcellulose, Laxative, (HM FIBER) 500 MG TABS Take 500 mg by mouth 2 (two) times daily. Fiberwell Gummies    . metoprolol succinate (TOPROL-XL) 25 MG 24 hr tablet Take 0.5 tablets (12.5 mg total) by mouth at bedtime. 45 tablet 3  . Multiple Vitamin (MULTIVITAMIN WITH MINERALS) TABS tablet Take 1 tablet by mouth every evening.     . Omega-3-6-9 CAPS Take 1 capsule by mouth every evening. 1600mg     .  omeprazole (PRILOSEC) 20 MG capsule Take 1 capsule (20 mg total) by mouth daily. 90 capsule 3  . rosuvastatin (CRESTOR) 5 MG tablet Take 1 tablet (5 mg total) by mouth daily. (Patient taking differently: Take 5 mg by mouth at bedtime. ) 90 tablet 3  . sildenafil (VIAGRA) 100 MG tablet TAKE 1 TABLET BY MOUTH ONCE DAILY AS NEEDED 6 tablet 6  . traZODone (DESYREL) 100 MG tablet Take 1 tablet (100 mg total) by mouth at bedtime. 90 tablet 3  . trolamine salicylate (ASPERCREME) 10 % cream Apply 1 application topically as needed (knee pain).    Marland Kitchen VICTOZA 18 MG/3ML SOPN INJECT 1.8 MG  SUBCUTANEOUSLY ONCE DAILY 9  mL 0  . vitamin E 400 UNIT capsule Take 400 Units by mouth every evening.     . dapagliflozin propanediol (FARXIGA) 10 MG TABS tablet Take 10 mg by mouth daily. (Patient not taking: Reported on 10/04/2018) 30 tablet 11   No current facility-administered medications for this visit.     Allergies: Pioglitazone  Past Medical History:  Diagnosis Date  . ALCOHOLISM 12/22/2007  . ANEMIA, IRON DEFICIENCY 12/26/2007  . Arthritis    OA  . Blood transfusion without reported diagnosis    as a child  . DIABETES MELLITUS, TYPE II 11/10/2006  . ESOPHAGITIS, REFLUX 12/22/2007  . GERD (gastroesophageal reflux disease)   . HYPERCHOLESTEROLEMIA 12/22/2007  . HYPERTENSION 11/10/2006  . HYPOGONADISM 12/22/2007   Prob. due to ETOH  . HYPOKALEMIA 06/06/2008  . Leukopenia   . Neuropathy    Chronic Neuropathy BOTH FEET  . Pancytopenia 06/06/2008  . RENAL INSUFFICIENCY 11/10/2006   SEES [place] [facility] STAGE 2 CKD    Past Surgical History:  Procedure Laterality Date  . APPENDECTOMY  AGE 69 OR 10  . COLONOSCOPY  08-21-2004   [facility]- normal  . COLONSCOPY  2017  . ELECTROCARDIOGRAM  11/07/2006  . ESOPHAGOGASTRODUODENOSCOPY  08/20/2004  . EYE SURGERY Bilateral 2014   [facility] FOR CATARACTS  . REPLACEMENT TOTAL KNEE Right 2011  . TOTAL KNEE ARTHROPLASTY Left 02/20/2018   Procedure: LEFT TOTAL KNEE ARTHROPLASTY;  Surgeon: Frederik Pear, MD;  Location: [facility];  Service: Orthopedics;  Laterality: Left;  . UPPER GASTROINTESTINAL ENDOSCOPY      Family History  Problem Relation Age of Onset  . Cancer Mother        Colon Cancer  . Colon cancer Mother   . Colon polyps Brother   . Esophageal cancer Neg Hx   . Rectal cancer Neg Hx   . Stomach cancer Neg Hx     Social History   Tobacco Use  . Smoking status: Never Smoker  . Smokeless tobacco: Never Used  Substance Use Topics  . Alcohol use: No    Alcohol/week: 0.0 standard drinks    Comment: LAST USE 10 YRS AGO    Subjective:  1 month follow-up on hypertension;  at last OV, Amlodipine was added at 5 mg; is currently taking Lisinopril HCT 10/12.5 mg- 1/2 tablet daily and Toprol XL 1/2 tablet daily; notes has been feeling much better since adding new blood pressure medication; Denies any chest pain, shortness of breath, blurred vision or headache.    Objective:  Vitals:   11/07/18 1043  BP: (!) 144/84  Pulse: 85  Temp: 98.6 F (37 C)  TempSrc: Oral  SpO2: 97%  Weight: 280 lb 9.6 oz (127.3 kg)  Height: 5\' 11"  (1.803 m)    General: Well developed, well nourished, in no acute distress  Skin : Warm and dry.  Head: Normocephalic and atraumatic  Lungs: Respirations unlabored; clear to auscultation bilaterally without wheeze, rales, rhonchi  CVS exam: normal rate and regular rhythm.  Neurologic: Alert and oriented; speech intact; face symmetrical;   Assessment:  1. Essential hypertension     Plan:  Improving; increase Amlodipine to 10 mg daily; continue other medications as prescribed for blood pressure; re-check in 2 weeks- will update labs at next OV.  Return in about 2 weeks (around 11/21/2018).  No orders of the defined types were placed in this encounter.   Requested Prescriptions    No prescriptions requested or ordered in this encounter

## 2018-11-21 ENCOUNTER — Other Ambulatory Visit: Payer: Self-pay

## 2018-11-21 ENCOUNTER — Encounter: Payer: Self-pay | Admitting: Family

## 2018-11-21 ENCOUNTER — Ambulatory Visit (INDEPENDENT_AMBULATORY_CARE_PROVIDER_SITE_OTHER): Payer: PRIVATE HEALTH INSURANCE | Admitting: Family

## 2018-11-21 ENCOUNTER — Other Ambulatory Visit (INDEPENDENT_AMBULATORY_CARE_PROVIDER_SITE_OTHER): Payer: PRIVATE HEALTH INSURANCE

## 2018-11-21 VITALS — BP 138/80 | HR 79 | Temp 97.7°F | Ht 71.0 in | Wt 282.1 lb

## 2018-11-21 DIAGNOSIS — I10 Essential (primary) hypertension: Secondary | ICD-10-CM

## 2018-11-21 LAB — COMPREHENSIVE METABOLIC PANEL
ALT: 20 U/L (ref 0–53)
AST: 14 U/L (ref 0–37)
Albumin: 4.2 g/dL (ref 3.5–5.2)
Alkaline Phosphatase: 66 U/L (ref 39–117)
BUN: 15 mg/dL (ref 6–23)
CO2: 27 mEq/L (ref 19–32)
Calcium: 9.4 mg/dL (ref 8.4–10.5)
Chloride: 100 mEq/L (ref 96–112)
Creatinine, Ser: 1.82 mg/dL — ABNORMAL HIGH (ref 0.40–1.50)
GFR: 45.49 mL/min — ABNORMAL LOW (ref >60.00)
Glucose, Bld: 249 mg/dL — ABNORMAL HIGH (ref 70–99)
Potassium: 4.2 mEq/L (ref 3.5–5.1)
Sodium: 133 mEq/L — ABNORMAL LOW (ref 135–145)
Total Bilirubin: 0.4 mg/dL (ref 0.2–1.2)
Total Protein: 7.3 g/dL (ref 6.0–8.3)

## 2018-11-21 MED ORDER — AMLODIPINE BESYLATE 10 MG PO TABS: 10.0000 mg | ORAL_TABLET | Freq: Every day | ORAL | 1 refills | Status: DC

## 2018-11-21 NOTE — Progress Notes (Signed)
David Lindsey is a 63 y.o. male with the following history as recorded in EpicCare:  Patient Active Problem List   Diagnosis Date Noted  . 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 Degenerative arthritis of left knee 11/18/2017  . Left shoulder pain 08/29/2017  . History of total right knee replacement (TKR) 03/25/2017  . Arthralgia 02/24/2017  . Edema 01/13/2017  . Wellness examination 01/30/2015  . Myalgia and myositis 01/28/2014  . Unspecified constipation 03/13/2013  . Screening for prostate cancer 01/18/2013  . Diabetes mellitus with renal manifestation ([place]) 01/18/2013  . Encounter for long-term (current) use of other medications 03/24/2012  . HYPOKALEMIA 06/06/2008  . Pancytopenia ([place]) 06/06/2008  . Hypogonadism, male 12/22/2007  . HYPERCHOLESTEROLEMIA 12/22/2007  . ALCOHOLISM 12/22/2007  . ESOPHAGITIS, REFLUX 12/22/2007  . Leg pain 12/22/2007  . Essential hypertension 11/10/2006  . Disorder resulting from impaired renal function 11/10/2006    Current Outpatient Medications  Medication Sig Dispense Refill  . amLODipine (NORVASC) 10 MG tablet Take 1 tablet (10 mg total) by mouth daily. 90 tablet 1  . Apple Cider Vinegar 600 MG CAPS Take 600 mg by mouth daily.    . Ascorbic Acid (VITAMIN C) 1000 MG tablet Take 1,000 mg by mouth every evening.    Marland Kitchen aspirin EC 81 MG tablet Take 1 tablet (81 mg total) by mouth 2 (two) times daily. 60 tablet 0  . Biotin w/ Vitamins C & E (HAIR SKIN & NAILS GUMMIES PO) Take 1 tablet by mouth every evening.    . Carboxymethylcellul-Glycerin (LUBRICATING EYE DROPS OP) Place 1 drop into both eyes daily as needed (dry eyes).    . clotrimazole-betamethasone (LOTRISONE) cream APPLY CREAM TOPICALLY THREE TIMES DAILY AS NEEDED FOR RASH 45 g 0  . Coenzyme Q10 (COQ-10) 200 MG CAPS Take 200 mg by mouth every evening.     . Cyanocobalamin (B-12) 5000 MCG CAPS Take 5,000 mcg by mouth every evening.     . docusate sodium (COLACE) 100 MG capsule Take  200 mg by mouth every evening.    Marland Kitchen EASY TOUCH PEN NEEDLES 31G X 8 MM MISC USE ONCE DAILY WITH VICTOZA 100 each 11  . gabapentin (NEURONTIN) 300 MG capsule Take 1 capsule (300 mg total) by mouth 3 (three) times daily. 270 capsule 2  . glucose blood (ONE TOUCH ULTRA TEST) test strip Use as instructed check blood sugar two times daily     . HUMALOG KWIKPEN 100 UNIT/ML KwikPen INJECT 10 UNITS TOTAL INTO THE SKIN 3 TIMES DAILY WITH MEALS.    Marland Kitchen Insulin Lispro Prot & Lispro (HUMALOG MIX 75/25 KWIKPEN) (75-25) 100 UNIT/ML Kwikpen Inject 25 Units into the skin daily with breakfast. 15 mL 11  . INVOKANA 100 MG TABS tablet TAKE 1 TABLET BY MOUTH BEFORE BREAKFAST    . lisinopril-hydrochlorothiazide (ZESTORETIC) 10-12.5 MG tablet Take 0.5 tablets by mouth daily. 45 tablet 1  . Menthol, Topical Analgesic, (STOPAIN ROLL-ON) 8 % LIQD Apply 1 application topically 3 (three) times daily as needed (for knee pain.).    Marland Kitchen Methylcellulose, Laxative, (HM FIBER) 500 MG TABS Take 500 mg by mouth 2 (two) times daily. Fiberwell Gummies    . metoprolol succinate (TOPROL-XL) 25 MG 24 hr tablet Take 0.5 tablets (12.5 mg total) by mouth at bedtime. 45 tablet 3  . Multiple Vitamin (MULTIVITAMIN WITH MINERALS) TABS tablet Take 1 tablet by mouth every evening.     . Omega-3-6-9 CAPS Take 1 capsule by mouth every evening. '1600mg'$     .  omeprazole (PRILOSEC) 20 MG capsule Take 1 capsule (20 mg total) by mouth daily. 90 capsule 3  . rosuvastatin (CRESTOR) 5 MG tablet Take 1 tablet (5 mg total) by mouth daily. (Patient taking differently: Take 5 mg by mouth at bedtime. ) 90 tablet 3  . sildenafil (VIAGRA) 100 MG tablet TAKE 1 TABLET BY MOUTH ONCE DAILY AS NEEDED 6 tablet 6  . traZODone (DESYREL) 100 MG tablet Take 1 tablet (100 mg total) by mouth at bedtime. 90 tablet 3  . trolamine salicylate (ASPERCREME) 10 % cream Apply 1 application topically as needed (knee pain).    Marland Kitchen VICTOZA 18 MG/3ML SOPN INJECT 1.8 MG  SUBCUTANEOUSLY ONCE DAILY  9 mL 0  . vitamin E 400 UNIT capsule Take 400 Units by mouth every evening.     . dapagliflozin propanediol (FARXIGA) 10 MG TABS tablet Take 10 mg by mouth daily. (Patient not taking: Reported on 10/04/2018) 30 tablet 11   No current facility-administered medications for this visit.     Allergies: Pioglitazone  Past Medical History:  Diagnosis Date  . ALCOHOLISM 12/22/2007  . ANEMIA, IRON DEFICIENCY 12/26/2007  . Arthritis    OA  . Blood transfusion without reported diagnosis    as a child  . DIABETES MELLITUS, TYPE II 11/10/2006  . ESOPHAGITIS, REFLUX 12/22/2007  . GERD (gastroesophageal reflux disease)   . HYPERCHOLESTEROLEMIA 12/22/2007  . HYPERTENSION 11/10/2006  . HYPOGONADISM 12/22/2007   Prob. due to ETOH  . HYPOKALEMIA 06/06/2008  . Leukopenia   . Neuropathy    Chronic Neuropathy BOTH FEET  . Pancytopenia 06/06/2008  . RENAL INSUFFICIENCY 11/10/2006   SEES [place] KIDNEY STAGE 2 CKD    Past Surgical History:  Procedure Laterality Date  . APPENDECTOMY  AGE 27 OR 10  . COLONOSCOPY  08-21-2004   [facility]- normal  . COLONSCOPY  2017  . ELECTROCARDIOGRAM  11/07/2006  . ESOPHAGOGASTRODUODENOSCOPY  08/20/2004  . EYE SURGERY Bilateral 2014   [facility] FOR CATARACTS  . REPLACEMENT TOTAL KNEE Right 2011  . TOTAL KNEE ARTHROPLASTY Left 02/20/2018   Procedure: LEFT TOTAL KNEE ARTHROPLASTY;  Surgeon: Frederik Pear, MD;  Location: [facility];  Service: Orthopedics;  Laterality: Left;  . UPPER GASTROINTESTINAL ENDOSCOPY      Family History  Problem Relation Age of Onset  . Cancer Mother        Colon Cancer  . Colon cancer Mother   . Colon polyps Brother   . Esophageal cancer Neg Hx   . Rectal cancer Neg Hx   . Stomach cancer Neg Hx     Social History   Tobacco Use  . Smoking status: Never Smoker  . Smokeless tobacco: Never Used  Substance Use Topics  . Alcohol use: No    Alcohol/week: 0.0 standard drinks    Comment: LAST USE 10 YRS AGO    Subjective:  Follow-up on hypertension; at  last OV, Amlodipine was increased to 10 mg; Denies any chest pain, shortness of breath, blurred vision or headache; has continued Lisinopril HCT and Metoprolol;  No other concerns today;     Objective:  Vitals:   11/21/18 1119  BP: 138/80  Pulse: 79  Temp: 97.7 F (36.5 C)  TempSrc: Oral  SpO2: 97%  Weight: 282 lb 1.3 oz (128 kg)  Height: '5\' 11"'$  (1.803 m)    General: Well developed, well nourished, in no acute distress  Skin : Warm and dry.  Head: Normocephalic and atraumatic  Lungs: Respirations unlabored; clear to auscultation bilaterally without  wheeze, rales, rhonchi  CVS exam: normal rate and regular rhythm.  Neurologic: Alert and oriented; speech intact; face symmetrical; moves all extremities well; CNII-XII intact without focal deficit   Assessment:  1. Essential hypertension     Plan:  Improved; continue medications; check CMP; follow-up in 3 months for CPE;   Return in about 3 months (around 02/21/2019) for CPE.  Orders Placed This Encounter  Procedures  . Comp Met (CMET)    Standing Status:   Future    Number of Occurrences:   1    Standing Expiration Date:   11/21/2019    Requested Prescriptions   Signed Prescriptions Disp Refills  . amLODipine (NORVASC) 10 MG tablet 90 tablet 1    Sig: Take 1 tablet (10 mg total) by mouth daily.

## 2018-12-06 ENCOUNTER — Ambulatory Visit (INDEPENDENT_AMBULATORY_CARE_PROVIDER_SITE_OTHER): Payer: PRIVATE HEALTH INSURANCE

## 2018-12-06 DIAGNOSIS — Z23 Encounter for immunization: Secondary | ICD-10-CM

## 2018-12-14 ENCOUNTER — Other Ambulatory Visit: Payer: Self-pay | Admitting: Family

## 2018-12-14 ENCOUNTER — Other Ambulatory Visit: Payer: Self-pay | Admitting: Endocrinology

## 2018-12-14 DIAGNOSIS — E291 Testicular hypofunction: Secondary | ICD-10-CM

## 2019-01-05 ENCOUNTER — Other Ambulatory Visit: Payer: Self-pay

## 2019-01-08 ENCOUNTER — Ambulatory Visit (INDEPENDENT_AMBULATORY_CARE_PROVIDER_SITE_OTHER): Payer: PRIVATE HEALTH INSURANCE | Admitting: Endocrinology

## 2019-01-08 ENCOUNTER — Encounter: Payer: Self-pay | Admitting: Endocrinology

## 2019-01-08 ENCOUNTER — Other Ambulatory Visit: Payer: Self-pay

## 2019-01-08 VITALS — BP 124/70 | HR 89 | Ht 71.0 in | Wt 281.8 lb

## 2019-01-08 DIAGNOSIS — E1122 Type 2 diabetes mellitus with diabetic chronic kidney disease: Secondary | ICD-10-CM | POA: Diagnosis not present

## 2019-01-08 DIAGNOSIS — N181 Chronic kidney disease, stage 1: Secondary | ICD-10-CM | POA: Diagnosis not present

## 2019-01-08 LAB — POCT GLYCOSYLATED HEMOGLOBIN (HGB A1C): Hemoglobin A1C: 8 % — AB (ref 4.0–5.6)

## 2019-01-08 MED ORDER — INSULIN LISPRO PROT & LISPRO (75-25 MIX) 100 UNIT/ML KWIKPEN: 28.0000 [IU] | PEN_INJECTOR | Freq: Every day | SUBCUTANEOUS | 11 refills | Status: DC

## 2019-01-08 NOTE — Progress Notes (Signed)
Subjective:    Patient ID: David Lindsey, male    DOB: 1955/05/31, 63 y.o.   MRN: [id number]  HPI Pt returns for f/u of diabetes mellitus: DM type: Insulin-requiring type 2.   Dx'ed: 0000000 Complications: renal insufficiency and PN Therapy: insulin since 2019, victoza, and 2 oral meds.   DKA: never.   Severe hypoglycemia: never.   Pancreatitis: never.   Other: he did not tolerate parlodel (tremor); he cannot take pioglitizone (edema); he works 3rd shift; he takes qd insulin, after poor results with multiple daily injections.   Interval history: no cbg record, but states cbg's vary from 100-200.  pt states he feels well in general. Past Medical History:  Diagnosis Date  . ALCOHOLISM 12/22/2007  . ANEMIA, IRON DEFICIENCY 12/26/2007  . Arthritis    OA  . Blood transfusion without reported diagnosis    as a child  . DIABETES MELLITUS, TYPE II 11/10/2006  . ESOPHAGITIS, REFLUX 12/22/2007  . GERD (gastroesophageal reflux disease)   . HYPERCHOLESTEROLEMIA 12/22/2007  . HYPERTENSION 11/10/2006  . HYPOGONADISM 12/22/2007   Prob. due to ETOH  . HYPOKALEMIA 06/06/2008  . Leukopenia   . Neuropathy    Chronic Neuropathy BOTH FEET  . Pancytopenia 06/06/2008  . RENAL INSUFFICIENCY 11/10/2006   SEES [place] [facility] STAGE 2 CKD    Past Surgical History:  Procedure Laterality Date  . APPENDECTOMY  AGE 5 OR 10  . COLONOSCOPY  08-21-2004   [facility]- normal  . COLONSCOPY  2017  . ELECTROCARDIOGRAM  11/07/2006  . ESOPHAGOGASTRODUODENOSCOPY  08/20/2004  . EYE SURGERY Bilateral 2014   [facility] FOR CATARACTS  . REPLACEMENT TOTAL KNEE Right 2011  . TOTAL KNEE ARTHROPLASTY Left 02/20/2018   Procedure: LEFT TOTAL KNEE ARTHROPLASTY;  Surgeon: Frederik Pear, MD;  Location: [facility];  Service: Orthopedics;  Laterality: Left;  . UPPER GASTROINTESTINAL ENDOSCOPY      Social History   Socioeconomic History  . Marital status: Married    Spouse name: Not on file  . Number of children: Not on file  . Years of  education: Not on file  . Highest education level: Not on file  Occupational History  . 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 Financial resource strain: Not on file  . Food insecurity    Worry: Not on file    Inability: Not on file  . Transportation needs    Medical: Not on file    Non-medical: Not on file  Tobacco Use  . Smoking status: Never Smoker  . Smokeless tobacco: Never Used  Substance and Sexual Activity  . Alcohol use: No    Alcohol/week: 0.0 standard drinks    Comment: LAST USE 10 YRS AGO  . Drug use: No  . Sexual activity: Not on file  Lifestyle  . Physical activity    Days per week: Not on file    Minutes per session: Not on file  . Stress: Not on file  Relationships  . Social Herbalist on phone: Not on file    Gets together: Not on file    Attends religious service: Not on file    Active member of club or organization: Not on file    Attends meetings of clubs or organizations: Not on file    Relationship status: Not on file  . Intimate partner violence    Fear of current or ex partner: Not on file    Emotionally abused: Not on file  Physically abused: Not on file    Forced sexual activity: Not on file  Other Topics Concern  . Not on file  Social History Narrative  . Not on file    Current Outpatient Medications on File Prior to Visit  Medication Sig Dispense Refill  . amLODipine (NORVASC) 10 MG tablet Take 1 tablet (10 mg total) by mouth daily. 90 tablet 1  . Apple Cider Vinegar 600 MG CAPS Take 600 mg by mouth daily.    . Ascorbic Acid (VITAMIN C) 1000 MG tablet Take 1,000 mg by mouth every evening.    Marland Kitchen aspirin EC 81 MG tablet Take 1 tablet (81 mg total) by mouth 2 (two) times daily. 60 tablet 0  . Biotin w/ Vitamins C & E (HAIR SKIN & NAILS GUMMIES PO) Take 1 tablet by mouth every evening.    . Carboxymethylcellul-Glycerin (LUBRICATING EYE DROPS OP) Place 1 drop into both eyes daily as needed (dry eyes).     . clotrimazole-betamethasone (LOTRISONE) cream APPLY CREAM TOPICALLY THREE TIMES DAILY AS NEEDED FOR RASH 45 g 0  . Coenzyme Q10 (COQ-10) 200 MG CAPS Take 200 mg by mouth every evening.     . Cyanocobalamin (B-12) 5000 MCG CAPS Take 5,000 mcg by mouth every evening.     . docusate sodium (COLACE) 100 MG capsule Take 200 mg by mouth every evening.    Marland Kitchen EASY TOUCH PEN NEEDLES 31G X 8 MM MISC USE ONCE DAILY WITH VICTOZA 100 each 11  . gabapentin (NEURONTIN) 300 MG capsule Take 1 capsule (300 mg total) by mouth 3 (three) times daily. 270 capsule 2  . glucose blood (ONE TOUCH ULTRA TEST) test strip Use as instructed check blood sugar two times daily     . INVOKANA 100 MG TABS tablet TAKE 1 TABLET BY MOUTH BEFORE BREAKFAST    . lisinopril-hydrochlorothiazide (ZESTORETIC) 10-12.5 MG tablet Take 0.5 tablets by mouth daily. 45 tablet 1  . Menthol, Topical Analgesic, (STOPAIN ROLL-ON) 8 % LIQD Apply 1 application topically 3 (three) times daily as needed (for knee pain.).    Marland Kitchen Methylcellulose, Laxative, (HM FIBER) 500 MG TABS Take 500 mg by mouth 2 (two) times daily. Fiberwell Gummies    . metoprolol succinate (TOPROL-XL) 25 MG 24 hr tablet Take 0.5 tablets (12.5 mg total) by mouth at bedtime. 45 tablet 3  . Multiple Vitamin (MULTIVITAMIN WITH MINERALS) TABS tablet Take 1 tablet by mouth every evening.     . Omega-3-6-9 CAPS Take 1 capsule by mouth every evening. 1600mg     . omeprazole (PRILOSEC) 20 MG capsule Take 1 capsule (20 mg total) by mouth daily. 90 capsule 3  . rosuvastatin (CRESTOR) 5 MG tablet Take 1 tablet (5 mg total) by mouth daily. (Patient taking differently: Take 5 mg by mouth at bedtime. ) 90 tablet 3  . sildenafil (VIAGRA) 100 MG tablet TAKE 1 TABLET BY MOUTH ONCE DAILY AS NEEDED 6 tablet 0  . traZODone (DESYREL) 100 MG tablet Take 1 tablet (100 mg total) by mouth at bedtime. 90 tablet 3  . trolamine salicylate (ASPERCREME) 10 % cream Apply 1 application topically as needed (knee pain).     Marland Kitchen VICTOZA 18 MG/3ML SOPN INJECT 1.8 MG SUBCUTANEOUSLY  ONCE DAILY 9 mL 0  . vitamin E 400 UNIT capsule Take 400 Units by mouth every evening.      No current facility-administered medications on file prior to visit.     Allergies  Allergen Reactions  . Pioglitazone Swelling  Family History  Problem Relation Age of Onset  . Cancer Mother        Colon Cancer  . Colon cancer Mother   . Colon polyps Brother   . Esophageal cancer Neg Hx   . Rectal cancer Neg Hx   . Stomach cancer Neg Hx     BP 124/70 (BP Location: Left Wrist, Patient Position: Sitting, Cuff Size: Large)   Pulse 89   Ht 5\' 11"  (1.803 m)   Wt 281 lb 12.8 oz (127.8 kg)   SpO2 96%   BMI 39.30 kg/m   Review of Systems He denies hypoglycemia.      Objective:   Physical Exam VITAL SIGNS:  See vs page GENERAL: no distress Pulses: dorsalis pedis intact bilat.   MSK: no deformity of the feet CV: trace bilat leg edema Skin:  no ulcer on the feet.  normal color and temp on the feet. Neuro: sensation is intact to touch on the feet Ext: there is bilateral onychomycosis of the toenails  Lab Results  Component Value Date   HGBA1C 8.0 (A) 01/08/2019   Lab Results  Component Value Date   CREATININE 1.82 (H) 11/21/2018   BUN 15 11/21/2018   NA 133 (L) 11/21/2018   K 4.2 11/21/2018   CL 100 11/21/2018   CO2 27 11/21/2018       Assessment & Plan:  HTN: is noted today Insulin-requiring type 2 DM, with PN: he needs increased rx Renal failure: in this setting, he does not need a PM insulin dose   Patient Instructions  Your blood pressure is high today.  Please see your primary care provider soon, to have it rechecked Please increase the insulin to 28 units with breakfast Please continue the same other diabetes medications.   check your blood sugar twice a day.  vary the time of day when you check, between before the 3 meals, and at bedtime.  also check if you have symptoms of your blood sugar being too  high or too low.  please keep a record of the readings and bring it to your next appointment here (or you can bring the meter itself).  You can write it on any piece of paper.  please call us sooner if your blood sugar goes below 70, or if you have a lot of readings over 200. Please come back for a follow-up appointment in 2-3 months.

## 2019-01-08 NOTE — Patient Instructions (Addendum)
Your blood pressure is high today.  Please see your primary care provider soon, to have it rechecked Please increase the insulin to 28 units with breakfast Please continue the same other diabetes medications.   check your blood sugar twice a day.  vary the time of day when you check, between before the 3 meals, and at bedtime.  also check if you have symptoms of your blood sugar being too high or too low.  please keep a record of the readings and bring it to your next appointment here (or you can bring the meter itself).  You can write it on any piece of paper.  please call us sooner if your blood sugar goes below 70, or if you have a lot of readings over 200. Please come back for a follow-up appointment in 2-3 months.

## 2019-01-12 ENCOUNTER — Other Ambulatory Visit: Payer: Self-pay | Admitting: Endocrinology

## 2019-01-12 ENCOUNTER — Other Ambulatory Visit: Payer: Self-pay | Admitting: Family

## 2019-01-12 DIAGNOSIS — E291 Testicular hypofunction: Secondary | ICD-10-CM

## 2019-02-12 ENCOUNTER — Other Ambulatory Visit: Payer: Self-pay | Admitting: Family

## 2019-02-12 DIAGNOSIS — E291 Testicular hypofunction: Secondary | ICD-10-CM

## 2019-02-19 ENCOUNTER — Other Ambulatory Visit: Payer: Self-pay | Admitting: Endocrinology

## 2019-02-19 NOTE — Telephone Encounter (Signed)
Please advise if refill is appropriate for the cream

## 2019-02-27 ENCOUNTER — Ambulatory Visit (INDEPENDENT_AMBULATORY_CARE_PROVIDER_SITE_OTHER): Payer: PRIVATE HEALTH INSURANCE | Admitting: Family

## 2019-02-27 ENCOUNTER — Encounter: Payer: Self-pay | Admitting: Family

## 2019-02-27 ENCOUNTER — Other Ambulatory Visit (INDEPENDENT_AMBULATORY_CARE_PROVIDER_SITE_OTHER): Payer: PRIVATE HEALTH INSURANCE

## 2019-02-27 ENCOUNTER — Other Ambulatory Visit: Payer: Self-pay

## 2019-02-27 VITALS — BP 120/70 | HR 83 | Temp 98.1°F | Ht 71.0 in | Wt 290.0 lb

## 2019-02-27 DIAGNOSIS — Z13220 Encounter for screening for lipoid disorders: Secondary | ICD-10-CM | POA: Diagnosis not present

## 2019-02-27 DIAGNOSIS — Z1321 Encounter for screening for nutritional disorder: Secondary | ICD-10-CM

## 2019-02-27 DIAGNOSIS — Z125 Encounter for screening for malignant neoplasm of prostate: Secondary | ICD-10-CM

## 2019-02-27 DIAGNOSIS — Z0000 Encounter for general adult medical examination without abnormal findings: Secondary | ICD-10-CM

## 2019-02-27 DIAGNOSIS — D61818 Other pancytopenia: Secondary | ICD-10-CM

## 2019-02-27 LAB — CBC WITH DIFFERENTIAL/PLATELET
Basophils Absolute: 0 10*3/uL (ref 0.0–0.1)
Basophils Relative: 0.6 % (ref 0.0–3.0)
Eosinophils Absolute: 0.3 10*3/uL (ref 0.0–0.7)
Eosinophils Relative: 8 % — ABNORMAL HIGH (ref 0.0–5.0)
HCT: 42.2 % (ref 39.0–52.0)
Hemoglobin: 14 g/dL (ref 13.0–17.0)
Lymphocytes Relative: 34.3 % (ref 12.0–46.0)
Lymphs Abs: 1.5 10*3/uL (ref 0.7–4.0)
MCHC: 33.2 g/dL (ref 30.0–36.0)
MCV: 87.2 fl (ref 78.0–100.0)
Monocytes Absolute: 0.4 10*3/uL (ref 0.1–1.0)
Monocytes Relative: 9.5 % (ref 3.0–12.0)
Neutro Abs: 2 10*3/uL (ref 1.4–7.7)
Neutrophils Relative %: 47.6 % (ref 43.0–77.0)
Platelets: 177 10*3/uL (ref 150.0–400.0)
RBC: 4.84 Mil/uL (ref 4.22–5.81)
RDW: 13.2 % (ref 11.5–15.5)
WBC: 4.3 10*3/uL (ref 4.0–10.5)

## 2019-02-27 LAB — COMPREHENSIVE METABOLIC PANEL
ALT: 19 U/L (ref 0–53)
AST: 16 U/L (ref 0–37)
Albumin: 4.3 g/dL (ref 3.5–5.2)
Alkaline Phosphatase: 66 U/L (ref 39–117)
BUN: 20 mg/dL (ref 6–23)
CO2: 27 mEq/L (ref 19–32)
Calcium: 9.4 mg/dL (ref 8.4–10.5)
Chloride: 100 mEq/L (ref 96–112)
Creatinine, Ser: 1.93 mg/dL — ABNORMAL HIGH (ref 0.40–1.50)
GFR: 42.47 mL/min — ABNORMAL LOW (ref >60.00)
Glucose, Bld: 186 mg/dL — ABNORMAL HIGH (ref 70–99)
Potassium: 4.3 mEq/L (ref 3.5–5.1)
Sodium: 136 mEq/L (ref 135–145)
Total Bilirubin: 0.5 mg/dL (ref 0.2–1.2)
Total Protein: 7.3 g/dL (ref 6.0–8.3)

## 2019-02-27 LAB — LIPID PANEL
Cholesterol: 194 mg/dL (ref 0–200)
HDL: 39.2 mg/dL (ref >39.00)
LDL Cholesterol: 129 mg/dL — ABNORMAL HIGH (ref 0–99)
NonHDL: 155.12
Total CHOL/HDL Ratio: 5
Triglycerides: 131 mg/dL (ref 0.0–149.0)
VLDL: 26.2 mg/dL (ref 0.0–40.0)

## 2019-02-27 LAB — VITAMIN D 25 HYDROXY (VIT D DEFICIENCY, FRACTURES): VITD: 57.73 ng/mL (ref 30.00–100.00)

## 2019-02-27 LAB — PSA: PSA: 0.83 ng/mL (ref 0.10–4.00)

## 2019-02-27 NOTE — Patient Instructions (Signed)

## 2019-02-27 NOTE — Progress Notes (Signed)
David Lindsey is a 63 y.o. male with the following history as recorded in [employer]:  Patient Active Problem List   Diagnosis Date Noted  . 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 Degenerative arthritis of left knee 11/18/2017  . Left shoulder pain 08/29/2017  . History of total right knee replacement (TKR) 03/25/2017  . Arthralgia 02/24/2017  . Edema 01/13/2017  . Wellness examination 01/30/2015  . Myalgia and myositis 01/28/2014  . Unspecified constipation 03/13/2013  . Screening for prostate cancer 01/18/2013  . Diabetes mellitus with renal manifestation ([place]) 01/18/2013  . Encounter for long-term (current) use of other medications 03/24/2012  . HYPOKALEMIA 06/06/2008  . Pancytopenia ([place]) 06/06/2008  . Hypogonadism, male 12/22/2007  . HYPERCHOLESTEROLEMIA 12/22/2007  . ALCOHOLISM 12/22/2007  . ESOPHAGITIS, REFLUX 12/22/2007  . Leg pain 12/22/2007  . Essential hypertension 11/10/2006  . Disorder resulting from impaired renal function 11/10/2006    Current Outpatient Medications  Medication Sig Dispense Refill  . amLODipine (NORVASC) 10 MG tablet Take 1 tablet (10 mg total) by mouth daily. 90 tablet 1  . Apple Cider Vinegar 600 MG CAPS Take 600 mg by mouth daily.    . Ascorbic Acid (VITAMIN C) 1000 MG tablet Take 1,000 mg by mouth every evening.    Marland Kitchen aspirin EC 81 MG tablet Take 1 tablet (81 mg total) by mouth 2 (two) times daily. 60 tablet 0  . Biotin w/ Vitamins C & E (HAIR SKIN & NAILS GUMMIES PO) Take 1 tablet by mouth every evening.    . Carboxymethylcellul-Glycerin (LUBRICATING EYE DROPS OP) Place 1 drop into both eyes daily as needed (dry eyes).    . clotrimazole-betamethasone (LOTRISONE) cream APPLY  CREAM TOPICALLY TO AFFECTED AREA THREE TIMES DAILY AS NEEDED FOR  RASH 45 g 0  . Coenzyme Q10 (COQ-10) 200 MG CAPS Take 200 mg by mouth every evening.     . Cyanocobalamin (B-12) 5000 MCG CAPS Take 5,000 mcg by mouth every evening.     . docusate sodium (COLACE)  100 MG capsule Take 200 mg by mouth every evening.    Marland Kitchen EASY TOUCH PEN NEEDLES 31G X 8 MM MISC USE ONCE DAILY WITH VICTOZA 100 each 11  . gabapentin (NEURONTIN) 300 MG capsule Take 1 capsule (300 mg total) by mouth 3 (three) times daily. 270 capsule 2  . glucose blood (ONE TOUCH ULTRA TEST) test strip Use as instructed check blood sugar two times daily     . Insulin Lispro Prot & Lispro (HUMALOG MIX 75/25 KWIKPEN) (75-25) 100 UNIT/ML Kwikpen Inject 28 Units into the skin daily with breakfast. 15 mL 11  . INVOKANA 100 MG TABS tablet TAKE 1 TABLET BY MOUTH BEFORE BREAKFAST    . lisinopril-hydrochlorothiazide (ZESTORETIC) 10-12.5 MG tablet Take 0.5 tablets by mouth daily. 45 tablet 1  . Menthol, Topical Analgesic, (STOPAIN ROLL-ON) 8 % LIQD Apply 1 application topically 3 (three) times daily as needed (for knee pain.).    Marland Kitchen Methylcellulose, Laxative, (HM FIBER) 500 MG TABS Take 500 mg by mouth 2 (two) times daily. Fiberwell Gummies    . metoprolol succinate (TOPROL-XL) 25 MG 24 hr tablet Take 0.5 tablets (12.5 mg total) by mouth at bedtime. 45 tablet 3  . Multiple Vitamin (MULTIVITAMIN WITH MINERALS) TABS tablet Take 1 tablet by mouth every evening.     . Omega-3-6-9 CAPS Take 1 capsule by mouth every evening. 164m    . omeprazole (PRILOSEC) 20 MG capsule Take 1 capsule (20 mg total) by mouth daily.  90 capsule 3  . rosuvastatin (CRESTOR) 5 MG tablet Take 1 tablet (5 mg total) by mouth daily. (Patient taking differently: Take 5 mg by mouth at bedtime. ) 90 tablet 3  . sildenafil (VIAGRA) 100 MG tablet TAKE 1 TABLET BY MOUTH ONCE DAILY AS NEEDED 6 tablet 0  . traZODone (DESYREL) 100 MG tablet Take 1 tablet (100 mg total) by mouth at bedtime. 90 tablet 3  . trolamine salicylate (ASPERCREME) 10 % cream Apply 1 application topically as needed (knee pain).    Marland Kitchen VICTOZA 18 MG/3ML SOPN INJECT 1.8 MG SUBCUTANEOUSLY ONCE DAILY 9 mL 0  . vitamin E 400 UNIT capsule Take 400 Units by mouth every evening.       No current facility-administered medications for this visit.     Allergies: Pioglitazone  Past Medical History:  Diagnosis Date  . ALCOHOLISM 12/22/2007  . ANEMIA, IRON DEFICIENCY 12/26/2007  . Arthritis    OA  . Blood transfusion without reported diagnosis    as a child  . DIABETES MELLITUS, TYPE II 11/10/2006  . ESOPHAGITIS, REFLUX 12/22/2007  . GERD (gastroesophageal reflux disease)   . HYPERCHOLESTEROLEMIA 12/22/2007  . HYPERTENSION 11/10/2006  . HYPOGONADISM 12/22/2007   Prob. due to ETOH  . HYPOKALEMIA 06/06/2008  . Leukopenia   . Neuropathy    Chronic Neuropathy BOTH FEET  . Pancytopenia 06/06/2008  . RENAL INSUFFICIENCY 11/10/2006   SEES [place] [facility] STAGE 2 CKD    Past Surgical History:  Procedure Laterality Date  . APPENDECTOMY  AGE 61 OR 10  . COLONOSCOPY  08-21-2004   [facility]- normal  . COLONSCOPY  2017  . ELECTROCARDIOGRAM  11/07/2006  . ESOPHAGOGASTRODUODENOSCOPY  08/20/2004  . EYE SURGERY Bilateral 2014   [facility] FOR CATARACTS  . REPLACEMENT TOTAL KNEE Right 2011  . TOTAL KNEE ARTHROPLASTY Left 02/20/2018   Procedure: LEFT TOTAL KNEE ARTHROPLASTY;  Surgeon: Frederik Pear, MD;  Location: [facility];  Service: Orthopedics;  Laterality: Left;  . UPPER GASTROINTESTINAL ENDOSCOPY      Family History  Problem Relation Age of Onset  . Cancer Mother        Colon Cancer  . Colon cancer Mother   . Colon polyps Brother   . Esophageal cancer Neg Hx   . Rectal cancer Neg Hx   . Stomach cancer Neg Hx     Social History   Tobacco Use  . Smoking status: Never Smoker  . Smokeless tobacco: Never Used  Substance Use Topics  . Alcohol use: No    Alcohol/week: 0.0 standard drinks    Comment: LAST USE 10 YRS AGO    Subjective:  Presents for yearly CPE; in baseline state of health today; continuing to work with his endocrinologist for management of his diabetes;   Health Maintenance  Topic Date Due  . OPHTHALMOLOGY EXAM  05/12/2019  . FOOT EXAM  05/23/2019  . HEMOGLOBIN A1C   07/08/2019  . COLONOSCOPY  05/11/2020  . PNA vac Low Risk Adult (2 of 2 - PPSV23) 02/24/2022  . TETANUS/TDAP  02/01/2026  . INFLUENZA VACCINE  Completed  . Hepatitis C Screening  Completed  . HIV Screening  Completed     Review of Systems  Constitutional: Negative.   HENT: Negative.   Eyes: Negative.   Respiratory: Negative.   Cardiovascular: Negative.   Gastrointestinal: Negative.   Genitourinary: Negative.   Musculoskeletal: Negative.   Skin: Negative.   Neurological: Negative.   Endo/Heme/Allergies: Negative.   Psychiatric/Behavioral: Negative.  Objective:  Vitals:   02/27/19 0915  BP: 120/70  Pulse: 83  Temp: 98.1 F (36.7 C)  TempSrc: Oral  SpO2: 97%  Weight: 290 lb (131.5 kg)  Height: _0  (1.803 m)    General: Well developed, well nourished, in no acute distress  Skin : Warm and dry.  Head: Normocephalic and atraumatic  Eyes: Sclera and conjunctiva clear; pupils round and reactive to light; extraocular movements intact  Ears: External normal; canals clear; tympanic membranes normal  Oropharynx: Pink, supple. No suspicious lesions  Neck: Supple without thyromegaly, adenopathy  Lungs: Respirations unlabored; clear to auscultation bilaterally without wheeze, rales, rhonchi  CVS exam: normal rate and regular rhythm.  Abdomen: Soft; nontender; nondistended; normoactive bowel sounds; no masses or hepatosplenomegaly  Musculoskeletal: No deformities; no active joint inflammation  Extremities: No edema, cyanosis, clubbing  Vessels: Symmetric bilaterally  Neurologic: Alert and oriented; speech intact; face symmetrical; moves all extremities well; CNII-XII intact without focal deficit   Assessment:  1. PE (physical exam), annual   2. Lipid screening   3. Prostate cancer screening   4. Encounter for vitamin deficiency screening   5. Pancytopenia ([place]) Chronic    Plan:  Age appropriate preventive healthcare needs addressed; encouraged regular eye doctor  and dental exams; encouraged regular exercise; will update labs and refills as needed today; follow-up to be determined; Continue with Dr. Loanne Drilling as scheduled for management/ follow-up diabetes;   No follow-ups on file.  Orders Placed This Encounter  Procedures  . CBC w/Diff    Standing Status:   Future    Standing Expiration Date:   02/27/2020  . Comp Met (CMET)    Standing Status:   Future    Standing Expiration Date:   02/27/2020  . Lipid panel    Standing Status:   Future    Standing Expiration Date:   02/27/2020  . PSA    Standing Status:   Future    Standing Expiration Date:   02/27/2020  . Vitamin D (25 hydroxy)    Standing Status:   Future    Standing Expiration Date:   02/27/2020    Requested Prescriptions    No prescriptions requested or ordered in this encounter

## 2019-03-04 ENCOUNTER — Other Ambulatory Visit: Payer: Self-pay | Admitting: Family

## 2019-03-16 ENCOUNTER — Other Ambulatory Visit: Payer: Self-pay | Admitting: Family

## 2019-03-16 DIAGNOSIS — E291 Testicular hypofunction: Secondary | ICD-10-CM

## 2019-04-11 ENCOUNTER — Other Ambulatory Visit: Payer: Self-pay | Admitting: Family

## 2019-04-12 ENCOUNTER — Other Ambulatory Visit: Payer: Self-pay | Admitting: Endocrinology

## 2019-04-12 NOTE — Telephone Encounter (Signed)
Per routine 

## 2019-04-15 NOTE — Telephone Encounter (Signed)
1.  Please schedule f/u appt 2.  Then please refill x 1, pending that appt.  

## 2019-04-16 NOTE — Telephone Encounter (Signed)
Per Dr. Loanne Drilling, unable to refill Victoza without an appt. Routing this message to the front desk for scheduling purposes.

## 2019-04-17 ENCOUNTER — Ambulatory Visit: Payer: PRIVATE HEALTH INSURANCE | Admitting: Endocrinology

## 2019-04-19 ENCOUNTER — Encounter: Payer: Self-pay | Admitting: Endocrinology

## 2019-04-19 ENCOUNTER — Ambulatory Visit (INDEPENDENT_AMBULATORY_CARE_PROVIDER_SITE_OTHER): Payer: PRIVATE HEALTH INSURANCE | Admitting: Endocrinology

## 2019-04-19 ENCOUNTER — Other Ambulatory Visit: Payer: Self-pay

## 2019-04-19 VITALS — BP 124/78 | HR 107 | Ht 71.0 in | Wt 294.0 lb

## 2019-04-19 DIAGNOSIS — E1122 Type 2 diabetes mellitus with diabetic chronic kidney disease: Secondary | ICD-10-CM | POA: Diagnosis not present

## 2019-04-19 DIAGNOSIS — N181 Chronic kidney disease, stage 1: Secondary | ICD-10-CM

## 2019-04-19 LAB — POCT GLYCOSYLATED HEMOGLOBIN (HGB A1C): Hemoglobin A1C: 7.7 % — AB (ref 4.0–5.6)

## 2019-04-19 MED ORDER — VICTOZA 18 MG/3ML ~~LOC~~ SOPN: 1.8000 mg | PEN_INJECTOR | SUBCUTANEOUS | 3 refills | Status: DC

## 2019-04-19 MED ORDER — FARXIGA 10 MG PO TABS: 10.0000 mg | ORAL_TABLET | Freq: Every day | ORAL | 3 refills | Status: DC

## 2019-04-19 NOTE — Progress Notes (Signed)
Subjective:    Patient ID: David Lindsey, male    DOB: 22-Nov-1955, 64 y.o.   MRN: [id number]  HPI Pt returns for f/u of diabetes mellitus: DM type: Insulin-requiring type 2.   Dx'ed: 0000000 Complications: renal insufficiency and PN Therapy: insulin since 2019, [place], and [place].   DKA: never.   Severe hypoglycemia: never.   Pancreatitis: never.   Other: he did not tolerate parlodel (tremor); he cannot take pioglitizone (edema); he works 3rd shift; he takes qd insulin, after poor results with multiple daily injections; he is retired.   Interval history: no cbg record, but states cbg's vary from 80-240.  pt states he feels well in general.   Past Medical History:  Diagnosis Date  . ALCOHOLISM 12/22/2007  . ANEMIA, IRON DEFICIENCY 12/26/2007  . Arthritis    OA  . Blood transfusion without reported diagnosis    as a child  . DIABETES MELLITUS, TYPE II 11/10/2006  . ESOPHAGITIS, REFLUX 12/22/2007  . GERD (gastroesophageal reflux disease)   . HYPERCHOLESTEROLEMIA 12/22/2007  . HYPERTENSION 11/10/2006  . HYPOGONADISM 12/22/2007   Prob. due to ETOH  . HYPOKALEMIA 06/06/2008  . Leukopenia   . Neuropathy    Chronic Neuropathy BOTH FEET  . Pancytopenia 06/06/2008  . RENAL INSUFFICIENCY 11/10/2006   SEES [place] [facility] STAGE 2 CKD    Past Surgical History:  Procedure Laterality Date  . APPENDECTOMY  AGE 77 OR 10  . COLONOSCOPY  08-21-2004   [facility]- normal  . COLONSCOPY  2017  . ELECTROCARDIOGRAM  11/07/2006  . ESOPHAGOGASTRODUODENOSCOPY  08/20/2004  . EYE SURGERY Bilateral 2014   [facility] FOR CATARACTS  . REPLACEMENT TOTAL KNEE Right 2011  . TOTAL KNEE ARTHROPLASTY Left 02/20/2018   Procedure: LEFT TOTAL KNEE ARTHROPLASTY;  Surgeon: Frederik Pear, MD;  Location: [facility];  Service: Orthopedics;  Laterality: Left;  . UPPER GASTROINTESTINAL ENDOSCOPY      Social History   Socioeconomic History  . Marital status: Married    Spouse name: Not on file  . Number of children: Not on file  .  Years of education: Not on file  . Highest education level: Not on file  Occupational History  . 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 Smoking status: Never Smoker  . Smokeless tobacco: Never Used  Substance and Sexual Activity  . Alcohol use: No    Alcohol/week: 0.0 standard drinks    Comment: LAST USE 10 YRS AGO  . Drug use: No  . Sexual activity: Not on file  Other Topics Concern  . Not on file  Social History Narrative  . Not on file   Social Determinants of Health   Financial Resource Strain:   . Difficulty of Paying Living Expenses: Not on file  Food Insecurity:   . Worried About Charity fundraiser in the Last Year: Not on file  . Ran Out of Food in the Last Year: Not on file  Transportation Needs:   . Lack of Transportation (Medical): Not on file  . Lack of Transportation (Non-Medical): Not on file  Physical Activity:   . Days of Exercise per Week: Not on file  . Minutes of Exercise per Session: Not on file  Stress:   . Feeling of Stress : Not on file  Social Connections:   . Frequency of Communication with Friends and Family: Not on file  . Frequency of Social Gatherings with Friends and Family: Not on file  . Attends Religious  Services: Not on file  . Active Member of Clubs or Organizations: Not on file  . Attends Archivist Meetings: Not on file  . Marital Status: Not on file  Intimate Partner Violence:   . Fear of Current or Ex-Partner: Not on file  . Emotionally Abused: Not on file  . Physically Abused: Not on file  . Sexually Abused: Not on file    Current Outpatient Medications on File Prior to Visit  Medication Sig Dispense Refill  . amLODipine (NORVASC) 10 MG tablet Take 1 tablet (10 mg total) by mouth daily. 90 tablet 1  . Apple Cider Vinegar 600 MG CAPS Take 600 mg by mouth daily.    . Ascorbic Acid (VITAMIN C) 1000 MG tablet Take 1,000 mg by mouth every evening.    Marland Kitchen aspirin EC 81 MG tablet Take 1 tablet  (81 mg total) by mouth 2 (two) times daily. 60 tablet 0  . Biotin w/ Vitamins C & E (HAIR SKIN & NAILS GUMMIES PO) Take 1 tablet by mouth every evening.    . Carboxymethylcellul-Glycerin (LUBRICATING EYE DROPS OP) Place 1 drop into both eyes daily as needed (dry eyes).    . clotrimazole-betamethasone (LOTRISONE) cream APPLY  CREAM TOPICALLY TO AFFECTED AREA THREE TIMES DAILY AS NEEDED FOR  RASH 45 g 0  . Coenzyme Q10 (COQ-10) 200 MG CAPS Take 200 mg by mouth every evening.     . Cyanocobalamin (B-12) 5000 MCG CAPS Take 5,000 mcg by mouth every evening.     . docusate sodium (COLACE) 100 MG capsule Take 200 mg by mouth every evening.    Marland Kitchen EASY TOUCH PEN NEEDLES 31G X 8 MM MISC USE ONCE DAILY WITH VICTOZA 100 each 11  . gabapentin (NEURONTIN) 300 MG capsule Take 1 capsule (300 mg total) by mouth 3 (three) times daily. 270 capsule 2  . glucose blood (ONE TOUCH ULTRA TEST) test strip Use as instructed check blood sugar two times daily     . Insulin Lispro Prot & Lispro (HUMALOG MIX 75/25 KWIKPEN) (75-25) 100 UNIT/ML Kwikpen Inject 28 Units into the skin daily with breakfast. 15 mL 11  . lisinopril-hydrochlorothiazide (ZESTORETIC) 10-12.5 MG tablet TAKE ONE-HALF TABLET BY  MOUTH DAILY 45 tablet 3  . Menthol, Topical Analgesic, (STOPAIN ROLL-ON) 8 % LIQD Apply 1 application topically 3 (three) times daily as needed (for knee pain.).    Marland Kitchen Methylcellulose, Laxative, (HM FIBER) 500 MG TABS Take 500 mg by mouth 2 (two) times daily. Fiberwell Gummies    . metoprolol succinate (TOPROL-XL) 25 MG 24 hr tablet Take 0.5 tablets (12.5 mg total) by mouth at bedtime. 45 tablet 3  . Multiple Vitamin (MULTIVITAMIN WITH MINERALS) TABS tablet Take 1 tablet by mouth every evening.     . Omega-3-6-9 CAPS Take 1 capsule by mouth every evening. 1600mg     . omeprazole (PRILOSEC) 20 MG capsule Take 1 capsule (20 mg total) by mouth daily. 90 capsule 3  . rosuvastatin (CRESTOR) 5 MG tablet Take 1 tablet (5 mg total) by mouth  daily. (Patient taking differently: Take 5 mg by mouth at bedtime. ) 90 tablet 3  . sildenafil (VIAGRA) 100 MG tablet TAKE 1 TABLET BY MOUTH ONCE DAILY AS NEEDED 6 tablet 2  . traZODone (DESYREL) 100 MG tablet TAKE 1 TABLET BY MOUTH AT  BEDTIME 90 tablet 3  . trolamine salicylate (ASPERCREME) 10 % cream Apply 1 application topically as needed (knee pain).    . vitamin E 400 UNIT capsule  Take 400 Units by mouth every evening.      No current facility-administered medications on file prior to visit.    Allergies  Allergen Reactions  . Pioglitazone Swelling    Family History  Problem Relation Age of Onset  . Cancer Mother        Colon Cancer  . Colon cancer Mother   . Colon polyps Brother   . Esophageal cancer Neg Hx   . Rectal cancer Neg Hx   . Stomach cancer Neg Hx     BP 124/78 (BP Location: Right Arm, Patient Position: Sitting, Cuff Size: Large)   Pulse (!) 107   Ht 5\' 11"  (1.803 m)   Wt 294 lb (133.4 kg)   SpO2 96%   BMI 41.00 kg/m    Review of Systems He denies hypoglycemia.      Objective:   Physical Exam VITAL SIGNS:  See vs page GENERAL: no distress Pulses: dorsalis pedis intact bilat.   MSK: no deformity of the feet CV: 2+ left, and trace right leg edema Skin:  no ulcer on the feet.  normal color and temp on the feet. Neuro: sensation is intact to touch on the feet Ext: there is bilateral onychomycosis of the toenails.    Lab Results  Component Value Date   TSH 1.08 01/19/2018    Lab Results  Component Value Date   HGBA1C 7.7 (A) 04/19/2019   Lab Results  Component Value Date   CREATININE 1.93 (H) 02/27/2019   BUN 20 02/27/2019   NA 136 02/27/2019   K 4.3 02/27/2019   CL 100 02/27/2019   CO2 27 02/27/2019      Assessment & Plan:  Insulin-requiring type 2 DM, with PN: this is the best control this pt should aim for, given this regimen, which does match insulin to his changing needs throughout the day.   Renal insuff: This limits rx  options Edema: This also limits rx   Patient Instructions  Please continue the same diabetes medications.   check your blood sugar twice a day.  vary the time of day when you check, between before the 3 meals, and at bedtime.  also check if you have symptoms of your blood sugar being too high or too low.  please keep a record of the readings and bring it to your next appointment here (or you can bring the meter itself).  You can write it on any piece of paper.  please call us sooner if your blood sugar goes below 70, or if you have a lot of readings over 200. Please come back for a follow-up appointment in 3 months.

## 2019-04-19 NOTE — Patient Instructions (Addendum)
Please continue the same diabetes medications.   check your blood sugar twice a day.  vary the time of day when you check, between before the 3 meals, and at bedtime.  also check if you have symptoms of your blood sugar being too high or too low.  please keep a record of the readings and bring it to your next appointment here (or you can bring the meter itself).  You can write it on any piece of paper.  please call us sooner if your blood sugar goes below 70, or if you have a lot of readings over 200. Please come back for a follow-up appointment in 3 months.   

## 2019-04-24 ENCOUNTER — Encounter: Payer: Self-pay | Admitting: Endocrinology

## 2019-04-25 NOTE — Telephone Encounter (Signed)
Please advise 

## 2019-05-04 ENCOUNTER — Ambulatory Visit (INDEPENDENT_AMBULATORY_CARE_PROVIDER_SITE_OTHER): Payer: PRIVATE HEALTH INSURANCE | Admitting: Family

## 2019-05-04 ENCOUNTER — Ambulatory Visit (HOSPITAL_COMMUNITY)
Admission: RE | Admit: 2019-05-04 | Discharge: 2019-05-04 | Disposition: A | Discharge status: Home / Self Care | Payer: Medicare Other | Source: Ambulatory Visit | Attending: Cardiology | Admitting: Cardiology

## 2019-05-04 ENCOUNTER — Other Ambulatory Visit: Payer: Self-pay

## 2019-05-04 ENCOUNTER — Encounter: Payer: Self-pay | Admitting: Family

## 2019-05-04 VITALS — BP 124/82 | HR 110 | Temp 98.5°F | Ht 71.0 in | Wt 295.8 lb

## 2019-05-04 DIAGNOSIS — M7989 Other specified soft tissue disorders: Secondary | ICD-10-CM | POA: Insufficient documentation

## 2019-05-04 DIAGNOSIS — M79605 Pain in left leg: Secondary | ICD-10-CM

## 2019-05-04 LAB — CBC WITH DIFFERENTIAL/PLATELET
Basophils Absolute: 0 10*3/uL (ref 0.0–0.1)
Basophils Relative: 0.3 % (ref 0.0–3.0)
Eosinophils Absolute: 0 10*3/uL (ref 0.0–0.7)
Eosinophils Relative: 0.2 % (ref 0.0–5.0)
HCT: 43.1 % (ref 39.0–52.0)
Hemoglobin: 14.2 g/dL (ref 13.0–17.0)
Lymphocytes Relative: 9.5 % — ABNORMAL LOW (ref 12.0–46.0)
Lymphs Abs: 0.9 10*3/uL (ref 0.7–4.0)
MCHC: 33 g/dL (ref 30.0–36.0)
MCV: 86.5 fl (ref 78.0–100.0)
Monocytes Absolute: 0.5 10*3/uL (ref 0.1–1.0)
Monocytes Relative: 5.9 % (ref 3.0–12.0)
Neutro Abs: 7.7 10*3/uL (ref 1.4–7.7)
Neutrophils Relative %: 84.1 % — ABNORMAL HIGH (ref 43.0–77.0)
Platelets: 157 10*3/uL (ref 150.0–400.0)
RBC: 4.98 Mil/uL (ref 4.22–5.81)
RDW: 13.3 % (ref 11.5–15.5)
WBC: 9.2 10*3/uL (ref 4.0–10.5)

## 2019-05-04 LAB — COMPREHENSIVE METABOLIC PANEL
ALT: 17 U/L (ref 0–53)
AST: 13 U/L (ref 0–37)
Albumin: 4.2 g/dL (ref 3.5–5.2)
Alkaline Phosphatase: 61 U/L (ref 39–117)
BUN: 19 mg/dL (ref 6–23)
CO2: 26 mEq/L (ref 19–32)
Calcium: 9.4 mg/dL (ref 8.4–10.5)
Chloride: 93 mEq/L — ABNORMAL LOW (ref 96–112)
Creatinine, Ser: 2.01 mg/dL — ABNORMAL HIGH (ref 0.40–1.50)
GFR: 40.5 mL/min — ABNORMAL LOW (ref >60.00)
Glucose, Bld: 251 mg/dL — ABNORMAL HIGH (ref 70–99)
Potassium: 3.9 mEq/L (ref 3.5–5.1)
Sodium: 130 mEq/L — ABNORMAL LOW (ref 135–145)
Total Bilirubin: 0.6 mg/dL (ref 0.2–1.2)
Total Protein: 8 g/dL (ref 6.0–8.3)

## 2019-05-04 MED ORDER — FUROSEMIDE 20 MG PO TABS: ORAL_TABLET | ORAL | 0 refills | Status: DC

## 2019-05-04 MED ORDER — CEPHALEXIN 500 MG PO CAPS: 500.0000 mg | ORAL_CAPSULE | Freq: Three times a day (TID) | ORAL | 0 refills | Status: DC

## 2019-05-04 NOTE — Progress Notes (Signed)
David Lindsey is a 64 y.o. male with the following history as recorded in [employer]:  Patient Active Problem List   Diagnosis Date Noted  . 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 Degenerative arthritis of left knee 11/18/2017  . Left shoulder pain 08/29/2017  . History of total right knee replacement (TKR) 03/25/2017  . Arthralgia 02/24/2017  . Edema 01/13/2017  . Wellness examination 01/30/2015  . Myalgia and myositis 01/28/2014  . Unspecified constipation 03/13/2013  . Screening for prostate cancer 01/18/2013  . Diabetes mellitus with renal manifestation ([place]) 01/18/2013  . Encounter for long-term (current) use of other medications 03/24/2012  . HYPOKALEMIA 06/06/2008  . Pancytopenia ([place]) 06/06/2008  . Hypogonadism, male 12/22/2007  . HYPERCHOLESTEROLEMIA 12/22/2007  . ALCOHOLISM 12/22/2007  . ESOPHAGITIS, REFLUX 12/22/2007  . Leg pain 12/22/2007  . Essential hypertension 11/10/2006  . Disorder resulting from impaired renal function 11/10/2006    Current Outpatient Medications  Medication Sig Dispense Refill  . amLODipine (NORVASC) 10 MG tablet Take 1 tablet (10 mg total) by mouth daily. 90 tablet 1  . Apple Cider Vinegar 600 MG CAPS Take 600 mg by mouth daily.    . Ascorbic Acid (VITAMIN C) 1000 MG tablet Take 1,000 mg by mouth every evening.    Marland Kitchen aspirin EC 81 MG tablet Take 1 tablet (81 mg total) by mouth 2 (two) times daily. 60 tablet 0  . Biotin w/ Vitamins C & E (HAIR SKIN & NAILS GUMMIES PO) Take 1 tablet by mouth every evening.    . Carboxymethylcellul-Glycerin (LUBRICATING EYE DROPS OP) Place 1 drop into both eyes daily as needed (dry eyes).    . clotrimazole-betamethasone (LOTRISONE) cream APPLY  CREAM TOPICALLY TO AFFECTED AREA THREE TIMES DAILY AS NEEDED FOR  RASH 45 g 0  . Coenzyme Q10 (COQ-10) 200 MG CAPS Take 200 mg by mouth every evening.     . Cyanocobalamin (B-12) 5000 MCG CAPS Take 5,000 mcg by mouth every evening.     . dapagliflozin propanediol  (FARXIGA) 10 MG TABS tablet Take 10 mg by mouth daily. 90 tablet 3  . docusate sodium (COLACE) 100 MG capsule Take 200 mg by mouth every evening.    Marland Kitchen EASY TOUCH PEN NEEDLES 31G X 8 MM MISC USE ONCE DAILY WITH VICTOZA 100 each 11  . gabapentin (NEURONTIN) 300 MG capsule Take 1 capsule (300 mg total) by mouth 3 (three) times daily. 270 capsule 2  . glucose blood (ONE TOUCH ULTRA TEST) test strip Use as instructed check blood sugar two times daily     . Insulin Lispro Prot & Lispro (HUMALOG MIX 75/25 KWIKPEN) (75-25) 100 UNIT/ML Kwikpen Inject 28 Units into the skin daily with breakfast. 15 mL 11  . liraglutide (VICTOZA) 18 MG/3ML SOPN Inject 0.3 mLs (1.8 mg total) into the skin every morning. 9 mL 3  . lisinopril-hydrochlorothiazide (ZESTORETIC) 10-12.5 MG tablet TAKE ONE-HALF TABLET BY  MOUTH DAILY 45 tablet 3  . Menthol, Topical Analgesic, (STOPAIN ROLL-ON) 8 % LIQD Apply 1 application topically 3 (three) times daily as needed (for knee pain.).    Marland Kitchen Methylcellulose, Laxative, (HM FIBER) 500 MG TABS Take 500 mg by mouth 2 (two) times daily. Fiberwell Gummies    . metoprolol succinate (TOPROL-XL) 25 MG 24 hr tablet Take 0.5 tablets (12.5 mg total) by mouth at bedtime. 45 tablet 3  . Multiple Vitamin (MULTIVITAMIN WITH MINERALS) TABS tablet Take 1 tablet by mouth every evening.     . Omega-3-6-9 CAPS Take 1  capsule by mouth every evening. '1600mg'$     . omeprazole (PRILOSEC) 20 MG capsule Take 1 capsule (20 mg total) by mouth daily. 90 capsule 3  . rosuvastatin (CRESTOR) 5 MG tablet Take 1 tablet (5 mg total) by mouth daily. (Patient taking differently: Take 5 mg by mouth at bedtime. ) 90 tablet 3  . sildenafil (VIAGRA) 100 MG tablet TAKE 1 TABLET BY MOUTH ONCE DAILY AS NEEDED 6 tablet 2  . traZODone (DESYREL) 100 MG tablet TAKE 1 TABLET BY MOUTH AT  BEDTIME 90 tablet 3  . trolamine salicylate (ASPERCREME) 10 % cream Apply 1 application topically as needed (knee pain).    . vitamin E 400 UNIT capsule  Take 400 Units by mouth every evening.     . cephALEXin (KEFLEX) 500 MG capsule Take 1 capsule (500 mg total) by mouth 3 (three) times daily. 21 capsule 0  . furosemide (LASIX) 20 MG tablet Take 1 per day as directed; may take a second dose if needed 30 tablet 0   No current facility-administered medications for this visit.    Allergies: Pioglitazone  Past Medical History:  Diagnosis Date  . ALCOHOLISM 12/22/2007  . ANEMIA, IRON DEFICIENCY 12/26/2007  . Arthritis    OA  . Blood transfusion without reported diagnosis    as a child  . DIABETES MELLITUS, TYPE II 11/10/2006  . ESOPHAGITIS, REFLUX 12/22/2007  . GERD (gastroesophageal reflux disease)   . HYPERCHOLESTEROLEMIA 12/22/2007  . HYPERTENSION 11/10/2006  . HYPOGONADISM 12/22/2007   Prob. due to ETOH  . HYPOKALEMIA 06/06/2008  . Leukopenia   . Neuropathy    Chronic Neuropathy BOTH FEET  . Pancytopenia 06/06/2008  . RENAL INSUFFICIENCY 11/10/2006   SEES [place] [facility] STAGE 2 CKD    Past Surgical History:  Procedure Laterality Date  . APPENDECTOMY  AGE 49 OR 10  . COLONOSCOPY  08-21-2004   [facility]- normal  . COLONSCOPY  2017  . ELECTROCARDIOGRAM  11/07/2006  . ESOPHAGOGASTRODUODENOSCOPY  08/20/2004  . EYE SURGERY Bilateral 2014   [facility] FOR CATARACTS  . REPLACEMENT TOTAL KNEE Right 2011  . TOTAL KNEE ARTHROPLASTY Left 02/20/2018   Procedure: LEFT TOTAL KNEE ARTHROPLASTY;  Surgeon: Frederik Pear, MD;  Location: [facility];  Service: Orthopedics;  Laterality: Left;  . UPPER GASTROINTESTINAL ENDOSCOPY      Family History  Problem Relation Age of Onset  . Cancer Mother        Colon Cancer  . Colon cancer Mother   . Colon polyps Brother   . Esophageal cancer Neg Hx   . Rectal cancer Neg Hx   . Stomach cancer Neg Hx     Social History   Tobacco Use  . Smoking status: Never Smoker  . Smokeless tobacco: Never Used  Substance Use Topics  . Alcohol use: No    Alcohol/week: 0.0 standard drinks    Comment: LAST USE 10 YRS AGO     Subjective:  Patient presents with concerns for intermittent swelling of left leg/ lower extremity; first noted 2 weeks ago but seemed to improve; flared back up 3 days ago and has progressively worsened; no symptoms in right leg; no cough, chest pain or shortness of breath;    Objective:  Vitals:   05/04/19 1309  BP: 124/82  Pulse: (!) 110  Temp: 98.5 F (36.9 C)  TempSrc: Oral  SpO2: 99%  Weight: 295 lb 12.8 oz (134.2 kg)  Height: '5\' 11"'$  (1.803 m)    General: Well developed, well nourished, in no  acute distress  Skin : Warm and dry.  Head: Normocephalic and atraumatic  Lungs: Respirations unlabored; clear to auscultation bilaterally without wheeze, rales, rhonchi  Musculoskeletal: No deformities; no active joint inflammation; marked swelling and erythema of left lower extremity; at least 2 inch difference between legs; negative Homan's sign;  Extremities: No edema, cyanosis, clubbing  Vessels: Symmetric bilaterally  Neurologic: Alert and oriented; speech intact; face symmetrical; moves all extremities well; CNII-XII intact without focal deficit   Assessment:  1. Pain and swelling of lower extremity, left     Plan:  STAT doppler did not show DVT; will check CBC, CMP today; Will treat for celluitis secondary to venous stasis; start Lasix and Keflex; return for re-check on Monday; encourage to elevate as much as possible;    This visit occurred during the SARS-CoV-2 public health emergency.  Safety protocols were in place, including screening questions prior to the visit, additional usage of staff PPE, and extensive cleaning of exam room while observing appropriate contact time as indicated for disinfecting solutions.     No follow-ups on file.  Orders Placed This Encounter  Procedures  . CBC w/Diff  . Comp Met (CMET)    Requested Prescriptions   Signed Prescriptions Disp Refills  . furosemide (LASIX) 20 MG tablet 30 tablet 0    Sig: Take 1 per day as directed; may take  a second dose if needed  . cephALEXin (KEFLEX) 500 MG capsule 21 capsule 0    Sig: Take 1 capsule (500 mg total) by mouth 3 (three) times daily.

## 2019-05-07 ENCOUNTER — Ambulatory Visit (INDEPENDENT_AMBULATORY_CARE_PROVIDER_SITE_OTHER): Payer: PRIVATE HEALTH INSURANCE | Admitting: Family

## 2019-05-07 ENCOUNTER — Encounter (HOSPITAL_COMMUNITY): Payer: Self-pay

## 2019-05-07 ENCOUNTER — Emergency Department (HOSPITAL_COMMUNITY)
Admission: EM | Admit: 2019-05-07 | Discharge: 2019-05-07 | Disposition: A | Discharge status: Home / Self Care | Payer: PRIVATE HEALTH INSURANCE | Attending: Emergency Medicine | Admitting: Emergency Medicine

## 2019-05-07 ENCOUNTER — Ambulatory Visit (INDEPENDENT_AMBULATORY_CARE_PROVIDER_SITE_OTHER): Payer: PRIVATE HEALTH INSURANCE

## 2019-05-07 ENCOUNTER — Other Ambulatory Visit: Payer: Self-pay

## 2019-05-07 ENCOUNTER — Encounter: Payer: Self-pay | Admitting: Family

## 2019-05-07 VITALS — BP 130/80 | HR 91 | Temp 99.3°F | Ht 71.0 in | Wt 291.8 lb

## 2019-05-07 DIAGNOSIS — R2242 Localized swelling, mass and lump, left lower limb: Secondary | ICD-10-CM | POA: Diagnosis not present

## 2019-05-07 DIAGNOSIS — M25562 Pain in left knee: Secondary | ICD-10-CM | POA: Diagnosis not present

## 2019-05-07 DIAGNOSIS — M25462 Effusion, left knee: Secondary | ICD-10-CM

## 2019-05-07 DIAGNOSIS — E119 Type 2 diabetes mellitus without complications: Secondary | ICD-10-CM | POA: Insufficient documentation

## 2019-05-07 DIAGNOSIS — Z79899 Other long term (current) drug therapy: Secondary | ICD-10-CM | POA: Insufficient documentation

## 2019-05-07 DIAGNOSIS — Z96653 Presence of artificial knee joint, bilateral: Secondary | ICD-10-CM | POA: Insufficient documentation

## 2019-05-07 DIAGNOSIS — E871 Hypo-osmolality and hyponatremia: Secondary | ICD-10-CM | POA: Insufficient documentation

## 2019-05-07 DIAGNOSIS — Z7982 Long term (current) use of aspirin: Secondary | ICD-10-CM | POA: Insufficient documentation

## 2019-05-07 DIAGNOSIS — I10 Essential (primary) hypertension: Secondary | ICD-10-CM | POA: Insufficient documentation

## 2019-05-07 DIAGNOSIS — Z794 Long term (current) use of insulin: Secondary | ICD-10-CM | POA: Insufficient documentation

## 2019-05-07 LAB — CBC WITH DIFFERENTIAL/PLATELET
Basophils Absolute: 0 10*3/uL (ref 0.0–0.1)
Basophils Relative: 0.2 % (ref 0.0–3.0)
Eosinophils Absolute: 0 10*3/uL (ref 0.0–0.7)
Eosinophils Relative: 0.3 % (ref 0.0–5.0)
HCT: 39.2 % (ref 39.0–52.0)
Hemoglobin: 13.1 g/dL (ref 13.0–17.0)
Lymphocytes Relative: 7.1 % — ABNORMAL LOW (ref 12.0–46.0)
Lymphs Abs: 0.4 10*3/uL — ABNORMAL LOW (ref 0.7–4.0)
MCHC: 33.3 g/dL (ref 30.0–36.0)
MCV: 86.1 fl (ref 78.0–100.0)
Monocytes Absolute: 0.6 10*3/uL (ref 0.1–1.0)
Monocytes Relative: 11.4 % (ref 3.0–12.0)
Neutro Abs: 4.5 10*3/uL (ref 1.4–7.7)
Neutrophils Relative %: 81 % — ABNORMAL HIGH (ref 43.0–77.0)
Platelets: 142 10*3/uL — ABNORMAL LOW (ref 150.0–400.0)
RBC: 4.55 Mil/uL (ref 4.22–5.81)
RDW: 12.8 % (ref 11.5–15.5)
WBC: 5.6 10*3/uL (ref 4.0–10.5)

## 2019-05-07 LAB — COMPREHENSIVE METABOLIC PANEL
ALT: 33 U/L (ref 0–53)
AST: 32 U/L (ref 0–37)
Albumin: 3.7 g/dL (ref 3.5–5.2)
Alkaline Phosphatase: 57 U/L (ref 39–117)
BUN: 25 mg/dL — ABNORMAL HIGH (ref 6–23)
CO2: 25 mEq/L (ref 19–32)
Calcium: 9.3 mg/dL (ref 8.4–10.5)
Chloride: 90 mEq/L — ABNORMAL LOW (ref 96–112)
Creatinine, Ser: 2.53 mg/dL — ABNORMAL HIGH (ref 0.40–1.50)
GFR: 31.06 mL/min — ABNORMAL LOW (ref >60.00)
Glucose, Bld: 414 mg/dL — ABNORMAL HIGH (ref 70–99)
Potassium: 4.2 mEq/L (ref 3.5–5.1)
Sodium: 126 mEq/L — ABNORMAL LOW (ref 135–145)
Total Bilirubin: 0.7 mg/dL (ref 0.2–1.2)
Total Protein: 7.8 g/dL (ref 6.0–8.3)

## 2019-05-07 LAB — URIC ACID: Uric Acid, Serum: 6.5 mg/dL (ref 4.0–7.8)

## 2019-05-07 LAB — BRAIN NATRIURETIC PEPTIDE: Pro B Natriuretic peptide (BNP): 33 pg/mL (ref 0.0–100.0)

## 2019-05-07 NOTE — ED Provider Notes (Signed)
[facility] DEPT Provider Note   CSN: [id number] Arrival date & time: 05/07/19  1645     History Chief Complaint  Patient presents with  . Abnormal Lab    David Lindsey is a 64 y.o. male.  64 year old male presents after being called due to a low sodium.  Patient has had 3 weeks of left lower extremity edema and swelling.  Has had a negative duplex Doppler.  On Friday was started on Lasix as well as Keflex.  Patient reseen again today which is 3 days later was found to have a decreased sodium as well as worsening kidney function.  He denies being short of breath.  States his edema to his left leg is actually gotten better.  Has no other complaints at this time.        Past Medical History:  Diagnosis Date  . ALCOHOLISM 12/22/2007  . ANEMIA, IRON DEFICIENCY 12/26/2007  . Arthritis    OA  . Blood transfusion without reported diagnosis    as a child  . DIABETES MELLITUS, TYPE II 11/10/2006  . ESOPHAGITIS, REFLUX 12/22/2007  . GERD (gastroesophageal reflux disease)   . HYPERCHOLESTEROLEMIA 12/22/2007  . HYPERTENSION 11/10/2006  . HYPOGONADISM 12/22/2007   Prob. due to ETOH  . HYPOKALEMIA 06/06/2008  . Leukopenia   . Neuropathy    Chronic Neuropathy BOTH FEET  . Pancytopenia 06/06/2008  . 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 Degenerative arthritis of left knee 11/18/2017  . Left shoulder pain 08/29/2017  . History of total right knee replacement (TKR) 03/25/2017  . Arthralgia 02/24/2017  . Edema 01/13/2017  . Wellness examination 01/30/2015  . Myalgia and myositis 01/28/2014  . Unspecified constipation 03/13/2013  . Screening for prostate cancer 01/18/2013  . Diabetes mellitus with renal manifestation ([place]) 01/18/2013  . Encounter for long-term (current) use of other medications 03/24/2012  . HYPOKALEMIA  06/06/2008  . Pancytopenia ([place]) 06/06/2008  . Hypogonadism, male 12/22/2007  . HYPERCHOLESTEROLEMIA 12/22/2007  . ALCOHOLISM 12/22/2007  . ESOPHAGITIS, REFLUX 12/22/2007  . Leg pain 12/22/2007  . Essential hypertension 11/10/2006  . Disorder resulting from impaired renal function 11/10/2006    Past Surgical History:  Procedure Laterality Date  . APPENDECTOMY  AGE 60 OR 10  . COLONOSCOPY  08-21-2004   [facility]- normal  . COLONSCOPY  2017  . ELECTROCARDIOGRAM  11/07/2006  . ESOPHAGOGASTRODUODENOSCOPY  08/20/2004  . EYE SURGERY Bilateral 2014   [facility] FOR CATARACTS  . REPLACEMENT TOTAL KNEE Right 2011  . TOTAL KNEE ARTHROPLASTY Left 02/20/2018   Procedure: LEFT TOTAL KNEE ARTHROPLASTY;  Surgeon: Frederik Pear, MD;  Location: [facility];  Service: Orthopedics;  Laterality: Left;  . UPPER GASTROINTESTINAL ENDOSCOPY         Family History  Problem Relation Age of Onset  . Cancer Mother        Colon Cancer  . Colon cancer Mother   . Colon polyps Brother   . Esophageal cancer Neg Hx   . Rectal cancer Neg Hx   . Stomach cancer Neg Hx     Social History   Tobacco Use  . Smoking status: Never Smoker  . Smokeless tobacco: Never Used  Substance Use Topics  . Alcohol use: No    Alcohol/week: 0.0 standard drinks    Comment: LAST USE 10 YRS AGO  . Drug use: No  Home Medications Prior to Admission medications   Medication Sig Start Date End Date Taking? Authorizing Provider  amLODipine (NORVASC) 10 MG tablet Take 1 tablet (10 mg total) by mouth daily. 11/21/18   Marrian Salvage, FNP  Apple Cider Vinegar 600 MG CAPS Take 600 mg by mouth daily.    [provider]  Ascorbic Acid (VITAMIN C) 1000 MG tablet Take 1,000 mg by mouth every evening.    [provider]  aspirin EC 81 MG tablet Take 1 tablet (81 mg total) by mouth 2 (two) times daily. 02/20/18   Leighton Parody, PA-C  Biotin w/ Vitamins C & E (HAIR SKIN & NAILS GUMMIES PO) Take 1 tablet by mouth every  evening.    [provider]  Carboxymethylcellul-Glycerin (LUBRICATING EYE DROPS OP) Place 1 drop into both eyes daily as needed (dry eyes).    [provider]  cephALEXin (KEFLEX) 500 MG capsule Take 1 capsule (500 mg total) by mouth 3 (three) times daily. 05/04/19   Marrian Salvage, FNP  clotrimazole-betamethasone (LOTRISONE) cream APPLY  CREAM TOPICALLY TO AFFECTED AREA THREE TIMES DAILY AS NEEDED FOR  RASH 02/19/19   Renato Shin, MD  Coenzyme Q10 (COQ-10) 200 MG CAPS Take 200 mg by mouth every evening.     [provider]  Cyanocobalamin (B-12) 5000 MCG CAPS Take 5,000 mcg by mouth every evening.     [provider]  dapagliflozin propanediol (FARXIGA) 10 MG TABS tablet Take 10 mg by mouth daily. 04/19/19   Renato Shin, MD  docusate sodium (COLACE) 100 MG capsule Take 200 mg by mouth every evening.    [provider]  EASY TOUCH PEN NEEDLES 31G X 8 MM MISC USE ONCE DAILY WITH VICTOZA 11/04/17   Renato Shin, MD  furosemide (LASIX) 20 MG tablet Take 1 per day as directed; may take a second dose if needed 05/04/19   Marrian Salvage, FNP  gabapentin (NEURONTIN) 300 MG capsule Take 1 capsule (300 mg total) by mouth 3 (three) times daily. 10/02/18   Marrian Salvage, FNP  glucose blood (ONE TOUCH ULTRA TEST) test strip Use as instructed check blood sugar two times daily     [provider]  Insulin Lispro Prot & Lispro (HUMALOG MIX 75/25 KWIKPEN) (75-25) 100 UNIT/ML Kwikpen Inject 28 Units into the skin daily with breakfast. 01/08/19   Renato Shin, MD  liraglutide (VICTOZA) 18 MG/3ML SOPN Inject 0.3 mLs (1.8 mg total) into the skin every morning. 04/19/19   Renato Shin, MD  lisinopril-hydrochlorothiazide (ZESTORETIC) 10-12.5 MG tablet TAKE ONE-HALF TABLET BY  MOUTH DAILY 04/16/19   Marrian Salvage, FNP  Menthol, Topical Analgesic, (STOPAIN ROLL-ON) 8 % LIQD Apply 1 application topically 3 (three) times daily as needed (for  knee pain.).    [provider]  Methylcellulose, Laxative, (HM FIBER) 500 MG TABS Take 500 mg by mouth 2 (two) times daily. Fiberwell Gummies    [provider]  metoprolol succinate (TOPROL-XL) 25 MG 24 hr tablet Take 0.5 tablets (12.5 mg total) by mouth at bedtime. 05/02/18   Marrian Salvage, FNP  Multiple Vitamin (MULTIVITAMIN WITH MINERALS) TABS tablet Take 1 tablet by mouth every evening.     [provider]  Omega-3-6-9 CAPS Take 1 capsule by mouth every evening. 1600mg     [provider]  omeprazole (PRILOSEC) 20 MG capsule Take 1 capsule (20 mg total) by mouth daily. 05/02/18   Marrian Salvage, FNP  rosuvastatin (CRESTOR) 5 MG  tablet Take 1 tablet (5 mg total) by mouth daily. Patient taking differently: Take 5 mg by mouth at bedtime.  08/29/17   Renato Shin, MD  sildenafil (VIAGRA) 100 MG tablet TAKE 1 TABLET BY MOUTH ONCE DAILY AS NEEDED 03/16/19   Marrian Salvage, FNP  traZODone (DESYREL) 100 MG tablet TAKE 1 TABLET BY MOUTH AT  BEDTIME 03/05/19   Marrian Salvage, FNP  trolamine salicylate (ASPERCREME) 10 % cream Apply 1 application topically as needed (knee pain).    [provider]  vitamin E 400 UNIT capsule Take 400 Units by mouth every evening.     [provider]    Allergies    Pioglitazone  Review of Systems   Review of Systems  All other systems reviewed and are negative.   Physical Exam Updated Vital Signs BP (!) 129/107 (BP Location: Right Arm)   Pulse 65   Temp 98.8 F (37.1 C) (Oral)   Resp 20   SpO2 97%   Physical Exam Vitals and nursing note reviewed.  Constitutional:      General: He is not in acute distress.    Appearance: Normal appearance. He is well-developed. He is not toxic-appearing.  HENT:     Head: Normocephalic and atraumatic.  Eyes:     General: Lids are normal.     Conjunctiva/sclera: Conjunctivae normal.     Pupils: Pupils are equal, round, and reactive to  light.  Neck:     Thyroid: No thyroid mass.     Trachea: No tracheal deviation.  Cardiovascular:     Rate and Rhythm: Normal rate and regular rhythm.     Heart sounds: Normal heart sounds. No murmur. No gallop.   Pulmonary:     Effort: Pulmonary effort is normal. No respiratory distress.     Breath sounds: Normal breath sounds. No stridor. No decreased breath sounds, wheezing, rhonchi or rales.  Abdominal:     General: Bowel sounds are normal. There is no distension.     Palpations: Abdomen is soft.     Tenderness: There is no abdominal tenderness. There is no rebound.  Musculoskeletal:        General: No tenderness. Normal range of motion.     Cervical back: Normal range of motion and neck supple.       Legs:  Skin:    General: Skin is warm and dry.     Findings: No abrasion or rash.  Neurological:     Mental Status: He is alert and oriented to person, place, and time.     GCS: GCS eye subscore is 4. GCS verbal subscore is 5. GCS motor subscore is 6.     Cranial Nerves: No cranial nerve deficit.     Sensory: No sensory deficit.  Psychiatric:        Speech: Speech normal.        Behavior: Behavior normal.     ED Results / Procedures / Treatments   Labs (all labs ordered are listed, but only abnormal results are displayed) Labs Reviewed - No data to display  EKG None  Radiology No results found.  Procedures Procedures (including critical care time)  Medications Ordered in ED Medications - No data to display  ED Course  I have reviewed the triage vital signs and the nursing notes.  Pertinent labs & imaging results that were available during my care of the patient were reviewed by me and considered in my medical decision making (see chart for details).  MDM Rules/Calculators/A&P                      Patient sodium and creatinine reviewed and are likely due to being given Lasix.  His pre-Lasix labs were at baseline.  Will instruct patient to stop using Lasix  and follow-up with Dr. Final Clinical Impression(s) / ED Diagnoses Final diagnoses:  None    Rx / DC Orders ED Discharge Orders    None       Lacretia Leigh, MD 05/07/19 1913

## 2019-05-07 NOTE — Progress Notes (Signed)
David Lindsey is a 64 y.o. male with the following history as recorded in Epic[employer]:  Patient Active Problem List   Diagnosis Date Noted  . 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 Degenerative arthritis of left knee 11/18/2017  . Left shoulder pain 08/29/2017  . History of total right knee replacement (TKR) 03/25/2017  . Arthralgia 02/24/2017  . Edema 01/13/2017  . Wellness examination 01/30/2015  . Myalgia and myositis 01/28/2014  . Unspecified constipation 03/13/2013  . Screening for prostate cancer 01/18/2013  . Diabetes mellitus with renal manifestation ([place]) 01/18/2013  . Encounter for long-term (current) use of other medications 03/24/2012  . HYPOKALEMIA 06/06/2008  . Pancytopenia ([place]) 06/06/2008  . Hypogonadism, male 12/22/2007  . HYPERCHOLESTEROLEMIA 12/22/2007  . ALCOHOLISM 12/22/2007  . ESOPHAGITIS, REFLUX 12/22/2007  . Leg pain 12/22/2007  . Essential hypertension 11/10/2006  . Disorder resulting from impaired renal function 11/10/2006    Current Outpatient Medications  Medication Sig Dispense Refill  . amLODipine (NORVASC) 10 MG tablet Take 1 tablet (10 mg total) by mouth daily. 90 tablet 1  . Apple Cider Vinegar 600 MG CAPS Take 600 mg by mouth daily.    . Ascorbic Acid (VITAMIN C) 1000 MG tablet Take 1,000 mg by mouth every evening.    Marland Kitchen aspirin EC 81 MG tablet Take 1 tablet (81 mg total) by mouth 2 (two) times daily. 60 tablet 0  . Biotin w/ Vitamins C & E (HAIR SKIN & NAILS GUMMIES PO) Take 1 tablet by mouth every evening.    . Carboxymethylcellul-Glycerin (LUBRICATING EYE DROPS OP) Place 1 drop into both eyes daily as needed (dry eyes).    . cephALEXin (KEFLEX) 500 MG capsule Take 1 capsule (500 mg total) by mouth 3 (three) times daily. 21 capsule 0  . clotrimazole-betamethasone (LOTRISONE) cream APPLY  CREAM TOPICALLY TO AFFECTED AREA THREE TIMES DAILY AS NEEDED FOR  RASH 45 g 0  . Coenzyme Q10 (COQ-10) 200 MG CAPS Take 200 mg by mouth every evening.      . Cyanocobalamin (B-12) 5000 MCG CAPS Take 5,000 mcg by mouth every evening.     . dapagliflozin propanediol (FARXIGA) 10 MG TABS tablet Take 10 mg by mouth daily. 90 tablet 3  . docusate sodium (COLACE) 100 MG capsule Take 200 mg by mouth every evening.    Marland Kitchen EASY TOUCH PEN NEEDLES 31G X 8 MM MISC USE ONCE DAILY WITH VICTOZA 100 each 11  . furosemide (LASIX) 20 MG tablet Take 1 per day as directed; may take a second dose if needed 30 tablet 0  . gabapentin (NEURONTIN) 300 MG capsule Take 1 capsule (300 mg total) by mouth 3 (three) times daily. 270 capsule 2  . glucose blood (ONE TOUCH ULTRA TEST) test strip Use as instructed check blood sugar two times daily     . Insulin Lispro Prot & Lispro (HUMALOG MIX 75/25 KWIKPEN) (75-25) 100 UNIT/ML Kwikpen Inject 28 Units into the skin daily with breakfast. 15 mL 11  . liraglutide (VICTOZA) 18 MG/3ML SOPN Inject 0.3 mLs (1.8 mg total) into the skin every morning. 9 mL 3  . lisinopril-hydrochlorothiazide (ZESTORETIC) 10-12.5 MG tablet TAKE ONE-HALF TABLET BY  MOUTH DAILY 45 tablet 3  . Menthol, Topical Analgesic, (STOPAIN ROLL-ON) 8 % LIQD Apply 1 application topically 3 (three) times daily as needed (for knee pain.).    Marland Kitchen Methylcellulose, Laxative, (HM FIBER) 500 MG TABS Take 500 mg by mouth 2 (two) times daily. Fiberwell Gummies    . metoprolol  succinate (TOPROL-XL) 25 MG 24 hr tablet Take 0.5 tablets (12.5 mg total) by mouth at bedtime. 45 tablet 3  . Multiple Vitamin (MULTIVITAMIN WITH MINERALS) TABS tablet Take 1 tablet by mouth every evening.     . Omega-3-6-9 CAPS Take 1 capsule by mouth every evening. '1600mg'$     . omeprazole (PRILOSEC) 20 MG capsule Take 1 capsule (20 mg total) by mouth daily. 90 capsule 3  . rosuvastatin (CRESTOR) 5 MG tablet Take 1 tablet (5 mg total) by mouth daily. (Patient taking differently: Take 5 mg by mouth at bedtime. ) 90 tablet 3  . sildenafil (VIAGRA) 100 MG tablet TAKE 1 TABLET BY MOUTH ONCE DAILY AS NEEDED 6 tablet 2   . traZODone (DESYREL) 100 MG tablet TAKE 1 TABLET BY MOUTH AT  BEDTIME 90 tablet 3  . trolamine salicylate (ASPERCREME) 10 % cream Apply 1 application topically as needed (knee pain).    . vitamin E 400 UNIT capsule Take 400 Units by mouth every evening.      No current facility-administered medications for this visit.    Allergies: Pioglitazone  Past Medical History:  Diagnosis Date  . ALCOHOLISM 12/22/2007  . ANEMIA, IRON DEFICIENCY 12/26/2007  . Arthritis    OA  . Blood transfusion without reported diagnosis    as a child  . DIABETES MELLITUS, TYPE II 11/10/2006  . ESOPHAGITIS, REFLUX 12/22/2007  . GERD (gastroesophageal reflux disease)   . HYPERCHOLESTEROLEMIA 12/22/2007  . HYPERTENSION 11/10/2006  . HYPOGONADISM 12/22/2007   Prob. due to ETOH  . HYPOKALEMIA 06/06/2008  . Leukopenia   . Neuropathy    Chronic Neuropathy BOTH FEET  . Pancytopenia 06/06/2008  . RENAL INSUFFICIENCY 11/10/2006   SEES [place] [facility] STAGE 2 CKD    Past Surgical History:  Procedure Laterality Date  . APPENDECTOMY  AGE 58 OR 10  . COLONOSCOPY  08-21-2004   [facility]  . COLONSCOPY  2017  . ELECTROCARDIOGRAM  11/07/2006  . ESOPHAGOGASTRODUODENOSCOPY  08/20/2004  . EYE SURGERY Bilateral 2014   [facility] FOR CATARACTS  . REPLACEMENT TOTAL KNEE Right 2011  . TOTAL KNEE ARTHROPLASTY Left 02/20/2018   Procedure: LEFT TOTAL KNEE ARTHROPLASTY;  Surgeon: Frederik Pear, MD;  Location: [facility];  Service: Orthopedics;  Laterality: Left;  . UPPER GASTROINTESTINAL ENDOSCOPY      Family History  Problem Relation Age of Onset  . Cancer Mother        Colon Cancer  . Colon cancer Mother   . Colon polyps Brother   . Esophageal cancer Neg Hx   . Rectal cancer Neg Hx   . Stomach cancer Neg Hx     Social History   Tobacco Use  . Smoking status: Never Smoker  . Smokeless tobacco: Never Used  Substance Use Topics  . Alcohol use: No    Alcohol/week: 0.0 standard drinks    Comment: LAST USE 10 YRS AGO     Subjective:  3 day follow-up on left lower extremity redness/ swelling; on Friday, DVT was ruled out; patient was started on Lasix and keflex; notes that his lower leg is feeling much better but his left knee is painful and feels swollen; per patient, he has no prior history of gout;  Patient has lost 4 pounds today compared to Friday; no chest pain or shortness of breath;    Objective:  Vitals:   05/07/19 1124  BP: 130/80  Pulse: 91  Temp: 99.3 F (37.4 C)  TempSrc: Oral  SpO2: 98%  Weight: 291  lb 12.8 oz (132.4 kg)  Height: '5\' 11"'$  (1.803 m)    General: Well developed, well nourished, in no acute distress  Skin : Warm and dry.  Head: Normocephalic and atraumatic  Lungs: Respirations unlabored; clear to auscultation bilaterally without wheeze, rales, rhonchi  CVS exam: normal rate and regular rhythm.  Musculoskeletal: No deformities; no active joint inflammation; mild erythema noted over left ankle- marked improvement compared to Friday Extremities: 1+ edema, cyanosis, clubbing  Vessels: Symmetric bilaterally  Neurologic: Alert and oriented; speech intact; face symmetrical; moves all extremities well; CNII-XII intact without focal deficit   Assessment:  1. Localized swelling of left lower extremity   2. Pain and swelling of left knee     Plan:  Per patient, majority of his symptoms are in his left knee now; ? Etiology- ? Gout; will update left knee x-ray and CXR today; will check labs today as well; follow up to be determined. Patient is scheduled for his COVID vaccine today so do not want to use oral steroids at this time.   This visit occurred during the SARS-CoV-2 public health emergency.  Safety protocols were in place, including screening questions prior to the visit, additional usage of staff PPE, and extensive cleaning of exam room while observing appropriate contact time as indicated for disinfecting solutions.     No follow-ups on file.  Orders Placed This Encounter   Procedures  . DG Knee Complete 4 Views Left    Standing Status:   Future    Standing Expiration Date:   07/04/2020    Order Specific Question:   Reason for Exam (SYMPTOM  OR DIAGNOSIS REQUIRED)    Answer:   left knee pain    Order Specific Question:   Preferred imaging location?    Answer:   [facility]    Order Specific Question:   Radiology Contrast Protocol - do NOT remove file path    Answer:   \\[email or web address]  . DG Chest 2 View    Order Specific Question:   Reason for Exam (SYMPTOM  OR DIAGNOSIS REQUIRED)    Answer:   pedal edema    Order Specific Question:   Preferred imaging location?    Answer:   [facility]    Order Specific Question:   Radiology Contrast Protocol - do NOT remove file path    Answer:   \\[email or web address]  . Comp Met (CMET)  . CBC w/Diff  . Uric acid  . B Nat Peptide    Requested Prescriptions    No prescriptions requested or ordered in this encounter

## 2019-05-07 NOTE — ED Triage Notes (Signed)
Pt states that he has been at PCP for knee pain. Pt states that he had labs drawn there, which showed low sodium. Pt states he has been weak.

## 2019-05-07 NOTE — Discharge Instructions (Addendum)
Stop taking the Lasix and call your doctor tomorrow to schedule follow-up labs

## 2019-05-08 ENCOUNTER — Encounter: Payer: Self-pay | Admitting: Family Medicine

## 2019-05-08 ENCOUNTER — Ambulatory Visit (INDEPENDENT_AMBULATORY_CARE_PROVIDER_SITE_OTHER): Payer: PRIVATE HEALTH INSURANCE | Admitting: Family Medicine

## 2019-05-08 ENCOUNTER — Telehealth: Payer: Self-pay

## 2019-05-08 ENCOUNTER — Other Ambulatory Visit: Payer: Medicare Other

## 2019-05-08 ENCOUNTER — Ambulatory Visit: Payer: Self-pay

## 2019-05-08 VITALS — BP 122/74 | HR 116 | Ht 71.0 in | Wt 291.0 lb

## 2019-05-08 DIAGNOSIS — M00862 Arthritis due to other bacteria, left knee: Secondary | ICD-10-CM

## 2019-05-08 DIAGNOSIS — M25462 Effusion, left knee: Secondary | ICD-10-CM

## 2019-05-08 DIAGNOSIS — M25562 Pain in left knee: Secondary | ICD-10-CM

## 2019-05-08 MED ORDER — DOXYCYCLINE HYCLATE 100 MG PO TABS: 100.0000 mg | ORAL_TABLET | Freq: Two times a day (BID) | ORAL | 0 refills | Status: AC

## 2019-05-08 MED ORDER — HYDROCODONE-ACETAMINOPHEN 5-325 MG PO TABS: 1.0000 | ORAL_TABLET | Freq: Four times a day (QID) | ORAL | 0 refills | Status: DC | PRN

## 2019-05-08 NOTE — Patient Instructions (Addendum)
Thank you for coming in today. Schedule with me for Thursday afternoon.  Continue antibiotics that were prescribed last week.  Add doxycycline antibiotics.   Let me know if you are worsening.  Use hydrocodone sparingly.    Knee Effusion  Knee effusion means that you have extra fluid in your knee. This can cause pain. Your knee may be more difficult to bend and move. What are the causes? Common causes are:  Arthritis.  Infection.  Injury.  Autoimmune disease. This means that your body's defense system (immune system) mistakenly attacks healthy body tissues. Follow these instructions at home: Medicines  Take medicines only as told by your doctor.  Do not drive or use heavy machinery while taking prescription pain medicine.  If you are taking prescription pain medicine, take actions to help prevent or treat trouble pooping (constipation). Your doctor may recommend that you: ? Drink enough fluid to keep your pee (urine) pale yellow. ? Eat foods that are high in fiber. These include fresh fruits and vegetables, whole grains, and beans. ? Avoid eating fatty or sweet foods. ? Take a medicine for constipation. If you have a brace:  Wear the brace as told by your doctor. Remove it only as told by your doctor.  Loosen the brace if your toes tingle, become numb, or turn cold and blue.  Keep the brace clean.  If the brace is not waterproof: ? Do not let it get wet. ? Cover it with a watertight covering when you take a bath or a shower. Managing pain, stiffness, and swelling   If told, apply ice to the swollen area: ? If you have a removable brace, remove it as told by your doctor. ? Put ice in a plastic bag. ? Place a towel between your skin and the bag. ? Leave the ice on for 20 minutes, 2-3 times per day.  Keep your knee raised (elevated) when you are sitting or lying down. General instructions  Do not use any products that contain nicotine or tobacco, such as  cigarettes and e-cigarettes. These can delay healing. If you need help quitting, ask your doctor.  Use crutches as told by your doctor.  Do exercises as told by your doctor.  Rest as told by your doctor.  Keep all follow-up visits as told by your doctor. This is important. Contact a doctor if you:  Continue to have pain in your knee. Get help right away if you:  Have swelling or redness of your knee that gets worse or does not get better.  Have serious pain in your knee.  Have a fever. Summary  Knee effusion is when you have extra fluid in your knee. This causes pain and swelling and makes it hard to bend and move your knee.  Take medicines only as told by your doctor.  If you have a brace, wear the brace as told by your doctor. This information is not intended to replace advice given to you by your health care provider. Make sure you discuss any questions you have with your health care provider. Document Revised: 04/11/2017 Document Reviewed: 04/10/2017 Elsevier Patient Education  2020 [employer].

## 2019-05-08 NOTE — Telephone Encounter (Signed)
Company: [employer]  Document: Medical records request  Forwarded above request to HIM. Document and fax confirmation have been placed in the faxed file for future reference.

## 2019-05-08 NOTE — Progress Notes (Signed)
Subjective:    CC: L knee pain and swelling  I, Molly Weber, LAT, ATC, am serving as scribe for Dr. Lynne Leader.  HPI: Pt is a 64 y/o male presenting w/ c/o L knee pain and swelling for approximately one week.  He has most recently been seen over the past week for L lower leg pain and swelling and has been worked up for a DVT which was negative.  He was initially placed on Lasix and Keflex but since has been taken off of the Lasix due to an issue w/ hyponatremia.  He has a hx of a L TKA on 02/20/18.  His most recent L knee XR was on 05/07/19.  He currently rates his pain at a 8/10 and describes his pain as aching.  Pt is ambulating w/ a walker.  Radiating pain: No Knee swelling: Yes Numbness/tingling: No Mechanical knee symptoms: No Aggravating factors: Walking; Rotation while weight bearing Treatments tried: Aspercream  Pertinent review of Systems: No current fevers or chills  Relevant historical information: History of diabetes and recent history of AKI.  History of left total knee replacement.   Objective:    Vitals:   05/08/19 1402  BP: 122/74  Pulse: (!) 116  SpO2: 94%   General: Well Developed, well nourished, and in no acute distress.   MSK:  Left knee: Large effusion no erythema of skin overlying knee.  No induration.  Range of motion 0-100 degrees. Not particularly tender to palpation. Intact strength.   Stable ligamentous exam however some guarding with ligament exam testing.  Left lower leg with edema and mild erythema overlying lower leg and foot.  No break in skin with no induration.  No fluctuance.   Lab and Radiology Results Results for orders placed or performed in visit on 05/07/19 (from the past 72 hour(s))  Comp Met (CMET)     Status: Abnormal   Collection Time: 05/07/19 12:09 PM  Result Value Ref Range   Sodium 126 (L) 135 - 145 mEq/L   Potassium 4.2 3.5 - 5.1 mEq/L   Chloride 90 (L) 96 - 112 mEq/L   CO2 25 19 - 32 mEq/L   Glucose, Bld 414  (H) 70 - 99 mg/dL   BUN 25 (H) 6 - 23 mg/dL   Creatinine, Ser 2.53 (H) 0.40 - 1.50 mg/dL   Total Bilirubin 0.7 0.2 - 1.2 mg/dL   Alkaline Phosphatase 57 39 - 117 U/L   AST 32 0 - 37 U/L   ALT 33 0 - 53 U/L   Total Protein 7.8 6.0 - 8.3 g/dL   Albumin 3.7 3.5 - 5.2 g/dL   GFR 31.06 (L) >60.00 mL/min   Calcium 9.3 8.4 - 10.5 mg/dL  CBC w/Diff     Status: Abnormal   Collection Time: 05/07/19 12:09 PM  Result Value Ref Range   WBC 5.6 4.0 - 10.5 K/uL   RBC 4.55 4.22 - 5.81 Mil/uL   Hemoglobin 13.1 13.0 - 17.0 g/dL   HCT 39.2 39.0 - 52.0 %   MCV 86.1 78.0 - 100.0 fl   MCHC 33.3 30.0 - 36.0 g/dL   RDW 12.8 11.5 - 15.5 %   Platelets 142.0 (L) 150.0 - 400.0 K/uL   Neutrophils Relative % 81.0 (H) 43.0 - 77.0 %   Lymphocytes Relative 7.1 Repeated and verified X2. (L) 12.0 - 46.0 %   Monocytes Relative 11.4 3.0 - 12.0 %   Eosinophils Relative 0.3 0.0 - 5.0 %   Basophils Relative  0.2 0.0 - 3.0 %   Neutro Abs 4.5 1.4 - 7.7 K/uL   Lymphs Abs 0.4 (L) 0.7 - 4.0 K/uL   Monocytes Absolute 0.6 0.1 - 1.0 K/uL   Eosinophils Absolute 0.0 0.0 - 0.7 K/uL   Basophils Absolute 0.0 0.0 - 0.1 K/uL  Uric acid     Status: None   Collection Time: 05/07/19 12:09 PM  Result Value Ref Range   Uric Acid, Serum 6.5 4.0 - 7.8 mg/dL  B Nat Peptide     Status: None   Collection Time: 05/07/19 12:09 PM  Result Value Ref Range   Pro B Natriuretic peptide (BNP) 33.0 0.0 - 100.0 pg/mL   DG Chest 2 View  Result Date: 05/07/2019 CLINICAL DATA:  Pedal edema. EXAM: CHEST - 2 VIEW COMPARISON:  November 22, 2017 FINDINGS: The heart size and mediastinal contours are within normal limits. Both lungs are clear. Degenerative changes seen throughout the thoracic spine. IMPRESSION: No active cardiopulmonary disease. Electronically Signed   By: Virgina Norfolk M.D.   On: 05/07/2019 20:25   DG Knee Complete 4 Views Left  Result Date: 05/07/2019 CLINICAL DATA:  Left knee pain. EXAM: LEFT KNEE - COMPLETE 4+ VIEW COMPARISON:   March 09, 2017 FINDINGS: A left knee replacement is seen without evidence of surrounding lucency to suggest the presence of hardware loosening or infection. There is no evidence of acute fracture or dislocation. A large joint effusion is seen. There is mild vascular calcification. IMPRESSION: 1. Intact left knee replacement. 2. Large joint effusion. Electronically Signed   By: Virgina Norfolk M.D.   On: 05/07/2019 20:27   I, Lynne Leader, personally (independently) visualized and performed the interpretation of the images attached in this note.   Limited musculoskeletal ultrasound left knee: Large effusion.   Intact quad tendon and patella tendon. No Baker cyst. Intact normal-appearing bony and hardware structures. Skin and soft tissue overlying knee without hypoechoic marbling indicating no severe cellulitis Impression: Large joint effusion.  Procedure: Real-time Ultrasound Guided Injection and aspiration of left knee effusion Device: Philips Affiniti 50G Images permanently stored and available for review in the ultrasound unit. Verbal informed consent obtained.  Discussed risks and benefits of procedure. Warned about infection bleeding damage to structures skin hypopigmentation and fat atrophy among others. Patient expresses understanding and agreement Time-out conducted.   Noted no overlying erythema, induration, or other signs of local infection.   Skin prepped in a sterile fashion.   Local anesthesia: Topical Ethyl chloride.   With sterile technique and under real time ultrasound guidance:  5 mL of lidocaine injected subcutaneously and into the knee joint easily.   Skin was again sterilized with rubbing alcohol and an 18-gauge needle was used to access the effusion and joint capsule. 120 mL of cloudy blood-tinged fluid aspirated. Completed without difficulty   Pain immediately resolved suggesting accurate placement of the medication.   Advised to call if fevers/chills, erythema,  induration, drainage, or persistent bleeding.   Images permanently stored and available for review in the ultrasound unit.  Impression: Technically successful ultrasound guided injection.       Impression and Recommendations:    Assessment and Plan: 64 y.o. male with left knee effusion and pain in the setting of left lower extremity swelling thought to be due to cellulitis.  Patient has had extensive work-up already including x-rays duplex ultrasound that have ruled out fracture severe hardware change or DVT.  Unfortunately he is already been treated with antibiotics empirically for presumed cellulitis.  This is helped some which is reassuring.  However because of the existing Keflex antibiotic use accuracy of the culture of fluid aspirate will be lessened.  However we'll proceed with cell count differential Gram stain and culture of the fluid today to rule out and evaluate for septic joint effusion.  We'll broaden antibiotic coverage to include doxycycline and recheck in 2 days.  Limited hydrocodone for severe pain.  Precautions reviewed. Severe problem.  PDMP reviewed during this encounter. Orders Placed This Encounter  Procedures  . Anaerobic and Aerobic Culture    Source left knee    Standing Status:   Future    Standing Expiration Date:   05/07/2020  . Gram stain    Standing Status:   Future    Standing Expiration Date:   05/07/2020  . [place] LIMITED JOINT SPACE STRUCTURES LOW LEFT(NO LINKED CHARGES)    Order Specific Question:   Reason for Exam (SYMPTOM  OR DIAGNOSIS REQUIRED)    Answer:   L knee pain    Order Specific Question:   Preferred imaging location?    Answer:   [facility]  . Synovial cell count + diff, w/ crystals    Left knee   Meds ordered this encounter  Medications  . HYDROcodone-acetaminophen (NORCO/VICODIN) 5-325 MG tablet    Sig: Take 1 tablet by mouth every 6 (six) hours as needed.    Dispense:  7 tablet    Refill:  0  .  doxycycline (VIBRA-TABS) 100 MG tablet    Sig: Take 1 tablet (100 mg total) by mouth 2 (two) times daily for 7 days.    Dispense:  14 tablet    Refill:  0    Discussed warning signs or symptoms. Please see discharge instructions. Patient expresses understanding.   The above documentation has been reviewed and is accurate and complete Lynne Leader

## 2019-05-09 ENCOUNTER — Other Ambulatory Visit: Payer: Self-pay | Admitting: Family

## 2019-05-09 DIAGNOSIS — E871 Hypo-osmolality and hyponatremia: Secondary | ICD-10-CM

## 2019-05-10 ENCOUNTER — Encounter: Payer: Self-pay | Admitting: Family Medicine

## 2019-05-10 ENCOUNTER — Other Ambulatory Visit: Payer: Medicare Other

## 2019-05-10 ENCOUNTER — Other Ambulatory Visit (INDEPENDENT_AMBULATORY_CARE_PROVIDER_SITE_OTHER): Payer: Medicare Other

## 2019-05-10 ENCOUNTER — Other Ambulatory Visit: Payer: Self-pay

## 2019-05-10 ENCOUNTER — Ambulatory Visit (INDEPENDENT_AMBULATORY_CARE_PROVIDER_SITE_OTHER): Payer: PRIVATE HEALTH INSURANCE | Admitting: Family Medicine

## 2019-05-10 VITALS — BP 128/68 | HR 75 | Ht 71.0 in | Wt 291.0 lb

## 2019-05-10 DIAGNOSIS — E871 Hypo-osmolality and hyponatremia: Secondary | ICD-10-CM

## 2019-05-10 DIAGNOSIS — M25462 Effusion, left knee: Secondary | ICD-10-CM | POA: Diagnosis not present

## 2019-05-10 LAB — COMPREHENSIVE METABOLIC PANEL
ALT: 57 U/L — ABNORMAL HIGH (ref 0–53)
AST: 32 U/L (ref 0–37)
Albumin: 3.7 g/dL (ref 3.5–5.2)
Alkaline Phosphatase: 61 U/L (ref 39–117)
BUN: 32 mg/dL — ABNORMAL HIGH (ref 6–23)
CO2: 23 mEq/L (ref 19–32)
Calcium: 9.7 mg/dL (ref 8.4–10.5)
Chloride: 95 mEq/L — ABNORMAL LOW (ref 96–112)
Creatinine, Ser: 2.42 mg/dL — ABNORMAL HIGH (ref 0.40–1.50)
GFR: 32.69 mL/min — ABNORMAL LOW (ref >60.00)
Glucose, Bld: 318 mg/dL — ABNORMAL HIGH (ref 70–99)
Potassium: 3.7 mEq/L (ref 3.5–5.1)
Sodium: 130 mEq/L — ABNORMAL LOW (ref 135–145)
Total Bilirubin: 0.5 mg/dL (ref 0.2–1.2)
Total Protein: 7.9 g/dL (ref 6.0–8.3)

## 2019-05-10 MED ORDER — CLINDAMYCIN HCL 300 MG PO CAPS: 300.0000 mg | ORAL_CAPSULE | Freq: Three times a day (TID) | ORAL | 0 refills | Status: DC

## 2019-05-10 NOTE — Patient Instructions (Signed)
Thank you for coming in today. STOP keflex antibiotic prescribed  Continue Doxycycline.  Add clindamycin antibiotic three x daily.  Based on results of labs I will order next test or potentially do Injection.

## 2019-05-10 NOTE — Progress Notes (Signed)
   I, Wendy Poet, LAT, ATC, am serving as scribe for Dr. Lynne Leader.  Kalino Bohner is a 64 y.o. male who presents to [facility] at [facility] today for f/u of L knee pain and swelling.  Pt was last seen on 05/08/19 and had his L knee aspirated.  He was placed on doxycycline and oxycodone-acetaminophen 5-325.  Since his last visit, pt reports no change in his pain although the swelling has improved.  He has been taking the Keflex prescribed by PCP and doxycycline prescribed by me on the 26 and the oxycodone-acetaminophen.  He notes that since last Friday the 22nd the redness in his left lower leg has improved.  He notes he still has swelling in his lower leg and swelling in his knee and pain in his knee.  No fevers or chills.   Pertinent review of systems: No chest pain palpitation shortness of breath.  Relevant historical information: Unchanged from visit 26.   Exam:  BP 128/68 (BP Location: Left Arm, Patient Position: Sitting, Cuff Size: Large)   Pulse 75   Ht 5\' 11"  (1.803 m)   Wt 291 lb (132 kg)   SpO2 95%   BMI 40.59 kg/m  General: Well Developed, well nourished, and in no acute distress.   MSK: Moderate left knee effusion without skin erythema overlying knee. Left lower leg 2+ pitting edema mild erythema at the foot and distal mid shin. Normal knee motion. Mildly tender to palpation.    Lab and Radiology Results Cell count differential crystal analysis Gram stain and culture are all still pending.  No results available at the time of the visit.    Assessment and Plan: 64 y.o. male with left knee effusion and pain in the setting of left lower leg swelling.  Still concerning for septic arthritis based on appearance of fluid aspirated however the tests are still pending.  It is a bit unusual for the cell count differential to take longer than 12 to 24 hours.  I called the lab just prior to the patient's arrival and the test is still in process.  He has had  some improvement in leg erythema over the last few days.  He likely is responding to some of the antibiotics.  Plan to broaden antibiotic coverage.  Continue doxycycline add clindamycin.  Okay to discontinue Keflex.  Assuming culture comes back negative and no concern for infection will obtain three-phase bone scan to evaluate for hardware loosening as cause of knee effusion.   PDMP not reviewed this encounter. No orders of the defined types were placed in this encounter.  Meds ordered this encounter  Medications  . clindamycin (CLEOCIN) 300 MG capsule    Sig: Take 1 capsule (300 mg total) by mouth 3 (three) times daily.    Dispense:  21 capsule    Refill:  0     Discussed warning signs or symptoms. Please see discharge instructions. Patient expresses understanding.   The above documentation has been reviewed and is accurate and complete Lynne Leader

## 2019-05-11 ENCOUNTER — Other Ambulatory Visit: Payer: Medicare Other

## 2019-05-14 ENCOUNTER — Encounter: Payer: Self-pay | Admitting: Family Medicine

## 2019-05-14 ENCOUNTER — Telehealth: Payer: Self-pay | Admitting: Family Medicine

## 2019-05-14 LAB — TEST AUTHORIZATION 2

## 2019-05-14 LAB — TEST AUTHORIZATION

## 2019-05-14 LAB — CELL COUNT + DIFF, W/O CRYST-SYNVL FLD
Basophils, %: 0 %
Eosinophils-Synovial: 0 % (ref 0–2)
Lymphocytes-Synovial Fld: 10 % (ref 0–74)
Monocyte/Macrophage: 4 % (ref 0–69)
Neutrophil, Synovial: 86 % — ABNORMAL HIGH (ref 0–24)
Synoviocytes, %: 0 % (ref 0–15)
WBC, Synovial: 82250 cells/uL — ABNORMAL HIGH (ref <150)

## 2019-05-14 LAB — ANAEROBIC AND AEROBIC CULTURE

## 2019-05-14 MED ORDER — HYDROCODONE-ACETAMINOPHEN 5-325 MG PO TABS: 1.0000 | ORAL_TABLET | Freq: Four times a day (QID) | ORAL | 0 refills | Status: DC | PRN

## 2019-05-14 NOTE — Progress Notes (Signed)
Culture is growing a bacteria called Serratia marcescens.  This is a bit unusual to find an knee joints.  However I do think the result is valid.  I have consulted infectious disease and you should hear soon about that.  Additionally I am going to contact your orthopedic surgeons office as well to keep them updated.

## 2019-05-14 NOTE — Telephone Encounter (Signed)
I called [place] today and gave him the results of his knee culture.  I spoke with Dr. Mayer Camel just prior to calling the patient.  Plan for visit with Dr. Mayer Camel tomorrow with plan for washout surgery on Wednesday. Continue antibiotics.  Patient will call and schedule appoint with Dr. Mayer Camel for tomorrow and let me know if there is any problems.  Hydrocodone refilled.

## 2019-05-15 ENCOUNTER — Other Ambulatory Visit: Payer: Self-pay

## 2019-05-15 ENCOUNTER — Encounter (HOSPITAL_COMMUNITY)
Admission: RE | Admit: 2019-05-15 | Discharge: 2019-05-15 | Disposition: A | Discharge status: Home / Self Care | Payer: Medicare Other | Source: Ambulatory Visit | Attending: Orthopedic Surgery | Admitting: Orthopedic Surgery

## 2019-05-15 ENCOUNTER — Encounter (HOSPITAL_COMMUNITY): Payer: Self-pay

## 2019-05-15 ENCOUNTER — Encounter (HOSPITAL_COMMUNITY)
Admission: RE | Admit: 2019-05-15 | Discharge: 2019-05-15 | Disposition: A | Discharge status: Home / Self Care | Payer: Medicare Other | Source: Ambulatory Visit | Attending: Gynecologic Oncology | Admitting: Gynecologic Oncology

## 2019-05-15 ENCOUNTER — Other Ambulatory Visit (HOSPITAL_COMMUNITY)
Admission: RE | Admit: 2019-05-15 | Discharge: 2019-05-15 | Disposition: A | Discharge status: Home / Self Care | Payer: Medicare Other | Source: Ambulatory Visit | Attending: Orthopedic Surgery | Admitting: Orthopedic Surgery

## 2019-05-15 ENCOUNTER — Other Ambulatory Visit: Payer: Self-pay | Admitting: Orthopedic Surgery

## 2019-05-15 ENCOUNTER — Other Ambulatory Visit (HOSPITAL_COMMUNITY): Payer: Self-pay | Admitting: *Deleted

## 2019-05-15 DIAGNOSIS — Z96652 Presence of left artificial knee joint: Secondary | ICD-10-CM | POA: Diagnosis not present

## 2019-05-15 DIAGNOSIS — Z20822 Contact with and (suspected) exposure to covid-19: Secondary | ICD-10-CM | POA: Diagnosis not present

## 2019-05-15 DIAGNOSIS — Z01810 Encounter for preprocedural cardiovascular examination: Secondary | ICD-10-CM | POA: Insufficient documentation

## 2019-05-15 DIAGNOSIS — Z96651 Presence of right artificial knee joint: Secondary | ICD-10-CM | POA: Diagnosis not present

## 2019-05-15 DIAGNOSIS — M25562 Pain in left knee: Secondary | ICD-10-CM | POA: Diagnosis not present

## 2019-05-15 DIAGNOSIS — T8484XA Pain due to internal orthopedic prosthetic devices, implants and grafts, initial encounter: Secondary | ICD-10-CM | POA: Diagnosis not present

## 2019-05-15 DIAGNOSIS — M25462 Effusion, left knee: Secondary | ICD-10-CM | POA: Diagnosis not present

## 2019-05-15 DIAGNOSIS — Z01812 Encounter for preprocedural laboratory examination: Secondary | ICD-10-CM | POA: Diagnosis not present

## 2019-05-15 DIAGNOSIS — T8459XA Infection and inflammatory reaction due to other internal joint prosthesis, initial encounter: Secondary | ICD-10-CM

## 2019-05-15 LAB — SEDIMENTATION RATE: Sed Rate: 102 mm/hr — ABNORMAL HIGH (ref 0–16)

## 2019-05-15 LAB — CBC WITH DIFFERENTIAL/PLATELET
Abs Immature Granulocytes: 0.07 10*3/uL (ref 0.00–0.07)
Basophils Absolute: 0 10*3/uL (ref 0.0–0.1)
Basophils Relative: 0 %
Eosinophils Absolute: 0.1 10*3/uL (ref 0.0–0.5)
Eosinophils Relative: 1 %
HCT: 38.3 % — ABNORMAL LOW (ref 39.0–52.0)
Hemoglobin: 12.3 g/dL — ABNORMAL LOW (ref 13.0–17.0)
Immature Granulocytes: 1 %
Lymphocytes Relative: 17 %
Lymphs Abs: 1.2 10*3/uL (ref 0.7–4.0)
MCH: 28.1 pg (ref 26.0–34.0)
MCHC: 32.1 g/dL (ref 30.0–36.0)
MCV: 87.4 fL (ref 80.0–100.0)
Monocytes Absolute: 0.7 10*3/uL (ref 0.1–1.0)
Monocytes Relative: 9 %
Neutro Abs: 5.2 10*3/uL (ref 1.7–7.7)
Neutrophils Relative %: 72 %
Platelets: 494 10*3/uL — ABNORMAL HIGH (ref 150–400)
RBC: 4.38 MIL/uL (ref 4.22–5.81)
RDW: 12.6 % (ref 11.5–15.5)
WBC: 7.3 10*3/uL (ref 4.0–10.5)
nRBC: 0 % (ref 0.0–0.2)

## 2019-05-15 LAB — SARS CORONAVIRUS 2 (TAT 6-24 HRS): SARS Coronavirus 2: NEGATIVE

## 2019-05-15 LAB — URINALYSIS, ROUTINE W REFLEX MICROSCOPIC
Bilirubin Urine: NEGATIVE
Glucose, UA: 50 mg/dL — AB
Hgb urine dipstick: NEGATIVE
Ketones, ur: NEGATIVE mg/dL
Leukocytes,Ua: NEGATIVE
Nitrite: NEGATIVE
Protein, ur: NEGATIVE mg/dL
Specific Gravity, Urine: 1.016 (ref 1.005–1.030)
pH: 5 (ref 5.0–8.0)

## 2019-05-15 LAB — BASIC METABOLIC PANEL
Anion gap: 11 (ref 5–15)
BUN: 24 mg/dL — ABNORMAL HIGH (ref 8–23)
CO2: 26 mmol/L (ref 22–32)
Calcium: 9.8 mg/dL (ref 8.9–10.3)
Chloride: 97 mmol/L — ABNORMAL LOW (ref 98–111)
Creatinine, Ser: 2.19 mg/dL — ABNORMAL HIGH (ref 0.61–1.24)
GFR calc Af Amer: 35 mL/min — ABNORMAL LOW (ref >60)
GFR calc non Af Amer: 30 mL/min — ABNORMAL LOW (ref >60)
Glucose, Bld: 168 mg/dL — ABNORMAL HIGH (ref 70–99)
Potassium: 4.5 mmol/L (ref 3.5–5.1)
Sodium: 134 mmol/L — ABNORMAL LOW (ref 135–145)

## 2019-05-15 LAB — APTT: aPTT: 37 seconds — ABNORMAL HIGH (ref 24–36)

## 2019-05-15 LAB — C-REACTIVE PROTEIN: CRP: 9.7 mg/dL — ABNORMAL HIGH (ref <1.0)

## 2019-05-15 LAB — HEMOGLOBIN A1C
Hgb A1c MFr Bld: 8.7 % — ABNORMAL HIGH (ref 4.8–5.6)
Mean Plasma Glucose: 202.99 mg/dL

## 2019-05-15 LAB — PROTIME-INR
INR: 1.1 (ref 0.8–1.2)
Prothrombin Time: 14.2 seconds (ref 11.4–15.2)

## 2019-05-15 NOTE — Patient Instructions (Addendum)
DUE TO COVID-19 ONLY ONE VISITOR IS ALLOWED TO COME WITH YOU AND STAY IN THE WAITING ROOM ONLY DURING PRE OP AND PROCEDURE DAY OF SURGERY. THE 1 VISITOR MAY VISIT WITH YOU AFTER SURGERY IN YOUR PRIVATE ROOM DURING VISITING HOURS ONLY!   ONCE YOUR COVID TEST IS COMPLETED, PLEASE BEGIN THE QUARANTINE INSTRUCTIONS AS OUTLINED IN YOUR HANDOUT.                [facility]    Your procedure is scheduled on: Wednesday 05/16/2019   Report to [facility] Main  Entrance    Report to admitting at  1030 AM     Call this number if you have problems the morning of surgery [phone]   How to Manage Your Diabetes Before and After Surgery  Why is it important to control my blood sugar before and after surgery? . Improving blood sugar levels before and after surgery helps healing and can limit problems. . A way of improving blood sugar control is eating a healthy diet by: o  Eating less sugar and carbohydrates o  Increasing activity/exercise o  Talking with your doctor about reaching your blood sugar goals . High blood sugars (greater than 180 mg/dL) can raise your risk of infections and slow your recovery, so you will need to focus on controlling your diabetes during the weeks before surgery. . Make sure that the doctor who takes care of your diabetes knows about your planned surgery including the date and location.  How do I manage my blood sugar before surgery? . Check your blood sugar at least 4 times a day, starting 2 days before surgery, to make sure that the level is not too high or low. o Check your blood sugar the morning of your surgery when you wake up and every 2 hours until you get to the Short Stay unit. . If your blood sugar is less than 70 mg/dL, you will need to treat for low blood sugar: o Do not take insulin. o Treat a low blood sugar (less than 70 mg/dL) with  cup of clear juice (cranberry or apple), 4 glucose tablets, OR glucose gel. o Recheck blood sugar in 15  minutes after treatment (to make sure it is greater than 70 mg/dL). If your blood sugar is not greater than 70 mg/dL on recheck, call [phone] for further instructions. . Report your blood sugar to the short stay nurse when you get to Short Stay.  . If you are admitted to the hospital after surgery: o Your blood sugar will be checked by the staff and you will probably be given insulin after surgery (instead of oral diabetes medicines) to make sure you have good blood sugar levels. o The goal for blood sugar control after surgery is 80-180 mg/dL.   WHAT DO I DO ABOUT MY DIABETES MEDICATION?        The day before surgery, Do not take the Dapagliflozin Propanediol Wilder Glade)!  . Do not take oral diabetes medicines (pills) the morning of surgery.      . THE MORNING OF SURGERY, take  0  units of   Humalog mix 75/25     insulin.  . The day of surgery, do not take other diabetes injectables, including Byetta (exenatide), Bydureon (exenatide ER), Victoza (liraglutide), or Trulicity (dulaglutide).      Remember: Do not eat food  :After Midnight.    NO SOLID FOOD AFTER MIDNIGHT THE NIGHT PRIOR TO SURGERY AND  NOTHING BY MOUTH EXCEPT  CLEAR LIQUIDS UNTIL 0930 am   .   PLEASE FINISH  Gatorade G 2  DRINK PER SURGEON ORDER  WHICH NEEDS TO BE COMPLETED AT  0930 am .   CLEAR LIQUID DIET   Foods Allowed                                                                     Foods Excluded  Coffee and tea, regular and decaf                             liquids that you cannot  Plain Jell-O any favor except red or purple                                           see through such as: Fruit ices (not with fruit pulp)                                     milk, soups, orange juice  Iced Popsicles                                    All solid food Carbonated beverages, regular and diet                                    Cranberry, grape and apple juices Sports drinks like Gatorade Lightly seasoned  clear broth or consume(fat free) Sugar, honey syrup  Sample Menu Breakfast                                Lunch                                     Supper Cranberry juice                    Beef broth                            Chicken broth Jell-O                                     Grape juice                           Apple juice Coffee or tea                        Jell-O  Popsicle                                                Coffee or tea                        Coffee or tea  _____________________________________________________________________      BRUSH YOUR TEETH MORNING OF SURGERY AND RINSE YOUR MOUTH OUT, NO CHEWING GUM CANDY OR MINTS.     Take these medicines the morning of surgery with A SIP OF WATER: Gabapentin (Neurontin), Amlodipine (Norvasc), Omeprazole (Prilosec), Hydrocodone-acetaminophen (Norco/Vicodin) if needed for pain   DO NOT TAKE ANY DIABETIC MEDICATIONS DAY OF YOUR SURGERY!                               You may not have any metal on your body including hair pins and              piercings  Do not wear jewelry, make-up, lotions, powders or perfumes, deodorant                         Men may shave face and neck.   Do not bring valuables to the hospital. [place].  Contacts, dentures or bridgework may not be worn into surgery.  Leave suitcase in the car. After surgery it may be brought to your room.                  Please read over the following fact sheets you were given: _____________________________________________________________________             [facility] - Preparing for Surgery Before surgery, you can play an important role.  Because skin is not sterile, your skin needs to be as free of germs as possible.  You can reduce the number of germs on your skin by washing with CHG (chlorahexidine gluconate) soap before surgery.  CHG is an antiseptic cleaner which  kills germs and bonds with the skin to continue killing germs even after washing. Please DO NOT use if you have an allergy to CHG or antibacterial soaps.  If your skin becomes reddened/irritated stop using the CHG and inform your nurse when you arrive at Short Stay. Do not shave (including legs and underarms) for at least 48 hours prior to the first CHG shower.  You may shave your face/neck. Please follow these instructions carefully:  1.  Shower with CHG Soap the night before surgery and the  morning of Surgery.  2.  If you choose to wash your hair, wash your hair first as usual with your  normal  shampoo.  3.  After you shampoo, rinse your hair and body thoroughly to remove the  shampoo.                           4.  Use CHG as you would any other liquid soap.  You can apply chg directly  to the skin and wash                       Gently with  a scrungie or clean washcloth.  5.  Apply the CHG Soap to your body ONLY FROM THE NECK DOWN.   Do not use on face/ open                           Wound or open sores. Avoid contact with eyes, ears mouth and genitals (private parts).                       Wash face,  Genitals (private parts) with your normal soap.             6.  Wash thoroughly, paying special attention to the area where your surgery  will be performed.  7.  Thoroughly rinse your body with warm water from the neck down.  8.  DO NOT shower/wash with your normal soap after using and rinsing off  the CHG Soap.                9.  Pat yourself dry with a clean towel.            10.  Wear clean pajamas.            11.  Place clean sheets on your bed the night of your first shower and do not  sleep with pets. Day of Surgery : Do not apply any lotions/deodorants the morning of surgery.  Please wear clean clothes to the hospital/surgery center.  FAILURE TO FOLLOW THESE INSTRUCTIONS MAY RESULT IN THE CANCELLATION OF YOUR SURGERY PATIENT SIGNATURE_________________________________  NURSE  SIGNATURE__________________________________  ________________________________________________________________________   Adam Phenix  An incentive spirometer is a tool that can help keep your lungs clear and active. This tool measures how well you are filling your lungs with each breath. Taking long deep breaths may help reverse or decrease the chance of developing breathing (pulmonary) problems (especially infection) following:  A long period of time when you are unable to move or be active. BEFORE THE PROCEDURE   If the spirometer includes an indicator to show your best effort, your nurse or respiratory therapist will set it to a desired goal.  If possible, sit up straight or lean slightly forward. Try not to slouch.  Hold the incentive spirometer in an upright position. INSTRUCTIONS FOR USE  1. Sit on the edge of your bed if possible, or sit up as far as you can in bed or on a chair. 2. Hold the incentive spirometer in an upright position. 3. Breathe out normally. 4. Place the mouthpiece in your mouth and seal your lips tightly around it. 5. Breathe in slowly and as deeply as possible, raising the piston or the ball toward the top of the column. 6. Hold your breath for 3-5 seconds or for as long as possible. Allow the piston or ball to fall to the bottom of the column. 7. Remove the mouthpiece from your mouth and breathe out normally. 8. Rest for a few seconds and repeat Steps 1 through 7 at least 10 times every 1-2 hours when you are awake. Take your time and take a few normal breaths between deep breaths. 9. The spirometer may include an indicator to show your best effort. Use the indicator as a goal to work toward during each repetition. 10. After each set of 10 deep breaths, practice coughing to be sure your lungs are clear. If you have an incision (the cut made at the time of surgery), support your  incision when coughing by placing a pillow or rolled up towels firmly  against it. Once you are able to get out of bed, walk around indoors and cough well. You may stop using the incentive spirometer when instructed by your caregiver.  RISKS AND COMPLICATIONS  Take your time so you do not get dizzy or light-headed.  If you are in pain, you may need to take or ask for pain medication before doing incentive spirometry. It is harder to take a deep breath if you are having pain. AFTER USE  Rest and breathe slowly and easily.  It can be helpful to keep track of a log of your progress. Your caregiver can provide you with a simple table to help with this. If you are using the spirometer at home, follow these instructions: [place] IF:   You are having difficultly using the spirometer.  You have trouble using the spirometer as often as instructed.  Your pain medication is not giving enough relief while using the spirometer.  You develop fever of 100.5 F (38.1 C) or higher. SEEK IMMEDIATE MEDICAL CARE IF:   You cough up bloody sputum that had not been present before.  You develop fever of 102 F (38.9 C) or greater.  You develop worsening pain at or near the incision site. MAKE SURE YOU:   Understand these instructions.  Will watch your condition.  Will get help right away if you are not doing well or get worse. Document Released: 08/09/2006 Document Revised: 06/21/2011 Document Reviewed: 10/10/2006 Exit[employer] Patient Information 2014 [employer], [place].   ________________________________________________________________________  WHAT IS A BLOOD TRANSFUSION? Blood Transfusion Information  A transfusion is the replacement of blood or some of its parts. Blood is made up of multiple cells which provide different functions.  Red blood cells carry oxygen and are used for blood loss replacement.  White blood cells fight against infection.  Platelets control bleeding.  Plasma helps clot blood.  Other blood products are available for  specialized needs, such as hemophilia or other clotting disorders. BEFORE THE TRANSFUSION  Who gives blood for transfusions?   Healthy volunteers who are fully evaluated to make sure their blood is safe. This is blood bank blood. Transfusion therapy is the safest it has ever been in the practice of medicine. Before blood is taken from a donor, a complete history is taken to make sure that person has no history of diseases nor engages in risky social behavior (examples are intravenous drug use or sexual activity with multiple partners). The donor's travel history is screened to minimize risk of transmitting infections, such as malaria. The donated blood is tested for signs of infectious diseases, such as HIV and hepatitis. The blood is then tested to be sure it is compatible with you in order to minimize the chance of a transfusion reaction. If you or a relative donates blood, this is often done in anticipation of surgery and is not appropriate for emergency situations. It takes many days to process the donated blood. RISKS AND COMPLICATIONS Although transfusion therapy is very safe and saves many lives, the main dangers of transfusion include:   Getting an infectious disease.  Developing a transfusion reaction. This is an allergic reaction to something in the blood you were given. Every precaution is taken to prevent this. The decision to have a blood transfusion has been considered carefully by your caregiver before blood is given. Blood is not given unless the benefits outweigh the risks. AFTER THE TRANSFUSION  Right  after receiving a blood transfusion, you will usually feel much better and more energetic. This is especially true if your red blood cells have gotten low (anemic). The transfusion raises the level of the red blood cells which carry oxygen, and this usually causes an energy increase.  The nurse administering the transfusion will monitor you carefully for complications. HOME CARE  INSTRUCTIONS  No special instructions are needed after a transfusion. You may find your energy is better. Speak with your caregiver about any limitations on activity for underlying diseases you may have. SEEK MEDICAL CARE IF:   Your condition is not improving after your transfusion.  You develop redness or irritation at the intravenous (IV) site. SEEK IMMEDIATE MEDICAL CARE IF:  Any of the following symptoms occur over the next 12 hours:  Shaking chills.  You have a temperature by mouth above 102 F (38.9 C), not controlled by medicine.  Chest, back, or muscle pain.  People around you feel you are not acting correctly or are confused.  Shortness of breath or difficulty breathing.  Dizziness and fainting.  You get a rash or develop hives.  You have a decrease in urine output.  Your urine turns a dark color or changes to pink, red, or brown. Any of the following symptoms occur over the next 10 days:  You have a temperature by mouth above 102 F (38.9 C), not controlled by medicine.  Shortness of breath.  Weakness after normal activity.  The white part of the eye turns yellow (jaundice).  You have a decrease in the amount of urine or are urinating less often.  Your urine turns a dark color or changes to pink, red, or brown. Document Released: 03/26/2000 Document Revised: 06/21/2011 Document Reviewed: 11/13/2007 [facility] Patient Information 2014 [place], [place].  _______________________________________________________________________

## 2019-05-15 NOTE — Progress Notes (Addendum)
PCP - Marvis Repress Murray,FNP Cardiologist - n/a Endocrinologist- Dr. Renato Shin  [place] 04/19/2019 epic  Nephrologist- Dr. Lawson Radar  [place] 05/11/2019  On chart  Chest x-ray - 05/07/2019 epic EKG - 05/15/2019 epic Stress Test - n/a ECHO - n/a Cardiac Cath - n/a  Sleep Study - n/a CPAP - n/a  Labs done 05/15/2019-CBC w/diff., CMP, PT/PTT, U/A, T&S, ESR, C reactive protein, HgA1C  Fasting Blood Sugar -160  Checks Blood Sugar _1-2____ times a week  Blood Thinner Instructions:n/a Aspirin Instructions:Aspirin EC 81 mg. Instructed patient to call Dr. Damita Dunnings office to ask him about stopping Aspirin for surgery on 05/16/2019. Patient verbalized understanding. Last Dose:05/14/2019  Anesthesia review:  Chart will be reviewed by Konrad Felix, PA -to review medical history for surgery.  Patient has a history of Chronic kidney disease Stage 2, HTN, DM type 2, alcoholism ( none  for past 10 years),and  neuropathy of feet .   Patient denies shortness of breath, fever, cough and chest pain at PAT appointment   Patient verbalized understanding of instructions that were given to them at the PAT appointment. Patient was also instructed that they will need to review over the PAT instructions again at home before surgery.

## 2019-05-15 NOTE — H&P (Signed)
TOTAL KNEE REVISION ADMISSION H&P  Patient is being admitted for left revision total knee arthroplasty.  Subjective:  Chief Complaint:left knee pain.  HPI: David Lindsey, 64 y.o. male, has a history of pain and functional disability in the left knee(s) due to infection and patient has failed non-surgical conservative treatments for greater than 12 weeks to include NSAID's and/or analgesics and activity modification. The indications for the revision of the total knee arthroplasty are history of total knee infection. Onset of symptoms was abrupt starting 2 weeks ago with rapidlly worsening course since that time.  Prior procedures on the left knee(s) include arthroplasty.  Patient currently rates pain in the left knee(s) at 10 out of 10 with activity. There is night pain, worsening of pain with activity and weight bearing, pain that interferes with activities of daily living, pain with passive range of motion and joint swelling.  Patient has evidence of infection by imaging studies. This condition presents safety issues increasing the risk of falls.   There is current active infection.  Patient Active Problem List   Diagnosis Date Noted  . 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 Degenerative arthritis of left knee 11/18/2017  . Left shoulder pain 08/29/2017  . History of total right knee replacement (TKR) 03/25/2017  . Arthralgia 02/24/2017  . Edema 01/13/2017  . Wellness examination 01/30/2015  . Myalgia and myositis 01/28/2014  . Unspecified constipation 03/13/2013  . Screening for prostate cancer 01/18/2013  . Diabetes mellitus with renal manifestation ([place]) 01/18/2013  . Encounter for long-term (current) use of other medications 03/24/2012  . HYPOKALEMIA 06/06/2008  . Pancytopenia ([place]) 06/06/2008  . Hypogonadism, male 12/22/2007  . HYPERCHOLESTEROLEMIA 12/22/2007  . ALCOHOLISM 12/22/2007  . ESOPHAGITIS, REFLUX 12/22/2007  . Leg pain 12/22/2007  . Essential hypertension  11/10/2006  . Disorder resulting from impaired renal function 11/10/2006   Past Medical History:  Diagnosis Date  . ALCOHOLISM 12/22/2007  . ANEMIA, IRON DEFICIENCY 12/26/2007  . Arthritis    OA  . Blood transfusion without reported diagnosis    as a child  . DIABETES MELLITUS, TYPE II 11/10/2006  . ESOPHAGITIS, REFLUX 12/22/2007  . GERD (gastroesophageal reflux disease)   . HYPERCHOLESTEROLEMIA 12/22/2007  . HYPERTENSION 11/10/2006  . HYPOGONADISM 12/22/2007   Prob. due to ETOH  . HYPOKALEMIA 06/06/2008  . Leukopenia   . Neuropathy    Chronic Neuropathy BOTH FEET  . Pancytopenia 06/06/2008  . RENAL INSUFFICIENCY 11/10/2006   SEES [place] [facility] STAGE 2 CKD    Past Surgical History:  Procedure Laterality Date  . APPENDECTOMY  AGE 51 OR 10  . COLONOSCOPY  08-21-2004   [facility]- normal  . COLONSCOPY  2017  . ELECTROCARDIOGRAM  11/07/2006  . ESOPHAGOGASTRODUODENOSCOPY  08/20/2004  . EYE SURGERY Bilateral 2014   [facility] FOR CATARACTS  . REPLACEMENT TOTAL KNEE Right 2011  . TOTAL KNEE ARTHROPLASTY Left 02/20/2018   Procedure: LEFT TOTAL KNEE ARTHROPLASTY;  Surgeon: Frederik Pear, MD;  Location: [facility];  Service: Orthopedics;  Laterality: Left;  . UPPER GASTROINTESTINAL ENDOSCOPY      No current facility-administered medications for this encounter.   Current Outpatient Medications  Medication Sig Dispense Refill Last Dose  . amLODipine (NORVASC) 10 MG tablet Take 1 tablet (10 mg total) by mouth daily. 90 tablet 1   . Apple Cider Vinegar 600 MG CAPS Take 600 mg by mouth daily.     . Ascorbic Acid (VITAMIN C) 1000 MG tablet Take 1,000 mg by mouth every evening.     Marland Kitchen  aspirin EC 81 MG tablet Take 1 tablet (81 mg total) by mouth 2 (two) times daily. 60 tablet 0   . Biotin w/ Vitamins C & E (HAIR SKIN & NAILS GUMMIES PO) Take 1 tablet by mouth every evening.     . Carboxymethylcellul-Glycerin (LUBRICATING EYE DROPS OP) Place 1 drop into both eyes daily as needed (dry eyes).     . cephALEXin  (KEFLEX) 500 MG capsule Take 1 capsule (500 mg total) by mouth 3 (three) times daily. 21 capsule 0   . clindamycin (CLEOCIN) 300 MG capsule Take 1 capsule (300 mg total) by mouth 3 (three) times daily. 21 capsule 0   . clotrimazole-betamethasone (LOTRISONE) cream APPLY  CREAM TOPICALLY TO AFFECTED AREA THREE TIMES DAILY AS NEEDED FOR  RASH 45 g 0   . Coenzyme Q10 (COQ-10) 200 MG CAPS Take 200 mg by mouth every evening.      . Cyanocobalamin (B-12) 5000 MCG CAPS Take 5,000 mcg by mouth every evening.      . dapagliflozin propanediol (FARXIGA) 10 MG TABS tablet Take 10 mg by mouth daily. 90 tablet 3   . docusate sodium (COLACE) 100 MG capsule Take 200 mg by mouth every evening.     Marland Kitchen doxycycline (VIBRA-TABS) 100 MG tablet Take 1 tablet (100 mg total) by mouth 2 (two) times daily for 7 days. 14 tablet 0   . EASY TOUCH PEN NEEDLES 31G X 8 MM MISC USE ONCE DAILY WITH VICTOZA 100 each 11   . gabapentin (NEURONTIN) 300 MG capsule Take 1 capsule (300 mg total) by mouth 3 (three) times daily. 270 capsule 2   . glucose blood (ONE TOUCH ULTRA TEST) test strip Use as instructed check blood sugar two times daily      . HYDROcodone-acetaminophen (NORCO/VICODIN) 5-325 MG tablet Take 1 tablet by mouth every 6 (six) hours as needed. 14 tablet 0   . Insulin Lispro Prot & Lispro (HUMALOG MIX 75/25 KWIKPEN) (75-25) 100 UNIT/ML Kwikpen Inject 28 Units into the skin daily with breakfast. 15 mL 11   . liraglutide (VICTOZA) 18 MG/3ML SOPN Inject 0.3 mLs (1.8 mg total) into the skin every morning. 9 mL 3   . lisinopril-hydrochlorothiazide (ZESTORETIC) 10-12.5 MG tablet TAKE ONE-HALF TABLET BY  MOUTH DAILY 45 tablet 3   . Menthol, Topical Analgesic, (STOPAIN ROLL-ON) 8 % LIQD Apply 1 application topically 3 (three) times daily as needed (for knee pain.).     Marland Kitchen Methylcellulose, Laxative, (HM FIBER) 500 MG TABS Take 500 mg by mouth 2 (two) times daily. Fiberwell Gummies     . metoprolol succinate (TOPROL-XL) 25 MG 24 hr tablet  Take 0.5 tablets (12.5 mg total) by mouth at bedtime. 45 tablet 3   . Multiple Vitamin (MULTIVITAMIN WITH MINERALS) TABS tablet Take 1 tablet by mouth every evening.      . Omega-3-6-9 CAPS Take 1 capsule by mouth every evening. 1600mg      . omeprazole (PRILOSEC) 20 MG capsule Take 1 capsule (20 mg total) by mouth daily. 90 capsule 3   . rosuvastatin (CRESTOR) 5 MG tablet Take 1 tablet (5 mg total) by mouth daily. (Patient taking differently: Take 5 mg by mouth at bedtime. ) 90 tablet 3   . sildenafil (VIAGRA) 100 MG tablet TAKE 1 TABLET BY MOUTH ONCE DAILY AS NEEDED 6 tablet 2   . traZODone (DESYREL) 100 MG tablet TAKE 1 TABLET BY MOUTH AT  BEDTIME 90 tablet 3   . trolamine salicylate (ASPERCREME) 10 % cream  Apply 1 application topically as needed (knee pain).     . vitamin E 400 UNIT capsule Take 400 Units by mouth every evening.       Allergies  Allergen Reactions  . Pioglitazone Swelling    Social History   Tobacco Use  . Smoking status: Never Smoker  . Smokeless tobacco: Never Used  Substance Use Topics  . Alcohol use: No    Alcohol/week: 0.0 standard drinks    Comment: LAST USE 10 YRS AGO    Family History  Problem Relation Age of Onset  . Cancer Mother        Colon Cancer  . Colon cancer Mother   . Colon polyps Brother   . Esophageal cancer Neg Hx   . Rectal cancer Neg Hx   . Stomach cancer Neg Hx       Review of Systems  Constitutional: Negative.   HENT: Negative.   Eyes: Negative.   Respiratory: Negative.   Cardiovascular: Negative.        HTN  Gastrointestinal: Negative.   Endocrine:       Diabetes  Genitourinary: Negative.   Musculoskeletal: Positive for arthralgias and myalgias.  Allergic/Immunologic: Negative.   Neurological: Negative.   Hematological: Negative.   Psychiatric/Behavioral: Negative.      Objective:  Physical Exam  Constitutional: He is oriented to person, place, and time. He appears well-developed and well-nourished.  HENT:  Head:  Normocephalic and atraumatic.  Cardiovascular: Intact distal pulses.  Respiratory: Effort normal.  Musculoskeletal:        General: Tenderness present.     Cervical back: Normal range of motion and neck supple.     Comments: He walks with a left-sided limp.  Range of motion of the right knee is from 0-105 collateral ligaments are stable 1+ effusion mildly increased warmth neurovascular intact distally toes are pink and well perfused although he does have some mildly decreased sensation of the foot pad.    Neurological: He is alert and oriented to person, place, and time.  Skin: Skin is warm and dry.  Psychiatric: He has a normal mood and affect. His behavior is normal. Judgment and thought content normal.    Vital signs in last 24 hours:    Labs:  Estimated body mass index is 40.59 kg/m as calculated from the following:   Height as of 05/10/19: 5\' 11"  (1.803 m).   Weight as of 05/10/19: 132 kg.  Imaging Review Plain radiographs demonstrate  well-placed well fixed Depuy attune prostheses.    Assessment/Plan:  End stage arthritis, left knee(s) with infection.   The patient history, physical examination, clinical judgment of the provider and imaging studies are consistent with end stage degenerative joint disease of the left knee(s), previous total knee arthroplasty. Revision total knee arthroplasty is deemed medically necessary. The treatment options including medical management, injection therapy, arthroscopy and revision arthroplasty were discussed at length. The risks and benefits of revision total knee arthroplasty were presented and reviewed. The risks due to aseptic loosening, infection, stiffness, patella tracking problems, thromboembolic complications and other imponderables were discussed. The patient acknowledged the explanation, agreed to proceed with the plan and consent was signed. Patient is being admitted for inpatient treatment for surgery, pain control, PT, OT,  prophylactic antibiotics, VTE prophylaxis, progressive ambulation and ADL's and discharge planning.The patient is planning to be discharged home with home health services

## 2019-05-16 ENCOUNTER — Ambulatory Visit (HOSPITAL_COMMUNITY): Payer: Medicare Other | Admitting: Certified Registered"

## 2019-05-16 ENCOUNTER — Encounter (HOSPITAL_COMMUNITY): Payer: Self-pay | Admitting: Orthopedic Surgery

## 2019-05-16 ENCOUNTER — Encounter (HOSPITAL_COMMUNITY)
Admission: RE | Disposition: A | Discharge status: Home / Self Care | Payer: Self-pay | Source: Home / Self Care | Attending: Orthopedic Surgery

## 2019-05-16 ENCOUNTER — Ambulatory Visit (HOSPITAL_COMMUNITY)
Admission: RE | Admit: 2019-05-16 | Discharge: 2019-05-17 | Disposition: A | Discharge status: Home / Self Care | Payer: Medicare Other | Attending: Orthopedic Surgery | Admitting: Orthopedic Surgery

## 2019-05-16 DIAGNOSIS — Z79899 Other long term (current) drug therapy: Secondary | ICD-10-CM | POA: Insufficient documentation

## 2019-05-16 DIAGNOSIS — M1712 Unilateral primary osteoarthritis, left knee: Secondary | ICD-10-CM | POA: Diagnosis present

## 2019-05-16 DIAGNOSIS — B9689 Other specified bacterial agents as the cause of diseases classified elsewhere: Secondary | ICD-10-CM

## 2019-05-16 DIAGNOSIS — Y849 Medical procedure, unspecified as the cause of abnormal reaction of the patient, or of later complication, without mention of misadventure at the time of the procedure: Secondary | ICD-10-CM | POA: Diagnosis not present

## 2019-05-16 DIAGNOSIS — E1122 Type 2 diabetes mellitus with diabetic chronic kidney disease: Secondary | ICD-10-CM | POA: Insufficient documentation

## 2019-05-16 DIAGNOSIS — Z96651 Presence of right artificial knee joint: Secondary | ICD-10-CM | POA: Diagnosis present

## 2019-05-16 DIAGNOSIS — T8454XA Infection and inflammatory reaction due to internal left knee prosthesis, initial encounter: Secondary | ICD-10-CM | POA: Insufficient documentation

## 2019-05-16 DIAGNOSIS — Z6839 Body mass index (BMI) 39.0-39.9, adult: Secondary | ICD-10-CM | POA: Diagnosis not present

## 2019-05-16 DIAGNOSIS — N183 Chronic kidney disease, stage 3 unspecified: Secondary | ICD-10-CM | POA: Diagnosis not present

## 2019-05-16 DIAGNOSIS — E1121 Type 2 diabetes mellitus with diabetic nephropathy: Secondary | ICD-10-CM | POA: Diagnosis not present

## 2019-05-16 DIAGNOSIS — Z794 Long term (current) use of insulin: Secondary | ICD-10-CM | POA: Diagnosis not present

## 2019-05-16 DIAGNOSIS — Z96659 Presence of unspecified artificial knee joint: Secondary | ICD-10-CM

## 2019-05-16 DIAGNOSIS — I10 Essential (primary) hypertension: Secondary | ICD-10-CM | POA: Diagnosis not present

## 2019-05-16 DIAGNOSIS — Y793 Surgical instruments, materials and orthopedic devices (including sutures) associated with adverse incidents: Secondary | ICD-10-CM | POA: Diagnosis not present

## 2019-05-16 DIAGNOSIS — K2100 Gastro-esophageal reflux disease with esophagitis, without bleeding: Secondary | ICD-10-CM | POA: Insufficient documentation

## 2019-05-16 DIAGNOSIS — Z96652 Presence of left artificial knee joint: Secondary | ICD-10-CM | POA: Diagnosis not present

## 2019-05-16 DIAGNOSIS — N1832 Chronic kidney disease, stage 3b: Secondary | ICD-10-CM | POA: Diagnosis present

## 2019-05-16 DIAGNOSIS — T8459XA Infection and inflammatory reaction due to other internal joint prosthesis, initial encounter: Secondary | ICD-10-CM

## 2019-05-16 DIAGNOSIS — Z888 Allergy status to other drugs, medicaments and biological substances status: Secondary | ICD-10-CM

## 2019-05-16 DIAGNOSIS — Z978 Presence of other specified devices: Secondary | ICD-10-CM

## 2019-05-16 DIAGNOSIS — Z7982 Long term (current) use of aspirin: Secondary | ICD-10-CM | POA: Diagnosis not present

## 2019-05-16 DIAGNOSIS — M00862 Arthritis due to other bacteria, left knee: Secondary | ICD-10-CM | POA: Diagnosis not present

## 2019-05-16 DIAGNOSIS — G8918 Other acute postprocedural pain: Secondary | ICD-10-CM | POA: Diagnosis not present

## 2019-05-16 DIAGNOSIS — E7800 Pure hypercholesterolemia, unspecified: Secondary | ICD-10-CM | POA: Diagnosis present

## 2019-05-16 DIAGNOSIS — M1990 Unspecified osteoarthritis, unspecified site: Secondary | ICD-10-CM | POA: Diagnosis not present

## 2019-05-16 DIAGNOSIS — E1129 Type 2 diabetes mellitus with other diabetic kidney complication: Secondary | ICD-10-CM | POA: Diagnosis not present

## 2019-05-16 DIAGNOSIS — I129 Hypertensive chronic kidney disease with stage 1 through stage 4 chronic kidney disease, or unspecified chronic kidney disease: Secondary | ICD-10-CM | POA: Diagnosis not present

## 2019-05-16 HISTORY — PX: I & D KNEE WITH POLY EXCHANGE: SHX5024

## 2019-05-16 LAB — TYPE AND SCREEN
ABO/RH(D): O POS
Antibody Screen: NEGATIVE

## 2019-05-16 LAB — GLUCOSE, CAPILLARY
Glucose-Capillary: 234 mg/dL — ABNORMAL HIGH (ref 70–99)
Glucose-Capillary: 192 mg/dL — ABNORMAL HIGH (ref 70–99)
Glucose-Capillary: 181 mg/dL — ABNORMAL HIGH (ref 70–99)
Glucose-Capillary: 403 mg/dL — ABNORMAL HIGH (ref 70–99)
Glucose-Capillary: 336 mg/dL — ABNORMAL HIGH (ref 70–99)

## 2019-05-16 LAB — GLUCOSE, RANDOM: Glucose, Bld: 385 mg/dL — ABNORMAL HIGH (ref 70–99)

## 2019-05-16 SURGERY — IRRIGATION AND DEBRIDEMENT KNEE WITH POLY EXCHANGE
Anesthesia: Spinal | Site: Knee | Laterality: Left

## 2019-05-16 MED ORDER — PHENYLEPHRINE HCL-NACL 10-0.9 MG/250ML-% IV SOLN
INTRAVENOUS | Status: DC | PRN
  Administered 2019-05-16: 20 ug/min via INTRAVENOUS

## 2019-05-16 MED ORDER — METOCLOPRAMIDE HCL 5 MG/ML IJ SOLN: 5.0000 mg | Freq: Three times a day (TID) | INTRAMUSCULAR | Status: DC | PRN

## 2019-05-16 MED ORDER — BUPIVACAINE HCL (PF) 0.25 % IJ SOLN
INTRAMUSCULAR | Status: AC
  Filled 2019-05-16: qty 30

## 2019-05-16 MED ORDER — ACETAMINOPHEN 325 MG PO TABS
325.0000 mg | ORAL_TABLET | Freq: Four times a day (QID) | ORAL | Status: DC | PRN
  Administered 2019-05-16: 650 mg via ORAL
  Filled 2019-05-16: qty 2

## 2019-05-16 MED ORDER — PROPOFOL 500 MG/50ML IV EMUL
INTRAVENOUS | Status: AC
  Filled 2019-05-16: qty 50

## 2019-05-16 MED ORDER — OXYCHLOROSENE SODIUM POWD
1.0000 "application " | Freq: Once | Status: AC
  Administered 2019-05-16: 1

## 2019-05-16 MED ORDER — GABAPENTIN 100 MG PO CAPS: 100.0000 mg | ORAL_CAPSULE | Freq: Three times a day (TID) | ORAL | Status: DC

## 2019-05-16 MED ORDER — LIDOCAINE 2% (20 MG/ML) 5 ML SYRINGE
INTRAMUSCULAR | Status: AC
  Filled 2019-05-16: qty 5

## 2019-05-16 MED ORDER — METOCLOPRAMIDE HCL 5 MG PO TABS: 5.0000 mg | ORAL_TABLET | Freq: Three times a day (TID) | ORAL | Status: DC | PRN

## 2019-05-16 MED ORDER — PROPOFOL 10 MG/ML IV BOLUS
INTRAVENOUS | Status: AC
  Filled 2019-05-16: qty 20

## 2019-05-16 MED ORDER — ONDANSETRON HCL 4 MG/2ML IJ SOLN
INTRAMUSCULAR | Status: AC
  Filled 2019-05-16: qty 2

## 2019-05-16 MED ORDER — ONDANSETRON HCL 4 MG/2ML IJ SOLN
INTRAMUSCULAR | Status: DC | PRN
  Administered 2019-05-16: 4 mg via INTRAVENOUS

## 2019-05-16 MED ORDER — PROPOFOL 10 MG/ML IV BOLUS
INTRAVENOUS | Status: DC | PRN
  Administered 2019-05-16: 40 mg via INTRAVENOUS

## 2019-05-16 MED ORDER — DOCUSATE SODIUM 100 MG PO CAPS: 100.0000 mg | ORAL_CAPSULE | Freq: Two times a day (BID) | ORAL | Status: DC

## 2019-05-16 MED ORDER — KCL IN DEXTROSE-NACL 20-5-0.45 MEQ/L-%-% IV SOLN
INTRAVENOUS | Status: DC
  Filled 2019-05-16: qty 1000

## 2019-05-16 MED ORDER — MIDAZOLAM HCL 2 MG/2ML IJ SOLN
INTRAMUSCULAR | Status: DC | PRN
  Administered 2019-05-16 (×2): 1 mg via INTRAVENOUS

## 2019-05-16 MED ORDER — PANTOPRAZOLE SODIUM 40 MG PO TBEC: 40.0000 mg | DELAYED_RELEASE_TABLET | Freq: Every day | ORAL | Status: DC

## 2019-05-16 MED ORDER — TRANEXAMIC ACID 1000 MG/10ML IV SOLN
2000.0000 mg | INTRAVENOUS | Status: DC
  Filled 2019-05-16: qty 20

## 2019-05-16 MED ORDER — PROMETHAZINE HCL 25 MG/ML IJ SOLN: 6.2500 mg | INTRAMUSCULAR | Status: DC | PRN

## 2019-05-16 MED ORDER — ASCORBIC ACID 500 MG PO TABS
1000.0000 mg | ORAL_TABLET | Freq: Every evening | ORAL | Status: DC
  Administered 2019-05-16: 1000 mg via ORAL
  Filled 2019-05-16: qty 2

## 2019-05-16 MED ORDER — PANTOPRAZOLE SODIUM 40 MG PO TBEC
40.0000 mg | DELAYED_RELEASE_TABLET | Freq: Every day | ORAL | Status: DC
  Administered 2019-05-16 – 2019-05-17 (×2): 40 mg via ORAL
  Filled 2019-05-16 (×2): qty 1

## 2019-05-16 MED ORDER — MIDAZOLAM HCL 2 MG/2ML IJ SOLN: 0.5000 mg | Freq: Once | INTRAMUSCULAR | Status: DC | PRN

## 2019-05-16 MED ORDER — DIPHENHYDRAMINE HCL 12.5 MG/5ML PO ELIX: 12.5000 mg | ORAL_SOLUTION | ORAL | Status: DC | PRN

## 2019-05-16 MED ORDER — METOPROLOL SUCCINATE ER 25 MG PO TB24
25.0000 mg | ORAL_TABLET | Freq: Every day | ORAL | Status: DC
  Administered 2019-05-16: 25 mg via ORAL
  Filled 2019-05-16: qty 1

## 2019-05-16 MED ORDER — MIDAZOLAM HCL 2 MG/2ML IJ SOLN
1.0000 mg | Freq: Once | INTRAMUSCULAR | Status: AC
  Administered 2019-05-16: 1 mg via INTRAVENOUS
  Filled 2019-05-16: qty 2

## 2019-05-16 MED ORDER — HYDROMORPHONE HCL 1 MG/ML IJ SOLN: 0.2500 mg | INTRAMUSCULAR | Status: DC | PRN

## 2019-05-16 MED ORDER — ONDANSETRON HCL 4 MG/2ML IJ SOLN: 4.0000 mg | Freq: Four times a day (QID) | INTRAMUSCULAR | Status: DC | PRN

## 2019-05-16 MED ORDER — ASPIRIN 81 MG PO CHEW
81.0000 mg | CHEWABLE_TABLET | Freq: Two times a day (BID) | ORAL | Status: DC
  Administered 2019-05-16 – 2019-05-17 (×2): 81 mg via ORAL
  Filled 2019-05-16 (×2): qty 1

## 2019-05-16 MED ORDER — LEVOFLOXACIN 500 MG PO TABS
750.0000 mg | ORAL_TABLET | ORAL | Status: DC
  Administered 2019-05-16: 750 mg via ORAL
  Filled 2019-05-16: qty 2

## 2019-05-16 MED ORDER — SODIUM CHLORIDE 0.9 % IV SOLN
2.0000 g | INTRAVENOUS | Status: DC
  Administered 2019-05-16: 2 g via INTRAVENOUS
  Filled 2019-05-16: qty 2
  Filled 2019-05-16: qty 20

## 2019-05-16 MED ORDER — TRANEXAMIC ACID-NACL 1000-0.7 MG/100ML-% IV SOLN
1000.0000 mg | INTRAVENOUS | Status: AC
  Administered 2019-05-16: 1000 mg via INTRAVENOUS
  Filled 2019-05-16: qty 100

## 2019-05-16 MED ORDER — ROPIVACAINE HCL 7.5 MG/ML IJ SOLN
INTRAMUSCULAR | Status: DC | PRN
  Administered 2019-05-16: 20 mL via PERINEURAL

## 2019-05-16 MED ORDER — DEXAMETHASONE SODIUM PHOSPHATE 10 MG/ML IJ SOLN
INTRAMUSCULAR | Status: AC
  Filled 2019-05-16: qty 1

## 2019-05-16 MED ORDER — SODIUM CHLORIDE 0.9 % IR SOLN
Status: DC | PRN
  Administered 2019-05-16: 3000 mL

## 2019-05-16 MED ORDER — FENTANYL CITRATE (PF) 100 MCG/2ML IJ SOLN
50.0000 ug | Freq: Once | INTRAMUSCULAR | Status: AC
  Administered 2019-05-16: 50 ug via INTRAVENOUS
  Filled 2019-05-16: qty 2

## 2019-05-16 MED ORDER — CANAGLIFLOZIN 100 MG PO TABS
100.0000 mg | ORAL_TABLET | Freq: Every day | ORAL | Status: DC
  Administered 2019-05-17: 100 mg via ORAL
  Filled 2019-05-16: qty 1

## 2019-05-16 MED ORDER — METHOCARBAMOL 500 MG PO TABS
500.0000 mg | ORAL_TABLET | Freq: Four times a day (QID) | ORAL | Status: DC | PRN
  Administered 2019-05-16 – 2019-05-17 (×3): 500 mg via ORAL
  Filled 2019-05-16 (×3): qty 1

## 2019-05-16 MED ORDER — PROPOFOL 500 MG/50ML IV EMUL
INTRAVENOUS | Status: DC | PRN
  Administered 2019-05-16: 100 ug/kg/min via INTRAVENOUS

## 2019-05-16 MED ORDER — HYDROMORPHONE HCL 1 MG/ML IJ SOLN
0.5000 mg | INTRAMUSCULAR | Status: DC | PRN
  Administered 2019-05-16: 1 mg via INTRAVENOUS
  Filled 2019-05-16: qty 1

## 2019-05-16 MED ORDER — POLYVINYL ALCOHOL 1.4 % OP SOLN
2.0000 [drp] | Freq: Four times a day (QID) | OPHTHALMIC | Status: DC | PRN
  Filled 2019-05-16: qty 15

## 2019-05-16 MED ORDER — HYDROGEN PEROXIDE 3 % EX SOLN
CUTANEOUS | Status: AC
  Filled 2019-05-16: qty 473

## 2019-05-16 MED ORDER — DOCUSATE SODIUM 100 MG PO CAPS
200.0000 mg | ORAL_CAPSULE | Freq: Every evening | ORAL | Status: DC
  Administered 2019-05-16: 200 mg via ORAL
  Filled 2019-05-16: qty 2

## 2019-05-16 MED ORDER — POLYETHYLENE GLYCOL 3350 17 G PO PACK: 17.0000 g | PACK | Freq: Every day | ORAL | Status: DC | PRN

## 2019-05-16 MED ORDER — TRANEXAMIC ACID-NACL 1000-0.7 MG/100ML-% IV SOLN
1000.0000 mg | Freq: Once | INTRAVENOUS | Status: AC
  Administered 2019-05-16: 1000 mg via INTRAVENOUS
  Filled 2019-05-16: qty 100

## 2019-05-16 MED ORDER — TRAZODONE HCL 100 MG PO TABS
100.0000 mg | ORAL_TABLET | Freq: Every day | ORAL | Status: DC
  Administered 2019-05-16: 100 mg via ORAL
  Filled 2019-05-16: qty 1

## 2019-05-16 MED ORDER — BUPIVACAINE LIPOSOME 1.3 % IJ SUSP
20.0000 mL | Freq: Once | INTRAMUSCULAR | Status: DC
  Filled 2019-05-16: qty 20

## 2019-05-16 MED ORDER — OXYCODONE HCL 5 MG PO TABS
5.0000 mg | ORAL_TABLET | ORAL | Status: DC | PRN
  Administered 2019-05-16 – 2019-05-17 (×4): 10 mg via ORAL
  Filled 2019-05-16 (×4): qty 2

## 2019-05-16 MED ORDER — ASPIRIN EC 81 MG PO TBEC: 81.0000 mg | DELAYED_RELEASE_TABLET | Freq: Two times a day (BID) | ORAL | 0 refills | Status: DC

## 2019-05-16 MED ORDER — OXYCODONE-ACETAMINOPHEN 5-325 MG PO TABS: 1.0000 | ORAL_TABLET | ORAL | 0 refills | Status: DC | PRN

## 2019-05-16 MED ORDER — DEXAMETHASONE SODIUM PHOSPHATE 10 MG/ML IJ SOLN
10.0000 mg | Freq: Once | INTRAMUSCULAR | Status: AC
  Administered 2019-05-17: 10 mg via INTRAVENOUS
  Filled 2019-05-16: qty 1

## 2019-05-16 MED ORDER — FUROSEMIDE 20 MG PO TABS
20.0000 mg | ORAL_TABLET | Freq: Every day | ORAL | Status: DC
  Administered 2019-05-17: 20 mg via ORAL
  Filled 2019-05-16: qty 1

## 2019-05-16 MED ORDER — DEXAMETHASONE SODIUM PHOSPHATE 10 MG/ML IJ SOLN
INTRAMUSCULAR | Status: DC | PRN
  Administered 2019-05-16: 4 mg via INTRAVENOUS

## 2019-05-16 MED ORDER — BISACODYL 5 MG PO TBEC: 5.0000 mg | DELAYED_RELEASE_TABLET | Freq: Every day | ORAL | Status: DC | PRN

## 2019-05-16 MED ORDER — LACTATED RINGERS IV SOLN: INTRAVENOUS | Status: DC

## 2019-05-16 MED ORDER — BUPIVACAINE IN DEXTROSE 0.75-8.25 % IT SOLN
INTRATHECAL | Status: DC | PRN
  Administered 2019-05-16: 1.8 mL via INTRATHECAL

## 2019-05-16 MED ORDER — BUPIVACAINE HCL (PF) 0.25 % IJ SOLN
INTRAMUSCULAR | Status: DC | PRN
  Administered 2019-05-16: 30 mL

## 2019-05-16 MED ORDER — CHLORHEXIDINE GLUCONATE 4 % EX LIQD: 60.0000 mL | Freq: Once | CUTANEOUS | Status: DC

## 2019-05-16 MED ORDER — METHOCARBAMOL 500 MG IVPB - SIMPLE MED
500.0000 mg | Freq: Four times a day (QID) | INTRAVENOUS | Status: DC | PRN
  Filled 2019-05-16: qty 50

## 2019-05-16 MED ORDER — GABAPENTIN 300 MG PO CAPS
300.0000 mg | ORAL_CAPSULE | Freq: Three times a day (TID) | ORAL | Status: DC
  Administered 2019-05-16 – 2019-05-17 (×3): 300 mg via ORAL
  Filled 2019-05-16 (×3): qty 1

## 2019-05-16 MED ORDER — ALUM & MAG HYDROXIDE-SIMETH 200-200-20 MG/5ML PO SUSP: 30.0000 mL | ORAL | Status: DC | PRN

## 2019-05-16 MED ORDER — POVIDONE-IODINE 10 % EX SWAB
2.0000 "application " | Freq: Once | CUTANEOUS | Status: AC
  Administered 2019-05-16: 2 via TOPICAL

## 2019-05-16 MED ORDER — OXYCHLOROSENE SODIUM POWD
Status: AC
  Filled 2019-05-16: qty 2

## 2019-05-16 MED ORDER — FLEET ENEMA 7-19 GM/118ML RE ENEM: 1.0000 | ENEMA | Freq: Once | RECTAL | Status: DC | PRN

## 2019-05-16 MED ORDER — MENTHOL 3 MG MT LOZG: 1.0000 | LOZENGE | OROMUCOSAL | Status: DC | PRN

## 2019-05-16 MED ORDER — LIRAGLUTIDE 18 MG/3ML ~~LOC~~ SOPN: 1.8000 mg | PEN_INJECTOR | SUBCUTANEOUS | Status: DC

## 2019-05-16 MED ORDER — LISINOPRIL 10 MG PO TABS
10.0000 mg | ORAL_TABLET | Freq: Every day | ORAL | Status: DC
  Administered 2019-05-16 – 2019-05-17 (×2): 10 mg via ORAL
  Filled 2019-05-16 (×2): qty 1

## 2019-05-16 MED ORDER — PHENYLEPHRINE 40 MCG/ML (10ML) SYRINGE FOR IV PUSH (FOR BLOOD PRESSURE SUPPORT)
PREFILLED_SYRINGE | INTRAVENOUS | Status: DC | PRN
  Administered 2019-05-16 (×2): 40 ug via INTRAVENOUS
  Administered 2019-05-16: 80 ug via INTRAVENOUS

## 2019-05-16 MED ORDER — FENTANYL CITRATE (PF) 100 MCG/2ML IJ SOLN
INTRAMUSCULAR | Status: AC
  Filled 2019-05-16: qty 2

## 2019-05-16 MED ORDER — DEXTROSE 5 % IV SOLN
3.0000 g | INTRAVENOUS | Status: AC
  Administered 2019-05-16: 3 g via INTRAVENOUS
  Filled 2019-05-16: qty 3

## 2019-05-16 MED ORDER — INSULIN ASPART PROT & ASPART (70-30 MIX) 100 UNIT/ML ~~LOC~~ SUSP
28.0000 [IU] | Freq: Every day | SUBCUTANEOUS | Status: DC
  Administered 2019-05-17: 28 [IU] via SUBCUTANEOUS
  Filled 2019-05-16: qty 10

## 2019-05-16 MED ORDER — PHENOL 1.4 % MT LIQD
1.0000 | OROMUCOSAL | Status: DC | PRN
  Filled 2019-05-16: qty 177

## 2019-05-16 MED ORDER — METOPROLOL SUCCINATE ER 25 MG PO TB24
12.5000 mg | ORAL_TABLET | Freq: Every day | ORAL | Status: DC
  Administered 2019-05-16: 12.5 mg via ORAL
  Filled 2019-05-16: qty 1

## 2019-05-16 MED ORDER — ONDANSETRON HCL 4 MG PO TABS: 4.0000 mg | ORAL_TABLET | Freq: Four times a day (QID) | ORAL | Status: DC | PRN

## 2019-05-16 MED ORDER — INSULIN ASPART 100 UNIT/ML ~~LOC~~ SOLN
0.0000 [IU] | Freq: Three times a day (TID) | SUBCUTANEOUS | Status: DC
  Administered 2019-05-16: 3 [IU] via SUBCUTANEOUS
  Administered 2019-05-17: 5 [IU] via SUBCUTANEOUS
  Administered 2019-05-17: 8 [IU] via SUBCUTANEOUS

## 2019-05-16 MED ORDER — MIDAZOLAM HCL 2 MG/2ML IJ SOLN
INTRAMUSCULAR | Status: AC
  Filled 2019-05-16: qty 2

## 2019-05-16 MED ORDER — PHENYLEPHRINE 40 MCG/ML (10ML) SYRINGE FOR IV PUSH (FOR BLOOD PRESSURE SUPPORT)
PREFILLED_SYRINGE | INTRAVENOUS | Status: AC
  Filled 2019-05-16: qty 10

## 2019-05-16 MED ORDER — MEPERIDINE HCL 50 MG/ML IJ SOLN: 6.2500 mg | INTRAMUSCULAR | Status: DC | PRN

## 2019-05-16 MED ORDER — TIZANIDINE HCL 2 MG PO TABS: 2.0000 mg | ORAL_TABLET | Freq: Four times a day (QID) | ORAL | 0 refills | Status: DC | PRN

## 2019-05-16 SURGICAL SUPPLY — 55 items
ATTUNE PSRP INSR SZ8 5 KNEE (Insert) ×1 IMPLANT
BAG SPEC THK2 15X12 ZIP CLS (MISCELLANEOUS) ×1
BAG ZIPLOCK 12X15 (MISCELLANEOUS) ×2 IMPLANT
BNDG CMPR MED 15X6 ELC VLCR LF (GAUZE/BANDAGES/DRESSINGS) ×1
BNDG ELASTIC 6X15 VLCR STRL LF (GAUZE/BANDAGES/DRESSINGS) ×1 IMPLANT
BNDG ELASTIC 6X5.8 VLCR STR LF (GAUZE/BANDAGES/DRESSINGS) ×2 IMPLANT
CLOTH BEACON ORANGE TIMEOUT ST (SAFETY) ×2 IMPLANT
COVER SURGICAL LIGHT HANDLE (MISCELLANEOUS) ×2 IMPLANT
COVER WAND RF STERILE (DRAPES) IMPLANT
CUFF TOURN SGL QUICK 34 (TOURNIQUET CUFF) ×2
CUFF TRNQT CYL 34X4.125X (TOURNIQUET CUFF) ×1 IMPLANT
DRAPE U-SHAPE 47X51 STRL (DRAPES) ×2 IMPLANT
DRSG ADAPTIC 3X8 NADH LF (GAUZE/BANDAGES/DRESSINGS) ×2 IMPLANT
DRSG AQUACEL AG ADV 3.5X14 (GAUZE/BANDAGES/DRESSINGS) ×1 IMPLANT
DRSG PAD ABDOMINAL 8X10 ST (GAUZE/BANDAGES/DRESSINGS) ×6 IMPLANT
DURAPREP 26ML APPLICATOR (WOUND CARE) ×2 IMPLANT
ELECT REM PT RETURN 15FT ADLT (MISCELLANEOUS) ×2 IMPLANT
EVACUATOR 1/8 PVC DRAIN (DRAIN) ×2 IMPLANT
GAUZE SPONGE 4X4 12PLY STRL (GAUZE/BANDAGES/DRESSINGS) ×2 IMPLANT
GLOVE BIO SURGEON STRL SZ7.5 (GLOVE) ×2 IMPLANT
GLOVE BIO SURGEON STRL SZ8 (GLOVE) ×2 IMPLANT
GLOVE BIOGEL PI IND STRL 7.0 (GLOVE) ×1 IMPLANT
GLOVE BIOGEL PI IND STRL 8 (GLOVE) ×1 IMPLANT
GLOVE BIOGEL PI INDICATOR 7.0 (GLOVE) ×1
GLOVE BIOGEL PI INDICATOR 8 (GLOVE) ×1
GLOVE SURG SS PI 7.0 STRL IVOR (GLOVE) ×2 IMPLANT
GOWN STRL REUS W/TWL LRG LVL3 (GOWN DISPOSABLE) ×6 IMPLANT
HANDPIECE INTERPULSE COAX TIP (DISPOSABLE) ×2
IMMOBILIZER KNEE 20 (SOFTGOODS) ×2
IMMOBILIZER KNEE 20 THIGH 36 (SOFTGOODS) ×1 IMPLANT
KIT TURNOVER KIT A (KITS) IMPLANT
MANIFOLD NEPTUNE II (INSTRUMENTS) ×2 IMPLANT
NS IRRIG 1000ML POUR BTL (IV SOLUTION) ×2 IMPLANT
PACK TOTAL KNEE CUSTOM (KITS) ×2 IMPLANT
PADDING CAST COTTON 6X4 STRL (CAST SUPPLIES) ×4 IMPLANT
PENCIL SMOKE EVACUATOR (MISCELLANEOUS) IMPLANT
PROTECTOR NERVE ULNAR (MISCELLANEOUS) ×2 IMPLANT
SET HNDPC FAN SPRY TIP SCT (DISPOSABLE) ×1 IMPLANT
SPONGE LAP 18X18 RF (DISPOSABLE) ×3 IMPLANT
STAPLER VISISTAT 35W (STAPLE) ×2 IMPLANT
STRIP CLOSURE SKIN 1/2X4 (GAUZE/BANDAGES/DRESSINGS) ×4 IMPLANT
SUT MNCRL AB 4-0 PS2 18 (SUTURE) ×2 IMPLANT
SUT PDS AB 1 CT1 27 (SUTURE) ×6 IMPLANT
SUT VIC AB 1 CT1 36 (SUTURE) ×1 IMPLANT
SUT VIC AB 2-0 CT1 27 (SUTURE) ×6
SUT VIC AB 2-0 CT1 TAPERPNT 27 (SUTURE) ×3 IMPLANT
SUT VIC AB 3-0 CT1 36 (SUTURE) ×3 IMPLANT
SUT VIC AB 3-0 SH 27 (SUTURE) ×8
SUT VIC AB 3-0 SH 27X BRD (SUTURE) IMPLANT
SUT VLOC 180 0 24IN GS25 (SUTURE) IMPLANT
SWAB COLLECTION DEVICE MRSA (MISCELLANEOUS) ×2 IMPLANT
SWAB CULTURE ESWAB REG 1ML (MISCELLANEOUS) ×2 IMPLANT
TRAY FOLEY MTR SLVR 16FR STAT (SET/KITS/TRAYS/PACK) ×2 IMPLANT
WATER STERILE IRR 1000ML POUR (IV SOLUTION) ×2 IMPLANT
WRAP KNEE MAXI GEL POST OP (GAUZE/BANDAGES/DRESSINGS) ×4 IMPLANT

## 2019-05-16 NOTE — Anesthesia Preprocedure Evaluation (Addendum)
Anesthesia Evaluation  Patient identified by MRN, date of birth, ID band Patient awake    Reviewed: Allergy & Precautions, NPO status , Patient's Chart, lab work & pertinent test results, reviewed documented beta blocker date and time   History of Anesthesia Complications Negative for: history of anesthetic complications  Airway Mallampati: I  TM Distance: >3 FB Neck ROM: Full    Dental  (+) Caps, Dental Advisory Given, Missing   Pulmonary neg pulmonary ROS,  05/15/2019 SARS coronavirus NEG   breath sounds clear to auscultation       Cardiovascular hypertension, Pt. on medications and Pt. on home beta blockers (-) angina Rhythm:Regular Rate:Normal     Neuro/Psych    GI/Hepatic GERD  Controlled,(+)     substance abuse  alcohol use,   Endo/Other  diabetes (glu 234), Insulin DependentMorbid obesity  Renal/GU Renal InsufficiencyRenal disease (creat 2.19)     Musculoskeletal  (+) Arthritis ,   Abdominal (+) + obese,   Peds  Hematology negative hematology ROS (+)   Anesthesia Other Findings   Reproductive/Obstetrics                            Anesthesia Physical Anesthesia Plan  ASA: III  Anesthesia Plan: Spinal   Post-op Pain Management:  Regional for Post-op pain   Induction:   PONV Risk Score and Plan: 1 and Ondansetron and Treatment may vary due to age or medical condition  Airway Management Planned: Natural Airway and Simple Face Mask  Additional Equipment:   Intra-op Plan:   Post-operative Plan:   Informed Consent: I have reviewed the patients History and Physical, chart, labs and discussed the procedure including the risks, benefits and alternatives for the proposed anesthesia with the patient or authorized representative who has indicated his/her understanding and acceptance.     Dental advisory given  Plan Discussed with: CRNA and Surgeon  Anesthesia Plan Comments:  (Plan routine monitors, SAB with adductor canal block for post op analgesia)        Anesthesia Quick Evaluation

## 2019-05-16 NOTE — Transfer of Care (Signed)
Immediate Anesthesia Transfer of Care Note  Patient: David Lindsey  Procedure(s) Performed: LEFT KNEE IRRIGATION AND DEBRIDEMENT WITH POLY EXCHANGE (Left Knee)  Patient Location: PACU  Anesthesia Type:Spinal and MAC combined with regional for post-op pain  Level of Consciousness: awake and patient cooperative  Airway & Oxygen Therapy: Patient Spontanous Breathing and Patient connected to face mask oxygen  Post-op Assessment: Report given to RN and Post -op Vital signs reviewed and stable  Post vital signs: Reviewed and stable  Last Vitals:  Vitals Value Taken Time  BP 127/78 05/16/19 1418  Temp    Pulse 94 05/16/19 1420  Resp 19 05/16/19 1420  SpO2 100 % 05/16/19 1420  Vitals shown include unvalidated device data.  Last Pain:  Vitals:   05/16/19 1147  TempSrc:   PainSc: 0-No pain         Complications: No apparent anesthesia complications

## 2019-05-16 NOTE — Evaluation (Signed)
Physical Therapy Evaluation Patient Details Name: David Lindsey MRN: [id number] DOB: [birth year] Today's Date: 05/16/2019   History of Present Illness  Patient is 64 y.o. male s/p Lt TKR (I&D with poly-exchange) on 05/16/19 with PMH significant for bil TKA, DM, peripheral neuropathy, GERD, HTN, and OA.    Clinical Impression  David Lindsey is a 64 y.o. male POD 0 s/p Lt TKR. Patient reports independence with mobility at baseline, however he has had to use RW over the last week due to knee pain and buckling. Patient is now limited by functional impairments (see PT problem list below) and requires min-mod assist for transfers and gait with RW. Patient was able to ambulate ~80 feet with RW and min assist. Patient instructed in exercise to facilitate circulation and educated on retrograde massage for edema management. Patient will benefit from continued skilled PT interventions to address impairments and progress towards PLOF. Acute PT will follow to progress mobility and stair training in preparation for safe discharge home.     Follow Up Recommendations Follow surgeon's recommendation for DC plan and follow-up therapies    Equipment Recommendations  None recommended by PT    Recommendations for Other Services       Precautions / Restrictions Precautions Precautions: Fall Restrictions Weight Bearing Restrictions: No      Mobility  Bed Mobility Overal bed mobility: Needs Assistance Bed Mobility: Supine to Sit     Supine to sit: Min assist;HOB elevated     General bed mobility comments: cues for use of bed rail for rolling towards Rt side and to raise up to EOB, assist for Rt LE mobility  Transfers Overall transfer level: Needs assistance Equipment used: Rolling walker (2 wheeled) Transfers: Sit to/from Stand Sit to Stand: From elevated surface;Mod assist         General transfer comment: cues for hand placement and technique with RW, min-mod assist to perform power up and to  complete rise  Ambulation/Gait Ambulation/Gait assistance: Min assist Gait Distance (Feet): 80 Feet Assistive device: Rolling walker (2 wheeled) Gait Pattern/deviations: Step-to pattern;Decreased stride length;Decreased stance time - left;Decreased step length - right;Wide base of support Gait velocity: decreased   General Gait Details: cues for safe hand placement initially and for safe proximity to RW throughout gait, no overt LOB noted, pt reliant on support to reduce WB in Lt LE, knee immobilizer used for safety  Stairs            Wheelchair Mobility    Modified Rankin (Stroke Patients Only)       Balance Overall balance assessment: Needs assistance Sitting-balance support: Feet unsupported;Feet supported Sitting balance-Leahy Scale: Good     Standing balance support: During functional activity;Bilateral upper extremity supported Standing balance-Leahy Scale: Poor            Pertinent Vitals/Pain Pain Assessment: 0-10 Pain Score: 5  Pain Location: Lt knee Pain Descriptors / Indicators: Aching;Sore Pain Intervention(s): Limited activity within patient's tolerance;Monitored during session;Repositioned    Home Living Family/patient expects to be discharged to:: Private residence Living Arrangements: Spouse/significant other Available Help at Discharge: Family;Available 24 hours/day Type of Home: House Home Access: Stairs to enter Entrance Stairs-Rails: None Entrance Stairs-Number of Steps: 2+1 Home Layout: One level Home Equipment: Environmental consultant - 2 wheels;Cane - single point;Bedside commode      Prior Function Level of Independence: Independent         Comments: pt has been using RW for last week due to knee pain and buckling, wife has been helping  with getting in/out of shower     Hand Dominance   Dominant Hand: Right    Extremity/Trunk Assessment   Upper Extremity Assessment Upper Extremity Assessment: Overall WFL for tasks assessed    Lower  Extremity Assessment Lower Extremity Assessment: LLE deficits/detail LLE Deficits / Details: pt with notable edema in Lt doral foot, unable to complete SLR, 4- for quad strength with seated MMT, 4- for ankle dorsiflexion strength LLE Sensation: WNL LLE Coordination: WNL    Cervical / Trunk Assessment Cervical / Trunk Assessment: Normal  Communication   Communication: No difficulties  Cognition Arousal/Alertness: Awake/alert Behavior During Therapy: WFL for tasks assessed/performed Overall Cognitive Status: Within Functional Limits for tasks assessed         General Comments      Exercises Total Joint Exercises Ankle Circles/Pumps: AROM;20 reps;Seated;Both Other Exercises Other Exercises: educaated pt/wife on retrograde massage for Lt LE due to notable edema in Lt foot   Assessment/Plan    PT Assessment Patient needs continued PT services  PT Problem List Decreased strength;Decreased balance;Decreased mobility;Decreased range of motion;Decreased activity tolerance;Decreased knowledge of use of DME       PT Treatment Interventions DME instruction;Functional mobility training;Balance training;Patient/family education;Therapeutic activities;Gait training;Stair training;Therapeutic exercise    PT Goals (Current goals can be found in the Care Plan section)  Acute Rehab PT Goals Patient Stated Goal: to improve independence with walking PT Goal Formulation: With patient Time For Goal Achievement: 05/23/19 Potential to Achieve Goals: Good    Frequency 7X/week    AM-PAC PT "6 Clicks" Mobility  Outcome Measure Help needed turning from your back to your side while in a flat bed without using bedrails?: A Little Help needed moving from lying on your back to sitting on the side of a flat bed without using bedrails?: A Little Help needed moving to and from a bed to a chair (including a wheelchair)?: A Little Help needed standing up from a chair using your arms (e.g., wheelchair or  bedside chair)?: A Little Help needed to walk in hospital room?: A Little Help needed climbing 3-5 steps with a railing? : A Little 6 Click Score: 18    End of Session Equipment Utilized During Treatment: Gait belt Activity Tolerance: Patient tolerated treatment well Patient left: in chair;with call bell/phone within reach;with family/visitor present;with chair alarm set Nurse Communication: Mobility status PT Visit Diagnosis: Muscle weakness (generalized) (M62.81);Difficulty in walking, not elsewhere classified (R26.2)    Time: [id number] PT Time Calculation (min) (ACUTE ONLY): 35 min   Charges:   PT Evaluation $PT Eval Low Complexity: 1 Low PT Treatments $Gait Training: 8-22 mins        Verner Mould, DPT Physical Therapist with [facility] [phone]  05/16/2019 6:03 PM

## 2019-05-16 NOTE — Discharge Instructions (Signed)

## 2019-05-16 NOTE — Progress Notes (Signed)
Assisted Dr. Carswell Jackson with left, ultrasound guided, adductor canal block. Side rails up, monitors on throughout procedure. See vital signs in flow sheet. Tolerated Procedure well.  

## 2019-05-16 NOTE — Op Note (Signed)
DATE OF PROCEDURE: 12/22/2012  PREOPERATIVE DIAGNOSIS: Infected Left Total Knee  Estimated body mass index is 35.23 kg/(m^2) as calculated from the following:  Height as of this encounter: 6' 2.5" (1.892 m).  Weight as of this encounter: 126.1 kg (278 lb).  POSTOPERATIVE DIAGNOSIS: Infected Left Total Knee  PROCEDURE: Radical irrigation and debridement of infected total knee arthroplasty with revision of DePuy tibial component rotating platform #8 5 mm Attune RPbearing  SURGEON: [id number]  ASSISTANT: Eric K. Barton Dubois (present throughout entire procedure and necessary for timely completion of the procedure)  ANESTHESIA: Per Anes note  Estimated blood loss: 350 cc DRAINS: foley, 2 medium hemovac in knee joint TOURNIQUET TIME: not used COMPLICATIONS: None  SPECIMENS: None, we had to good fluid cultures preoperatively one of them growing Serratia marcescens.  INDICATIONS FOR PROCEDURE: Patient had routine primary left total knee arthroplasty 02/20/2018 and did well until approximately 10 days ago when he had some swelling and pain in his foot that progressed up his leg saw his primary care physician was placed on doxycycline a few days later the knee itself swelled up he went to see Dr. Lynne Leader at [facility] health care Dr. Georgina Snell aspirated 120 cc of turbid fluid that is grown out Serratia marcescens and had a white count of 82,000 with many polys.  The patient has really had any fever peripheral white count is 5.6 and sed rate was 100.  When he presented to my office yesterday I aspirated another 60 cc of turbid fluid and cultures are pending.  He is taken for radical irrigation debridement revision of the polyethylene bearing placement of deep drains placement of a PICC line and IV antibiotics under the supervision of infectious diseases.  He understands that the chance of this succeeding as a single procedure is somewhere between 50 and 60% and if it fails he will need a two-stage  reconstruction.  The risks and benefits of surgery were discussed at length in the office.  DESCRIPTION OF PROCEDURE: The patient identified by armband, and taken to the operating room, appropriate anesthetic monitors were attached and anesthesia was induced.  Tourniquet was applied high to the left thigh to foot position was applied to the table in a lateral post.  The left lower extremity was then prepped and draped in usual sterile fashion from the ankle to the tourniquet.  Timeout procedure was performed.  The knee was flexed to 120 degrees and the skin along the old incision line was infiltrated with Marcaine solution.  Using a #10 blade we then cut through the skin and subcutaneous tissue down to the fascia over the quadriceps tendon patella and patellar tendon this was reflected medially allowing [place] to perform a medial parapatellar arthrotomy where we immediately counted more turbid fluid that was drained with a sucker.  We then peeled the superficial medial collateral ligament off the proximal flare of the tibia reflected the patella laterally and performed a radical synovectomy of all the anterior medial and lateral tissues.  The tissue was noted to be relatively soft and mildly inflamed.  The bleeding was such that a tourniquet was not required.  After we did perform the anterior medial and lateral synovectomies the patella was everted allowing [place] to work around posteriorly.  A 1 inch wide osteotome was used to section the post on the 5 mm bearing which was then removed.  This gave [place] better access to the posterior compartment where we again performed a thorough synovectomy.  Satisfied with  the removal of all obviously infected tissues the wound was then pulse lavaged with a total of 6 L of normal saline solution alternating with hydrogen peroxide and Clorpactin.  Once the irrigation have been completed a new #8 5 mm attune RP bearing was placed at the knee was reduced and taken through range of motion.   Deep Hemovac drains were placed.  Parapatellar arthrotomy was closed with running #1 Vicryl suture in the subcutaneous tissue with running 3-0 Vicryl suture, subcuticular 3-0 Vicryl suture.  Aquacel dressing with an Ace wrap was applied the patient was then awakened and taken to the recovery room without difficulty.    The patient was then awakened, extubated, and taken to recovery room without difficulty.  Frederik Pear J  12/22/2012, 2:57 PM

## 2019-05-16 NOTE — Interval H&P Note (Signed)
History and Physical Interval Note:  05/16/2019 12:05 PM  David Lindsey  has presented today for surgery, with the diagnosis of infected left knee replacement.  The various methods of treatment have been discussed with the patient and family. After consideration of risks, benefits and other options for treatment, the patient has consented to  Procedure(s): LEFT KNEE IRRIGATION AND DEBRIDEMENT WITH POLY EXCHANGE (Left) as a surgical intervention.  The patient's history has been reviewed, patient examined, no change in status, stable for surgery.  I have reviewed the patient's chart and labs.  Questions were answered to the patient's satisfaction.     [name]

## 2019-05-16 NOTE — Anesthesia Procedure Notes (Signed)
Anesthesia Regional Block: Adductor canal block   Pre-Anesthetic Checklist: ,, timeout performed, Correct Patient, Correct Site, Correct Laterality, Correct Procedure, Correct Position, site marked, Risks and benefits discussed,  Surgical consent,  Pre-op evaluation,  At surgeon's request and post-op pain management  Laterality: Left and Lower  Prep: chloraprep       Needles:  Injection technique: Single-shot  Needle Type: Echogenic Needle     Needle Length: 9cm  Needle Gauge: 21     Additional Needles:   Procedures:,,,, ultrasound used (permanent image in chart),,,,  Narrative:  Start time: 05/16/2019 11:38 AM End time: 05/16/2019 11:44 AM Injection made incrementally with aspirations every 5 mL.  Performed by: Personally  Anesthesiologist: Annye Asa, MD  Additional Notes: Pt identified in Holding room.  Monitors applied. Working IV access confirmed. Sterile prep L thigh.  #21ga ECHOgenic needle into adductor canal with US guidance.  20cc 0.75% Ropivacaine injected incrementally after negative test dose.  Patient asymptomatic, VSS, no heme aspirated, tolerated well.  Jenita Seashore, MD

## 2019-05-16 NOTE — Anesthesia Procedure Notes (Signed)
Spinal  Patient location during procedure: OR Start time: 05/16/2019 12:26 PM Staffing Anesthesiologist: Annye Asa, MD Resident/CRNA: Eben Burow, CRNA Preanesthetic Checklist Completed: patient identified, IV checked, site marked, risks and benefits discussed, surgical consent, monitors and equipment checked, pre-op evaluation and timeout performed Spinal Block Patient position: sitting Prep: DuraPrep and site prepped and draped Patient monitoring: heart rate, cardiac monitor, continuous pulse ox and blood pressure Approach: midline Location: L3-4 Injection technique: single-shot Needle Needle type: Sprotte  Needle gauge: 24 G Needle length: 9 cm Assessment Sensory level: T4 Additional Notes Pt placed in sitting position, spinal kit expiration date checked and verified, + CSF, - heme, pt tolerated well. Supervised throughout procedure by Dr Ambrose Pancoast.

## 2019-05-16 NOTE — Anesthesia Postprocedure Evaluation (Signed)
Anesthesia Post Note  Patient: David Lindsey  Procedure(s) Performed: LEFT KNEE IRRIGATION AND DEBRIDEMENT WITH POLY EXCHANGE (Left Knee)     Patient location during evaluation: PACU Anesthesia Type: Spinal Level of consciousness: awake and alert, patient cooperative and oriented Pain management: pain level controlled Vital Signs Assessment: post-procedure vital signs reviewed and stable Respiratory status: spontaneous breathing, nonlabored ventilation and respiratory function stable Cardiovascular status: blood pressure returned to baseline and stable Postop Assessment: no apparent nausea or vomiting, spinal receding and patient able to bend at knees Anesthetic complications: no    Last Vitals:  Vitals:   05/16/19 1500 05/16/19 1515  BP: 139/85 (!) 148/88  Pulse: 75 71  Resp: 17 13  Temp:    SpO2: 100% 100%    Last Pain:  Vitals:   05/16/19 1445  TempSrc:   PainSc: 0-No pain                 Jamerius Boeckman,E. Ophie Burrowes

## 2019-05-16 NOTE — Consult Note (Signed)
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  He can be treated with oral levofloxacin.  He has excellent oral bioavailability and bone penetration and there is no advantage to giving it IV.  On an initial 6-week course of therapy and will consider switching to oral trimethoprim sulfamethoxazole for right lower adverse side effect profile.  I told them that I would aim for at least a total of 6 months of antibiotic therapy.  He is in agreement with that plan.  Plan: 1. 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 vitamin C  1,000 mg Oral QPM  . aspirin  81 mg Oral BID  . [START ON 05/17/2019] canagliflozin  100 mg Oral QAC breakfast  . [START ON 05/17/2019] dexamethasone (DECADRON) injection  10 mg Intravenous Once  . docusate sodium  200 mg Oral QPM  . [START ON 05/17/2019] furosemide  20 mg Oral Daily  . gabapentin  300 mg Oral TID  . insulin aspart  0-15 Units Subcutaneous TID WC  . [START ON 05/17/2019] insulin aspart protamine- aspart  28 Units Subcutaneous Q breakfast  . [START ON 05/17/2019] liraglutide  1.8 mg Subcutaneous BH-q7a  . lisinopril  10 mg Oral Daily  . metoprolol succinate  12.5 mg Oral QHS  . pantoprazole  40 mg Oral Daily  . traZODone  100 mg Oral QHS   Continuous Infusions: . cefTRIAXone (ROCEPHIN)  IV 2 g (05/16/19 1740)  . dextrose 5 % and 0.45 % NaCl with KCl 20 mEq/L 125 mL/hr  at 05/16/19 1710  . methocarbamol (ROBAXIN) IV     PRN Meds:.[START ON 05/17/2019] acetaminophen, alum & mag hydroxide-simeth, bisacodyl, diphenhydrAMINE, HYDROmorphone (DILAUDID) injection, menthol-cetylpyridinium **OR** phenol, methocarbamol **OR** methocarbamol (ROBAXIN) IV, metoCLOPramide **OR** metoCLOPramide (REGLAN) injection, ondansetron **OR** ondansetron (ZOFRAN) IV, oxyCODONE, polyethylene glycol, polyvinyl alcohol, sodium phosphate  HPI: David Lindsey is a 64 y.o. 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  He did well until mid January of this year when he had sudden onset of swelling, pain, redness and warmth in his left knee.  He started having fever and sweats.  He was evaluated on 05/04/2019 and was treated with cephalexin for cellulitis.  He did not improve and underwent arthrocentesis on 05/08/2019.  Doxycycline was added to his therapy.  Synovial fluid analyses showed 82,250 white blood cells of which 86% were segmented neutrophils.  No organisms were seen on Gram stain but culture grew Serratia.  He was admitted to the hospital today and underwent incision and drainage with polyethylene exchange.   Review of Systems: Review of Systems  Constitutional: Positive for diaphoresis, fever and malaise/fatigue. Negative for chills.  HENT: Negative for congestion and sore throat.   Respiratory: Negative for cough and shortness of breath.   Cardiovascular: Negative for chest pain.  Gastrointestinal: Negative for abdominal pain, diarrhea,  nausea and vomiting.  Musculoskeletal: Positive for joint pain.  Skin: Negative for rash.    Past Medical History:  Diagnosis Date  . ALCOHOLISM 12/22/2007  . ANEMIA, IRON DEFICIENCY 12/26/2007  . Arthritis    OA  . Blood transfusion without reported diagnosis    as a child  . DIABETES MELLITUS, TYPE II 11/10/2006  . ESOPHAGITIS, REFLUX 12/22/2007  . GERD (gastroesophageal reflux disease)   . HYPERCHOLESTEROLEMIA  12/22/2007  . HYPERTENSION 11/10/2006  . HYPOGONADISM 12/22/2007   Prob. due to ETOH  . HYPOKALEMIA 06/06/2008  . Leukopenia   . Neuromuscular disorder ([place])    neuropathy in feet from Diabetes  . Neuropathy    Chronic Neuropathy BOTH FEET  . Pancytopenia 06/06/2008  . RENAL INSUFFICIENCY 11/10/2006   SEES [facility] STAGE 2 CKD    Social History   Tobacco Use  . Smoking status: Never Smoker  . Smokeless tobacco: Never Used  Substance Use Topics  . Alcohol use: No    Alcohol/week: 0.0 standard drinks    Comment: LAST USE 10 YRS AGO  . Drug use: No    Family History  Problem Relation Age of Onset  . Cancer Mother        Colon Cancer  . Colon cancer Mother   . Colon polyps Brother   . Esophageal cancer Neg Hx   . Rectal cancer Neg Hx   . Stomach cancer Neg Hx    Allergies  Allergen Reactions  . Pioglitazone Swelling    OBJECTIVE: Blood pressure 135/88, pulse (!) 108, temperature 98 F (36.7 C), temperature source Oral, resp. rate 18, height 5\' 11"  (1.803 m), weight 129.3 kg, SpO2 100 %.  Physical Exam Constitutional:      Comments: He appears comfortable and pleasant.  His wife is visiting.  He is sitting up in a chair.  Musculoskeletal:     Comments: His left knee is wrapped in surgical drains are in place.  Psychiatric:        Mood and Affect: Mood normal.     Lab Results Lab Results  Component Value Date   WBC 7.3 05/15/2019   HGB 12.3 (L) 05/15/2019   HCT 38.3 (L) 05/15/2019   MCV 87.4 05/15/2019   PLT 494 (H) 05/15/2019    Lab Results  Component Value Date   CREATININE 2.19 (H) 05/15/2019   BUN 24 (H) 05/15/2019   NA 134 (L) 05/15/2019   K 4.5 05/15/2019   CL 97 (L) 05/15/2019   CO2 26 05/15/2019    Lab Results  Component Value Date   ALT 57 (H) 05/10/2019   AST 32 05/10/2019   ALKPHOS 61 05/10/2019   BILITOT 0.5 05/10/2019    Sed Rate (mm/hr)  Date Value  05/15/2019 102 (H)  02/24/2017 20  01/28/2014 9   CRP (mg/dL)  Date  Value  05/15/2019 9.7 (H)   Microbiology: Recent Results (from the past 240 hour(s))  Anaerobic and Aerobic Culture     Status: Abnormal   Collection Time: 05/08/19  4:09 PM  Result Value Ref Range Status   Source: KNEE, LEFT  Final   Status: FINAL  Final   Gram Stain Few Polymorphonuclear leukocytes No organisms seen  Final   ANA RESULT: No anaerobes isolated.  Final   Source: KNEE, LEFT  Final   Status: FINAL  Final   Isolate 1: Serratia marcescens (A)  Final    Comment: Light growth of Serratia marcescens      Susceptibility  Serratia marcescens - AEROBIC BACTERIA, CULT NEGATIVE 1    AMOX/CLAVULANIC >=32 Resistant     CEFAZOLIN* >=64 Resistant      * For infections other than uncomplicated UTIcaused by E. coli, K. pneumoniae or P. mirabilis:Cefazolin is resistant if MIC > or = 8 mcg/mL.(Distinguishing susceptible versus intermediatefor isolates with MIC < or = 4 mcg/mL requiresadditional testing.)    CEFEPIME <=1 Sensitive     CEFTRIAXONE <=1 Sensitive     CIPROFLOXACIN <=0.25 Sensitive     LEVOFLOXACIN <=0.12 Sensitive     ERTAPENEM <=0.5 Sensitive     GENTAMICIN <=1 Sensitive     TOBRAMYCIN <=1 Sensitive     TRIMETH/SULFA* <=20 Sensitive      * For infections other than uncomplicated UTIcaused by E. coli, K. pneumoniae or P. mirabilis:Cefazolin is resistant if MIC > or = 8 mcg/mL.(Distinguishing susceptible versus intermediatefor isolates with MIC < or = 4 mcg/mL requiresadditional testing.)Legend:S = Susceptible  I = IntermediateR = Resistant  NS = Not susceptible* = Not tested  NR = Not reported**NN = See antimicrobic comments  SARS CORONAVIRUS 2 (TAT 6-24 HRS) Nasopharyngeal Nasopharyngeal Swab     Status: None   Collection Time: 05/15/19  3:04 PM   Specimen: Nasopharyngeal Swab  Result Value Ref Range Status   SARS Coronavirus 2 NEGATIVE NEGATIVE Final    Comment: (NOTE) SARS-CoV-2 target nucleic acids are NOT DETECTED. The SARS-CoV-2 RNA is generally detectable in  upper and lower respiratory specimens during the acute phase of infection. Negative results do not preclude SARS-CoV-2 infection, do not rule out co-infections with other pathogens, and should not be used as the sole basis for treatment or other patient management decisions. Negative results must be combined with clinical observations, patient history, and epidemiological information. The expected result is Negative. Fact Sheet for Patients: SugarRoll.be Fact Sheet for Healthcare Providers: https://www.woods-mathews.com/ This test is not yet approved or cleared by the [place] FDA and  has been authorized for detection and/or diagnosis of SARS-CoV-2 by FDA under an Emergency Use Authorization (EUA). This EUA will remain  in effect (meaning this test can be used) for the duration of the COVID-19 declaration under Section 56 4(b)(1) of the Act, 21 U.S.C. section 360bbb-3(b)(1), unless the authorization is terminated or revoked sooner. Performed at [facility], [place] [address], [place], [place] [address]     Michel Bickers, [place] [facility] [phone] pager   [phone] cell 05/16/2019, 6:31 PM

## 2019-05-17 ENCOUNTER — Encounter: Payer: Self-pay | Admitting: *Deleted

## 2019-05-17 DIAGNOSIS — N183 Chronic kidney disease, stage 3 unspecified: Secondary | ICD-10-CM | POA: Diagnosis not present

## 2019-05-17 DIAGNOSIS — E7800 Pure hypercholesterolemia, unspecified: Secondary | ICD-10-CM | POA: Diagnosis not present

## 2019-05-17 DIAGNOSIS — T8454XA Infection and inflammatory reaction due to internal left knee prosthesis, initial encounter: Secondary | ICD-10-CM | POA: Diagnosis not present

## 2019-05-17 DIAGNOSIS — E1121 Type 2 diabetes mellitus with diabetic nephropathy: Secondary | ICD-10-CM | POA: Diagnosis not present

## 2019-05-17 DIAGNOSIS — E1122 Type 2 diabetes mellitus with diabetic chronic kidney disease: Secondary | ICD-10-CM | POA: Diagnosis not present

## 2019-05-17 DIAGNOSIS — K2100 Gastro-esophageal reflux disease with esophagitis, without bleeding: Secondary | ICD-10-CM | POA: Diagnosis not present

## 2019-05-17 DIAGNOSIS — Z888 Allergy status to other drugs, medicaments and biological substances status: Secondary | ICD-10-CM | POA: Diagnosis not present

## 2019-05-17 DIAGNOSIS — M00862 Arthritis due to other bacteria, left knee: Secondary | ICD-10-CM | POA: Diagnosis not present

## 2019-05-17 DIAGNOSIS — Z794 Long term (current) use of insulin: Secondary | ICD-10-CM | POA: Diagnosis not present

## 2019-05-17 DIAGNOSIS — Z79899 Other long term (current) drug therapy: Secondary | ICD-10-CM | POA: Diagnosis not present

## 2019-05-17 DIAGNOSIS — Z96652 Presence of left artificial knee joint: Secondary | ICD-10-CM | POA: Diagnosis not present

## 2019-05-17 DIAGNOSIS — I129 Hypertensive chronic kidney disease with stage 1 through stage 4 chronic kidney disease, or unspecified chronic kidney disease: Secondary | ICD-10-CM | POA: Diagnosis not present

## 2019-05-17 DIAGNOSIS — M1990 Unspecified osteoarthritis, unspecified site: Secondary | ICD-10-CM | POA: Diagnosis not present

## 2019-05-17 DIAGNOSIS — Z7982 Long term (current) use of aspirin: Secondary | ICD-10-CM | POA: Diagnosis not present

## 2019-05-17 DIAGNOSIS — B9689 Other specified bacterial agents as the cause of diseases classified elsewhere: Secondary | ICD-10-CM | POA: Diagnosis not present

## 2019-05-17 LAB — CBC
HCT: 32 % — ABNORMAL LOW (ref 39.0–52.0)
Hemoglobin: 10.3 g/dL — ABNORMAL LOW (ref 13.0–17.0)
MCH: 28.5 pg (ref 26.0–34.0)
MCHC: 32.2 g/dL (ref 30.0–36.0)
MCV: 88.6 fL (ref 80.0–100.0)
Platelets: 351 10*3/uL (ref 150–400)
RBC: 3.61 MIL/uL — ABNORMAL LOW (ref 4.22–5.81)
RDW: 12.3 % (ref 11.5–15.5)
WBC: 9.4 10*3/uL (ref 4.0–10.5)
nRBC: 0 % (ref 0.0–0.2)

## 2019-05-17 LAB — BASIC METABOLIC PANEL
Anion gap: 10 (ref 5–15)
BUN: 23 mg/dL (ref 8–23)
CO2: 23 mmol/L (ref 22–32)
Calcium: 8.6 mg/dL — ABNORMAL LOW (ref 8.9–10.3)
Chloride: 96 mmol/L — ABNORMAL LOW (ref 98–111)
Creatinine, Ser: 1.84 mg/dL — ABNORMAL HIGH (ref 0.61–1.24)
GFR calc Af Amer: 44 mL/min — ABNORMAL LOW (ref >60)
GFR calc non Af Amer: 38 mL/min — ABNORMAL LOW (ref >60)
Glucose, Bld: 246 mg/dL — ABNORMAL HIGH (ref 70–99)
Potassium: 5.1 mmol/L (ref 3.5–5.1)
Sodium: 129 mmol/L — ABNORMAL LOW (ref 135–145)

## 2019-05-17 LAB — HEMOGLOBIN A1C
Hgb A1c MFr Bld: 9 % — ABNORMAL HIGH (ref 4.8–5.6)
Mean Plasma Glucose: 211.6 mg/dL

## 2019-05-17 LAB — GLUCOSE, CAPILLARY
Glucose-Capillary: 229 mg/dL — ABNORMAL HIGH (ref 70–99)
Glucose-Capillary: 282 mg/dL — ABNORMAL HIGH (ref 70–99)

## 2019-05-17 MED ORDER — LEVOFLOXACIN 750 MG PO TABS: 750.0000 mg | ORAL_TABLET | ORAL | 0 refills | Status: DC

## 2019-05-17 NOTE — Progress Notes (Signed)
Patient ID: David Lindsey, male   DOB: 10/30/1955, 64 y.o.   MRN: [id number] PATIENT ID: David Lindsey  MRN: [id number]  DOB/AGE:  [birth year] / 64 y.o.  1 Day Post-Op Procedure(s) (LRB): LEFT KNEE IRRIGATION AND DEBRIDEMENT WITH POLY EXCHANGE (Left)    PROGRESS NOTE Subjective: Patient is alert, oriented, no Nausea, no Vomiting, yes passing gas. Taking PO well. Denies SOB, Chest or Calf Pain. Using Incentive Spirometer, PAS in place. Ambulate 80', Patient reports pain as 2/10. Dr. Megan Salon of infectious diseases has seen the patient and recommended Levaquin for the next 6 weeks orally to treat the periprosthetic infection with Serratia marcescens.    Objective: Vital signs in last 24 hours: Vitals:   05/16/19 1850 05/16/19 2154 05/17/19 0138 05/17/19 0404  BP:  124/79 126/79 124/74  Pulse: (Abnormal) 107 98 75 84  Resp:  16 16 16   Temp:  98.6 F (37 C) 98 F (36.7 C) 98.1 F (36.7 C)  TempSrc:  Oral Oral Oral  SpO2:  100% 100% 100%  Weight:      Height:          Intake/Output from previous day: I/O last 3 completed shifts: In: 3370.6 [P.O.:1080; I.V.:1940; IV Piggyback:350.6] Out: 3295 [Urine:3085; Drains:10; Blood:200]   Intake/Output this shift: Total I/O In: 120 [P.O.:120] Out: 200 [Urine:200]   LABORATORY DATA: Recent Labs    05/15/19 1552 05/15/19 1552 05/16/19 1059 05/16/19 2157 05/16/19 2222 05/16/19 2319 05/17/19 0421 05/17/19 0725  WBC 7.3  --   --   --   --   --  9.4  --   HGB 12.3*  --   --   --   --   --  10.3*  --   HCT 38.3*  --   --   --   --   --  32.0*  --   PLT 494*  --   --   --   --   --  351  --   NA 134*  --   --   --   --   --  129*  --   K 4.5  --   --   --   --   --  5.1  --   CL 97*  --   --   --   --   --  96*  --   CO2 26  --   --   --   --   --  23  --   BUN 24*  --   --   --   --   --  23  --   CREATININE 2.19*  --   --   --   --   --  1.84*  --   GLUCOSE 168*   < >  --   --  385*  --  246*  --   GLUCAP  --   --    < > 403*  --   336*  --  229*  INR 1.1  --   --   --   --   --   --   --   CALCIUM 9.8   < >  --   --   --   --  8.6*  --    < > = values in this interval not displayed.    Examination: Neurologically intact ABD soft Neurovascular intact Sensation intact distally Intact pulses distally Dorsiflexion/Plantar flexion intact Incision: dressing C/D/I No cellulitis present Compartment soft}  Drain had 10 cc drainage since surgery and was discontinued this morning.  The fluid appeared clear.  Assessment:   1 Day Post-Op Procedure(s) (LRB): LEFT KNEE IRRIGATION AND DEBRIDEMENT WITH POLY EXCHANGE (Left) ADDITIONAL DIAGNOSIS: Expected Acute Blood Loss Anemia, Diabetes and Hypertension  Patient's anticipated LOS is less than 2 midnights, meeting these requirements: - Younger than 66 - Lives within 1 hour of care - Has a competent adult at home to recover with post-op recover - NO history of  - Chronic pain requiring opiods  - Diabetes  - Coronary Artery Disease  - Heart failure  - Heart attack  - Stroke  - DVT/VTE  - Cardiac arrhythmia  - Respiratory Failure/COPD  - Renal failure  - Anemia  - Advanced Liver disease       Plan: PT/OT WBAT, AROM and PROM  DVT Prophylaxis:  SCDx72hrs, ASA 81 mg BID x 2 weeks DISCHARGE PLAN: Home DISCHARGE NEEDS: HHPT, Walker and 3-in-1 comode seat,Patient will be on by mouth Levaquin for a minimum of 6 weeks and will be following up with infectious diseases Dr. Megan Salon.     [name] 05/17/2019, 8:53 AM

## 2019-05-17 NOTE — Progress Notes (Signed)
D/C Plan: [facility]PT-[facility] Has DME-RW and 3 IN 1

## 2019-05-17 NOTE — Discharge Summary (Signed)
Patient ID: David Lindsey MRN: [id number] DOB/AGE: 1956-03-22 64 y.o.  Admit date: 05/16/2019 Discharge date: 05/17/2019  Admission Diagnoses:  Principal Problem:   Infection of total knee replacement ([place]) Active Problems:   HYPERCHOLESTEROLEMIA   Essential hypertension   Diabetes mellitus with renal manifestation ([place])   History of total right knee replacement (TKR)   Degenerative arthritis of left knee   S/P total knee arthroplasty, left   CKD (chronic kidney disease) stage 3, GFR 30-59 ml/min   Discharge Diagnoses:  Same  Past Medical History:  Diagnosis Date  . ALCOHOLISM 12/22/2007  . ANEMIA, IRON DEFICIENCY 12/26/2007  . Arthritis    OA  . Blood transfusion without reported diagnosis    as a child  . DIABETES MELLITUS, TYPE II 11/10/2006  . ESOPHAGITIS, REFLUX 12/22/2007  . GERD (gastroesophageal reflux disease)   . HYPERCHOLESTEROLEMIA 12/22/2007  . HYPERTENSION 11/10/2006  . HYPOGONADISM 12/22/2007   Prob. due to ETOH  . HYPOKALEMIA 06/06/2008  . Leukopenia   . Neuromuscular disorder ([place])    neuropathy in feet from Diabetes  . Neuropathy    Chronic Neuropathy BOTH FEET  . Pancytopenia 06/06/2008  . RENAL INSUFFICIENCY 11/10/2006   SEES [place] KID[facility] STAGE 2 CKD    Surgeries: Procedure(s): LEFT KNEE IRRIGATION AND DEBRIDEMENT WITH POLY EXCHANGE on 05/16/2019   Consultants:   Discharged Condition: Improved  Hospital Course: David Lindsey is an 64 y.o. male who was admitted 05/16/2019 for operative treatment ofInfection of total knee replacement ([place]). Patient has severe unremitting pain that affects sleep, daily activities, and work/hobbies. After pre-op clearance the patient was taken to the operating room on 05/16/2019 and underwent  Procedure(s): LEFT KNEE IRRIGATION AND DEBRIDEMENT WITH POLY EXCHANGE.    Patient was given perioperative antibiotics:  Anti-infectives (From admission, onward)   Start     Dose/Rate Route Frequency Ordered Stop   05/18/19 0000   levofloxacin (LEVAQUIN) 750 MG tablet     750 mg Oral Every other day 05/17/19 1146     05/16/19 1845  levofloxacin (LEVAQUIN) tablet 750 mg     750 mg Oral Every other day 05/16/19 1840     05/16/19 1800  cefTRIAXone (ROCEPHIN) 2 g in sodium chloride 0.9 % 100 mL IVPB     2 g 200 mL/hr over 30 Minutes Intravenous Every 24 hours 05/16/19 1547     05/16/19 1045  ceFAZolin (ANCEF) 3 g in dextrose 5 % 50 mL IVPB     3 g 100 mL/hr over 30 Minutes Intravenous On call to O.R. 05/16/19 1040 05/16/19 1227       Patient was given sequential compression devices, early ambulation, and chemoprophylaxis to prevent DVT.  Patient benefited maximally from hospital stay and there were no complications.    Recent vital signs:  Patient Vitals for the past 24 hrs:  BP Temp Temp src Pulse Resp SpO2 Height Weight  05/17/19 0925 131/73 98.7 F (37.1 C) Oral (!) 101 -- 99 % -- --  05/17/19 0404 124/74 98.1 F (36.7 C) Oral 84 16 100 % -- --  05/17/19 0138 126/79 98 F (36.7 C) Oral 75 16 100 % -- --  05/16/19 2154 124/79 98.6 F (37 C) Oral 98 16 100 % -- --  05/16/19 1850 -- -- -- (!) 107 -- -- -- --  05/16/19 1844 (!) 150/87 98.6 F (37 C) Oral (!) 110 -- 99 % -- --  05/16/19 1750 135/88 98 F (36.7 C) Oral (!) 108 -- 100 % -- --  05/16/19 1653 (!) 141/82 -- -- 91 -- 100 % -- --  05/16/19 1631 -- -- -- -- -- -- 5\' 11"  (1.803 m) 129.3 kg  05/16/19 1554 (!) 152/91 -- -- 79 18 100 % -- --  05/16/19 1530 116/75 97.6 F (36.4 C) -- 73 15 100 % -- --  05/16/19 1515 (!) 148/88 -- -- 71 13 100 % -- --  05/16/19 1500 139/85 -- -- 75 17 100 % -- --  05/16/19 1445 (!) 142/79 -- -- 70 12 100 % -- --  05/16/19 1430 136/86 -- -- 77 17 100 % -- --  05/16/19 1418 127/78 98.5 F (36.9 C) -- 81 17 100 % -- --  05/16/19 1202 (!) 142/84 -- -- 91 12 100 % -- --     Recent laboratory studies:  Recent Labs    05/15/19 1552 05/15/19 1552 05/16/19 2222 05/17/19 0421  WBC 7.3  --   --  9.4  HGB 12.3*  --   --   10.3*  HCT 38.3*  --   --  32.0*  PLT 494*  --   --  351  NA 134*  --   --  129*  K 4.5  --   --  5.1  CL 97*  --   --  96*  CO2 26  --   --  23  BUN 24*  --   --  23  CREATININE 2.19*  --   --  1.84*  GLUCOSE 168*   < > 385* 246*  INR 1.1  --   --   --   CALCIUM 9.8   < >  --  8.6*   < > = values in this interval not displayed.     Discharge Medications:   Allergies as of 05/17/2019      Reactions   Pioglitazone Swelling      Medication List    STOP taking these medications   acetaminophen 650 MG CR tablet Commonly known as: TYLENOL   cephALEXin 500 MG capsule Commonly known as: KEFLEX   clindamycin 300 MG capsule Commonly known as: Cleocin   HYDROcodone-acetaminophen 5-325 MG tablet Commonly known as: NORCO/VICODIN     TAKE these medications   amLODipine 10 MG tablet Commonly known as: NORVASC Take 1 tablet (10 mg total) by mouth daily.   Apple Cider Vinegar 600 MG Caps Take 600 mg by mouth daily.   aspirin EC 81 MG tablet Take 1 tablet (81 mg total) by mouth 2 (two) times daily. What changed: when to take this   B-12 5000 MCG Caps Take 5,000 mcg by mouth every evening.   cholecalciferol 25 MCG (1000 UNIT) tablet Commonly known as: VITAMIN D3 Take 1,000 Units by mouth daily.   clotrimazole-betamethasone cream Commonly known as: LOTRISONE APPLY  CREAM TOPICALLY TO AFFECTED AREA THREE TIMES DAILY AS NEEDED FOR  RASH What changed:   how much to take  how to take this  when to take this  reasons to take this  additional instructions   CoQ-10 200 MG Caps Take 200 mg by mouth every evening.   docusate sodium 100 MG capsule Commonly known as: COLACE Take 200 mg by mouth every evening.   Easy Touch Pen Needles 31G X 8 MM Misc Generic drug: Insulin Pen Needle USE ONCE DAILY WITH VICTOZA   Farxiga 10 MG Tabs tablet Generic drug: dapagliflozin propanediol Take 10 mg by mouth daily.   furosemide 20 MG tablet Commonly known as: LASIX  Take  20 mg by mouth daily.   gabapentin 300 MG capsule Commonly known as: NEURONTIN Take 1 capsule (300 mg total) by mouth 3 (three) times daily.   HAIR SKIN & NAILS GUMMIES PO Take 1 tablet by mouth every evening.   HM Fiber 500 MG Tabs Generic drug: Methylcellulose (Laxative) Take 500 mg by mouth daily. Fiberwell Gummies   Insulin Lispro Prot & Lispro (75-25) 100 UNIT/ML Kwikpen Commonly known as: HumaLOG Mix 75/25 KwikPen Inject 28 Units into the skin daily with breakfast.   levofloxacin 750 MG tablet Commonly known as: LEVAQUIN Take 1 tablet (750 mg total) by mouth every other day. Start taking on: May 18, 2019   lisinopril 10 MG tablet Commonly known as: ZESTRIL Take 10 mg by mouth daily.   lisinopril-hydrochlorothiazide 10-12.5 MG tablet Commonly known as: ZESTORETIC TAKE ONE-HALF TABLET BY  MOUTH DAILY   LUBRICATING EYE DROPS OP Place 1 drop into both eyes daily as needed (dry eyes).   metoprolol succinate 25 MG 24 hr tablet Commonly known as: TOPROL-XL Take 0.5 tablets (12.5 mg total) by mouth at bedtime.   multivitamin with minerals Tabs tablet Take 1 tablet by mouth every evening.   Omega-3-6-9 Caps Take 1 capsule by mouth every evening. 1600mg    omeprazole 20 MG capsule Commonly known as: PRILOSEC Take 1 capsule (20 mg total) by mouth daily.   ONE TOUCH ULTRA TEST test strip Generic drug: glucose blood Use as instructed check blood sugar two times daily   oxyCODONE-acetaminophen 5-325 MG tablet Commonly known as: PERCOCET/ROXICET Take 1 tablet by mouth every 4 (four) hours as needed for severe pain.   rosuvastatin 5 MG tablet Commonly known as: CRESTOR Take 1 tablet (5 mg total) by mouth daily. What changed: when to take this   sildenafil 100 MG tablet Commonly known as: VIAGRA TAKE 1 TABLET BY MOUTH ONCE DAILY AS NEEDED What changed:   when to take this  reasons to take this   Stopain Roll-On 8 % Liqd Generic drug: Menthol (Topical  Analgesic) Apply 1 application topically 3 (three) times daily as needed (for knee pain.).   tiZANidine 2 MG tablet Commonly known as: ZANAFLEX Take 1 tablet (2 mg total) by mouth every 6 (six) hours as needed.   traZODone 100 MG tablet Commonly known as: DESYREL TAKE 1 TABLET BY MOUTH AT  BEDTIME   trolamine salicylate 10 % cream Commonly known as: ASPERCREME Apply 1 application topically as needed (knee pain).   Victoza 18 MG/3ML Sopn Generic drug: liraglutide Inject 0.3 mLs (1.8 mg total) into the skin every morning.   vitamin C 1000 MG tablet Take 1,000 mg by mouth every evening.   vitamin E 180 MG (400 UNITS) capsule Take 400 Units by mouth every evening.            Durable Medical Equipment  (From admission, onward)         Start     Ordered   05/16/19 1548  DME Walker rolling  Once    Question:  Patient needs a walker to treat with the following condition  Answer:  Status post total left knee replacement   05/16/19 1547   05/16/19 1548  DME 3 n 1  Once     05/16/19 1547           Discharge Care Instructions  (From admission, onward)         Start     Ordered   05/17/19 0000  Weight bearing as  tolerated     05/17/19 1146          Diagnostic Studies: DG Chest 2 View  Result Date: 05/07/2019 CLINICAL DATA:  Pedal edema. EXAM: CHEST - 2 VIEW COMPARISON:  November 22, 2017 FINDINGS: The heart size and mediastinal contours are within normal limits. Both lungs are clear. Degenerative changes seen throughout the thoracic spine. IMPRESSION: No active cardiopulmonary disease. Electronically Signed   By: Virgina Norfolk M.D.   On: 05/07/2019 20:25   DG Knee Complete 4 Views Left  Result Date: 05/07/2019 CLINICAL DATA:  Left knee pain. EXAM: LEFT KNEE - COMPLETE 4+ VIEW COMPARISON:  March 09, 2017 FINDINGS: A left knee replacement is seen without evidence of surrounding lucency to suggest the presence of hardware loosening or infection. There is no  evidence of acute fracture or dislocation. A large joint effusion is seen. There is mild vascular calcification. IMPRESSION: 1. Intact left knee replacement. 2. Large joint effusion. Electronically Signed   By: Virgina Norfolk M.D.   On: 05/07/2019 20:27   VAS [place] LOWER EXTREMITY VENOUS (DVT)  Result Date: 05/04/2019  Lower Venous Study Other Indications: Patient reports swelling in his left lower extremity that has                    been there for a 'long time' but recently has gotten worse                    over the last 2 weeks and particularly the past 3-4 days. The                    calf has become swollen, red and hot compared to the right.                    R/o DVT. Risk Factors: Obesity. Performing Technologist: Mariane Masters RVT  Examination Guidelines: A complete evaluation includes B-mode imaging, spectral Doppler, color Doppler, and power Doppler as needed of all accessible portions of each vessel. Bilateral testing is considered an integral part of a complete examination. Limited examinations for reoccurring indications may be performed as noted.  +-----+---------------+---------+-----------+----------+--------------+ RIGHTCompressibilityPhasicitySpontaneityPropertiesThrombus Aging +-----+---------------+---------+-----------+----------+--------------+ CFV  Full           Yes      Yes                                 +-----+---------------+---------+-----------+----------+--------------+   +---------+---------------+---------+-----------+----------+--------------+ LEFT     CompressibilityPhasicitySpontaneityPropertiesThrombus Aging +---------+---------------+---------+-----------+----------+--------------+ CFV      Full           Yes      Yes                                 +---------+---------------+---------+-----------+----------+--------------+ SFJ      Full           Yes      Yes                                  +---------+---------------+---------+-----------+----------+--------------+ FV Prox  Full           Yes      Yes                                 +---------+---------------+---------+-----------+----------+--------------+  FV Mid   Full           Yes      Yes                                 +---------+---------------+---------+-----------+----------+--------------+ FV DistalFull           Yes      Yes                                 +---------+---------------+---------+-----------+----------+--------------+ PFV      Full                                                        +---------+---------------+---------+-----------+----------+--------------+ POP      Full           Yes      Yes                                 +---------+---------------+---------+-----------+----------+--------------+ PTV                     Yes      Yes                                 +---------+---------------+---------+-----------+----------+--------------+ PERO                    Yes      Yes                                 +---------+---------------+---------+-----------+----------+--------------+ Poor visualization of the calf veins due to significant edema and swelling. The posterior tibial and peroneal veins appear patent via color and spectral Doppler.   Findings reported to nurse Angela Nevin at [facility] at 2:45.  Summary: Right: No evidence of common femoral vein obstruction. Left: No evidence of deep vein thrombosis in the lower extremity. No indirect evidence of obstruction proximal to the inguinal ligament. No cystic structure found in the popliteal fossa.  *See table(s) above for measurements and observations. Electronically signed by Ena Dawley MD on 05/04/2019 at 3:29:02 PM.    Final     Disposition: Discharge disposition: 01-Home or Self Care       Discharge Instructions    Call MD / Call 911   Complete by: As directed    If you experience chest pain  or shortness of breath, CALL 911 and be transported to the hospital emergency room.  If you develope a fever above 101 F, pus (white drainage) or increased drainage or redness at the wound, or calf pain, call your surgeon's office.   Constipation Prevention   Complete by: As directed    Drink plenty of fluids.  Prune juice may be helpful.  You may use a stool softener, such as Colace (over the counter) 100 mg twice a day.  Use MiraLax (over the counter) for constipation as needed.   Diet - low sodium heart healthy   Complete by: As directed  Driving restrictions   Complete by: As directed    No driving for 2 weeks   Increase activity slowly as tolerated   Complete by: As directed    Patient may shower   Complete by: As directed    You may shower without a dressing once there is no drainage.  Do not wash over the wound.  If drainage remains, cover wound with plastic wrap and then shower.   Weight bearing as tolerated   Complete by: As directed       Follow-up Information    Frederik Pear, MD Follow up in 1 week(s).   Specialty: Orthopedic Surgery Contact information: [place] [address] [phone]            Signed: Joanell Rising 05/17/2019, 11:47 AM

## 2019-05-17 NOTE — Progress Notes (Signed)
Pt and pt's Wife provided with d/c instructions. After discussing the pt's plan of care upon d/c home, the pt and pt's Wife denied any further questions or concerns.

## 2019-05-17 NOTE — Progress Notes (Signed)
Patient ID: David Lindsey, male   DOB: 1955-07-31, 64 y.o.   MRN: [id number]         [facility] for Infectious Disease  Date of Admission:  05/16/2019           Day 2 levofloxacin ASSESSMENT: He is doing better following surgical debridement of his left knee.  He has Serratia prosthetic knee infection.  I plan on treating him with 6 weeks of oral, renally adjusted levofloxacin followed by a long course of oral trimethoprim sulfamethoxazole.  PLAN: 1. Discharge on oral levofloxacin 2. Follow-up with me on 06/27/2019  Principal Problem:   Infection of total knee replacement ([facility]) Active Problems:   HYPERCHOLESTEROLEMIA   Essential hypertension   Diabetes mellitus with renal manifestation ([facility])   History of total right knee replacement (TKR)   Degenerative arthritis of left knee   S/P total knee arthroplasty, left   CKD (chronic kidney disease) stage 3, GFR 30-59 ml/min   Scheduled Meds: . vitamin C  1,000 mg Oral QPM  . aspirin  81 mg Oral BID  . canagliflozin  100 mg Oral QAC breakfast  . docusate sodium  200 mg Oral QPM  . furosemide  20 mg Oral Daily  . gabapentin  300 mg Oral TID  . insulin aspart  0-15 Units Subcutaneous TID WC  . insulin aspart protamine- aspart  28 Units Subcutaneous Q breakfast  . levofloxacin  750 mg Oral QODAY  . liraglutide  1.8 mg Subcutaneous BH-[id number]  . lisinopril  10 mg Oral Daily  . metoprolol succinate  12.5 mg Oral QHS  . pantoprazole  40 mg Oral Daily  . traZODone  100 mg Oral QHS   Continuous Infusions: . cefTRIAXone (ROCEPHIN)  IV 2 g (05/16/19 1740)  . dextrose 5 % and 0.45 % NaCl with KCl 20 mEq/L Stopped (05/16/19 2305)  . methocarbamol (ROBAXIN) IV     PRN Meds:.acetaminophen, alum & mag hydroxide-simeth, bisacodyl, diphenhydrAMINE, HYDROmorphone (DILAUDID) injection, menthol-cetylpyridinium **OR** phenol, methocarbamol **OR** methocarbamol (ROBAXIN) IV, metoCLOPramide **OR** metoCLOPramide (REGLAN) injection, ondansetron  **OR** ondansetron (ZOFRAN) IV, oxyCODONE, polyethylene glycol, polyvinyl alcohol, sodium phosphate   SUBJECTIVE: He is feeling better and hoping to go home this afternoon.  Review of Systems: Review of Systems  Constitutional: Negative for fever.  Gastrointestinal: Negative for nausea and vomiting.  Musculoskeletal: Positive for joint pain.    Allergies  Allergen Reactions  . Pioglitazone Swelling    OBJECTIVE: Vitals:   05/16/19 2154 05/17/19 0138 05/17/19 0404 05/17/19 0925  BP: 124/79 126/79 124/74 131/73  Pulse: 98 75 84 (!) 101  Resp: 16 16 16    Temp: 98.6 F (37 C) 98 F (36.7 C) 98.1 F (36.7 C) 98.7 F (37.1 C)  TempSrc: Oral Oral Oral Oral  SpO2: 100% 100% 100% 99%  Weight:      Height:       Body mass index is 39.75 kg/m.  Physical Exam Constitutional:      Comments: He is pleasant and in good spirits.  He is sitting up in a chair.  Musculoskeletal:     Comments: His left knee is wrapped.  His surgical drain has been removed.  Psychiatric:        Mood and Affect: Mood normal.     Lab Results Lab Results  Component Value Date   WBC 9.4 05/17/2019   HGB 10.3 (L) 05/17/2019   HCT 32.0 (L) 05/17/2019   MCV 88.6 05/17/2019   PLT 351 05/17/2019    Lab Results  Component Value Date   CREATININE 1.84 (H) 05/17/2019   BUN 23 05/17/2019   NA 129 (L) 05/17/2019   K 5.1 05/17/2019   CL 96 (L) 05/17/2019   CO2 23 05/17/2019    Lab Results  Component Value Date   ALT 57 (H) 05/10/2019   AST 32 05/10/2019   ALKPHOS 61 05/10/2019   BILITOT 0.5 05/10/2019     Microbiology: Recent Results (from the past 240 hour(s))  Anaerobic and Aerobic Culture     Status: Abnormal   Collection Time: 05/08/19  4:09 PM  Result Value Ref Range Status   Source: KNEE, LEFT  Final   Status: FINAL  Final   Gram Stain Few Polymorphonuclear leukocytes No organisms seen  Final   ANA RESULT: No anaerobes isolated.  Final   Source: KNEE, LEFT  Final   Status: FINAL   Final   Isolate 1: Serratia marcescens (A)  Final    Comment: Light growth of Serratia marcescens      Susceptibility   Serratia marcescens - AEROBIC BACTERIA, CULT NEGATIVE 1    AMOX/CLAVULANIC >=32 Resistant     CEFAZOLIN* >=64 Resistant      * For infections other than uncomplicated UTIcaused by E. coli, K. pneumoniae or P. mirabilis:Cefazolin is resistant if MIC > or = 8 mcg/mL.(Distinguishing susceptible versus intermediatefor isolates with MIC < or = 4 mcg/mL requiresadditional testing.)    CEFEPIME <=1 Sensitive     CEFTRIAXONE <=1 Sensitive     CIPROFLOXACIN <=0.25 Sensitive     LEVOFLOXACIN <=0.12 Sensitive     ERTAPENEM <=0.5 Sensitive     GENTAMICIN <=1 Sensitive     TOBRAMYCIN <=1 Sensitive     TRIMETH/SULFA* <=20 Sensitive      * For infections other than uncomplicated UTIcaused by E. coli, K. pneumoniae or P. mirabilis:Cefazolin is resistant if MIC > or = 8 mcg/mL.(Distinguishing susceptible versus intermediatefor isolates with MIC < or = 4 mcg/mL requiresadditional testing.)Legend:S = Susceptible  I = IntermediateR = Resistant  NS = Not susceptible* = Not tested  NR = Not reported**NN = See antimicrobic comments  SARS CORONAVIRUS 2 (TAT 6-24 HRS) Nasopharyngeal Nasopharyngeal Swab     Status: None   Collection Time: 05/15/19  3:04 PM   Specimen: Nasopharyngeal Swab  Result Value Ref Range Status   SARS Coronavirus 2 NEGATIVE NEGATIVE Final    Comment: (NOTE) SARS-CoV-2 target nucleic acids are NOT DETECTED. The SARS-CoV-2 RNA is generally detectable in upper and lower respiratory specimens during the acute phase of infection. Negative results do not preclude SARS-CoV-2 infection, do not rule out co-infections with other pathogens, and should not be used as the sole basis for treatment or other patient management decisions. Negative results must be combined with clinical observations, patient history, and epidemiological information. The expected result is Negative. Fact  Sheet for Patients: SugarRoll.be Fact Sheet for Healthcare Providers: https://www.woods-mathews.com/ This test is not yet approved or cleared by the [place] FDA and  has been authorized for detection and/or diagnosis of SARS-CoV-2 by FDA under an Emergency Use Authorization (EUA). This EUA will remain  in effect (meaning this test can be used) for the duration of the COVID-19 declaration under Section 56 4(b)(1) of the Act, 21 U.S.C. section 360bbb-3(b)(1), unless the authorization is terminated or revoked sooner. Performed at [facility], [place] [address] [place], [place] [address]     Michel Bickers, [place] [facility] Infectious [facility] [place] [facility] [phone] pager  [phone] cell 05/17/2019, 11:44 AM

## 2019-05-17 NOTE — Progress Notes (Addendum)
Physical Therapy Treatment Patient Details Name: David Lindsey MRN: [id number] DOB: April 12, 1956 Today's Date: 05/17/2019    History of Present Illness Patient is 64 y.o. male s/p Lt TKR (I&D with poly-exchange) on 05/16/19 with PMH significant for bil TKA, DM, peripheral neuropathy, GERD, HTN, and OA.    PT Comments    Patient seen for additional therapy session to review safe gait and stair mobility with spouse. Pt continued with good carryover for safe hand placement and technique for transfers and gait with RW. Pt's spouse demonstrated safe guarding position for gait and pt maintained safe proximity to RW throughout. Patients was able to instruct spouse through ~75% of stair mobility with minimal cues from therapist and pt's wife was able to stabilize walker well. He is safe for discharge home at this time.   Follow Up Recommendations  Follow surgeon's recommendation for DC plan and follow-up therapies     Equipment Recommendations  None recommended by PT    Recommendations for Other Services       Precautions / Restrictions Precautions Precautions: Fall Restrictions Weight Bearing Restrictions: No    Mobility  Bed Mobility Overal bed mobility: Needs Assistance Bed Mobility: Supine to Sit     Supine to sit: HOB elevated;Supervision     General bed mobility comments: Pt OOB in recliner at start of session  Transfers Overall transfer level: Needs assistance Equipment used: Rolling walker (2 wheeled) Transfers: Sit to/from Stand Sit to Stand: Min guard         General transfer comment: pt perfromed sit<>stand with good technique and min guard from recliner  Ambulation/Gait Ambulation/Gait assistance: Min guard;Supervision Gait Distance (Feet): 50 Feet Assistive device: Rolling walker (2 wheeled) Gait Pattern/deviations: Step-to pattern;Decreased stride length;Decreased stance time - left;Decreased step length - right;Wide base of support Gait velocity: decreased   General Gait Details: pt's wife educated on safe gaurding position and demonstrated while pt ambulating in hallway and room, no overt LOB noted, pt maintained safe proximity to RW throughout   Stairs Stairs: Yes Stairs assistance: Min assist;Min guard Stair Management: No rails;With walker;Backwards Number of Stairs: 2 General stair comments: pt provided instructions with min cues from therapist for safe step sequencing and safe guarding for pt's wife to provide. Pt's wife able to stabilize RW and pt performed safe step sequencing without difficulty.   Wheelchair Mobility    Modified Rankin (Stroke Patients Only)       Balance Overall balance assessment: Needs assistance Sitting-balance support: Feet unsupported;Feet supported Sitting balance-Leahy Scale: Good     Standing balance support: During functional activity;Bilateral upper extremity supported Standing balance-Leahy Scale: Poor           Cognition Arousal/Alertness: Awake/alert Behavior During Therapy: WFL for tasks assessed/performed Overall Cognitive Status: Within Functional Limits for tasks assessed           Exercises    General Comments        Pertinent Vitals/Pain Pain Assessment: 0-10 Pain Score: 5  Pain Location: Lt knee Pain Descriptors / Indicators: Aching;Sore Pain Intervention(s): Limited activity within patient's tolerance;Monitored during session;Repositioned           PT Goals (current goals can now be found in the care plan section) Acute Rehab PT Goals Patient Stated Goal: to improve independence with walking PT Goal Formulation: With patient Time For Goal Achievement: 05/23/19 Potential to Achieve Goals: Good Progress towards PT goals: Progressing toward goals    Frequency    7X/week      PT Plan  Current plan remains appropriate       AM-PAC PT "6 Clicks" Mobility   Outcome Measure  Help needed turning from your back to your side while in a flat bed without  using bedrails?: A Little Help needed moving from lying on your back to sitting on the side of a flat bed without using bedrails?: A Little Help needed moving to and from a bed to a chair (including a wheelchair)?: A Little Help needed standing up from a chair using your arms (e.g., wheelchair or bedside chair)?: A Little Help needed to walk in hospital room?: A Little Help needed climbing 3-5 steps with a railing? : A Little 6 Click Score: 18    End of Session Equipment Utilized During Treatment: Gait belt Activity Tolerance: Patient tolerated treatment well Patient left: in chair;with call bell/phone within reach;with chair alarm set Nurse Communication: Mobility status PT Visit Diagnosis: Muscle weakness (generalized) (M62.81);Difficulty in walking, not elsewhere classified (R26.2)     Time: [id number] PT Time Calculation (min) (ACUTE ONLY): 14 min  Charges:  $Gait Training: 8-22 mins                     Verner Mould, DPT Physical Therapist with [facility] [phone]  05/17/2019 1:28 PM

## 2019-05-17 NOTE — Progress Notes (Signed)
Physical Therapy Treatment Patient Details Name: David Lindsey MRN: [id number] DOB: [birth year] Today's Date: 05/17/2019    History of Present Illness Patient is 64 y.o. male s/p Lt TKR (I&D with poly-exchange) on 05/16/19 with PMH significant for bil TKA, DM, peripheral neuropathy, GERD, HTN, and OA.    PT Comments    Patient seen POD1 from Lt TKR. He remains limited with Lt quadriceps strength and knee immobilizer maintained for mobility. Pt educated on use of gait belt to assist with Lt LE mobility and required supervision only for bed mob. Min assist for sit<>stand required from low surfaces and min guard from elevated; pt with good hand placement and technique on RW. Gait progress to increased distance and pt educated on safe stair mobility for reverse step up technique with RW. HEP reviewed for pt and per instructions of MD all exercises with knee flexion to be avoided until cleared by MD. He will benefit from additional follow up session with his wife to review safe guarding and stair mobility.    Follow Up Recommendations  Follow surgeon's recommendation for DC plan and follow-up therapies     Equipment Recommendations  None recommended by PT    Recommendations for Other Services       Precautions / Restrictions Precautions Precautions: Fall Restrictions Weight Bearing Restrictions: No    Mobility  Bed Mobility Overal bed mobility: Needs Assistance Bed Mobility: Supine to Sit     Supine to sit: HOB elevated;Supervision     General bed mobility comments: cues for use of gait belt to assist with Lt LE mobility, pt using bed rail and requried extra time/effort, no assist required  Transfers Overall transfer level: Needs assistance Equipment used: Rolling walker (2 wheeled) Transfers: Sit to/from Stand Sit to Stand: From elevated surface;Min guard;Min assist         General transfer comment: pt with good carryover for hand placement and technique with RW, min assist  required from low surface, min guard from elevated surface.  Ambulation/Gait Ambulation/Gait assistance: Min guard;Supervision Gait Distance (Feet): 110 Feet Assistive device: Rolling walker (2 wheeled) Gait Pattern/deviations: Step-to pattern;Decreased stride length;Decreased stance time - left;Decreased step length - right;Wide base of support Gait velocity: decreased   General Gait Details: pt with good carryover for safe hand placement on RW, no overt LOB noted, pt in knee immobilizer on Lt due to Lt quad weakness   Stairs Stairs: Yes Stairs assistance: Min assist Stair Management: No rails;With walker;Backwards Number of Stairs: 3(1x2 1x1) General stair comments: cues for backwards step pattern "up with good, down with bad" no overt LOB, therapist assisted with RW stability and cues for pt.   Wheelchair Mobility    Modified Rankin (Stroke Patients Only)       Balance Overall balance assessment: Needs assistance Sitting-balance support: Feet unsupported;Feet supported Sitting balance-Leahy Scale: Good     Standing balance support: During functional activity;Bilateral upper extremity supported Standing balance-Leahy Scale: Poor             Cognition Arousal/Alertness: Awake/alert Behavior During Therapy: WFL for tasks assessed/performed Overall Cognitive Status: Within Functional Limits for tasks assessed             Exercises Total Joint Exercises Ankle Circles/Pumps: AROM;Seated;Both;10 reps Quad Sets: AROM;5 reps;Seated;Left Short Arc Quad: AROM;5 reps;Seated;Right Hip ABduction/ADduction: AROM;5 reps;Seated;Right    General Comments        Pertinent Vitals/Pain Pain Assessment: 0-10 Pain Score: 5  Pain Location: Lt knee Pain Descriptors / Indicators: Aching;Sore Pain Intervention(s):  Limited activity within patient's tolerance;Monitored during session;Repositioned           PT Goals (current goals can now be found in the care plan section)  Acute Rehab PT Goals Patient Stated Goal: to improve independence with walking PT Goal Formulation: With patient Time For Goal Achievement: 05/23/19 Potential to Achieve Goals: Good Progress towards PT goals: Progressing toward goals    Frequency    7X/week      PT Plan Current plan remains appropriate       AM-PAC PT "6 Clicks" Mobility   Outcome Measure  Help needed turning from your back to your side while in a flat bed without using bedrails?: A Little Help needed moving from lying on your back to sitting on the side of a flat bed without using bedrails?: A Little Help needed moving to and from a bed to a chair (including a wheelchair)?: A Little Help needed standing up from a chair using your arms (e.g., wheelchair or bedside chair)?: A Little Help needed to walk in hospital room?: A Little Help needed climbing 3-5 steps with a railing? : A Little 6 Click Score: 18    End of Session Equipment Utilized During Treatment: Gait belt Activity Tolerance: Patient tolerated treatment well Patient left: in chair;with call bell/phone within reach;with chair alarm set Nurse Communication: Mobility status PT Visit Diagnosis: Muscle weakness (generalized) (M62.81);Difficulty in walking, not elsewhere classified (R26.2)     Time: [id number] PT Time Calculation (min) (ACUTE ONLY): 29 min  Charges:  $Gait Training: 8-22 mins $Therapeutic Exercise: 8-22 mins                     Verner Mould, DPT Physical Therapist with [facility] [phone]  05/17/2019 1:13 PM

## 2019-05-18 DIAGNOSIS — Z79899 Other long term (current) drug therapy: Secondary | ICD-10-CM | POA: Diagnosis not present

## 2019-05-18 DIAGNOSIS — E1122 Type 2 diabetes mellitus with diabetic chronic kidney disease: Secondary | ICD-10-CM | POA: Diagnosis not present

## 2019-05-18 DIAGNOSIS — K219 Gastro-esophageal reflux disease without esophagitis: Secondary | ICD-10-CM | POA: Diagnosis not present

## 2019-05-18 DIAGNOSIS — Z96651 Presence of right artificial knee joint: Secondary | ICD-10-CM | POA: Diagnosis not present

## 2019-05-18 DIAGNOSIS — T8454XA Infection and inflammatory reaction due to internal left knee prosthesis, initial encounter: Secondary | ICD-10-CM | POA: Diagnosis not present

## 2019-05-18 DIAGNOSIS — I129 Hypertensive chronic kidney disease with stage 1 through stage 4 chronic kidney disease, or unspecified chronic kidney disease: Secondary | ICD-10-CM | POA: Diagnosis not present

## 2019-05-18 DIAGNOSIS — Z794 Long term (current) use of insulin: Secondary | ICD-10-CM | POA: Diagnosis not present

## 2019-05-18 DIAGNOSIS — D631 Anemia in chronic kidney disease: Secondary | ICD-10-CM | POA: Diagnosis not present

## 2019-05-18 DIAGNOSIS — E7800 Pure hypercholesterolemia, unspecified: Secondary | ICD-10-CM | POA: Diagnosis not present

## 2019-05-18 DIAGNOSIS — N183 Chronic kidney disease, stage 3 unspecified: Secondary | ICD-10-CM | POA: Diagnosis not present

## 2019-05-18 DIAGNOSIS — E1142 Type 2 diabetes mellitus with diabetic polyneuropathy: Secondary | ICD-10-CM | POA: Diagnosis not present

## 2019-05-22 ENCOUNTER — Other Ambulatory Visit: Payer: Self-pay | Admitting: Family

## 2019-05-22 DIAGNOSIS — E1122 Type 2 diabetes mellitus with diabetic chronic kidney disease: Secondary | ICD-10-CM | POA: Diagnosis not present

## 2019-05-22 DIAGNOSIS — E7800 Pure hypercholesterolemia, unspecified: Secondary | ICD-10-CM | POA: Diagnosis not present

## 2019-05-22 DIAGNOSIS — Z96651 Presence of right artificial knee joint: Secondary | ICD-10-CM | POA: Diagnosis not present

## 2019-05-22 DIAGNOSIS — D631 Anemia in chronic kidney disease: Secondary | ICD-10-CM | POA: Diagnosis not present

## 2019-05-22 DIAGNOSIS — Z79899 Other long term (current) drug therapy: Secondary | ICD-10-CM | POA: Diagnosis not present

## 2019-05-22 DIAGNOSIS — T8454XA Infection and inflammatory reaction due to internal left knee prosthesis, initial encounter: Secondary | ICD-10-CM | POA: Diagnosis not present

## 2019-05-22 DIAGNOSIS — K219 Gastro-esophageal reflux disease without esophagitis: Secondary | ICD-10-CM | POA: Diagnosis not present

## 2019-05-22 DIAGNOSIS — Z794 Long term (current) use of insulin: Secondary | ICD-10-CM | POA: Diagnosis not present

## 2019-05-22 DIAGNOSIS — E1142 Type 2 diabetes mellitus with diabetic polyneuropathy: Secondary | ICD-10-CM | POA: Diagnosis not present

## 2019-05-22 DIAGNOSIS — N183 Chronic kidney disease, stage 3 unspecified: Secondary | ICD-10-CM | POA: Diagnosis not present

## 2019-05-22 DIAGNOSIS — I129 Hypertensive chronic kidney disease with stage 1 through stage 4 chronic kidney disease, or unspecified chronic kidney disease: Secondary | ICD-10-CM | POA: Diagnosis not present

## 2019-05-24 DIAGNOSIS — K219 Gastro-esophageal reflux disease without esophagitis: Secondary | ICD-10-CM | POA: Diagnosis not present

## 2019-05-24 DIAGNOSIS — N183 Chronic kidney disease, stage 3 unspecified: Secondary | ICD-10-CM | POA: Diagnosis not present

## 2019-05-24 DIAGNOSIS — E1122 Type 2 diabetes mellitus with diabetic chronic kidney disease: Secondary | ICD-10-CM | POA: Diagnosis not present

## 2019-05-24 DIAGNOSIS — E7800 Pure hypercholesterolemia, unspecified: Secondary | ICD-10-CM | POA: Diagnosis not present

## 2019-05-24 DIAGNOSIS — T8454XA Infection and inflammatory reaction due to internal left knee prosthesis, initial encounter: Secondary | ICD-10-CM | POA: Diagnosis not present

## 2019-05-24 DIAGNOSIS — Z96651 Presence of right artificial knee joint: Secondary | ICD-10-CM | POA: Diagnosis not present

## 2019-05-24 DIAGNOSIS — I129 Hypertensive chronic kidney disease with stage 1 through stage 4 chronic kidney disease, or unspecified chronic kidney disease: Secondary | ICD-10-CM | POA: Diagnosis not present

## 2019-05-24 DIAGNOSIS — Z79899 Other long term (current) drug therapy: Secondary | ICD-10-CM | POA: Diagnosis not present

## 2019-05-24 DIAGNOSIS — E1142 Type 2 diabetes mellitus with diabetic polyneuropathy: Secondary | ICD-10-CM | POA: Diagnosis not present

## 2019-05-24 DIAGNOSIS — Z794 Long term (current) use of insulin: Secondary | ICD-10-CM | POA: Diagnosis not present

## 2019-05-24 DIAGNOSIS — D631 Anemia in chronic kidney disease: Secondary | ICD-10-CM | POA: Diagnosis not present

## 2019-05-26 ENCOUNTER — Other Ambulatory Visit: Payer: Self-pay | Admitting: Endocrinology

## 2019-05-27 ENCOUNTER — Other Ambulatory Visit: Payer: Self-pay | Admitting: Family

## 2019-05-28 DIAGNOSIS — E1122 Type 2 diabetes mellitus with diabetic chronic kidney disease: Secondary | ICD-10-CM | POA: Diagnosis not present

## 2019-05-28 DIAGNOSIS — E1142 Type 2 diabetes mellitus with diabetic polyneuropathy: Secondary | ICD-10-CM | POA: Diagnosis not present

## 2019-05-28 DIAGNOSIS — N183 Chronic kidney disease, stage 3 unspecified: Secondary | ICD-10-CM | POA: Diagnosis not present

## 2019-05-28 DIAGNOSIS — D631 Anemia in chronic kidney disease: Secondary | ICD-10-CM | POA: Diagnosis not present

## 2019-05-28 DIAGNOSIS — Z79899 Other long term (current) drug therapy: Secondary | ICD-10-CM | POA: Diagnosis not present

## 2019-05-28 DIAGNOSIS — E7800 Pure hypercholesterolemia, unspecified: Secondary | ICD-10-CM | POA: Diagnosis not present

## 2019-05-28 DIAGNOSIS — I129 Hypertensive chronic kidney disease with stage 1 through stage 4 chronic kidney disease, or unspecified chronic kidney disease: Secondary | ICD-10-CM | POA: Diagnosis not present

## 2019-05-28 DIAGNOSIS — T8454XA Infection and inflammatory reaction due to internal left knee prosthesis, initial encounter: Secondary | ICD-10-CM | POA: Diagnosis not present

## 2019-05-28 DIAGNOSIS — K219 Gastro-esophageal reflux disease without esophagitis: Secondary | ICD-10-CM | POA: Diagnosis not present

## 2019-05-28 DIAGNOSIS — Z96651 Presence of right artificial knee joint: Secondary | ICD-10-CM | POA: Diagnosis not present

## 2019-05-28 DIAGNOSIS — Z794 Long term (current) use of insulin: Secondary | ICD-10-CM | POA: Diagnosis not present

## 2019-05-29 DIAGNOSIS — T8454XA Infection and inflammatory reaction due to internal left knee prosthesis, initial encounter: Secondary | ICD-10-CM | POA: Diagnosis not present

## 2019-05-30 DIAGNOSIS — E1142 Type 2 diabetes mellitus with diabetic polyneuropathy: Secondary | ICD-10-CM | POA: Diagnosis not present

## 2019-05-30 DIAGNOSIS — I129 Hypertensive chronic kidney disease with stage 1 through stage 4 chronic kidney disease, or unspecified chronic kidney disease: Secondary | ICD-10-CM | POA: Diagnosis not present

## 2019-05-30 DIAGNOSIS — Z794 Long term (current) use of insulin: Secondary | ICD-10-CM | POA: Diagnosis not present

## 2019-05-30 DIAGNOSIS — Z79899 Other long term (current) drug therapy: Secondary | ICD-10-CM | POA: Diagnosis not present

## 2019-05-30 DIAGNOSIS — Z96651 Presence of right artificial knee joint: Secondary | ICD-10-CM | POA: Diagnosis not present

## 2019-05-30 DIAGNOSIS — T8454XA Infection and inflammatory reaction due to internal left knee prosthesis, initial encounter: Secondary | ICD-10-CM | POA: Diagnosis not present

## 2019-05-30 DIAGNOSIS — E7800 Pure hypercholesterolemia, unspecified: Secondary | ICD-10-CM | POA: Diagnosis not present

## 2019-05-30 DIAGNOSIS — N183 Chronic kidney disease, stage 3 unspecified: Secondary | ICD-10-CM | POA: Diagnosis not present

## 2019-05-30 DIAGNOSIS — D631 Anemia in chronic kidney disease: Secondary | ICD-10-CM | POA: Diagnosis not present

## 2019-05-30 DIAGNOSIS — K219 Gastro-esophageal reflux disease without esophagitis: Secondary | ICD-10-CM | POA: Diagnosis not present

## 2019-05-30 DIAGNOSIS — E1122 Type 2 diabetes mellitus with diabetic chronic kidney disease: Secondary | ICD-10-CM | POA: Diagnosis not present

## 2019-06-04 DIAGNOSIS — T8454XA Infection and inflammatory reaction due to internal left knee prosthesis, initial encounter: Secondary | ICD-10-CM | POA: Diagnosis not present

## 2019-06-04 DIAGNOSIS — Z794 Long term (current) use of insulin: Secondary | ICD-10-CM | POA: Diagnosis not present

## 2019-06-04 DIAGNOSIS — E1122 Type 2 diabetes mellitus with diabetic chronic kidney disease: Secondary | ICD-10-CM | POA: Diagnosis not present

## 2019-06-04 DIAGNOSIS — N183 Chronic kidney disease, stage 3 unspecified: Secondary | ICD-10-CM | POA: Diagnosis not present

## 2019-06-04 DIAGNOSIS — Z96651 Presence of right artificial knee joint: Secondary | ICD-10-CM | POA: Diagnosis not present

## 2019-06-04 DIAGNOSIS — K219 Gastro-esophageal reflux disease without esophagitis: Secondary | ICD-10-CM | POA: Diagnosis not present

## 2019-06-04 DIAGNOSIS — Z79899 Other long term (current) drug therapy: Secondary | ICD-10-CM | POA: Diagnosis not present

## 2019-06-04 DIAGNOSIS — I129 Hypertensive chronic kidney disease with stage 1 through stage 4 chronic kidney disease, or unspecified chronic kidney disease: Secondary | ICD-10-CM | POA: Diagnosis not present

## 2019-06-04 DIAGNOSIS — D631 Anemia in chronic kidney disease: Secondary | ICD-10-CM | POA: Diagnosis not present

## 2019-06-04 DIAGNOSIS — E7800 Pure hypercholesterolemia, unspecified: Secondary | ICD-10-CM | POA: Diagnosis not present

## 2019-06-04 DIAGNOSIS — E1142 Type 2 diabetes mellitus with diabetic polyneuropathy: Secondary | ICD-10-CM | POA: Diagnosis not present

## 2019-06-06 DIAGNOSIS — Z96651 Presence of right artificial knee joint: Secondary | ICD-10-CM | POA: Diagnosis not present

## 2019-06-06 DIAGNOSIS — Z79899 Other long term (current) drug therapy: Secondary | ICD-10-CM | POA: Diagnosis not present

## 2019-06-06 DIAGNOSIS — E1122 Type 2 diabetes mellitus with diabetic chronic kidney disease: Secondary | ICD-10-CM | POA: Diagnosis not present

## 2019-06-06 DIAGNOSIS — Z794 Long term (current) use of insulin: Secondary | ICD-10-CM | POA: Diagnosis not present

## 2019-06-06 DIAGNOSIS — E7800 Pure hypercholesterolemia, unspecified: Secondary | ICD-10-CM | POA: Diagnosis not present

## 2019-06-06 DIAGNOSIS — E1142 Type 2 diabetes mellitus with diabetic polyneuropathy: Secondary | ICD-10-CM | POA: Diagnosis not present

## 2019-06-06 DIAGNOSIS — I129 Hypertensive chronic kidney disease with stage 1 through stage 4 chronic kidney disease, or unspecified chronic kidney disease: Secondary | ICD-10-CM | POA: Diagnosis not present

## 2019-06-06 DIAGNOSIS — K219 Gastro-esophageal reflux disease without esophagitis: Secondary | ICD-10-CM | POA: Diagnosis not present

## 2019-06-06 DIAGNOSIS — T8454XA Infection and inflammatory reaction due to internal left knee prosthesis, initial encounter: Secondary | ICD-10-CM | POA: Diagnosis not present

## 2019-06-06 DIAGNOSIS — D631 Anemia in chronic kidney disease: Secondary | ICD-10-CM | POA: Diagnosis not present

## 2019-06-06 DIAGNOSIS — N183 Chronic kidney disease, stage 3 unspecified: Secondary | ICD-10-CM | POA: Diagnosis not present

## 2019-06-07 ENCOUNTER — Other Ambulatory Visit: Payer: Self-pay | Admitting: Family

## 2019-06-07 ENCOUNTER — Telehealth: Payer: Self-pay

## 2019-06-07 NOTE — Telephone Encounter (Signed)
Medication Requested: furosemide (LASIX) 20 MG tablet  Is medication on med list: Yes  (if no, inform pt they may need an appointment)  Is medication a controled: No (yes = last OV with PCP)  -Controlled Substances: Adderall, Ritalin, oxycodone, hydrocodone, methadone, alprazolam, etc  Last visit with PCP: 1.25.2021  Is the OV > than 4 months: (yes = schedule an appt if one is not already made)  Pharmacy (Name, [place]): [facility] on [address] - asking for 90 day supply

## 2019-06-08 NOTE — Telephone Encounter (Signed)
We are not going to refill the Lasix then; He will need to check with his kidney doctor if he wants it refilled.

## 2019-06-08 NOTE — Telephone Encounter (Signed)
He mentioned that his nephrologist had changed his blood pressure medications. What is he now taking? The Lasix may not be something we want to continue long term to protect his kidneys.

## 2019-06-08 NOTE — Telephone Encounter (Signed)
Spoke with patient and info given. He will contact kidney doctor to discuss need for Lasix.

## 2019-06-21 ENCOUNTER — Other Ambulatory Visit: Payer: Self-pay | Admitting: Family

## 2019-06-21 DIAGNOSIS — E291 Testicular hypofunction: Secondary | ICD-10-CM

## 2019-06-27 ENCOUNTER — Ambulatory Visit (INDEPENDENT_AMBULATORY_CARE_PROVIDER_SITE_OTHER): Payer: Medicare Other | Admitting: Internal Medicine

## 2019-06-27 ENCOUNTER — Encounter: Payer: Self-pay | Admitting: Internal Medicine

## 2019-06-27 ENCOUNTER — Other Ambulatory Visit: Payer: Self-pay

## 2019-06-27 DIAGNOSIS — T8459XD Infection and inflammatory reaction due to other internal joint prosthesis, subsequent encounter: Secondary | ICD-10-CM | POA: Diagnosis not present

## 2019-06-27 DIAGNOSIS — Z96659 Presence of unspecified artificial knee joint: Secondary | ICD-10-CM

## 2019-06-27 MED ORDER — SULFAMETHOXAZOLE-TRIMETHOPRIM 400-80 MG PO TABS: 1.0000 | ORAL_TABLET | Freq: Two times a day (BID) | ORAL | 4 refills | Status: DC

## 2019-06-27 NOTE — Progress Notes (Signed)
[facility] for Infectious Disease  Patient Active Problem List   Diagnosis Date Noted  . 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 CKD (chronic kidney disease) stage 3, GFR 30-59 ml/min 05/16/2019  . 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 Degenerative arthritis of left knee 11/18/2017  . Left shoulder pain 08/29/2017  . History of total right knee replacement (TKR) 03/25/2017  . Arthralgia 02/24/2017  . Edema 01/13/2017  . Wellness examination 01/30/2015  . Myalgia and myositis 01/28/2014  . Unspecified constipation 03/13/2013  . Screening for prostate cancer 01/18/2013  . Diabetes mellitus with renal manifestation ([place]) 01/18/2013  . Encounter for long-term (current) use of other medications 03/24/2012  . HYPOKALEMIA 06/06/2008  . Pancytopenia ([place]) 06/06/2008  . Hypogonadism, male 12/22/2007  . HYPERCHOLESTEROLEMIA 12/22/2007  . ALCOHOLISM 12/22/2007  . ESOPHAGITIS, REFLUX 12/22/2007  . Leg pain 12/22/2007  . Essential hypertension 11/10/2006  . Disorder resulting from impaired renal function 11/10/2006    Patient's Medications  New Prescriptions   SULFAMETHOXAZOLE-TRIMETHOPRIM (BACTRIM) 400-80 MG TABLET    Take 1 tablet by mouth 2 (two) times daily.  Previous Medications   AMLODIPINE (NORVASC) 10 MG TABLET    TAKE 1 TABLET BY MOUTH EVERY DAY   APPLE CIDER VINEGAR 600 MG CAPS    Take 600 mg by mouth daily.   ASCORBIC ACID (VITAMIN C) 1000 MG TABLET    Take 1,000 mg by mouth every evening.   ASPIRIN EC 81 MG TABLET    Take 1 tablet (81 mg total) by mouth 2 (two) times daily.   BIOTIN W/ VITAMINS C & E (HAIR SKIN & NAILS GUMMIES PO)    Take 1 tablet by mouth every evening.   CARBOXYMETHYLCELLUL-GLYCERIN (LUBRICATING EYE DROPS OP)    Place 1 drop into both eyes daily as needed (dry eyes).   CHOLECALCIFEROL (VITAMIN D3) 25 MCG (1000 UNIT) TABLET    Take 1,000 Units by mouth daily.   CLOTRIMAZOLE-BETAMETHASONE  (LOTRISONE) CREAM    APPLY  CREAM TOPICALLY TO AFFECTED AREA THREE TIMES DAILY AS NEEDED FOR  RASH   COENZYME Q10 (COQ-10) 200 MG CAPS    Take 200 mg by mouth every evening.    CYANOCOBALAMIN (B-12) 5000 MCG CAPS    Take 5,000 mcg by mouth every evening.    DAPAGLIFLOZIN PROPANEDIOL (FARXIGA) 10 MG TABS TABLET    Take 10 mg by mouth daily.   DOCUSATE SODIUM (COLACE) 100 MG CAPSULE    Take 200 mg by mouth every evening.   GABAPENTIN (NEURONTIN) 300 MG CAPSULE    TAKE 1 CAPSULE BY MOUTH 3  TIMES DAILY   GLUCOSE BLOOD (ONE TOUCH ULTRA TEST) TEST STRIP    Use as instructed check blood sugar two times daily    INSULIN LISPRO PROT & LISPRO (HUMALOG MIX 75/25 KWIKPEN) (75-25) 100 UNIT/ML KWIKPEN    Inject 28 Units into the skin daily with breakfast.   LEVOFLOXACIN (LEVAQUIN) 750 MG TABLET    Take 1 tablet (750 mg total) by mouth every other day.   LIRAGLUTIDE (VICTOZA) 18 MG/3ML SOPN    Inject 0.3 mLs (1.8 mg total) into the skin every morning.   LISINOPRIL (ZESTRIL) 10 MG TABLET    Take 10 mg by mouth daily.   MENTHOL, TOPICAL ANALGESIC, (STOPAIN ROLL-ON) 8 % LIQD    Apply 1 application topically 3 (three) times daily as needed (for knee pain.).   METHYLCELLULOSE, LAXATIVE, (HM  FIBER) 500 MG TABS    Take 500 mg by mouth daily. Fiberwell Gummies    METOPROLOL SUCCINATE (TOPROL-XL) 25 MG 24 HR TABLET    TAKE 1/2 (ONE-HALF) TABLET BY MOUTH AT BEDTIME   MM PEN NEEDLES 31G X 8 MM MISC    USE AS DIRECTED WITH  VICTOZA   MULTIPLE VITAMIN (MULTIVITAMIN WITH MINERALS) TABS TABLET    Take 1 tablet by mouth every evening.    OMEGA-3-6-9 CAPS    Take 1 capsule by mouth every evening. 1600mg    OMEPRAZOLE (PRILOSEC) 20 MG CAPSULE    Take 1 capsule by mouth once daily   OXYCODONE-ACETAMINOPHEN (PERCOCET/ROXICET) 5-325 MG TABLET    Take 1 tablet by mouth every 4 (four) hours as needed for severe pain.   ROSUVASTATIN (CRESTOR) 5 MG TABLET    Take 1 tablet (5 mg total) by mouth daily.   SILDENAFIL (VIAGRA) 100 MG TABLET     TAKE 1 TABLET BY MOUTH ONCE DAILY AS NEEDED   TIZANIDINE (ZANAFLEX) 2 MG TABLET    Take 1 tablet (2 mg total) by mouth every 6 (six) hours as needed.   TRAZODONE (DESYREL) 100 MG TABLET    TAKE 1 TABLET BY MOUTH AT  BEDTIME   TROLAMINE SALICYLATE (ASPERCREME) 10 % CREAM    Apply 1 application topically as needed (knee pain).   VITAMIN E 400 UNIT CAPSULE    Take 400 Units by mouth every evening.   Modified Medications   No medications on file  Discontinued Medications   No medications on file     Subjective: David Lindsey is in for his hospital follow-up visit.  He underwent left total knee arthroplasty in November 2019.  He did well until mid January of this year when he had sudden onset of swelling, pain, redness and warmth in his left knee.  He started having fever and sweats.  He was evaluated on 05/04/2019 and was treated with cephalexin for cellulitis.  He did not improve and underwent arthrocentesis on 05/08/2019.  Doxycycline was added to his therapy.  Synovial fluid analyses showed 82,250 white blood cells of which 86% were segmented neutrophils.  No organisms were seen on Gram stain but culture grew Serratia.  He was admitted to the hospital and underwent incision and drainage with polyethylene exchange.  He was discharged on oral levofloxacin and has now completed 6 weeks of total therapy.  He has not had any problems tolerating levofloxacin.  He is feeling much better.  He will occasionally take Tylenol at night for left knee pain.  He is able to do more walking now.  Review of Systems: Review of Systems  Constitutional: Negative for chills, diaphoresis and fever.  Gastrointestinal: Negative for abdominal pain, nausea and vomiting.  Musculoskeletal: Positive for joint pain.    Past Medical History:  Diagnosis Date  . ALCOHOLISM 12/22/2007  . ANEMIA, IRON DEFICIENCY 12/26/2007  . Arthritis    OA  . Blood transfusion without reported diagnosis    as a child  . DIABETES MELLITUS,  TYPE II 11/10/2006  . ESOPHAGITIS, REFLUX 12/22/2007  . GERD (gastroesophageal reflux disease)   . HYPERCHOLESTEROLEMIA 12/22/2007  . HYPERTENSION 11/10/2006  . HYPOGONADISM 12/22/2007   Prob. due to ETOH  . HYPOKALEMIA 06/06/2008  . Leukopenia   . Neuromuscular disorder ([place])    neuropathy in feet from Diabetes  . Neuropathy    Chronic Neuropathy BOTH FEET  . Pancytopenia 06/06/2008  . RENAL INSUFFICIENCY 11/10/2006   SEES [facility]  STAGE 2 CKD    Social History   Tobacco Use  . Smoking status: Never Smoker  . Smokeless tobacco: Never Used  Substance Use Topics  . Alcohol use: No    Alcohol/week: 0.0 standard drinks    Comment: LAST USE 10 YRS AGO  . Drug use: No    Family History  Problem Relation Age of Onset  . Cancer Mother        Colon Cancer  . Colon cancer Mother   . Colon polyps Brother   . Esophageal cancer Neg Hx   . Rectal cancer Neg Hx   . Stomach cancer Neg Hx     Allergies  Allergen Reactions  . Pioglitazone Swelling    Objective: Vitals:   06/27/19 1532  BP: (!) 148/81  Pulse: (!) 103  Temp: 98.3 F (36.8 C)  TempSrc: Oral  Weight: 288 lb (130.6 kg)   Body mass index is 40.17 kg/m.  Physical Exam Constitutional:      Comments: He is in good spirits.  Musculoskeletal:     Comments: His left knee incision has healed nicely.  He has mild diffuse swelling and warmth of his left knee.  He has good range of motion.  Skin:    Findings: No rash.  Psychiatric:        Mood and Affect: Mood normal.     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  I will change her levofloxacin to renally adjusted trimethoprim sulfamethoxazole which has a better side effect profile.  I will  check repeat blood work today.  He will follow-up in 8 weeks.      Relevant Medications   sulfamethoxazole-trimethoprim (BACTRIM) 400-80 MG tablet   Other Relevant Orders   CBC   Basic metabolic panel   C-reactive protein   Sedimentation rate       Michel Bickers, MD [facility] for Infectious [place] [phone] pager   [phone] cell 06/27/2019, 4:00 PM

## 2019-06-27 NOTE — Assessment & Plan Note (Signed)
He is improving on therapy for Serratia prosthetic knee infection.  I will change her levofloxacin to renally adjusted trimethoprim sulfamethoxazole which has a better side effect profile.  I will check repeat blood work today.  He will follow-up in 8 weeks.

## 2019-06-28 LAB — CBC
HCT: 37.1 % — ABNORMAL LOW (ref 38.5–50.0)
Hemoglobin: 12 g/dL — ABNORMAL LOW (ref 13.2–17.1)
MCH: 27.9 pg (ref 27.0–33.0)
MCHC: 32.3 g/dL (ref 32.0–36.0)
MCV: 86.3 fL (ref 80.0–100.0)
MPV: 9.2 fL (ref 7.5–12.5)
Platelets: 207 10*3/uL (ref 140–400)
RBC: 4.3 10*6/uL (ref 4.20–5.80)
RDW: 13 % (ref 11.0–15.0)
WBC: 4.5 10*3/uL (ref 3.8–10.8)

## 2019-06-28 LAB — SEDIMENTATION RATE: Sed Rate: 38 mm/h — ABNORMAL HIGH (ref 0–20)

## 2019-06-28 LAB — BASIC METABOLIC PANEL
BUN/Creatinine Ratio: 9 (calc) (ref 6–22)
BUN: 17 mg/dL (ref 7–25)
CO2: 30 mmol/L (ref 20–32)
Calcium: 9.6 mg/dL (ref 8.6–10.3)
Chloride: 99 mmol/L (ref 98–110)
Creat: 1.9 mg/dL — ABNORMAL HIGH (ref 0.70–1.25)
Glucose, Bld: 196 mg/dL — ABNORMAL HIGH (ref 65–99)
Potassium: 4.4 mmol/L (ref 3.5–5.3)
Sodium: 136 mmol/L (ref 135–146)

## 2019-06-28 LAB — C-REACTIVE PROTEIN: CRP: 7.3 mg/L (ref <8.0)

## 2019-07-17 ENCOUNTER — Other Ambulatory Visit: Payer: Self-pay

## 2019-07-18 ENCOUNTER — Other Ambulatory Visit: Payer: Self-pay | Admitting: Family

## 2019-07-18 DIAGNOSIS — E291 Testicular hypofunction: Secondary | ICD-10-CM

## 2019-07-19 ENCOUNTER — Ambulatory Visit (INDEPENDENT_AMBULATORY_CARE_PROVIDER_SITE_OTHER): Payer: Medicare Other | Admitting: Endocrinology

## 2019-07-19 ENCOUNTER — Other Ambulatory Visit: Payer: Self-pay

## 2019-07-19 ENCOUNTER — Encounter: Payer: Self-pay | Admitting: Endocrinology

## 2019-07-19 VITALS — BP 118/70 | HR 95 | Ht 71.0 in | Wt 288.0 lb

## 2019-07-19 DIAGNOSIS — N181 Chronic kidney disease, stage 1: Secondary | ICD-10-CM

## 2019-07-19 DIAGNOSIS — E1122 Type 2 diabetes mellitus with diabetic chronic kidney disease: Secondary | ICD-10-CM

## 2019-07-19 LAB — POCT GLYCOSYLATED HEMOGLOBIN (HGB A1C): Hemoglobin A1C: 7.6 % — AB (ref 4.0–5.6)

## 2019-07-19 NOTE — Progress Notes (Signed)
Subjective:    Patient ID: David Lindsey, male    DOB: 1955-07-25, 64 y.o.   MRN: [id number]  HPI Pt returns for f/u of diabetes mellitus: DM type: Insulin-requiring type 2.   Dx'ed: 0000000 Complications: renal insufficiency and PN Therapy: insulin since 2019, [place], and [place].   DKA: never.   Severe hypoglycemia: never.   Pancreatitis: never.   Other: he did not tolerate parlodel (tremor); he cannot take pioglitizone (edema); he works 3rd shift; he takes qd insulin, after poor results with multiple daily injections; he is retired.   Interval history: no cbg record, but states cbg's vary from 100-200.  It is in general higher as the day goes on.  pt states he feels well in general.  Past Medical History:  Diagnosis Date  . ALCOHOLISM 12/22/2007  . ANEMIA, IRON DEFICIENCY 12/26/2007  . Arthritis    OA  . Blood transfusion without reported diagnosis    as a child  . DIABETES MELLITUS, TYPE II 11/10/2006  . ESOPHAGITIS, REFLUX 12/22/2007  . GERD (gastroesophageal reflux disease)   . HYPERCHOLESTEROLEMIA 12/22/2007  . HYPERTENSION 11/10/2006  . HYPOGONADISM 12/22/2007   Prob. due to ETOH  . HYPOKALEMIA 06/06/2008  . Leukopenia   . Neuromuscular disorder ([place])    neuropathy in feet from Diabetes  . Neuropathy    Chronic Neuropathy BOTH FEET  . Pancytopenia 06/06/2008  . RENAL INSUFFICIENCY 11/10/2006   SEES [place] KIDNEY STAGE 2 CKD    Past Surgical History:  Procedure Laterality Date  . APPENDECTOMY  AGE 33 OR 10  . COLONOSCOPY  08-21-2004   [facility]- normal  . COLONSCOPY  2017  . ELECTROCARDIOGRAM  11/07/2006  . ESOPHAGOGASTRODUODENOSCOPY  08/20/2004  . EYE SURGERY Bilateral 2014   [facility] FOR CATARACTS  . I & D KNEE WITH POLY EXCHANGE Left 05/16/2019   Procedure: LEFT KNEE IRRIGATION AND DEBRIDEMENT WITH POLY EXCHANGE;  Surgeon: Frederik Pear, MD;  Location: [facility];  Service: Orthopedics;  Laterality: Left;  . REPLACEMENT TOTAL KNEE Right 2011  . TOTAL KNEE ARTHROPLASTY Left  02/20/2018   Procedure: LEFT TOTAL KNEE ARTHROPLASTY;  Surgeon: Frederik Pear, MD;  Location: [facility];  Service: Orthopedics;  Laterality: Left;  . UPPER GASTROINTESTINAL ENDOSCOPY      Social History   Socioeconomic History  . Marital status: Married    Spouse name: Not on file  . Number of children: Not on file  . Years of education: Not on file  . Highest education level: Not on file  Occupational History  . 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 Smoking status: Never Smoker  . Smokeless tobacco: Never Used  Substance and Sexual Activity  . Alcohol use: No    Alcohol/week: 0.0 standard drinks    Comment: LAST USE 10 YRS AGO  . Drug use: No  . Sexual activity: Not on file  Other Topics Concern  . Not on file  Social History Narrative  . Not on file   Social Determinants of Health   Financial Resource Strain:   . Difficulty of Paying Living Expenses:   Food Insecurity:   . Worried About Charity fundraiser in the Last Year:   . Arboriculturist in the Last Year:   Transportation Needs:   . Film/video editor (Medical):   Marland Kitchen Lack of Transportation (Non-Medical):   Physical Activity:   . Days of Exercise per Week:   . Minutes of Exercise per Session:  Stress:   . Feeling of Stress :   Social Connections:   . Frequency of Communication with Friends and Family:   . Frequency of Social Gatherings with Friends and Family:   . Attends Religious Services:   . Active Member of Clubs or Organizations:   . Attends Archivist Meetings:   Marland Kitchen Marital Status:   Intimate Partner Violence:   . Fear of Current or Ex-Partner:   . Emotionally Abused:   Marland Kitchen Physically Abused:   . Sexually Abused:     Current Outpatient Medications on File Prior to Visit  Medication Sig Dispense Refill  . amLODipine (NORVASC) 10 MG tablet TAKE 1 TABLET BY MOUTH EVERY DAY 90 tablet 1  . Apple Cider Vinegar 600 MG CAPS Take 600 mg by mouth daily.    .  Ascorbic Acid (VITAMIN C) 1000 MG tablet Take 1,000 mg by mouth every evening.    Marland Kitchen aspirin EC 81 MG tablet Take 1 tablet (81 mg total) by mouth 2 (two) times daily. 60 tablet 0  . Biotin w/ Vitamins C & E (HAIR SKIN & NAILS GUMMIES PO) Take 1 tablet by mouth every evening.    . Carboxymethylcellul-Glycerin (LUBRICATING EYE DROPS OP) Place 1 drop into both eyes daily as needed (dry eyes).    . cholecalciferol (VITAMIN D3) 25 MCG (1000 UNIT) tablet Take 1,000 Units by mouth daily.    . clotrimazole-betamethasone (LOTRISONE) cream APPLY  CREAM TOPICALLY TO AFFECTED AREA THREE TIMES DAILY AS NEEDED FOR  RASH (Patient taking differently: Apply 1 application topically 3 (three) times daily as needed (rash). ) 45 g 0  . Coenzyme Q10 (COQ-10) 200 MG CAPS Take 200 mg by mouth every evening.     . Cyanocobalamin (B-12) 5000 MCG CAPS Take 5,000 mcg by mouth every evening.     . dapagliflozin propanediol (FARXIGA) 10 MG TABS tablet Take 10 mg by mouth daily. 90 tablet 3  . docusate sodium (COLACE) 100 MG capsule Take 200 mg by mouth every evening.    . gabapentin (NEURONTIN) 300 MG capsule TAKE 1 CAPSULE BY MOUTH 3  TIMES DAILY 270 capsule 3  . glucose blood (ONE TOUCH ULTRA TEST) test strip Use as instructed check blood sugar two times daily     . Insulin Lispro Prot & Lispro (HUMALOG MIX 75/25 KWIKPEN) (75-25) 100 UNIT/ML Kwikpen Inject 28 Units into the skin daily with breakfast. 15 mL 11  . levofloxacin (LEVAQUIN) 750 MG tablet Take 1 tablet (750 mg total) by mouth every other day. 30 tablet 0  . liraglutide (VICTOZA) 18 MG/3ML SOPN Inject 0.3 mLs (1.8 mg total) into the skin every morning. 9 mL 3  . lisinopril (ZESTRIL) 10 MG tablet Take 10 mg by mouth daily.    . Menthol, Topical Analgesic, (STOPAIN ROLL-ON) 8 % LIQD Apply 1 application topically 3 (three) times daily as needed (for knee pain.).    Marland Kitchen Methylcellulose, Laxative, (HM FIBER) 500 MG TABS Take 500 mg by mouth daily. Fiberwell Gummies     .  metoprolol succinate (TOPROL-XL) 25 MG 24 hr tablet TAKE 1/2 (ONE-HALF) TABLET BY MOUTH AT BEDTIME 45 tablet 0  . MM PEN NEEDLES 31G X 8 MM MISC USE AS DIRECTED WITH  VICTOZA 100 each 0  . Multiple Vitamin (MULTIVITAMIN WITH MINERALS) TABS tablet Take 1 tablet by mouth every evening.     . Omega-3-6-9 CAPS Take 1 capsule by mouth every evening. 1600mg     . omeprazole (PRILOSEC) 20 MG capsule  Take 1 capsule by mouth once daily 90 capsule 0  . oxyCODONE-acetaminophen (PERCOCET/ROXICET) 5-325 MG tablet Take 1 tablet by mouth every 4 (four) hours as needed for severe pain. 30 tablet 0  . rosuvastatin (CRESTOR) 5 MG tablet Take 1 tablet (5 mg total) by mouth daily. (Patient taking differently: Take 5 mg by mouth at bedtime. ) 90 tablet 3  . sildenafil (VIAGRA) 100 MG tablet TAKE 1 TABLET BY MOUTH ONCE DAILY AS NEEDED 6 tablet 0  . sulfamethoxazole-trimethoprim (BACTRIM) 400-80 MG tablet Take 1 tablet by mouth 2 (two) times daily. 60 tablet 4  . tiZANidine (ZANAFLEX) 2 MG tablet Take 1 tablet (2 mg total) by mouth every 6 (six) hours as needed. 60 tablet 0  . traZODone (DESYREL) 100 MG tablet TAKE 1 TABLET BY MOUTH AT  BEDTIME (Patient taking differently: Take 100 mg by mouth at bedtime. ) 90 tablet 3  . trolamine salicylate (ASPERCREME) 10 % cream Apply 1 application topically as needed (knee pain).    . vitamin E 400 UNIT capsule Take 400 Units by mouth every evening.      No current facility-administered medications on file prior to visit.    Allergies  Allergen Reactions  . Pioglitazone Swelling    Family History  Problem Relation Age of Onset  . Cancer Mother        Colon Cancer  . Colon cancer Mother   . Colon polyps Brother   . Esophageal cancer Neg Hx   . Rectal cancer Neg Hx   . Stomach cancer Neg Hx     BP 118/70   Pulse 95   Ht 5\' 11"  (1.803 m)   Wt 288 lb (130.6 kg)   SpO2 99%   BMI 40.17 kg/m    Review of Systems He denies hypoglycemia.     Objective:   Physical  Exam VITAL SIGNS:  See vs page GENERAL: no distress Pulses: dorsalis pedis intact bilat.   MSK: no deformity of the feet CV: 2+ left, and trace right leg edema Skin:  no ulcer on the feet.  normal color and temp on the feet. Neuro: sensation is intact to touch on the feet Ext: there is bilateral onychomycosis of the toenails.    Lab Results  Component Value Date   HGBA1C 7.6 (A) 07/19/2019       Assessment & Plan:  Insulin-requiring type 2 DM, with PN: this is the best control this pt should aim for, given this regimen, which does match insulin to his changing needs throughout the day Edema: This limits rx options. CRI: This also limits rx options  Patient Instructions  Please continue the same diabetes medications.   check your blood sugar twice a day.  vary the time of day when you check, between before the 3 meals, and at bedtime.  also check if you have symptoms of your blood sugar being too high or too low.  please keep a record of the readings and bring it to your next appointment here (or you can bring the meter itself).  You can write it on any piece of paper.  please call us sooner if your blood sugar goes below 70, or if you have a lot of readings over 200. Please come back for a follow-up appointment in 3 months.

## 2019-07-19 NOTE — Patient Instructions (Addendum)
Please continue the same diabetes medications.   check your blood sugar twice a day.  vary the time of day when you check, between before the 3 meals, and at bedtime.  also check if you have symptoms of your blood sugar being too high or too low.  please keep a record of the readings and bring it to your next appointment here (or you can bring the meter itself).  You can write it on any piece of paper.  please call us sooner if your blood sugar goes below 70, or if you have a lot of readings over 200. Please come back for a follow-up appointment in 3 months.   

## 2019-07-24 DIAGNOSIS — N183 Chronic kidney disease, stage 3 unspecified: Secondary | ICD-10-CM | POA: Diagnosis not present

## 2019-07-24 DIAGNOSIS — N189 Chronic kidney disease, unspecified: Secondary | ICD-10-CM | POA: Diagnosis not present

## 2019-08-01 DIAGNOSIS — I129 Hypertensive chronic kidney disease with stage 1 through stage 4 chronic kidney disease, or unspecified chronic kidney disease: Secondary | ICD-10-CM | POA: Diagnosis not present

## 2019-08-01 DIAGNOSIS — N1832 Chronic kidney disease, stage 3b: Secondary | ICD-10-CM | POA: Diagnosis not present

## 2019-08-01 DIAGNOSIS — E871 Hypo-osmolality and hyponatremia: Secondary | ICD-10-CM | POA: Diagnosis not present

## 2019-08-01 DIAGNOSIS — D631 Anemia in chronic kidney disease: Secondary | ICD-10-CM | POA: Diagnosis not present

## 2019-08-01 DIAGNOSIS — E872 Acidosis: Secondary | ICD-10-CM | POA: Diagnosis not present

## 2019-08-21 ENCOUNTER — Other Ambulatory Visit: Payer: Self-pay | Admitting: Family

## 2019-08-21 DIAGNOSIS — E291 Testicular hypofunction: Secondary | ICD-10-CM

## 2019-08-27 ENCOUNTER — Other Ambulatory Visit: Payer: Self-pay

## 2019-08-27 ENCOUNTER — Ambulatory Visit (INDEPENDENT_AMBULATORY_CARE_PROVIDER_SITE_OTHER): Payer: Medicare Other | Admitting: Family

## 2019-08-27 ENCOUNTER — Encounter: Payer: Self-pay | Admitting: Family

## 2019-08-27 VITALS — BP 126/72 | HR 68 | Temp 98.3°F | Ht 71.0 in | Wt 296.8 lb

## 2019-08-27 DIAGNOSIS — E1122 Type 2 diabetes mellitus with diabetic chronic kidney disease: Secondary | ICD-10-CM | POA: Diagnosis not present

## 2019-08-27 DIAGNOSIS — I10 Essential (primary) hypertension: Secondary | ICD-10-CM | POA: Diagnosis not present

## 2019-08-27 DIAGNOSIS — N181 Chronic kidney disease, stage 1: Secondary | ICD-10-CM

## 2019-08-27 DIAGNOSIS — R2242 Localized swelling, mass and lump, left lower limb: Secondary | ICD-10-CM | POA: Diagnosis not present

## 2019-08-27 NOTE — Progress Notes (Signed)
David Lindsey is a 64 y.o. male with the following history as recorded in Epic[facility]:  Patient Active Problem List   Diagnosis Date Noted  . CKD (chronic kidney disease) stage 3, GFR 30-59 ml/min 05/16/2019  . 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 Degenerative arthritis of left knee 11/18/2017  . Left shoulder pain 08/29/2017  . History of total right knee replacement (TKR) 03/25/2017  . Arthralgia 02/24/2017  . Edema 01/13/2017  . Wellness examination 01/30/2015  . Myalgia and myositis 01/28/2014  . Unspecified constipation 03/13/2013  . Screening for prostate cancer 01/18/2013  . Diabetes mellitus with renal manifestation ([place]) 01/18/2013  . Encounter for long-term (current) use of other medications 03/24/2012  . HYPOKALEMIA 06/06/2008  . Pancytopenia ([place]) 06/06/2008  . Hypogonadism, male 12/22/2007  . HYPERCHOLESTEROLEMIA 12/22/2007  . ALCOHOLISM 12/22/2007  . ESOPHAGITIS, REFLUX 12/22/2007  . Leg pain 12/22/2007  . Essential hypertension 11/10/2006  . Disorder resulting from impaired renal function 11/10/2006    Current Outpatient Medications  Medication Sig Dispense Refill  . amLODipine (NORVASC) 10 MG tablet TAKE 1 TABLET BY MOUTH EVERY DAY 90 tablet 1  . Apple Cider Vinegar 600 MG CAPS Take 600 mg by mouth daily.    . Ascorbic Acid (VITAMIN C) 1000 MG tablet Take 1,000 mg by mouth every evening.    Marland Kitchen aspirin EC 81 MG tablet Take 1 tablet (81 mg total) by mouth 2 (two) times daily. 60 tablet 0  . Biotin w/ Vitamins C & E (HAIR SKIN & NAILS GUMMIES PO) Take 1 tablet by mouth every evening.    . Carboxymethylcellul-Glycerin (LUBRICATING EYE DROPS OP) Place 1 drop into both eyes daily as needed (dry eyes).    . cholecalciferol (VITAMIN D3) 25 MCG (1000 UNIT) tablet Take 1,000 Units by mouth daily.    . clotrimazole-betamethasone (LOTRISONE) cream APPLY  CREAM TOPICALLY TO AFFECTED AREA THREE TIMES DAILY AS  NEEDED FOR  RASH (Patient taking differently: Apply 1 application topically 3 (three) times daily as needed (rash). ) 45 g 0  . Coenzyme Q10 (COQ-10) 200 MG CAPS Take 200 mg by mouth every evening.     . Cyanocobalamin (B-12) 5000 MCG CAPS Take 5,000 mcg by mouth every evening.     . dapagliflozin propanediol (FARXIGA) 10 MG TABS tablet Take 10 mg by mouth daily. 90 tablet 3  . docusate sodium (COLACE) 100 MG capsule Take 200 mg by mouth every evening.    . furosemide (LASIX) 20 MG tablet Take 20 mg by mouth.    . gabapentin (NEURONTIN) 300 MG capsule TAKE 1 CAPSULE BY MOUTH 3  TIMES DAILY 270 capsule 3  . glucose blood (ONE TOUCH ULTRA TEST) test strip Use as instructed check blood sugar two times daily     . Insulin Lispro Prot & Lispro (HUMALOG MIX 75/25 KWIKPEN) (75-25) 100 UNIT/ML Kwikpen Inject 28 Units into the skin daily with breakfast. 15 mL 11  . liraglutide (VICTOZA) 18 MG/3ML SOPN Inject 0.3 mLs (1.8 mg total) into the skin every morning. 9 mL 3  . lisinopril (ZESTRIL) 10 MG tablet Take 10 mg by mouth daily.    . Menthol, Topical Analgesic, (STOPAIN ROLL-ON) 8 % LIQD Apply 1 application topically 3 (three) times daily as needed (for knee pain.).    Marland Kitchen Methylcellulose, Laxative, (HM FIBER) 500 MG TABS Take 500 mg by mouth daily. Fiberwell Gummies     . metoprolol succinate (TOPROL-XL) 25 MG 24  hr tablet TAKE 1/2 (ONE-HALF) TABLET BY MOUTH AT BEDTIME 45 tablet 0  . MM PEN NEEDLES 31G X 8 MM MISC USE AS DIRECTED WITH  VICTOZA 100 each 0  . Multiple Vitamin (MULTIVITAMIN WITH MINERALS) TABS tablet Take 1 tablet by mouth every evening.     . Omega-3-6-9 CAPS Take 1 capsule by mouth every evening. 1600mg     . omeprazole (PRILOSEC) 20 MG capsule Take 1 capsule by mouth once daily 90 capsule 0  . oxyCODONE-acetaminophen (PERCOCET/ROXICET) 5-325 MG tablet Take 1 tablet by mouth every 4 (four) hours as needed for severe pain. 30 tablet 0  . rosuvastatin (CRESTOR) 5 MG tablet Take 1 tablet (5 mg  total) by mouth daily. (Patient taking differently: Take 5 mg by mouth at bedtime. ) 90 tablet 3  . sildenafil (VIAGRA) 100 MG tablet TAKE 1 TABLET BY MOUTH ONCE DAILY AS NEEDED 6 tablet 2  . SODIUM BICARBONATE PO Take 1 tablet by mouth 2 (two) times daily.    Marland Kitchen sulfamethoxazole-trimethoprim (BACTRIM) 400-80 MG tablet Take 1 tablet by mouth 2 (two) times daily. 60 tablet 4  . tiZANidine (ZANAFLEX) 2 MG tablet Take 1 tablet (2 mg total) by mouth every 6 (six) hours as needed. 60 tablet 0  . traZODone (DESYREL) 100 MG tablet TAKE 1 TABLET BY MOUTH AT  BEDTIME (Patient taking differently: Take 100 mg by mouth at bedtime. ) 90 tablet 3  . trolamine salicylate (ASPERCREME) 10 % cream Apply 1 application topically as needed (knee pain).    . vitamin E 400 UNIT capsule Take 400 Units by mouth every evening.      No current facility-administered medications for this visit.    Allergies: Pioglitazone  Past Medical History:  Diagnosis Date  . ALCOHOLISM 12/22/2007  . ANEMIA, IRON DEFICIENCY 12/26/2007  . Arthritis    OA  . Blood transfusion without reported diagnosis    as a child  . DIABETES MELLITUS, TYPE II 11/10/2006  . ESOPHAGITIS, REFLUX 12/22/2007  . GERD (gastroesophageal reflux disease)   . HYPERCHOLESTEROLEMIA 12/22/2007  . HYPERTENSION 11/10/2006  . HYPOGONADISM 12/22/2007   Prob. due to ETOH  . HYPOKALEMIA 06/06/2008  . Leukopenia   . Neuromuscular disorder ([place])    neuropathy in feet from Diabetes  . Neuropathy    Chronic Neuropathy BOTH FEET  . Pancytopenia 06/06/2008  . RENAL INSUFFICIENCY 11/10/2006   SEES [place] KIDNEY STAGE 2 CKD    Past Surgical History:  Procedure Laterality Date  . APPENDECTOMY  AGE 64 OR 10  . COLONOSCOPY  08-21-2004   [facility]- normal  . COLONSCOPY  2017  . ELECTROCARDIOGRAM  11/07/2006  . ESOPHAGOGASTRODUODENOSCOPY  08/20/2004  . EYE SURGERY Bilateral 2014   [facility] FOR CATARACTS  . I & D KNEE WITH POLY EXCHANGE Left 05/16/2019   Procedure: LEFT KNEE  IRRIGATION AND DEBRIDEMENT WITH POLY EXCHANGE;  Surgeon: Frederik Pear, MD;  Location: [facility];  Service: Orthopedics;  Laterality: Left;  . REPLACEMENT TOTAL KNEE Right 2011  . TOTAL KNEE ARTHROPLASTY Left 02/20/2018   Procedure: LEFT TOTAL KNEE ARTHROPLASTY;  Surgeon: Frederik Pear, MD;  Location: [facility];  Service: Orthopedics;  Laterality: Left;  . UPPER GASTROINTESTINAL ENDOSCOPY      Family History  Problem Relation Age of Onset  . Cancer Mother        Colon Cancer  . Colon cancer Mother   . Colon polyps Brother   . Esophageal cancer Neg Hx   . Rectal cancer Neg Hx   .  Stomach cancer Neg Hx     Social History   Tobacco Use  . Smoking status: Never Smoker  . Smokeless tobacco: Never Used  Substance Use Topics  . Alcohol use: No    Alcohol/week: 0.0 standard drinks    Comment: LAST USE 10 YRS AGO    Subjective:  6 month follow up; since last OV here, he has had to have a revision of his left knee replacement due to infection; under care of ID and doing much better; Working with endocrine for management of his diabetes- last Hgba1c at 7.6; Has seen his kidney doctor recently who made some changes to his regimen as well; Denies any chest pain, shortness of breath, blurred vision or headache   Objective:  Vitals:   08/27/19 0912  BP: 126/72  Pulse: 68  Temp: 98.3 F (36.8 C)  TempSrc: Oral  SpO2: 99%  Weight: 296 lb 12.8 oz (134.6 kg)  Height: 5\' 11"  (1.803 m)    General: Well developed, well nourished, in no acute distress  Skin : Warm and dry.  Head: Normocephalic and atraumatic  Eyes: Sclera and conjunctiva clear; pupils round and reactive to light; extraocular movements intact  Ears: External normal; canals clear; tympanic membranes normal  Oropharynx: Pink, supple. No suspicious lesions  Neck: Supple without thyromegaly, adenopathy  Lungs: Respirations unlabored; clear to auscultation bilaterally without wheeze, rales, rhonchi  Musculoskeletal: No deformities; no  active joint inflammation  Extremities: mild edema noted in left leg, no cyanosis, no clubbing  Vessels: Symmetric bilaterally  Neurologic: Alert and oriented; speech intact; face symmetrical; moves all extremities well; CNII-XII intact without focal deficit   Assessment:  1. Essential hypertension   2. Type 2 diabetes mellitus with stage 1 chronic kidney disease, without long-term current use of insulin ([place])   3. Localized swelling of left lower extremity     Plan:  1. Stable; continue with nephrologist as scheduled; they are now managing his blood pressure; 2. Stable; continue with endocrine as scheduled; needs to get his yearly eye exam updated; 3. Improving as infection in left knee is resolving;  This visit occurred during the SARS-CoV-2 public health emergency.  Safety protocols were in place, including screening questions prior to the visit, additional usage of staff PPE, and extensive cleaning of exam room while observing appropriate contact time as indicated for disinfecting solutions.    Return in about 7 months (around 03/28/2020) for CPE.  No orders of the defined types were placed in this encounter.   Requested Prescriptions    No prescriptions requested or ordered in this encounter

## 2019-08-28 DIAGNOSIS — M25562 Pain in left knee: Secondary | ICD-10-CM | POA: Diagnosis not present

## 2019-08-30 ENCOUNTER — Other Ambulatory Visit: Payer: Self-pay | Admitting: Family

## 2019-09-04 ENCOUNTER — Encounter: Payer: Self-pay | Admitting: Internal Medicine

## 2019-09-04 ENCOUNTER — Ambulatory Visit (INDEPENDENT_AMBULATORY_CARE_PROVIDER_SITE_OTHER): Payer: Medicare Other | Admitting: Internal Medicine

## 2019-09-04 ENCOUNTER — Other Ambulatory Visit: Payer: Self-pay

## 2019-09-04 DIAGNOSIS — T8459XD Infection and inflammatory reaction due to other internal joint prosthesis, subsequent encounter: Secondary | ICD-10-CM | POA: Diagnosis not present

## 2019-09-04 DIAGNOSIS — Z96652 Presence of left artificial knee joint: Secondary | ICD-10-CM | POA: Diagnosis not present

## 2019-09-04 DIAGNOSIS — Z96659 Presence of unspecified artificial knee joint: Secondary | ICD-10-CM | POA: Diagnosis not present

## 2019-09-04 DIAGNOSIS — Y838 Other surgical procedures as the cause of abnormal reaction of the patient, or of later complication, without mention of misadventure at the time of the procedure: Secondary | ICD-10-CM

## 2019-09-04 MED ORDER — SULFAMETHOXAZOLE-TRIMETHOPRIM 400-80 MG PO TABS: 1.0000 | ORAL_TABLET | Freq: Two times a day (BID) | ORAL | 4 refills | Status: DC

## 2019-09-04 NOTE — Progress Notes (Signed)
[facility]  Patient Active Problem List   Diagnosis Date Noted  . 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 CKD (chronic kidney disease) stage 3, GFR 30-59 ml/min 05/16/2019  . 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 Degenerative arthritis of left knee 11/18/2017  . Left shoulder pain 08/29/2017  . History of total right knee replacement (TKR) 03/25/2017  . Arthralgia 02/24/2017  . Edema 01/13/2017  . Wellness examination 01/30/2015  . Myalgia and myositis 01/28/2014  . Unspecified constipation 03/13/2013  . Screening for prostate cancer 01/18/2013  . Diabetes mellitus with renal manifestation ([place]) 01/18/2013  . Encounter for long-term (current) use of other medications 03/24/2012  . HYPOKALEMIA 06/06/2008  . Pancytopenia ([place]) 06/06/2008  . Hypogonadism, male 12/22/2007  . HYPERCHOLESTEROLEMIA 12/22/2007  . ALCOHOLISM 12/22/2007  . ESOPHAGITIS, REFLUX 12/22/2007  . Leg pain 12/22/2007  . Essential hypertension 11/10/2006  . Disorder resulting from impaired renal function 11/10/2006    Patient's Medications  New Prescriptions   No medications on file  Previous Medications   AMLODIPINE (NORVASC) 10 MG TABLET    TAKE 1 TABLET BY MOUTH EVERY DAY   APPLE CIDER VINEGAR 600 MG CAPS    Take 600 mg by mouth daily.   ASCORBIC ACID (VITAMIN C) 1000 MG TABLET    Take 1,000 mg by mouth every evening.   ASPIRIN EC 81 MG TABLET    Take 1 tablet (81 mg total) by mouth 2 (two) times daily.   BIOTIN W/ VITAMINS C & E (HAIR SKIN & NAILS GUMMIES PO)    Take 1 tablet by mouth every evening.   CARBOXYMETHYLCELLUL-GLYCERIN (LUBRICATING EYE DROPS OP)    Place 1 drop into both eyes daily as needed (dry eyes).   CHOLECALCIFEROL (VITAMIN D3) 25 MCG (1000 UNIT) TABLET    Take 1,000 Units by mouth daily.   CLOTRIMAZOLE-BETAMETHASONE (LOTRISONE) CREAM    APPLY  CREAM TOPICALLY TO AFFECTED AREA THREE TIMES DAILY AS NEEDED  FOR  RASH   COENZYME Q10 (COQ-10) 200 MG CAPS    Take 200 mg by mouth every evening.    CYANOCOBALAMIN (B-12) 5000 MCG CAPS    Take 5,000 mcg by mouth every evening.    DAPAGLIFLOZIN PROPANEDIOL (FARXIGA) 10 MG TABS TABLET    Take 10 mg by mouth daily.   DOCUSATE SODIUM (COLACE) 100 MG CAPSULE    Take 200 mg by mouth every evening.   FUROSEMIDE (LASIX) 20 MG TABLET    Take 20 mg by mouth.   GABAPENTIN (NEURONTIN) 300 MG CAPSULE    TAKE 1 CAPSULE BY MOUTH 3  TIMES DAILY   GLUCOSE BLOOD (ONE TOUCH ULTRA TEST) TEST STRIP    Use as instructed check blood sugar two times daily    INSULIN LISPRO PROT & LISPRO (HUMALOG MIX 75/25 KWIKPEN) (75-25) 100 UNIT/ML KWIKPEN    Inject 28 Units into the skin daily with breakfast.   LIRAGLUTIDE (VICTOZA) 18 MG/3ML SOPN    Inject 0.3 mLs (1.8 mg total) into the skin every morning.   LISINOPRIL (ZESTRIL) 10 MG TABLET    Take 10 mg by mouth daily.   MENTHOL, TOPICAL ANALGESIC, (STOPAIN ROLL-ON) 8 % LIQD    Apply 1 application topically 3 (three) times daily as needed (for knee pain.).   METHYLCELLULOSE, LAXATIVE, (HM FIBER) 500 MG TABS    Take 500 mg by mouth daily. Fiberwell Gummies    METOPROLOL  SUCCINATE (TOPROL-XL) 25 MG 24 HR TABLET    TAKE 1/2 (ONE-HALF) TABLET BY MOUTH AT BEDTIME   MM PEN NEEDLES 31G X 8 MM MISC    USE AS DIRECTED WITH  VICTOZA   MULTIPLE VITAMIN (MULTIVITAMIN WITH MINERALS) TABS TABLET    Take 1 tablet by mouth every evening.    OMEGA-3-6-9 CAPS    Take 1 capsule by mouth every evening. 1600mg    OMEPRAZOLE (PRILOSEC) 20 MG CAPSULE    Take 1 capsule by mouth once daily   OXYCODONE-ACETAMINOPHEN (PERCOCET/ROXICET) 5-325 MG TABLET    Take 1 tablet by mouth every 4 (four) hours as needed for severe pain.   ROSUVASTATIN (CRESTOR) 5 MG TABLET    Take 1 tablet (5 mg total) by mouth daily.   SILDENAFIL (VIAGRA) 100 MG TABLET    TAKE 1 TABLET BY MOUTH ONCE DAILY AS NEEDED   SODIUM BICARBONATE PO    Take 1 tablet by mouth 2 (two) times daily.    TIZANIDINE (ZANAFLEX) 2 MG TABLET    Take 1 tablet (2 mg total) by mouth every 6 (six) hours as needed.   TRAZODONE (DESYREL) 100 MG TABLET    TAKE 1 TABLET BY MOUTH AT  BEDTIME   TROLAMINE SALICYLATE (ASPERCREME) 10 % CREAM    Apply 1 application topically as needed (knee pain).   VITAMIN E 400 UNIT CAPSULE    Take 400 Units by mouth every evening.   Modified Medications   Modified Medication Previous Medication   SULFAMETHOXAZOLE-TRIMETHOPRIM (BACTRIM) 400-80 MG TABLET sulfamethoxazole-trimethoprim (BACTRIM) 400-80 MG tablet      Take 1 tablet by mouth 2 (two) times daily.    Take 1 tablet by mouth 2 (two) times daily.  Discontinued Medications   No medications on file     Subjective: Guerdon is in for his routine follow-up visit.  He underwent left total knee arthroplasty in November 2019.  He did well until mid January of this year when he had sudden onset of swelling, pain, redness and warmth in his left knee.  He started having fever and sweats.  He was evaluated on 05/04/2019 and was treated with cephalexin for cellulitis.  He did not improve and underwent arthrocentesis on 05/08/2019.  Doxycycline was added to his therapy.  Synovial fluid analyses showed 82,250 white blood cells of which 86% were segmented neutrophils.  No organisms were seen on Gram stain but culture grew Serratia.  He was admitted to the hospital and underwent incision and drainage with polyethylene exchange.  He was discharged on oral levofloxacin and completed 6 weeks of total therapy on 06/27/2019.  I switched him to oral trimethoprim sulfamethoxazole at that time.  He has had no trouble tolerating his antibiotics he is feeling much better.  He will occasionally take Tylenol at night for left knee pain.  He is able to do more walking and yard work.  Review of Systems: Review of Systems  Constitutional: Negative for chills, diaphoresis and fever.  Gastrointestinal: Negative for abdominal pain, nausea and vomiting.    Musculoskeletal: Positive for joint pain.    Past Medical History:  Diagnosis Date  . ALCOHOLISM 12/22/2007  . ANEMIA, IRON DEFICIENCY 12/26/2007  . Arthritis    OA  . Blood transfusion without reported diagnosis    as a child  . DIABETES MELLITUS, TYPE II 11/10/2006  . ESOPHAGITIS, REFLUX 12/22/2007  . GERD (gastroesophageal reflux disease)   . HYPERCHOLESTEROLEMIA 12/22/2007  . HYPERTENSION 11/10/2006  . HYPOGONADISM 12/22/2007   Prob.  due to ETOH  . HYPOKALEMIA 06/06/2008  . Leukopenia   . Neuromuscular disorder ([place])    neuropathy in feet from Diabetes  . Neuropathy    Chronic Neuropathy BOTH FEET  . Pancytopenia 06/06/2008  . RENAL INSUFFICIENCY 11/10/2006   SEES  KIDNEY STAGE 2 CKD    Social History   Tobacco Use  . Smoking status: Never Smoker  . Smokeless tobacco: Never Used  Substance Use Topics  . Alcohol use: No    Alcohol/week: 0.0 standard drinks    Comment: LAST USE 10 YRS AGO  . Drug use: No    Family History  Problem Relation Age of Onset  . Cancer Mother        Colon Cancer  . Colon cancer Mother   . Colon polyps Brother   . Esophageal cancer Neg Hx   . Rectal cancer Neg Hx   . Stomach cancer Neg Hx     Allergies  Allergen Reactions  . Pioglitazone Swelling    Objective: Vitals:   09/04/19 1547  BP: (!) 153/84  Pulse: 95  Temp: 98.3 F (36.8 C)  SpO2: 99%  Weight: 295 lb (133.8 kg)  Height: 5\' 11"  (1.803 m)   Body mass index is 41.14 kg/m.  Physical Exam Constitutional:      Comments: His spirits are good.  Musculoskeletal:     Comments: His left knee incision has healed nicely. He has good range of motion.  Skin:    Findings: No rash.  Psychiatric:        Mood and Affect: Mood normal.     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  Last visit in March his C-reactive protein has normalized and his sed rate was improving.  We will repeat blood work today and see him back in 4 to 6 weeks      Relevant Medications   sulfamethoxazole-trimethoprim (BACTRIM) 400-80 MG tablet   Other Relevant Orders   C-reactive protein   Sedimentation rate   CBC   Basic metabolic panel       Michel Bickers, MD [facility] [place] [facility] [phone] pager   [phone] cell 09/04/2019, 4:58 PM

## 2019-09-04 NOTE — Assessment & Plan Note (Signed)
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  Last visit in March his C-reactive protein has normalized and his sed rate was improving.  We will repeat blood work today and see him back in 4 to 6 weeks

## 2019-09-05 LAB — BASIC METABOLIC PANEL
BUN/Creatinine Ratio: 9 (calc) (ref 6–22)
BUN: 19 mg/dL (ref 7–25)
CO2: 24 mmol/L (ref 20–32)
Calcium: 9.3 mg/dL (ref 8.6–10.3)
Chloride: 100 mmol/L (ref 98–110)
Creat: 2.15 mg/dL — ABNORMAL HIGH (ref 0.70–1.25)
Glucose, Bld: 225 mg/dL — ABNORMAL HIGH (ref 65–99)
Potassium: 4.4 mmol/L (ref 3.5–5.3)
Sodium: 134 mmol/L — ABNORMAL LOW (ref 135–146)

## 2019-09-05 LAB — CBC
HCT: 40.3 % (ref 38.5–50.0)
Hemoglobin: 13 g/dL — ABNORMAL LOW (ref 13.2–17.1)
MCH: 27.9 pg (ref 27.0–33.0)
MCHC: 32.3 g/dL (ref 32.0–36.0)
MCV: 86.5 fL (ref 80.0–100.0)
MPV: 9.3 fL (ref 7.5–12.5)
Platelets: 173 10*3/uL (ref 140–400)
RBC: 4.66 10*6/uL (ref 4.20–5.80)
RDW: 12.9 % (ref 11.0–15.0)
WBC: 4.3 10*3/uL (ref 3.8–10.8)

## 2019-09-05 LAB — SEDIMENTATION RATE: Sed Rate: 6 mm/h (ref 0–20)

## 2019-09-05 LAB — C-REACTIVE PROTEIN: CRP: 0.9 mg/L (ref <8.0)

## 2019-09-15 ENCOUNTER — Other Ambulatory Visit: Payer: Self-pay | Admitting: Endocrinology

## 2019-09-17 ENCOUNTER — Encounter: Payer: Self-pay | Admitting: Endocrinology

## 2019-09-24 ENCOUNTER — Other Ambulatory Visit: Payer: Self-pay | Admitting: Endocrinology

## 2019-10-09 ENCOUNTER — Other Ambulatory Visit: Payer: Self-pay | Admitting: Endocrinology

## 2019-10-10 ENCOUNTER — Telehealth: Payer: Self-pay

## 2019-10-10 NOTE — Telephone Encounter (Signed)
PATIENT ASSISTANCE PROGRAM  PROVIDER SECTION OF APPLICATION Company: [employer]  Medication(s) ordered: Novolog 70/30 Document: Rx  All above requested information has been faxed successfully to [employer] listed above. Documents and fax confirmation have been placed in the faxed file for future reference.  PATIENT SECTION OF APPLICATION PATIENT PORTION of the application faxed? Yes Completed in its ENTIRETY INCLUDING financials? Yes Missing or incomplete information will be the responsibility of the patient to provide to the above mentioned company? Yes

## 2019-10-16 ENCOUNTER — Other Ambulatory Visit: Payer: Self-pay

## 2019-10-16 ENCOUNTER — Encounter: Payer: Self-pay | Admitting: Endocrinology

## 2019-10-16 DIAGNOSIS — N181 Chronic kidney disease, stage 1: Secondary | ICD-10-CM

## 2019-10-16 DIAGNOSIS — E1122 Type 2 diabetes mellitus with diabetic chronic kidney disease: Secondary | ICD-10-CM

## 2019-10-16 MED ORDER — DAPAGLIFLOZIN PROPANEDIOL 10 MG PO TABS: 10.0000 mg | ORAL_TABLET | Freq: Every day | ORAL | 3 refills | Status: DC

## 2019-10-16 NOTE — Telephone Encounter (Signed)
Letter has been printed and has been placed on Dr. Cordelia Pen desk for him to review and to approve of sending Rx as requested. Will await his response

## 2019-10-16 NOTE — Telephone Encounter (Signed)
Please advise. We sent a patient assistance form to [employer] for the medication listed below which has been approved  [place] - APPROVAL/DENIAL   Received notification from [employer] indicating application for Novolog 70/30 has been approved. Document has been labeled and placed in scans file for HIM scanning process and for our future reference.  Are we submitting for Humalog 75/25 as well?

## 2019-10-16 NOTE — Telephone Encounter (Signed)
PATIENT ASSISTANCE PROGRAM - APPROVAL/DENIAL  Received notification from [employer] indicating application for Novolog 70/30 has been approved. Document has been labeled and placed in scans file for HIM scanning process and for our future reference.

## 2019-10-16 NOTE — Telephone Encounter (Signed)
FAXED [place]: [facility] Document: Rx request Other records requested: Rx for Wilder Glade  All above requested information has been faxed successfully to [employer] listed above. Documents and fax confirmation have been placed in the faxed file for future reference.

## 2019-10-18 ENCOUNTER — Other Ambulatory Visit: Payer: Self-pay

## 2019-10-18 ENCOUNTER — Encounter: Payer: Self-pay | Admitting: Endocrinology

## 2019-10-18 ENCOUNTER — Encounter: Payer: Self-pay | Admitting: Internal Medicine

## 2019-10-18 ENCOUNTER — Ambulatory Visit (INDEPENDENT_AMBULATORY_CARE_PROVIDER_SITE_OTHER): Payer: Medicare Other | Admitting: Internal Medicine

## 2019-10-18 DIAGNOSIS — T8459XD Infection and inflammatory reaction due to other internal joint prosthesis, subsequent encounter: Secondary | ICD-10-CM

## 2019-10-18 DIAGNOSIS — Z96659 Presence of unspecified artificial knee joint: Secondary | ICD-10-CM | POA: Diagnosis not present

## 2019-10-18 NOTE — Assessment & Plan Note (Signed)
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  Based on current guidelines we like to go with at least 6 months antibiotic treatment.  Plan at length today about the relative risks and benefits of continuing antibiotics longer.  Need to try our best to balance the benefit of continuing trimethoprim sulfamethoxazole against the risks (C. difficile colitis, worsening nephrotoxicity hyperkalemia) of continuing it.  He will meet with his orthopedic surgeon and nephrologist soon.  I will see him back following those appointments to help him decide if he is ready to stop antibiotic therapy.

## 2019-10-18 NOTE — Progress Notes (Signed)
[facility] for Infectious Disease  Patient Active Problem List   Diagnosis Date Noted  . 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 CKD (chronic kidney disease) stage 3, GFR 30-59 ml/min 05/16/2019  . 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 Degenerative arthritis of left knee 11/18/2017  . Left shoulder pain 08/29/2017  . History of total right knee replacement (TKR) 03/25/2017  . Arthralgia 02/24/2017  . Edema 01/13/2017  . Wellness examination 01/30/2015  . Myalgia and myositis 01/28/2014  . Unspecified constipation 03/13/2013  . Screening for prostate cancer 01/18/2013  . Diabetes mellitus with renal manifestation ([place]) 01/18/2013  . Encounter for long-term (current) use of other medications 03/24/2012  . HYPOKALEMIA 06/06/2008  . Pancytopenia ([place]) 06/06/2008  . Hypogonadism, male 12/22/2007  . HYPERCHOLESTEROLEMIA 12/22/2007  . ALCOHOLISM 12/22/2007  . ESOPHAGITIS, REFLUX 12/22/2007  . Leg pain 12/22/2007  . Essential hypertension 11/10/2006  . Disorder resulting from impaired renal function 11/10/2006    Patient's Medications  New Prescriptions   No medications on file  Previous Medications   AMLODIPINE (NORVASC) 10 MG TABLET    TAKE 1 TABLET BY MOUTH EVERY DAY   APPLE CIDER VINEGAR 600 MG CAPS    Take 600 mg by mouth daily.   ASCORBIC ACID (VITAMIN C) 1000 MG TABLET    Take 1,000 mg by mouth every evening.   ASPIRIN EC 81 MG TABLET    Take 1 tablet (81 mg total) by mouth 2 (two) times daily.   BIOTIN W/ VITAMINS C & E (HAIR SKIN & NAILS GUMMIES PO)    Take 1 tablet by mouth every evening.   CARBOXYMETHYLCELLUL-GLYCERIN (LUBRICATING EYE DROPS OP)    Place 1 drop into both eyes daily as needed (dry eyes).   CHOLECALCIFEROL (VITAMIN D3) 25 MCG (1000 UNIT) TABLET    Take 1,000 Units by mouth daily.   CLOTRIMAZOLE-BETAMETHASONE (LOTRISONE) CREAM    APPLY  CREAM TOPICALLY TO AFFECTED AREA THREE TIMES DAILY AS NEEDED  FOR  RASH   COENZYME Q10 (COQ-10) 200 MG CAPS    Take 200 mg by mouth every evening.    CYANOCOBALAMIN (B-12) 5000 MCG CAPS    Take 5,000 mcg by mouth every evening.    DAPAGLIFLOZIN PROPANEDIOL (FARXIGA) 10 MG TABS TABLET    Take 1 tablet (10 mg total) by mouth daily.   DOCUSATE SODIUM (COLACE) 100 MG CAPSULE    Take 200 mg by mouth every evening.   FUROSEMIDE (LASIX) 20 MG TABLET    Take 20 mg by mouth.   GABAPENTIN (NEURONTIN) 300 MG CAPSULE    TAKE 1 CAPSULE BY MOUTH 3  TIMES DAILY   GLUCOSE BLOOD (ONE TOUCH ULTRA TEST) TEST STRIP    Use as instructed check blood sugar two times daily    INSULIN LISPRO PROT & LISPRO (HUMALOG MIX 75/25 KWIKPEN) (75-25) 100 UNIT/ML KWIKPEN    Inject 28 Units into the skin daily with breakfast.   LISINOPRIL (ZESTRIL) 10 MG TABLET    Take 10 mg by mouth daily.   MENTHOL, TOPICAL ANALGESIC, (STOPAIN ROLL-ON) 8 % LIQD    Apply 1 application topically 3 (three) times daily as needed (for knee pain.).   METHYLCELLULOSE, LAXATIVE, (HM FIBER) 500 MG TABS    Take 500 mg by mouth daily. Fiberwell Gummies    METOPROLOL SUCCINATE (TOPROL-XL) 25 MG 24 HR TABLET    TAKE 1/2 (ONE-HALF) TABLET BY MOUTH AT BEDTIME  MM PEN NEEDLES 31G X 8 MM MISC    USE AS DIRECTED WITH  VICTOZA   MULTIPLE VITAMIN (MULTIVITAMIN WITH MINERALS) TABS TABLET    Take 1 tablet by mouth every evening.    OMEGA-3-6-9 CAPS    Take 1 capsule by mouth every evening. 1600mg    OMEPRAZOLE (PRILOSEC) 20 MG CAPSULE    Take 1 capsule by mouth once daily   OXYCODONE-ACETAMINOPHEN (PERCOCET/ROXICET) 5-325 MG TABLET    Take 1 tablet by mouth every 4 (four) hours as needed for severe pain.   ROSUVASTATIN (CRESTOR) 5 MG TABLET    Take 1 tablet (5 mg total) by mouth daily.   SILDENAFIL (VIAGRA) 100 MG TABLET    TAKE 1 TABLET BY MOUTH ONCE DAILY AS NEEDED   SODIUM BICARBONATE PO    Take 1 tablet by mouth 2 (two) times daily.   SULFAMETHOXAZOLE-TRIMETHOPRIM (BACTRIM) 400-80 MG TABLET    Take 1 tablet by mouth 2 (two)  times daily.   TIZANIDINE (ZANAFLEX) 2 MG TABLET    Take 1 tablet (2 mg total) by mouth every 6 (six) hours as needed.   TRAZODONE (DESYREL) 100 MG TABLET    TAKE 1 TABLET BY MOUTH AT  BEDTIME   TROLAMINE SALICYLATE (ASPERCREME) 10 % CREAM    Apply 1 application topically as needed (knee pain).   VICTOZA 18 MG/3ML SOPN    INJECT 0.3 MLS (1.8 MG TOTAL) INTO THE SKIN EVERY MORNING   VITAMIN E 400 UNIT CAPSULE    Take 400 Units by mouth every evening.   Modified Medications   No medications on file  Discontinued Medications   No medications on file     Subjective: David Lindsey is in for his routine follow-up visit.  He underwent left total knee arthroplasty in November 2019.  He did well until mid January of this year when he had sudden onset of swelling, pain, redness and warmth in his left knee.  He started having fever and sweats.  He was evaluated on 05/04/2019 and was treated with cephalexin for cellulitis.  He did not improve and underwent arthrocentesis on 05/08/2019.  Doxycycline was added to his therapy.  Synovial fluid analyses showed 82,250 white blood cells of which 86% were segmented neutrophils.  No organisms were seen on Gram stain but culture grew Serratia.  He was admitted to the hospital and underwent incision and drainage with polyethylene exchange.  He was discharged on oral levofloxacin and completed 6 weeks of total therapy on 06/27/2019.  I switched him to oral trimethoprim sulfamethoxazole at that time.  He has had no trouble tolerating his antibiotics. He is feeling much better.   Review of Systems: Review of Systems  Constitutional: Negative for chills, diaphoresis and fever.  Gastrointestinal: Positive for constipation. Negative for abdominal pain, diarrhea, nausea and vomiting.  Musculoskeletal: Negative for joint pain.    Past Medical History:  Diagnosis Date  . ALCOHOLISM 12/22/2007  . ANEMIA, IRON DEFICIENCY 12/26/2007  . Arthritis    OA  . Blood transfusion without  reported diagnosis    as a child  . DIABETES MELLITUS, TYPE II 11/10/2006  . ESOPHAGITIS, REFLUX 12/22/2007  . GERD (gastroesophageal reflux disease)   . HYPERCHOLESTEROLEMIA 12/22/2007  . HYPERTENSION 11/10/2006  . HYPOGONADISM 12/22/2007   Prob. due to ETOH  . HYPOKALEMIA 06/06/2008  . Leukopenia   . Neuromuscular disorder ([place])    neuropathy in feet from Diabetes  . Neuropathy    Chronic Neuropathy BOTH FEET  . Pancytopenia 06/06/2008  .  RENAL INSUFFICIENCY 11/10/2006   SEES [place] KIDNEY STAGE 2 CKD    Social History   Tobacco Use  . Smoking status: Never Smoker  . Smokeless tobacco: Never Used  Vaping Use  . Vaping Use: Never used  Substance Use Topics  . Alcohol use: No    Alcohol/week: 0.0 standard drinks    Comment: LAST USE 10 YRS AGO  . Drug use: No    Family History  Problem Relation Age of Onset  . Cancer Mother        Colon Cancer  . Colon cancer Mother   . Colon polyps Brother   . Esophageal cancer Neg Hx   . Rectal cancer Neg Hx   . Stomach cancer Neg Hx     Allergies  Allergen Reactions  . Pioglitazone Swelling    Objective: Vitals:   10/18/19 1529  BP: (!) 144/76  Pulse: 82  Weight: 296 lb (134.3 kg)   Body mass index is 41.28 kg/m.  Physical Exam Constitutional:      Comments: His spirits are good.  Musculoskeletal:     Comments: His left knee incision has healed nicely. He has good range of motion.  Skin:    Findings: No rash.  Psychiatric:        Mood and Affect: Mood normal.     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  Based on current guidelines  we like to go with at least 6 months antibiotic treatment.  Plan at length today about the relative risks and benefits of continuing antibiotics longer.  Need to try our best to balance the benefit of continuing trimethoprim sulfamethoxazole against the risks (C. difficile colitis, worsening nephrotoxicity hyperkalemia) of continuing it.  He will meet with his orthopedic surgeon and nephrologist soon.  I will see him back following those appointments to help him decide if he is ready to stop antibiotic therapy.          Michel Bickers, MD [facility] for Infectious [place] [facility] [phone] pager   [phone] cell 10/18/2019, 3:52 PM

## 2019-10-18 NOTE — Addendum Note (Signed)
Addended by: Michel Bickers on: 10/18/2019 03:58 PM   Modules accepted: Orders

## 2019-10-19 ENCOUNTER — Other Ambulatory Visit: Payer: Self-pay

## 2019-10-19 DIAGNOSIS — N181 Chronic kidney disease, stage 1: Secondary | ICD-10-CM

## 2019-10-19 LAB — BASIC METABOLIC PANEL
BUN/Creatinine Ratio: 10 (calc) (ref 6–22)
BUN: 21 mg/dL (ref 7–25)
CO2: 26 mmol/L (ref 20–32)
Calcium: 9.5 mg/dL (ref 8.6–10.3)
Chloride: 103 mmol/L (ref 98–110)
Creat: 2.21 mg/dL — ABNORMAL HIGH (ref 0.70–1.25)
Glucose, Bld: 97 mg/dL (ref 65–99)
Potassium: 4.7 mmol/L (ref 3.5–5.3)
Sodium: 137 mmol/L (ref 135–146)

## 2019-10-19 LAB — CBC
HCT: 41 % (ref 38.5–50.0)
Hemoglobin: 13.6 g/dL (ref 13.2–17.1)
MCH: 28 pg (ref 27.0–33.0)
MCHC: 33.2 g/dL (ref 32.0–36.0)
MCV: 84.5 fL (ref 80.0–100.0)
MPV: 9 fL (ref 7.5–12.5)
Platelets: 171 10*3/uL (ref 140–400)
RBC: 4.85 10*6/uL (ref 4.20–5.80)
RDW: 13.4 % (ref 11.0–15.0)
WBC: 3.7 10*3/uL — ABNORMAL LOW (ref 3.8–10.8)

## 2019-10-19 LAB — SEDIMENTATION RATE: Sed Rate: 2 mm/h (ref 0–20)

## 2019-10-19 LAB — C-REACTIVE PROTEIN: CRP: 0.8 mg/L (ref <8.0)

## 2019-10-19 MED ORDER — DAPAGLIFLOZIN PROPANEDIOL 10 MG PO TABS: 10.0000 mg | ORAL_TABLET | Freq: Every day | ORAL | 3 refills | Status: DC

## 2019-10-19 MED ORDER — NOVOLOG MIX 70/30 FLEXPEN (70-30) 100 UNIT/ML ~~LOC~~ SUPN: 28.0000 [IU] | PEN_INJECTOR | Freq: Every day | SUBCUTANEOUS | 2 refills | Status: DC

## 2019-10-19 MED ORDER — VICTOZA 18 MG/3ML ~~LOC~~ SOPN: 1.8000 mg | PEN_INJECTOR | SUBCUTANEOUS | 1 refills | Status: DC

## 2019-10-19 NOTE — Telephone Encounter (Signed)
I am not seeing this medication on his list. In addition, he has been approved for Novolog 70/30 which IS on his list. He had originally been advised, rather than complete MULTIPLE forms for MULTIPLE medications, please keep your appointment for Wednesday so this can be further addressed. Please advise

## 2019-10-19 NOTE — Telephone Encounter (Signed)
Discussed verbally with Dr. Loanne Drilling about pt request to complete Novo Nordisk PAP for Victoza. Dr. Loanne Drilling agreed to complete and has advised pt will take the following insulin:   Novolog 70/30  Victoza  Dr. Loanne Drilling states he will need to discuss any other requested changes to pt regimen at his next visit. My Chart message sent to pt.  FAXED [place] Other records requested: None requested  All above requested information has been faxed successfully to [employer] listed above. Documents and fax confirmation have been placed in the faxed file for future reference.

## 2019-10-24 ENCOUNTER — Encounter: Payer: Self-pay | Admitting: Endocrinology

## 2019-10-24 ENCOUNTER — Encounter: Payer: Self-pay | Admitting: Pharmacist

## 2019-10-24 ENCOUNTER — Ambulatory Visit (INDEPENDENT_AMBULATORY_CARE_PROVIDER_SITE_OTHER): Payer: Medicare Other | Admitting: Endocrinology

## 2019-10-24 ENCOUNTER — Other Ambulatory Visit: Payer: Self-pay

## 2019-10-24 VITALS — BP 142/78 | HR 78 | Wt 293.8 lb

## 2019-10-24 DIAGNOSIS — N181 Chronic kidney disease, stage 1: Secondary | ICD-10-CM

## 2019-10-24 DIAGNOSIS — E1122 Type 2 diabetes mellitus with diabetic chronic kidney disease: Secondary | ICD-10-CM | POA: Diagnosis not present

## 2019-10-24 LAB — POCT GLYCOSYLATED HEMOGLOBIN (HGB A1C): Hemoglobin A1C: 7.1 % — AB (ref 4.0–5.6)

## 2019-10-24 MED ORDER — NOVOLOG MIX 70/30 FLEXPEN (70-30) 100 UNIT/ML ~~LOC~~ SUPN: 28.0000 [IU] | PEN_INJECTOR | Freq: Every day | SUBCUTANEOUS | 2 refills | Status: DC

## 2019-10-24 NOTE — Patient Instructions (Signed)
Please continue the same diabetes medications.   check your blood sugar twice a day.  vary the time of day when you check, between before the 3 meals, and at bedtime.  also check if you have symptoms of your blood sugar being too high or too low.  please keep a record of the readings and bring it to your next appointment here (or you can bring the meter itself).  You can write it on any piece of paper.  please call us sooner if your blood sugar goes below 70, or if you have a lot of readings over 200. Please come back for a follow-up appointment in 3 months.

## 2019-10-24 NOTE — Progress Notes (Signed)
Subjective:    Patient ID: David Lindsey, male    DOB: [birth year], 64 y.o.   MRN: [id number]  HPI Pt returns for f/u of diabetes mellitus: DM type: Insulin-requiring type 2.   Dx'ed: 4431 Complications: stage 4 CRI, and PN Therapy: insulin since 2019, Victoza, and [place].   DKA: never.   Severe hypoglycemia: never.   Pancreatitis: never.   Other: he did not tolerate parlodel (tremor); he cannot take pioglitizone (edema); he works 3rd shift; he takes qd insulin, after poor results with multiple daily injections; he is retired.   Interval history: no cbg record, but states cbg's vary from 100-200.  It is in general higher as the day goes on.  pt states he feels well in general.  Past Medical History:  Diagnosis Date  . ALCOHOLISM 12/22/2007  . ANEMIA, IRON DEFICIENCY 12/26/2007  . Arthritis    OA  . Blood transfusion without reported diagnosis    as a child  . DIABETES MELLITUS, TYPE II 11/10/2006  . ESOPHAGITIS, REFLUX 12/22/2007  . GERD (gastroesophageal reflux disease)   . HYPERCHOLESTEROLEMIA 12/22/2007  . HYPERTENSION 11/10/2006  . HYPOGONADISM 12/22/2007   Prob. due to ETOH  . HYPOKALEMIA 06/06/2008  . Leukopenia   . Neuromuscular disorder ([place])    neuropathy in feet from Diabetes  . Neuropathy    Chronic Neuropathy BOTH FEET  . Pancytopenia 06/06/2008  . RENAL INSUFFICIENCY 11/10/2006   SEES [place] KIDNEY STAGE 2 CKD    Past Surgical History:  Procedure Laterality Date  . APPENDECTOMY  AGE 65 OR 10  . COLONOSCOPY  08-21-2004   [facility]- normal  . COLONSCOPY  2017  . ELECTROCARDIOGRAM  11/07/2006  . ESOPHAGOGASTRODUODENOSCOPY  08/20/2004  . EYE SURGERY Bilateral 2014   IOC FOR CATARACTS  . I & D KNEE WITH POLY EXCHANGE Left 05/16/2019   Procedure: LEFT KNEE IRRIGATION AND DEBRIDEMENT WITH POLY EXCHANGE;  Surgeon: Frederik Pear, MD;  Location: [facility];  Service: Orthopedics;  Laterality: Left;  . REPLACEMENT TOTAL KNEE Right 2011  . TOTAL KNEE ARTHROPLASTY Left 02/20/2018    Procedure: LEFT TOTAL KNEE ARTHROPLASTY;  Surgeon: Frederik Pear, MD;  Location: [facility];  Service: Orthopedics;  Laterality: Left;  . UPPER GASTROINTESTINAL ENDOSCOPY      Social History   Socioeconomic History  . Marital status: Married    Spouse name: Not on file  . Number of children: Not on file  . Years of education: Not on file  . Highest education level: Not on file  Occupational History  . 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 Smoking status: Never Smoker  . Smokeless tobacco: Never Used  Vaping Use  . Vaping Use: Never used  Substance and Sexual Activity  . Alcohol use: No    Alcohol/week: 0.0 standard drinks    Comment: LAST USE 10 YRS AGO  . Drug use: No  . Sexual activity: Not on file  Other Topics Concern  . Not on file  Social History Narrative  . Not on file   Social Determinants of Health   Financial Resource Strain:   . Difficulty of Paying Living Expenses:   Food Insecurity:   . Worried About Charity fundraiser in the Last Year:   . Arboriculturist in the Last Year:   Transportation Needs:   . Film/video editor (Medical):   Marland Kitchen Lack of Transportation (Non-Medical):   Physical Activity:   . Days of Exercise  per Week:   . Minutes of Exercise per Session:   Stress:   . Feeling of Stress :   Social Connections:   . Frequency of Communication with Friends and Family:   . Frequency of Social Gatherings with Friends and Family:   . Attends Religious Services:   . Active Member of Clubs or Organizations:   . Attends Archivist Meetings:   Marland Kitchen Marital Status:   Intimate Partner Violence:   . Fear of Current or Ex-Partner:   . Emotionally Abused:   Marland Kitchen Physically Abused:   . Sexually Abused:     Current Outpatient Medications on File Prior to Visit  Medication Sig Dispense Refill  . amLODipine (NORVASC) 10 MG tablet TAKE 1 TABLET BY MOUTH EVERY DAY 90 tablet 1  . Apple Cider Vinegar 600 MG CAPS Take 600 mg  by mouth daily.    . Ascorbic Acid (VITAMIN C) 1000 MG tablet Take 1,000 mg by mouth every evening.    Marland Kitchen aspirin EC 81 MG tablet Take 1 tablet (81 mg total) by mouth 2 (two) times daily. 60 tablet 0  . Biotin w/ Vitamins C & E (HAIR SKIN & NAILS GUMMIES PO) Take 1 tablet by mouth every evening.    . Carboxymethylcellul-Glycerin (LUBRICATING EYE DROPS OP) Place 1 drop into both eyes daily as needed (dry eyes).    . cholecalciferol (VITAMIN D3) 25 MCG (1000 UNIT) tablet Take 1,000 Units by mouth daily.    . clotrimazole-betamethasone (LOTRISONE) cream APPLY  CREAM TOPICALLY TO AFFECTED AREA THREE TIMES DAILY AS NEEDED FOR  RASH (Patient taking differently: Apply 1 application topically 3 (three) times daily as needed (rash). ) 45 g 0  . Coenzyme Q10 (COQ-10) 200 MG CAPS Take 200 mg by mouth every evening.     . Cyanocobalamin (B-12) 5000 MCG CAPS Take 5,000 mcg by mouth every evening.     . dapagliflozin propanediol (FARXIGA) 10 MG TABS tablet Take 1 tablet (10 mg total) by mouth daily. 90 tablet 3  . docusate sodium (COLACE) 100 MG capsule Take 200 mg by mouth every evening.    . furosemide (LASIX) 20 MG tablet Take 20 mg by mouth.    . gabapentin (NEURONTIN) 300 MG capsule TAKE 1 CAPSULE BY MOUTH 3  TIMES DAILY 270 capsule 3  . glucose blood (ONE TOUCH ULTRA TEST) test strip Use as instructed check blood sugar two times daily     . liraglutide (VICTOZA) 18 MG/3ML SOPN Inject 0.3 mLs (1.8 mg total) into the skin every morning. 9 mL 1  . lisinopril (ZESTRIL) 10 MG tablet Take 10 mg by mouth daily.    . Menthol, Topical Analgesic, (STOPAIN ROLL-ON) 8 % LIQD Apply 1 application topically 3 (three) times daily as needed (for knee pain.).    Marland Kitchen Methylcellulose, Laxative, (HM FIBER) 500 MG TABS Take 500 mg by mouth daily. Fiberwell Gummies     . metoprolol succinate (TOPROL-XL) 25 MG 24 hr tablet TAKE 1/2 (ONE-HALF) TABLET BY MOUTH AT BEDTIME 45 tablet 2  . MM PEN NEEDLES 31G X 8 MM MISC USE AS DIRECTED  WITH  VICTOZA 100 each 0  . Multiple Vitamin (MULTIVITAMIN WITH MINERALS) TABS tablet Take 1 tablet by mouth every evening.     . Omega-3-6-9 CAPS Take 1 capsule by mouth every evening. 1600mg     . omeprazole (PRILOSEC) 20 MG capsule Take 1 capsule by mouth once daily 90 capsule 2  . oxyCODONE-acetaminophen (PERCOCET/ROXICET) 5-325 MG tablet Take 1  tablet by mouth every 4 (four) hours as needed for severe pain. 30 tablet 0  . rosuvastatin (CRESTOR) 5 MG tablet Take 1 tablet (5 mg total) by mouth daily. (Patient taking differently: Take 5 mg by mouth at bedtime. ) 90 tablet 3  . sildenafil (VIAGRA) 100 MG tablet TAKE 1 TABLET BY MOUTH ONCE DAILY AS NEEDED 6 tablet 2  . SODIUM BICARBONATE PO Take 1 tablet by mouth 2 (two) times daily.    Marland Kitchen sulfamethoxazole-trimethoprim (BACTRIM) 400-80 MG tablet Take 1 tablet by mouth 2 (two) times daily. 60 tablet 4  . tiZANidine (ZANAFLEX) 2 MG tablet Take 1 tablet (2 mg total) by mouth every 6 (six) hours as needed. 60 tablet 0  . traZODone (DESYREL) 100 MG tablet TAKE 1 TABLET BY MOUTH AT  BEDTIME (Patient taking differently: Take 100 mg by mouth at bedtime. ) 90 tablet 3  . trolamine salicylate (ASPERCREME) 10 % cream Apply 1 application topically as needed (knee pain).    . vitamin E 400 UNIT capsule Take 400 Units by mouth every evening.      No current facility-administered medications on file prior to visit.    Allergies  Allergen Reactions  . Pioglitazone Swelling    Family History  Problem Relation Age of Onset  . Cancer Mother        Colon Cancer  . Colon cancer Mother   . Colon polyps Brother   . Esophageal cancer Neg Hx   . Rectal cancer Neg Hx   . Stomach cancer Neg Hx     BP (!) 142/78 (BP Location: Left Arm, Patient Position: Sitting, Cuff Size: Large)   Pulse 78   Wt 293 lb 12.8 oz (133.3 kg)   SpO2 98%   BMI 40.98 kg/m    Review of Systems He denies hypoglycemia.      Objective:   Physical Exam VITAL SIGNS:  See vs  page GENERAL: no distress Pulses: dorsalis pedis intact bilat.   MSK: no deformity of the feet CV: 2+ left and 1+ right leg edema (venous doppler was neg after surgery).   Skin:  no ulcer on the feet.  normal color and temp on the feet. Neuro: sensation is intact to touch on the feet.   Ext: there is bilateral onychomycosis of the toenails.    Lab Results  Component Value Date   HGBA1C 7.1 (A) 10/24/2019   Lab Results  Component Value Date   CREATININE 2.21 (H) 10/18/2019   BUN 21 10/18/2019   NA 137 10/18/2019   K 4.7 10/18/2019   CL 103 10/18/2019   CO2 26 10/18/2019       Assessment & Plan:  Insulin-requiring type 2 DM, with stage 4 CRI: well-controlled  Patient Instructions  Please continue the same diabetes medications.   check your blood sugar twice a day.  vary the time of day when you check, between before the 3 meals, and at bedtime.  also check if you have symptoms of your blood sugar being too high or too low.  please keep a record of the readings and bring it to your next appointment here (or you can bring the meter itself).  You can write it on any piece of paper.  please call us sooner if your blood sugar goes below 70, or if you have a lot of readings over 200. Please come back for a follow-up appointment in 3 months.

## 2019-10-25 DIAGNOSIS — Z96652 Presence of left artificial knee joint: Secondary | ICD-10-CM | POA: Diagnosis not present

## 2019-10-25 DIAGNOSIS — T8454XA Infection and inflammatory reaction due to internal left knee prosthesis, initial encounter: Secondary | ICD-10-CM | POA: Diagnosis not present

## 2019-10-30 ENCOUNTER — Encounter: Payer: Self-pay | Admitting: Endocrinology

## 2019-10-31 ENCOUNTER — Encounter: Payer: Self-pay | Admitting: Endocrinology

## 2019-11-05 ENCOUNTER — Other Ambulatory Visit: Payer: Self-pay

## 2019-11-05 DIAGNOSIS — N181 Chronic kidney disease, stage 1: Secondary | ICD-10-CM

## 2019-11-05 MED ORDER — INSULIN LISPRO PROT & LISPRO (75-25 MIX) 100 UNIT/ML KWIKPEN: 28.0000 [IU] | PEN_INJECTOR | Freq: Every day | SUBCUTANEOUS | 11 refills | Status: DC

## 2019-11-05 NOTE — Telephone Encounter (Signed)
During my absence, [employer] application was erroneously completed for Victoza and faxed to [employer]. This is not a medication [employer]. In addition, Rx for Victoza and PAP was submitted to [employer] [facility] who DOES manage this medication. In addition, letter was received from [facility] confirming above documentation that they DO NOT manage Victoza. Clarification was requested from Dr. Loanne Drilling for the following:  Since pt last visit, Novolog 70/30 was ordered and the PAP for Novo Nordisk was completed for this medication. Approval letter has been received but pt is now requesting Humalog 75/25 in lieu of Novolog 70/30. After Dr. Loanne Drilling reviewed pt request, he has agreed to D/C NOVOLOG 70/30 and to order HUMALOG 75/25.  In light of the above mentioned error and at pt request as well as per Dr. Cordelia Pen orders, [employer] application has now been completed for Humalog 75/25 and has been faxed to [employer]. Confirmation has been received.

## 2019-11-06 ENCOUNTER — Telehealth: Payer: Self-pay

## 2019-11-06 NOTE — Telephone Encounter (Signed)
PATIENT ASSISTANCE PROGRAM - APPROVAL/DENIAL  Received notification from [facility] indicating application for Humalog 75/25 has been approved. Document has been labeled and placed in scans file for HIM scanning process and for our future reference. My Chart message sent to pt informing him about the letter of approval.

## 2019-11-08 NOTE — Telephone Encounter (Signed)
Please refer below 

## 2019-11-12 ENCOUNTER — Telehealth: Payer: Self-pay | Admitting: Endocrinology

## 2019-11-12 NOTE — Telephone Encounter (Signed)
[facility] called stating we sent them a Rx for victoza and it is written incorrect - they said we sent it in mL but it should be in mg.  Fax# [phone]

## 2019-11-12 NOTE — Telephone Encounter (Signed)
Once again, the same Rx form has been completed and has been faxed with a copy of the Epic Rx. Confirmation received.

## 2019-11-14 ENCOUNTER — Other Ambulatory Visit: Payer: Self-pay | Admitting: Family

## 2019-11-14 DIAGNOSIS — E291 Testicular hypofunction: Secondary | ICD-10-CM

## 2019-11-22 DIAGNOSIS — N189 Chronic kidney disease, unspecified: Secondary | ICD-10-CM | POA: Diagnosis not present

## 2019-11-22 DIAGNOSIS — N1832 Chronic kidney disease, stage 3b: Secondary | ICD-10-CM | POA: Diagnosis not present

## 2019-11-26 ENCOUNTER — Ambulatory Visit (INDEPENDENT_AMBULATORY_CARE_PROVIDER_SITE_OTHER): Payer: Medicare Other | Admitting: Internal Medicine

## 2019-11-26 ENCOUNTER — Encounter: Payer: Self-pay | Admitting: Internal Medicine

## 2019-11-26 ENCOUNTER — Other Ambulatory Visit: Payer: Self-pay

## 2019-11-26 DIAGNOSIS — N1832 Chronic kidney disease, stage 3b: Secondary | ICD-10-CM | POA: Diagnosis not present

## 2019-11-26 DIAGNOSIS — T8454XD Infection and inflammatory reaction due to internal left knee prosthesis, subsequent encounter: Secondary | ICD-10-CM

## 2019-11-26 DIAGNOSIS — E871 Hypo-osmolality and hyponatremia: Secondary | ICD-10-CM | POA: Diagnosis not present

## 2019-11-26 DIAGNOSIS — I129 Hypertensive chronic kidney disease with stage 1 through stage 4 chronic kidney disease, or unspecified chronic kidney disease: Secondary | ICD-10-CM | POA: Diagnosis not present

## 2019-11-26 DIAGNOSIS — T8459XD Infection and inflammatory reaction due to other internal joint prosthesis, subsequent encounter: Secondary | ICD-10-CM

## 2019-11-26 DIAGNOSIS — D631 Anemia in chronic kidney disease: Secondary | ICD-10-CM | POA: Diagnosis not present

## 2019-11-26 DIAGNOSIS — E872 Acidosis: Secondary | ICD-10-CM | POA: Diagnosis not present

## 2019-11-26 NOTE — Assessment & Plan Note (Signed)
It is quite possible that his infection has been cured through a combination of surgery and 7 months of antibiotic therapy.  Is good news that his inflammatory markers quickly returned to normal.  I talked to him again about the relative benefits and risks of continuing to take her Trimethoprim/Sulfamethoxazole.  I reminded him that the only way we would ever know if his infection is cured would be to stop antibiotic therapy and wait 6 to 8 weeks to see what happens.  He has been told his orthopedic surgeon that he should continue therapy indefinitely.  He has been told by his nephrologist that he should not take her Trimethoprim/Sulfamethoxazole for more than 2 more months.  He would like to continue it for now and follow-up in 2 months.

## 2019-11-26 NOTE — Progress Notes (Signed)
[facility]  Patient Active Problem List   Diagnosis Date Noted  . 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 CKD (chronic kidney disease) stage 3, GFR 30-59 ml/min 05/16/2019  . 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 Degenerative arthritis of left knee 11/18/2017  . Left shoulder pain 08/29/2017  . History of total right knee replacement (TKR) 03/25/2017  . Arthralgia 02/24/2017  . Edema 01/13/2017  . Wellness examination 01/30/2015  . Myalgia and myositis 01/28/2014  . Unspecified constipation 03/13/2013  . Screening for prostate cancer 01/18/2013  . Diabetes mellitus with renal manifestation ([place]) 01/18/2013  . Encounter for long-term (current) use of other medications 03/24/2012  . HYPOKALEMIA 06/06/2008  . Pancytopenia ([place]) 06/06/2008  . Hypogonadism, male 12/22/2007  . HYPERCHOLESTEROLEMIA 12/22/2007  . ALCOHOLISM 12/22/2007  . ESOPHAGITIS, REFLUX 12/22/2007  . Leg pain 12/22/2007  . Essential hypertension 11/10/2006  . Disorder resulting from impaired renal function 11/10/2006    Patient's Medications  New Prescriptions   No medications on file  Previous Medications   AMLODIPINE (NORVASC) 10 MG TABLET    TAKE 1 TABLET BY MOUTH EVERY DAY   APPLE CIDER VINEGAR 600 MG CAPS    Take 600 mg by mouth daily.   ASCORBIC ACID (VITAMIN C) 1000 MG TABLET    Take 1,000 mg by mouth every evening.   ASPIRIN EC 81 MG TABLET    Take 1 tablet (81 mg total) by mouth 2 (two) times daily.   BIOTIN W/ VITAMINS C & E (HAIR SKIN & NAILS GUMMIES PO)    Take 1 tablet by mouth every evening.   CARBOXYMETHYLCELLUL-GLYCERIN (LUBRICATING EYE DROPS OP)    Place 1 drop into both eyes daily as needed (dry eyes).   CHOLECALCIFEROL (VITAMIN D3) 25 MCG (1000 UNIT) TABLET    Take 1,000 Units by mouth daily.   CLOTRIMAZOLE-BETAMETHASONE (LOTRISONE) CREAM    APPLY  CREAM TOPICALLY TO AFFECTED AREA THREE TIMES DAILY AS NEEDED  FOR  RASH   COENZYME Q10 (COQ-10) 200 MG CAPS    Take 200 mg by mouth every evening.    CYANOCOBALAMIN (B-12) 5000 MCG CAPS    Take 5,000 mcg by mouth every evening.    DAPAGLIFLOZIN PROPANEDIOL (FARXIGA) 10 MG TABS TABLET    Take 1 tablet (10 mg total) by mouth daily.   DOCUSATE SODIUM (COLACE) 100 MG CAPSULE    Take 200 mg by mouth every evening.   FUROSEMIDE (LASIX) 20 MG TABLET    Take 20 mg by mouth.   GABAPENTIN (NEURONTIN) 300 MG CAPSULE    TAKE 1 CAPSULE BY MOUTH 3  TIMES DAILY   GLUCOSE BLOOD (ONE TOUCH ULTRA TEST) TEST STRIP    Use as instructed check blood sugar two times daily    INSULIN LISPRO PROT & LISPRO (HUMALOG MIX 75/25 KWIKPEN) (75-25) 100 UNIT/ML KWIKPEN    Inject 28 Units into the skin daily with breakfast. PATIENT USES [employer] PAP   LIRAGLUTIDE (VICTOZA) 18 MG/3ML SOPN    Inject 0.3 mLs (1.8 mg total) into the skin every morning.   LISINOPRIL (ZESTRIL) 10 MG TABLET    Take 10 mg by mouth daily.   MENTHOL, TOPICAL ANALGESIC, (STOPAIN ROLL-ON) 8 % LIQD    Apply 1 application topically 3 (three) times daily as needed (for knee pain.).   METHYLCELLULOSE, LAXATIVE, (HM FIBER) 500 MG TABS    Take 500 mg by  mouth daily. Fiberwell Gummies    METOPROLOL SUCCINATE (TOPROL-XL) 25 MG 24 HR TABLET    TAKE 1/2 (ONE-HALF) TABLET BY MOUTH AT BEDTIME   MM PEN NEEDLES 31G X 8 MM MISC    USE AS DIRECTED WITH  VICTOZA   MULTIPLE VITAMIN (MULTIVITAMIN WITH MINERALS) TABS TABLET    Take 1 tablet by mouth every evening.    OMEGA-3-6-9 CAPS    Take 1 capsule by mouth every evening. 1600mg    OMEPRAZOLE (PRILOSEC) 20 MG CAPSULE    Take 1 capsule by mouth once daily   OXYCODONE-ACETAMINOPHEN (PERCOCET/ROXICET) 5-325 MG TABLET    Take 1 tablet by mouth every 4 (four) hours as needed for severe pain.   ROSUVASTATIN (CRESTOR) 5 MG TABLET    Take 1 tablet (5 mg total) by mouth daily.   SILDENAFIL (VIAGRA) 100 MG TABLET    TAKE 1 TABLET BY MOUTH ONCE DAILY AS NEEDED   SODIUM BICARBONATE PO    Take 1  tablet by mouth 2 (two) times daily.   SULFAMETHOXAZOLE-TRIMETHOPRIM (BACTRIM) 400-80 MG TABLET    Take 1 tablet by mouth 2 (two) times daily.   TIZANIDINE (ZANAFLEX) 2 MG TABLET    Take 1 tablet (2 mg total) by mouth every 6 (six) hours as needed.   TRAZODONE (DESYREL) 100 MG TABLET    TAKE 1 TABLET BY MOUTH AT  BEDTIME   TROLAMINE SALICYLATE (ASPERCREME) 10 % CREAM    Apply 1 application topically as needed (knee pain).   VITAMIN E 400 UNIT CAPSULE    Take 400 Units by mouth every evening.   Modified Medications   No medications on file  Discontinued Medications   No medications on file     Subjective: David Lindsey is in for his routine follow-up visit.  He underwent left total knee arthroplasty in November 2019.  He did well until mid January of this year when he had sudden onset of swelling, pain, redness and warmth in his left knee.  He started having fever and sweats.  He was evaluated on 05/04/2019 and was treated with cephalexin for cellulitis.  He did not improve and underwent arthrocentesis on 05/08/2019.  Synovial fluid analyses showed 82,250 white blood cells of which 86% were segmented neutrophils.  No organisms were seen on Gram stain but culture grew Serratia.  He was admitted to the hospital and underwent incision and drainage with polyethylene exchange.  He was discharged on oral levofloxacin and completed 6 weeks of total therapy on 06/27/2019.  I switched him to oral trimethoprim sulfamethoxazole at that time.  He has had no trouble tolerating his antibiotics. He is feeling much better.  He will occasionally take acetaminophen for pain.  Review of Systems: Review of Systems  Constitutional: Negative for chills, diaphoresis and fever.  Gastrointestinal: Negative for abdominal pain, diarrhea, nausea and vomiting.  Musculoskeletal: Negative for joint pain.    Past Medical History:  Diagnosis Date  . ALCOHOLISM 12/22/2007  . ANEMIA, IRON DEFICIENCY 12/26/2007  . Arthritis    OA  .  Blood transfusion without reported diagnosis    as a child  . DIABETES MELLITUS, TYPE II 11/10/2006  . ESOPHAGITIS, REFLUX 12/22/2007  . GERD (gastroesophageal reflux disease)   . HYPERCHOLESTEROLEMIA 12/22/2007  . HYPERTENSION 11/10/2006  . HYPOGONADISM 12/22/2007   Prob. due to ETOH  . HYPOKALEMIA 06/06/2008  . Leukopenia   . Neuromuscular disorder ([place])    neuropathy in feet from Diabetes  . Neuropathy    Chronic Neuropathy BOTH  FEET  . Pancytopenia 06/06/2008  . RENAL INSUFFICIENCY 11/10/2006   SEES [place] KIDNEY STAGE 2 CKD    Social History   Tobacco Use  . Smoking status: Never Smoker  . Smokeless tobacco: Never Used  Vaping Use  . Vaping Use: Never used  Substance Use Topics  . Alcohol use: No    Alcohol/week: 0.0 standard drinks    Comment: LAST USE 10 YRS AGO  . Drug use: No    Family History  Problem Relation Age of Onset  . Cancer Mother        Colon Cancer  . Colon cancer Mother   . Colon polyps Brother   . Esophageal cancer Neg Hx   . Rectal cancer Neg Hx   . Stomach cancer Neg Hx     Allergies  Allergen Reactions  . Pioglitazone Swelling    Objective: Vitals:   11/26/19 1429  BP: 126/80  Pulse: 79  Temp: 98.1 F (36.7 C)  TempSrc: Oral  Weight: 292 lb (132.5 kg)   Body mass index is 40.73 kg/m.  Physical Exam Constitutional:      General: He is not in acute distress.    Comments: His spirits are good.  Musculoskeletal:     Comments: His left knee incision has healed nicely. He has good range of motion.  It is slightly warm to the touch.  Skin:    Findings: No rash.  Psychiatric:        Mood and Affect: Mood normal.     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  Is good news that his inflammatory markers quickly returned to normal.  I talked to him again about the relative benefits and risks of continuing to take her Trimethoprim/Sulfamethoxazole.  I reminded him that the only way we would ever know if his infection is cured would be to stop antibiotic therapy and wait 6 to 8 weeks to see what happens.  He has been told his orthopedic surgeon that he should continue therapy indefinitely.  He has been told by his nephrologist that he should not take her Trimethoprim/Sulfamethoxazole for more than 2 more months.  He would like to continue it for now and follow-up in 2 months.          Michel Bickers, MD [facility] [phone] pager   [phone] cell 11/26/2019, 3:04 PM

## 2019-11-27 ENCOUNTER — Other Ambulatory Visit: Payer: Self-pay | Admitting: Family

## 2019-11-27 MED ORDER — AMLODIPINE BESYLATE 10 MG PO TABS: 10.0000 mg | ORAL_TABLET | Freq: Every day | ORAL | 1 refills | Status: DC

## 2019-11-29 NOTE — Telephone Encounter (Signed)
Four boxes of Victoza have been received through [employer] PAP. Each box contains 3 pens apiece. Patient has been notified via mychart that the medication has been delivered, and it is available for pick up. Medication has been placed in sample fridge.

## 2019-12-07 ENCOUNTER — Other Ambulatory Visit: Payer: Self-pay

## 2019-12-07 ENCOUNTER — Ambulatory Visit (INDEPENDENT_AMBULATORY_CARE_PROVIDER_SITE_OTHER): Payer: Medicare Other | Admitting: *Deleted

## 2019-12-07 DIAGNOSIS — Z23 Encounter for immunization: Secondary | ICD-10-CM

## 2019-12-14 ENCOUNTER — Other Ambulatory Visit: Payer: Self-pay | Admitting: Family

## 2019-12-14 DIAGNOSIS — E291 Testicular hypofunction: Secondary | ICD-10-CM

## 2020-01-23 ENCOUNTER — Other Ambulatory Visit: Payer: Self-pay

## 2020-01-23 ENCOUNTER — Encounter: Payer: Self-pay | Admitting: Internal Medicine

## 2020-01-23 ENCOUNTER — Ambulatory Visit (INDEPENDENT_AMBULATORY_CARE_PROVIDER_SITE_OTHER): Payer: Medicare Other | Admitting: Internal Medicine

## 2020-01-23 DIAGNOSIS — Z96659 Presence of unspecified artificial knee joint: Secondary | ICD-10-CM

## 2020-01-23 DIAGNOSIS — T8459XD Infection and inflammatory reaction due to other internal joint prosthesis, subsequent encounter: Secondary | ICD-10-CM

## 2020-01-23 NOTE — Assessment & Plan Note (Signed)
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  His inflammatory markers have normalized.  I believe there is a very good chance his infection has been cured.  I am also concerned about the potential for worsening renal insufficiency if he continues trimethoprim sulfamethoxazole.  The only other oral agents his Serratia was susceptible to fluoroquinolones which also have a high rate of side effects with long-term use.  I talked to him at length about options on multiple visits and he is in agreement with stopping Trimethoprim/Sulfamethoxazole now.  He knows to call me if he has any signs of increasing inflammation in his left knee.  He will follow-up in 4 weeks.

## 2020-01-23 NOTE — Progress Notes (Addendum)
[facility] for Infectious Disease  Patient Active Problem List   Diagnosis Date Noted  . 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 CKD (chronic kidney disease) stage 3, GFR 30-59 ml/min (HCC) 05/16/2019  . 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 Degenerative arthritis of left knee 11/18/2017  . Left shoulder pain 08/29/2017  . History of total right knee replacement (TKR) 03/25/2017  . Arthralgia 02/24/2017  . Edema 01/13/2017  . Wellness examination 01/30/2015  . Myalgia and myositis 01/28/2014  . Unspecified constipation 03/13/2013  . Screening for prostate cancer 01/18/2013  . Diabetes mellitus with renal manifestation ([place]) 01/18/2013  . Encounter for long-term (current) use of other medications 03/24/2012  . HYPOKALEMIA 06/06/2008  . Pancytopenia ([place]) 06/06/2008  . Hypogonadism, male 12/22/2007  . HYPERCHOLESTEROLEMIA 12/22/2007  . ALCOHOLISM 12/22/2007  . ESOPHAGITIS, REFLUX 12/22/2007  . Leg pain 12/22/2007  . Essential hypertension 11/10/2006  . Disorder resulting from impaired renal function 11/10/2006    Patient's Medications  New Prescriptions   No medications on file  Previous Medications   AMLODIPINE (NORVASC) 10 MG TABLET    Take 1 tablet (10 mg total) by mouth daily.   APPLE CIDER VINEGAR 600 MG CAPS    Take 600 mg by mouth daily.   ASCORBIC ACID (VITAMIN C) 1000 MG TABLET    Take 1,000 mg by mouth every evening.   ASPIRIN EC 81 MG TABLET    Take 1 tablet (81 mg total) by mouth 2 (two) times daily.   BIOTIN W/ VITAMINS C & E (HAIR SKIN & NAILS GUMMIES PO)    Take 1 tablet by mouth every evening.   CARBOXYMETHYLCELLUL-GLYCERIN (LUBRICATING EYE DROPS OP)    Place 1 drop into both eyes daily as needed (dry eyes).   CHOLECALCIFEROL (VITAMIN D3) 25 MCG (1000 UNIT) TABLET    Take 1,000 Units by mouth daily.   CLOTRIMAZOLE-BETAMETHASONE (LOTRISONE) CREAM    APPLY  CREAM TOPICALLY TO AFFECTED AREA THREE TIMES  DAILY AS NEEDED FOR  RASH   COENZYME Q10 (COQ-10) 200 MG CAPS    Take 200 mg by mouth every evening.    CYANOCOBALAMIN (B-12) 5000 MCG CAPS    Take 5,000 mcg by mouth every evening.    DAPAGLIFLOZIN PROPANEDIOL (FARXIGA) 10 MG TABS TABLET    Take 1 tablet (10 mg total) by mouth daily.   DOCUSATE SODIUM (COLACE) 100 MG CAPSULE    Take 200 mg by mouth every evening.   FUROSEMIDE (LASIX) 20 MG TABLET    Take 20 mg by mouth.   GABAPENTIN (NEURONTIN) 300 MG CAPSULE    TAKE 1 CAPSULE BY MOUTH 3  TIMES DAILY   GLUCOSE BLOOD (ONE TOUCH ULTRA TEST) TEST STRIP    Use as instructed check blood sugar two times daily    INSULIN LISPRO PROT & LISPRO (HUMALOG MIX 75/25 KWIKPEN) (75-25) 100 UNIT/ML KWIKPEN    Inject 28 Units into the skin daily with breakfast. PATIENT USES LILLY CARES PAP   LIRAGLUTIDE (VICTOZA) 18 MG/3ML SOPN    Inject 0.3 mLs (1.8 mg total) into the skin every morning.   LISINOPRIL (ZESTRIL) 10 MG TABLET    Take 10 mg by mouth daily.   MENTHOL, TOPICAL ANALGESIC, (STOPAIN ROLL-ON) 8 % LIQD    Apply 1 application topically 3 (three) times daily as needed (for knee pain.).   METHYLCELLULOSE, LAXATIVE, (HM FIBER) 500 MG TABS    Take  500 mg by mouth daily. Fiberwell Gummies    METOPROLOL SUCCINATE (TOPROL-XL) 25 MG 24 HR TABLET    TAKE 1/2 (ONE-HALF) TABLET BY MOUTH AT BEDTIME   MM PEN NEEDLES 31G X 8 MM MISC    USE AS DIRECTED WITH  VICTOZA   MULTIPLE VITAMIN (MULTIVITAMIN WITH MINERALS) TABS TABLET    Take 1 tablet by mouth every evening.    OMEGA-3-6-9 CAPS    Take 1 capsule by mouth every evening. 1600mg    OMEPRAZOLE (PRILOSEC) 20 MG CAPSULE    Take 1 capsule by mouth once daily   OXYCODONE-ACETAMINOPHEN (PERCOCET/ROXICET) 5-325 MG TABLET    Take 1 tablet by mouth every 4 (four) hours as needed for severe pain.   ROSUVASTATIN (CRESTOR) 5 MG TABLET    Take 1 tablet (5 mg total) by mouth daily.   SILDENAFIL (VIAGRA) 100 MG TABLET    TAKE 1 TABLET BY MOUTH ONCE DAILY AS NEEDED   SODIUM  BICARBONATE PO    Take 1 tablet by mouth 2 (two) times daily.   TIZANIDINE (ZANAFLEX) 2 MG TABLET    Take 1 tablet (2 mg total) by mouth every 6 (six) hours as needed.   TRAZODONE (DESYREL) 100 MG TABLET    TAKE 1 TABLET BY MOUTH AT  BEDTIME   TROLAMINE SALICYLATE (ASPERCREME) 10 % CREAM    Apply 1 application topically as needed (knee pain).   VITAMIN E 400 UNIT CAPSULE    Take 400 Units by mouth every evening.   Modified Medications   No medications on file  Discontinued Medications   SULFAMETHOXAZOLE-TRIMETHOPRIM (BACTRIM) 400-80 MG TABLET    Take 1 tablet by mouth 2 (two) times daily.     Subjective: Bonner is in for his routine follow-up visit.  He underwent left total knee arthroplasty in November 2019.  He did well until mid January of this year when he had sudden onset of swelling, pain, redness and warmth in his left knee.  He started having fever and sweats.  He was evaluated on 05/04/2019 and was treated with cephalexin for cellulitis.  He did not improve and underwent arthrocentesis on 05/08/2019.  Synovial fluid analyses showed 82,250 white blood cells of which 86% were segmented neutrophils.  No organisms were seen on Gram stain but culture grew Serratia.  He was admitted to the hospital and underwent incision and drainage with polyethylene exchange.  He was discharged on oral levofloxacin and completed 6 weeks of total therapy on 06/27/2019.  I switched him to oral trimethoprim sulfamethoxazole at that time.  He has had no trouble tolerating his antibiotics although Simona Huh and his nephrologist have concerns about potential negative impact Trimethoprim/Sulfamethoxazole on his chronic renal insufficiency.  I cut the dose of his Trimethoprim/Sulfamethoxazole and a half several months ago.  He is feeling much better.  He will occasionally take acetaminophen for pain .  Review of Systems: Review of Systems  Constitutional: Negative for chills, diaphoresis and fever.  Gastrointestinal:  Negative for abdominal pain, diarrhea, nausea and vomiting.  Musculoskeletal: Negative for joint pain.    Past Medical History:  Diagnosis Date  . ALCOHOLISM 12/22/2007  . ANEMIA, IRON DEFICIENCY 12/26/2007  . Arthritis    OA  . Blood transfusion without reported diagnosis    as a child  . DIABETES MELLITUS, TYPE II 11/10/2006  . ESOPHAGITIS, REFLUX 12/22/2007  . GERD (gastroesophageal reflux disease)   . HYPERCHOLESTEROLEMIA 12/22/2007  . HYPERTENSION 11/10/2006  . HYPOGONADISM 12/22/2007   Prob. due to ETOH  .  HYPOKALEMIA 06/06/2008  . Leukopenia   . Neuromuscular disorder ([place])    neuropathy in feet from Diabetes  . Neuropathy    Chronic Neuropathy BOTH FEET  . Pancytopenia 06/06/2008  . RENAL INSUFFICIENCY 11/10/2006   SEES [facility] STAGE 2 CKD    Social History   Tobacco Use  . Smoking status: Never Smoker  . Smokeless tobacco: Never Used  Vaping Use  . Vaping Use: Never used  Substance Use Topics  . Alcohol use: No    Alcohol/week: 0.0 standard drinks    Comment: LAST USE 10 YRS AGO  . Drug use: No    Family History  Problem Relation Age of Onset  . Cancer Mother        Colon Cancer  . Colon cancer Mother   . Colon polyps Brother   . Esophageal cancer Neg Hx   . Rectal cancer Neg Hx   . Stomach cancer Neg Hx     Allergies  Allergen Reactions  . Pioglitazone Swelling    Objective: Vitals:   01/23/20 1026  BP: (!) 147/80  Pulse: 84  SpO2: 98%  Weight: 296 lb (134.3 kg)   Body mass index is 41.28 kg/m.  Physical Exam Constitutional:      General: He is not in acute distress.    Comments: His spirits are good.  Musculoskeletal:     Comments: His left knee incision has healed nicely. He has good range of motion.    Skin:    Findings: No rash.  Psychiatric:        Mood and Affect: Mood normal.     Lab Results Sed Rate (mm/h)  Date Value  10/18/2019 2  09/04/2019 6  06/27/2019 38 (H)   CRP (mg/L)  Date Value  10/18/2019 0.8    09/04/2019 0.9  06/27/2019 7.3   BMET    Component Value Date/Time   NA 137 10/18/2019 1600   K 4.7 10/18/2019 1600   CL 103 10/18/2019 1600   CO2 26 10/18/2019 1600   GLUCOSE 97 10/18/2019 1600   BUN 21 10/18/2019 1600   CREATININE 2.21 (H) 10/18/2019 1600   CALCIUM 9.5 10/18/2019 1600   GFRNONAA 38 (L) 05/17/2019 0421   GFRAA 44 (L) 05/17/2019 0421     Problem List Items Addressed This Visit      High   Infection of total knee replacement (HCC)    It appears that his prosthetic knee infection has responded well to incision and drainage with polyexchange in January followed by 9 months of antibiotic therapy.  His inflammatory markers have normalized.  I believe there is a very good chance his infection has been cured.  I am also concerned about the potential for worsening renal insufficiency if he continues trimethoprim sulfamethoxazole.  The only other oral agents his Serratia was susceptible to fluoroquinolones which also have a high rate of side effects with long-term use.  I talked to him at length about options on multiple visits and he is in agreement with stopping Trimethoprim/Sulfamethoxazole now.  He knows to call me if he has any signs of increasing inflammation in his left knee.  He will follow-up in 4 weeks.          Michel Bickers, MD [facility] [phone] pager   [phone] cell 02/19/2020, 10:07 AM

## 2020-01-28 DIAGNOSIS — H0102B Squamous blepharitis left eye, upper and lower eyelids: Secondary | ICD-10-CM | POA: Diagnosis not present

## 2020-01-28 DIAGNOSIS — Z794 Long term (current) use of insulin: Secondary | ICD-10-CM | POA: Diagnosis not present

## 2020-01-28 DIAGNOSIS — H16223 Keratoconjunctivitis sicca, not specified as Sjogren's, bilateral: Secondary | ICD-10-CM | POA: Diagnosis not present

## 2020-01-28 DIAGNOSIS — H524 Presbyopia: Secondary | ICD-10-CM | POA: Diagnosis not present

## 2020-01-28 DIAGNOSIS — H0102A Squamous blepharitis right eye, upper and lower eyelids: Secondary | ICD-10-CM | POA: Diagnosis not present

## 2020-01-28 DIAGNOSIS — E119 Type 2 diabetes mellitus without complications: Secondary | ICD-10-CM | POA: Diagnosis not present

## 2020-01-28 LAB — HM DIABETES EYE EXAM

## 2020-01-29 ENCOUNTER — Other Ambulatory Visit: Payer: Self-pay | Admitting: Family

## 2020-02-12 ENCOUNTER — Other Ambulatory Visit: Payer: Self-pay | Admitting: Endocrinology

## 2020-02-20 ENCOUNTER — Encounter: Payer: Self-pay | Admitting: Internal Medicine

## 2020-02-20 ENCOUNTER — Ambulatory Visit (INDEPENDENT_AMBULATORY_CARE_PROVIDER_SITE_OTHER): Payer: Medicare Other | Admitting: Internal Medicine

## 2020-02-20 ENCOUNTER — Other Ambulatory Visit: Payer: Self-pay

## 2020-02-20 DIAGNOSIS — T8459XD Infection and inflammatory reaction due to other internal joint prosthesis, subsequent encounter: Secondary | ICD-10-CM

## 2020-02-20 DIAGNOSIS — Z96659 Presence of unspecified artificial knee joint: Secondary | ICD-10-CM | POA: Diagnosis not present

## 2020-02-20 NOTE — Assessment & Plan Note (Signed)
I am quite hopeful that his prosthetic knee infection has been cured.  He will stay off of antibiotics and follow-up here in 2 months.

## 2020-02-20 NOTE — Progress Notes (Signed)
[facility] for Infectious Disease  Patient Active Problem List   Diagnosis Date Noted  . 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 CKD (chronic kidney disease) stage 3, GFR 30-59 ml/min (HCC) 05/16/2019  . 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 Degenerative arthritis of left knee 11/18/2017  . Left shoulder pain 08/29/2017  . History of total right knee replacement (TKR) 03/25/2017  . Arthralgia 02/24/2017  . Edema 01/13/2017  . Wellness examination 01/30/2015  . Myalgia and myositis 01/28/2014  . Unspecified constipation 03/13/2013  . Screening for prostate cancer 01/18/2013  . Diabetes mellitus with renal manifestation ([place]) 01/18/2013  . Encounter for long-term (current) use of other medications 03/24/2012  . HYPOKALEMIA 06/06/2008  . Pancytopenia ([place]) 06/06/2008  . Hypogonadism, male 12/22/2007  . HYPERCHOLESTEROLEMIA 12/22/2007  . ALCOHOLISM 12/22/2007  . ESOPHAGITIS, REFLUX 12/22/2007  . Leg pain 12/22/2007  . Essential hypertension 11/10/2006  . Disorder resulting from impaired renal function 11/10/2006    Patient's Medications  New Prescriptions   No medications on file  Previous Medications   AMLODIPINE (NORVASC) 10 MG TABLET    Take 1 tablet (10 mg total) by mouth daily.   APPLE CIDER VINEGAR 600 MG CAPS    Take 600 mg by mouth daily.   ASCORBIC ACID (VITAMIN C) 1000 MG TABLET    Take 1,000 mg by mouth every evening.   ASPIRIN EC 81 MG TABLET    Take 1 tablet (81 mg total) by mouth 2 (two) times daily.   BIOTIN W/ VITAMINS C & E (HAIR SKIN & NAILS GUMMIES PO)    Take 1 tablet by mouth every evening.   CARBOXYMETHYLCELLUL-GLYCERIN (LUBRICATING EYE DROPS OP)    Place 1 drop into both eyes daily as needed (dry eyes).    CHOLECALCIFEROL (VITAMIN D3) 25 MCG (1000 UNIT) TABLET    Take 1,000 Units by mouth daily.   CLOTRIMAZOLE-BETAMETHASONE (LOTRISONE) CREAM    APPLY  CREAM TOPICALLY TO AFFECTED AREA THREE TIMES  DAILY AS NEEDED FOR  RASH   COENZYME Q10 (COQ-10) 200 MG CAPS    Take 200 mg by mouth every evening.    CYANOCOBALAMIN (B-12) 5000 MCG CAPS    Take 5,000 mcg by mouth every evening.    DAPAGLIFLOZIN PROPANEDIOL (FARXIGA) 10 MG TABS TABLET    Take 1 tablet (10 mg total) by mouth daily.   DOCUSATE SODIUM (COLACE) 100 MG CAPSULE    Take 200 mg by mouth every evening.    FUROSEMIDE (LASIX) 20 MG TABLET    Take 20 mg by mouth.   GABAPENTIN (NEURONTIN) 300 MG CAPSULE    TAKE 1 CAPSULE BY MOUTH 3  TIMES DAILY   GLUCOSE BLOOD (ONE TOUCH ULTRA TEST) TEST STRIP    Use as instructed check blood sugar two times daily    HUMALOG KWIKPEN 100 UNIT/ML KWIKPEN    Inject into the skin.   INSULIN LISPRO PROT & LISPRO (HUMALOG MIX 75/25 KWIKPEN) (75-25) 100 UNIT/ML KWIKPEN    Inject 28 Units into the skin daily with breakfast. PATIENT USES LILLY CARES PAP   LIRAGLUTIDE (VICTOZA) 18 MG/3ML SOPN    Inject 0.3 mLs (1.8 mg total) into the skin every morning.   LISINOPRIL (ZESTRIL) 10 MG TABLET    Take 10 mg by mouth daily.   MENTHOL, TOPICAL ANALGESIC, (STOPAIN ROLL-ON) 8 % LIQD    Apply 1 application topically 3 (three) times daily as needed (  for knee pain.).   METHYLCELLULOSE, LAXATIVE, (HM FIBER) 500 MG TABS    Take 500 mg by mouth daily. Fiberwell Gummies    METOPROLOL SUCCINATE (TOPROL-XL) 25 MG 24 HR TABLET    TAKE 1/2 (ONE-HALF) TABLET BY MOUTH AT BEDTIME   MM PEN NEEDLES 31G X 8 MM MISC    USE AS DIRECTED WITH  VICTOZA   MULTIPLE VITAMIN (MULTIVITAMIN WITH MINERALS) TABS TABLET    Take 1 tablet by mouth every evening.    OMEGA-3-6-9 CAPS    Take 1 capsule by mouth every evening. 1600mg    OMEPRAZOLE (PRILOSEC) 20 MG CAPSULE    Take 1 capsule by mouth once daily   OXYCODONE-ACETAMINOPHEN (PERCOCET/ROXICET) 5-325 MG TABLET    Take 1 tablet by mouth every 4 (four) hours as needed for severe pain.   ROSUVASTATIN (CRESTOR) 5 MG TABLET    Take 1 tablet (5 mg total) by mouth daily.   SILDENAFIL (VIAGRA) 100 MG TABLET     TAKE 1 TABLET BY MOUTH ONCE DAILY AS NEEDED   SODIUM BICARBONATE PO    Take 1 tablet by mouth 2 (two) times daily.   TIZANIDINE (ZANAFLEX) 2 MG TABLET    Take 1 tablet (2 mg total) by mouth every 6 (six) hours as needed.   TRAZODONE (DESYREL) 100 MG TABLET    TAKE 1 TABLET BY MOUTH AT  BEDTIME   TROLAMINE SALICYLATE (ASPERCREME) 10 % CREAM    Apply 1 application topically as needed (knee pain).   VITAMIN E 400 UNIT CAPSULE    Take 400 Units by mouth every evening.   Modified Medications   No medications on file  Discontinued Medications   No medications on file    Subjective: David Lindsey is in for his routine follow-up visit.  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  He says he has a little bit of numbness along his left lateral calf but otherwise is feeling well.  He rates his left knee pain as 0-1.  Review of Systems: Review of Systems  Constitutional: Negative for chills, diaphoresis and fever.  Gastrointestinal: Negative for abdominal pain, diarrhea, nausea and vomiting.  Musculoskeletal: Positive for joint pain.    Past Medical History:  Diagnosis Date  . ALCOHOLISM 12/22/2007  . ANEMIA, IRON DEFICIENCY 12/26/2007  . Arthritis    OA  . Blood transfusion without reported diagnosis    as a child  . DIABETES MELLITUS, TYPE II 11/10/2006  . ESOPHAGITIS, REFLUX 12/22/2007  . GERD (gastroesophageal reflux disease)   . HYPERCHOLESTEROLEMIA 12/22/2007  . HYPERTENSION 11/10/2006  . HYPOGONADISM 12/22/2007   Prob. due to ETOH  . HYPOKALEMIA 06/06/2008  . Leukopenia   . Neuromuscular disorder ([place])    neuropathy in feet from Diabetes  . Neuropathy    Chronic Neuropathy BOTH FEET  . Pancytopenia 06/06/2008  . RENAL INSUFFICIENCY 11/10/2006   SEES [facility] KIDNEY STAGE 2 CKD    Social History   Tobacco Use  . Smoking status: Never Smoker  . Smokeless tobacco: Never Used  Vaping Use  . Vaping Use: Never used   Substance Use Topics  . Alcohol use: No    Alcohol/week: 0.0 standard drinks    Comment: LAST USE 10 YRS AGO  . Drug use: No    Family History  Problem Relation Age of Onset  . Cancer Mother        Colon Cancer  . Colon cancer Mother   .  Colon polyps Brother   . Esophageal cancer Neg Hx   . Rectal cancer Neg Hx   . Stomach cancer Neg Hx     Allergies  Allergen Reactions  . Pioglitazone Swelling    Objective: Vitals:   02/20/20 1015  BP: 126/71  Pulse: 73  Temp: 97.8 F (36.6 C)  TempSrc: Oral  SpO2: 98%  Weight: 298 lb (135.2 kg)  Height: 5\' 11"  (1.803 m)   Body mass index is 41.56 kg/m.  Physical Exam Constitutional:      Comments: His spirits are good.  Musculoskeletal:        General: No swelling or tenderness.     Comments: His left knee incision is nicely healed.  He has slight warmth that is stable.  He has no unusual swelling or redness.  He has good range of motion.       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  He will stay off of antibiotics and follow-up here in 2 months.          Michel Bickers, MD [facility] [place] [facility] [phone] pager   [phone] cell 02/20/2020, 10:36 AM

## 2020-02-27 ENCOUNTER — Other Ambulatory Visit: Payer: Self-pay

## 2020-02-27 ENCOUNTER — Ambulatory Visit (INDEPENDENT_AMBULATORY_CARE_PROVIDER_SITE_OTHER): Payer: Medicare Other | Admitting: Endocrinology

## 2020-02-27 ENCOUNTER — Encounter: Payer: Self-pay | Admitting: Endocrinology

## 2020-02-27 VITALS — BP 140/80 | HR 87 | Ht 71.0 in | Wt 297.0 lb

## 2020-02-27 DIAGNOSIS — E1122 Type 2 diabetes mellitus with diabetic chronic kidney disease: Secondary | ICD-10-CM

## 2020-02-27 DIAGNOSIS — N181 Chronic kidney disease, stage 1: Secondary | ICD-10-CM | POA: Diagnosis not present

## 2020-02-27 LAB — POCT GLYCOSYLATED HEMOGLOBIN (HGB A1C): Hemoglobin A1C: 6.4 % — AB (ref 4.0–5.6)

## 2020-02-27 MED ORDER — INSULIN LISPRO PROT & LISPRO (75-25 MIX) 100 UNIT/ML KWIKPEN: 22.0000 [IU] | PEN_INJECTOR | Freq: Every day | SUBCUTANEOUS | 11 refills | Status: DC

## 2020-02-27 NOTE — Progress Notes (Signed)
Subjective:    Patient ID: David Lindsey, male    DOB: February 09, 1956, 64 y.o.   MRN: [id number]  HPI Pt returns for f/u of diabetes mellitus: DM type: Insulin-requiring type 2.   Dx'ed: 8032 Complications: stage 4 CRI, and PN Therapy: insulin since 2019, Victoza, and [place].   DKA: never.   Severe hypoglycemia: never.   Pancreatitis: never.   SDOH: he gets meds from [facility]--pt assistance Other: he did not tolerate parlodel (tremor); he cannot take pioglitizone (edema); he works 3rd shift; he takes qd insulin, after poor results with multiple daily injections; he is retired.   Interval history: no cbg record, but states cbg's vary from 94-200.  It is in general higher as the day goes on.  pt states he feels well in general.   Past Medical History:  Diagnosis Date  . ALCOHOLISM 12/22/2007  . ANEMIA, IRON DEFICIENCY 12/26/2007  . Arthritis    OA  . Blood transfusion without reported diagnosis    as a child  . DIABETES MELLITUS, TYPE II 11/10/2006  . ESOPHAGITIS, REFLUX 12/22/2007  . GERD (gastroesophageal reflux disease)   . HYPERCHOLESTEROLEMIA 12/22/2007  . HYPERTENSION 11/10/2006  . HYPOGONADISM 12/22/2007   Prob. due to ETOH  . HYPOKALEMIA 06/06/2008  . Leukopenia   . Neuromuscular disorder ([place])    neuropathy in feet from Diabetes  . Neuropathy    Chronic Neuropathy BOTH FEET  . Pancytopenia 06/06/2008  . RENAL INSUFFICIENCY 11/10/2006   SEES [place] [facility] STAGE 2 CKD    Past Surgical History:  Procedure Laterality Date  . APPENDECTOMY  AGE 69 OR 10  . COLONOSCOPY  08-21-2004   [facility]- normal  . COLONSCOPY  2017  . ELECTROCARDIOGRAM  11/07/2006  . ESOPHAGOGASTRODUODENOSCOPY  08/20/2004  . EYE SURGERY Bilateral 2014   [facility] FOR CATARACTS  . I & D KNEE WITH POLY EXCHANGE Left 05/16/2019   Procedure: LEFT KNEE IRRIGATION AND DEBRIDEMENT WITH POLY EXCHANGE;  Surgeon: Frederik Pear, MD;  Location: [facility];  Service: Orthopedics;  Laterality: Left;  . REPLACEMENT TOTAL KNEE Right 2011   . TOTAL KNEE ARTHROPLASTY Left 02/20/2018   Procedure: LEFT TOTAL KNEE ARTHROPLASTY;  Surgeon: Frederik Pear, MD;  Location: [facility];  Service: Orthopedics;  Laterality: Left;  . UPPER GASTROINTESTINAL ENDOSCOPY      Social History   Socioeconomic History  . Marital status: Married    Spouse name: Not on file  . Number of children: Not on file  . Years of education: Not on file  . Highest education level: Not on file  Occupational History  . 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 Smoking status: Never Smoker  . Smokeless tobacco: Never Used  Vaping Use  . Vaping Use: Never used  Substance and Sexual Activity  . Alcohol use: No    Alcohol/week: 0.0 standard drinks    Comment: LAST USE 10 YRS AGO  . Drug use: No  . Sexual activity: Not on file  Other Topics Concern  . Not on file  Social History Narrative  . Not on file   Social Determinants of Health   Financial Resource Strain:   . Difficulty of Paying Living Expenses: Not on file  Food Insecurity:   . Worried About Charity fundraiser in the Last Year: Not on file  . Ran Out of Food in the Last Year: Not on file  Transportation Needs:   . Lack of Transportation (Medical): Not on file  .  Lack of Transportation (Non-Medical): Not on file  Physical Activity:   . Days of Exercise per Week: Not on file  . Minutes of Exercise per Session: Not on file  Stress:   . Feeling of Stress : Not on file  Social Connections:   . Frequency of Communication with Friends and Family: Not on file  . Frequency of Social Gatherings with Friends and Family: Not on file  . Attends Religious Services: Not on file  . Active Member of Clubs or Organizations: Not on file  . Attends Archivist Meetings: Not on file  . Marital Status: Not on file  Intimate Partner Violence:   . Fear of Current or Ex-Partner: Not on file  . Emotionally Abused: Not on file  . Physically Abused: Not on file  . Sexually  Abused: Not on file    Current Outpatient Medications on File Prior to Visit  Medication Sig Dispense Refill  . amLODipine (NORVASC) 10 MG tablet Take 1 tablet (10 mg total) by mouth daily. 90 tablet 1  . Apple Cider Vinegar 600 MG CAPS Take 600 mg by mouth daily.     . Ascorbic Acid (VITAMIN C) 1000 MG tablet Take 1,000 mg by mouth every evening.    Marland Kitchen aspirin EC 81 MG tablet Take 1 tablet (81 mg total) by mouth 2 (two) times daily. 60 tablet 0  . Biotin w/ Vitamins C & E (HAIR SKIN & NAILS GUMMIES PO) Take 1 tablet by mouth every evening.    . Carboxymethylcellul-Glycerin (LUBRICATING EYE DROPS OP) Place 1 drop into both eyes daily as needed (dry eyes).     . cholecalciferol (VITAMIN D3) 25 MCG (1000 UNIT) tablet Take 1,000 Units by mouth daily.    . clotrimazole-betamethasone (LOTRISONE) cream APPLY  CREAM TOPICALLY TO AFFECTED AREA THREE TIMES DAILY AS NEEDED FOR  RASH 45 g 0  . Coenzyme Q10 (COQ-10) 200 MG CAPS Take 200 mg by mouth every evening.     . Cyanocobalamin (B-12) 5000 MCG CAPS Take 5,000 mcg by mouth every evening.     . dapagliflozin propanediol (FARXIGA) 10 MG TABS tablet Take 1 tablet (10 mg total) by mouth daily. 90 tablet 3  . docusate sodium (COLACE) 100 MG capsule Take 200 mg by mouth every evening.     . furosemide (LASIX) 20 MG tablet Take 20 mg by mouth.    . gabapentin (NEURONTIN) 300 MG capsule TAKE 1 CAPSULE BY MOUTH 3  TIMES DAILY 270 capsule 3  . glucose blood (ONE TOUCH ULTRA TEST) test strip Use as instructed check blood sugar two times daily     . liraglutide (VICTOZA) 18 MG/3ML SOPN Inject 0.3 mLs (1.8 mg total) into the skin every morning. 9 mL 1  . lisinopril (ZESTRIL) 10 MG tablet Take 10 mg by mouth daily.    . Menthol, Topical Analgesic, (STOPAIN ROLL-ON) 8 % LIQD Apply 1 application topically 3 (three) times daily as needed (for knee pain.).     Marland Kitchen Methylcellulose, Laxative, (HM FIBER) 500 MG TABS Take 500 mg by mouth daily. Fiberwell Gummies     .  metoprolol succinate (TOPROL-XL) 25 MG 24 hr tablet TAKE 1/2 (ONE-HALF) TABLET BY MOUTH AT BEDTIME 45 tablet 2  . MM PEN NEEDLES 31G X 8 MM MISC USE AS DIRECTED WITH  VICTOZA 100 each 0  . Multiple Vitamin (MULTIVITAMIN WITH MINERALS) TABS tablet Take 1 tablet by mouth every evening.     . Omega-3-6-9 CAPS Take 1 capsule  by mouth every evening. 1600mg     . omeprazole (PRILOSEC) 20 MG capsule Take 1 capsule by mouth once daily 90 capsule 2  . oxyCODONE-acetaminophen (PERCOCET/ROXICET) 5-325 MG tablet Take 1 tablet by mouth every 4 (four) hours as needed for severe pain. 30 tablet 0  . rosuvastatin (CRESTOR) 5 MG tablet Take 1 tablet (5 mg total) by mouth daily. 90 tablet 3  . sildenafil (VIAGRA) 100 MG tablet TAKE 1 TABLET BY MOUTH ONCE DAILY AS NEEDED 6 tablet 2  . SODIUM BICARBONATE PO Take 1 tablet by mouth 2 (two) times daily.     Marland Kitchen tiZANidine (ZANAFLEX) 2 MG tablet Take 1 tablet (2 mg total) by mouth every 6 (six) hours as needed. 60 tablet 0  . traZODone (DESYREL) 100 MG tablet TAKE 1 TABLET BY MOUTH AT  BEDTIME 90 tablet 3  . trolamine salicylate (ASPERCREME) 10 % cream Apply 1 application topically as needed (knee pain).    . vitamin E 400 UNIT capsule Take 400 Units by mouth every evening.      No current facility-administered medications on file prior to visit.    Allergies  Allergen Reactions  . Pioglitazone Swelling    Family History  Problem Relation Age of Onset  . Cancer Mother        Colon Cancer  . Colon cancer Mother   . Colon polyps Brother   . Esophageal cancer Neg Hx   . Rectal cancer Neg Hx   . Stomach cancer Neg Hx     BP 140/80   Pulse 87   Ht 5\' 11"  (1.803 m)   Wt 297 lb (134.7 kg)   SpO2 99%   BMI 41.42 kg/m    Review of Systems He denies hypoglycemia    Objective:   Physical Exam VITAL SIGNS:  See vs page GENERAL: no distress Pulses: dorsalis pedis intact bilat.   MSK: no deformity of the feet CV: 1+ left and trace right leg edema.   Skin:   no ulcer on the feet.  normal color and temp on the feet. Neuro: sensation is intact to touch on the feet.   Ext: there is bilateral onychomycosis of the toenails.    Lab Results  Component Value Date   HGBA1C 6.4 (A) 02/27/2020   Lab Results  Component Value Date   CREATININE 2.21 (H) 10/18/2019   BUN 21 10/18/2019   NA 137 10/18/2019   K 4.7 10/18/2019   CL 103 10/18/2019   CO2 26 10/18/2019      Assessment & Plan:  HTN: is noted today Insulin-requiring type 2 DM, with CRI: overcontrolled.   Patient Instructions  Your blood pressure is high today.  Please see your primary care provider soon, to have it rechecked Please reduce the insulin to 22 units with breakfast, and: Please continue the same other diabetes medications.   check your blood sugar twice a day.  vary the time of day when you check, between before the 3 meals, and at bedtime.  also check if you have symptoms of your blood sugar being too high or too low.  please keep a record of the readings and bring it to your next appointment here (or you can bring the meter itself).  You can write it on any piece of paper.  please call us sooner if your blood sugar goes below 70, or if you have a lot of readings over 200.  Please come back for a follow-up appointment in 3 months.

## 2020-02-27 NOTE — Patient Instructions (Addendum)
Your blood pressure is high today.  Please see your primary care provider soon, to have it rechecked Please reduce the insulin to 22 units with breakfast, and: Please continue the same other diabetes medications.   check your blood sugar twice a day.  vary the time of day when you check, between before the 3 meals, and at bedtime.  also check if you have symptoms of your blood sugar being too high or too low.  please keep a record of the readings and bring it to your next appointment here (or you can bring the meter itself).  You can write it on any piece of paper.  please call us sooner if your blood sugar goes below 70, or if you have a lot of readings over 200.  Please come back for a follow-up appointment in 3 months.

## 2020-03-15 ENCOUNTER — Other Ambulatory Visit: Payer: Self-pay | Admitting: Family

## 2020-03-15 DIAGNOSIS — E291 Testicular hypofunction: Secondary | ICD-10-CM

## 2020-03-17 DIAGNOSIS — E1122 Type 2 diabetes mellitus with diabetic chronic kidney disease: Secondary | ICD-10-CM | POA: Diagnosis not present

## 2020-03-17 DIAGNOSIS — N1832 Chronic kidney disease, stage 3b: Secondary | ICD-10-CM | POA: Diagnosis not present

## 2020-03-24 ENCOUNTER — Telehealth: Payer: Self-pay | Admitting: Endocrinology

## 2020-03-24 NOTE — Telephone Encounter (Signed)
Noted. Will work on paperwork as soon as we can.  Thank you!

## 2020-03-24 NOTE — Telephone Encounter (Signed)
Patient came by office to drop off patient assistance ppw for [employer] - placed in Ellison's box.

## 2020-03-27 DIAGNOSIS — Z20822 Contact with and (suspected) exposure to covid-19: Secondary | ICD-10-CM | POA: Diagnosis not present

## 2020-03-28 ENCOUNTER — Encounter: Payer: Medicare Other | Admitting: Family

## 2020-03-28 ENCOUNTER — Encounter: Payer: Self-pay | Admitting: Endocrinology

## 2020-04-01 ENCOUNTER — Other Ambulatory Visit: Payer: Self-pay

## 2020-04-01 ENCOUNTER — Ambulatory Visit (INDEPENDENT_AMBULATORY_CARE_PROVIDER_SITE_OTHER): Payer: Medicare Other | Admitting: Family

## 2020-04-01 ENCOUNTER — Encounter: Payer: Self-pay | Admitting: Family

## 2020-04-01 VITALS — BP 136/74 | HR 94 | Temp 98.3°F | Ht 71.0 in | Wt 291.0 lb

## 2020-04-01 DIAGNOSIS — E785 Hyperlipidemia, unspecified: Secondary | ICD-10-CM | POA: Diagnosis not present

## 2020-04-01 DIAGNOSIS — E291 Testicular hypofunction: Secondary | ICD-10-CM | POA: Diagnosis not present

## 2020-04-01 DIAGNOSIS — Z125 Encounter for screening for malignant neoplasm of prostate: Secondary | ICD-10-CM | POA: Diagnosis not present

## 2020-04-01 DIAGNOSIS — D126 Benign neoplasm of colon, unspecified: Secondary | ICD-10-CM | POA: Diagnosis not present

## 2020-04-01 DIAGNOSIS — Z0000 Encounter for general adult medical examination without abnormal findings: Secondary | ICD-10-CM

## 2020-04-01 LAB — LIPID PANEL
Cholesterol: 189 mg/dL (ref 0–200)
HDL: 38.5 mg/dL — ABNORMAL LOW (ref >39.00)
LDL Cholesterol: 124 mg/dL — ABNORMAL HIGH (ref 0–99)
NonHDL: 150.52
Total CHOL/HDL Ratio: 5
Triglycerides: 135 mg/dL (ref 0.0–149.0)
VLDL: 27 mg/dL (ref 0.0–40.0)

## 2020-04-01 LAB — PSA: PSA: 1.05 ng/mL (ref 0.10–4.00)

## 2020-04-01 MED ORDER — AMLODIPINE BESYLATE 10 MG PO TABS: 10.0000 mg | ORAL_TABLET | Freq: Every day | ORAL | 3 refills | Status: DC

## 2020-04-01 MED ORDER — GABAPENTIN 300 MG PO CAPS: 300.0000 mg | ORAL_CAPSULE | Freq: Three times a day (TID) | ORAL | 3 refills | Status: DC

## 2020-04-01 MED ORDER — METOPROLOL SUCCINATE ER 25 MG PO TB24: ORAL_TABLET | ORAL | 2 refills | Status: DC

## 2020-04-01 MED ORDER — OMEPRAZOLE 20 MG PO CPDR: 20.0000 mg | DELAYED_RELEASE_CAPSULE | Freq: Every day | ORAL | 3 refills | Status: DC

## 2020-04-01 MED ORDER — TRAZODONE HCL 100 MG PO TABS: 100.0000 mg | ORAL_TABLET | Freq: Every day | ORAL | 3 refills | Status: DC

## 2020-04-01 MED ORDER — LISINOPRIL 10 MG PO TABS: 10.0000 mg | ORAL_TABLET | Freq: Every day | ORAL | 3 refills | Status: DC

## 2020-04-01 MED ORDER — ROSUVASTATIN CALCIUM 5 MG PO TABS: 5.0000 mg | ORAL_TABLET | Freq: Every day | ORAL | 3 refills | Status: DC

## 2020-04-01 MED ORDER — SILDENAFIL CITRATE 100 MG PO TABS: 100.0000 mg | ORAL_TABLET | Freq: Every day | ORAL | 3 refills | Status: DC | PRN

## 2020-04-01 NOTE — Progress Notes (Signed)
David Lindsey is a 64 y.o. male with the following history as recorded in EpicCare:  Patient Active Problem List   Diagnosis Date Noted  . CKD (chronic kidney disease) stage 3, GFR 30-59 ml/min (HCC) 05/16/2019  . 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 Degenerative arthritis of left knee 11/18/2017  . Left shoulder pain 08/29/2017  . History of total right knee replacement (TKR) 03/25/2017  . Arthralgia 02/24/2017  . Edema 01/13/2017  . Wellness examination 01/30/2015  . Myalgia and myositis 01/28/2014  . Unspecified constipation 03/13/2013  . Screening for prostate cancer 01/18/2013  . Diabetes mellitus with renal manifestation ([place]) 01/18/2013  . Encounter for long-term (current) use of other medications 03/24/2012  . HYPOKALEMIA 06/06/2008  . Pancytopenia ([place]) 06/06/2008  . Hypogonadism, male 12/22/2007  . HYPERCHOLESTEROLEMIA 12/22/2007  . ALCOHOLISM 12/22/2007  . ESOPHAGITIS, REFLUX 12/22/2007  . Leg pain 12/22/2007  . Essential hypertension 11/10/2006  . Disorder resulting from impaired renal function 11/10/2006    Current Outpatient Medications  Medication Sig Dispense Refill  . Apple Cider Vinegar 600 MG CAPS Take 600 mg by mouth daily.     . Ascorbic Acid (VITAMIN C) 1000 MG tablet Take 1,000 mg by mouth every evening.    . Biotin w/ Vitamins C & E (HAIR SKIN & NAILS GUMMIES PO) Take 1 tablet by mouth every evening.    . Carboxymethylcellul-Glycerin (LUBRICATING EYE DROPS OP) Place 1 drop into both eyes daily as needed (dry eyes).     . cholecalciferol (VITAMIN D3) 25 MCG (1000 UNIT) tablet Take 1,000 Units by mouth daily.    . clotrimazole-betamethasone (LOTRISONE) cream APPLY  CREAM TOPICALLY TO AFFECTED AREA THREE TIMES DAILY AS NEEDED FOR  RASH 45 g 0  . Coenzyme Q10 (COQ-10) 200 MG CAPS Take 200 mg by mouth every evening.     . Cyanocobalamin (B-12) 5000 MCG CAPS Take 5,000 mcg by mouth every  evening.     . dapagliflozin propanediol (FARXIGA) 10 MG TABS tablet Take 1 tablet (10 mg total) by mouth daily. 90 tablet 3  . docusate sodium (COLACE) 100 MG capsule Take 200 mg by mouth every evening.     Marland Kitchen glucose blood test strip Use as instructed check blood sugar two times daily    . Insulin Lispro Prot & Lispro (HUMALOG MIX 75/25 KWIKPEN) (75-25) 100 UNIT/ML Kwikpen Inject 22 Units into the skin daily with breakfast. PATIENT USES LILLY CARES PAP 15 mL 11  . liraglutide (VICTOZA) 18 MG/3ML SOPN Inject 0.3 mLs (1.8 mg total) into the skin every morning. 9 mL 1  . Menthol, Topical Analgesic, 8 % LIQD Apply 1 application topically 3 (three) times daily as needed (for knee pain.).     Marland Kitchen Methylcellulose, Laxative, 500 MG TABS Take 500 mg by mouth daily. Fiberwell Gummies    . MM PEN NEEDLES 31G X 8 MM MISC USE AS DIRECTED WITH  VICTOZA 100 each 0  . Multiple Vitamin (MULTIVITAMIN WITH MINERALS) TABS tablet Take 1 tablet by mouth every evening.     . Omega-3-6-9 CAPS Take 1 capsule by mouth every evening. 1600mg     . SODIUM BICARBONATE PO Take 1 tablet by mouth 2 (two) times daily.     Marland Kitchen trolamine salicylate (ASPERCREME) 10 % cream Apply 1 application topically as needed (knee pain).    . vitamin E 400 UNIT capsule Take 400 Units by mouth every evening.     Marland Kitchen amLODipine (  NORVASC) 10 MG tablet Take 1 tablet (10 mg total) by mouth daily. 90 tablet 3  . gabapentin (NEURONTIN) 300 MG capsule Take 1 capsule (300 mg total) by mouth 3 (three) times daily. 270 capsule 3  . lisinopril (ZESTRIL) 10 MG tablet Take 1 tablet (10 mg total) by mouth daily. 90 tablet 3  . metoprolol succinate (TOPROL-XL) 25 MG 24 hr tablet TAKE 1/2 (ONE-HALF) TABLET BY MOUTH AT BEDTIME 45 tablet 2  . omeprazole (PRILOSEC) 20 MG capsule Take 1 capsule (20 mg total) by mouth daily. 90 capsule 3  . rosuvastatin (CRESTOR) 5 MG tablet Take 1 tablet (5 mg total) by mouth daily. 90 tablet 3  . sildenafil (VIAGRA) 100 MG tablet Take 1  tablet (100 mg total) by mouth daily as needed. 18 tablet 3  . traZODone (DESYREL) 100 MG tablet Take 1 tablet (100 mg total) by mouth at bedtime. 90 tablet 3   No current facility-administered medications for this visit.    Allergies: Pioglitazone  Past Medical History:  Diagnosis Date  . ALCOHOLISM 12/22/2007  . ANEMIA, IRON DEFICIENCY 12/26/2007  . Arthritis    OA  . Blood transfusion without reported diagnosis    as a child  . DIABETES MELLITUS, TYPE II 11/10/2006  . ESOPHAGITIS, REFLUX 12/22/2007  . GERD (gastroesophageal reflux disease)   . HYPERCHOLESTEROLEMIA 12/22/2007  . HYPERTENSION 11/10/2006  . HYPOGONADISM 12/22/2007   Prob. due to ETOH  . HYPOKALEMIA 06/06/2008  . Leukopenia   . Neuromuscular disorder ([place])    neuropathy in feet from Diabetes  . Neuropathy    Chronic Neuropathy BOTH FEET  . Pancytopenia 06/06/2008  . RENAL INSUFFICIENCY 11/10/2006   SEES [place] [facility]ID[facility] STAGE 2 CKD    Past Surgical History:  Procedure Laterality Date  . APPENDECTOMY  AGE 59 OR 10  . COLONOSCOPY  08-21-2004   [facility]- normal  . COLONSCOPY  2017  . ELECTROCARDIOGRAM  11/07/2006  . ESOPHAGOGASTRODUODENOSCOPY  08/20/2004  . EYE SURGERY Bilateral 2014   IOC FOR CATARACTS  . I & D KNEE WITH POLY EXCHANGE Left 05/16/2019   Procedure: LEFT KNEE IRRIGATION AND DEBRIDEMENT WITH POLY EXCHANGE;  Surgeon: Frederik Pear, MD;  Location: [facility];  Service: Orthopedics;  Laterality: Left;  . REPLACEMENT TOTAL KNEE Right 2011  . TOTAL KNEE ARTHROPLASTY Left 02/20/2018   Procedure: LEFT TOTAL KNEE ARTHROPLASTY;  Surgeon: Frederik Pear, MD;  Location: [facility];  Service: Orthopedics;  Laterality: Left;  . UPPER GASTROINTESTINAL ENDOSCOPY      Family History  Problem Relation Age of Onset  . Cancer Mother        Colon Cancer  . Colon cancer Mother   . Colon polyps Brother   . Esophageal cancer Neg Hx   . Rectal cancer Neg Hx   . Stomach cancer Neg Hx     Social History   Tobacco Use  . Smoking  status: Never Smoker  . Smokeless tobacco: Never Used  Substance Use Topics  . Alcohol use: No    Alcohol/week: 0.0 standard drinks    Comment: LAST USE 10 YRS AGO    Subjective:   Presents for yearly CPE; no acute concerns today; enjoying retirement; continuing to see nephrologist and endocrinologist regularly;   Health Maintenance  Topic Date Due  . COVID-19 Vaccine (3 - Booster for Pfizer series) 11/13/2019  . COLONOSCOPY  05/11/2020  . HEMOGLOBIN A1C  08/26/2020  . OPHTHALMOLOGY EXAM  01/27/2021  . FOOT EXAM  02/26/2021  . PNA vac  Low Risk Adult (2 of 2 - PPSV23) 02/24/2022  . TETANUS/TDAP  02/01/2026  . INFLUENZA VACCINE  Completed  . Hepatitis C Screening  Completed   Review of Systems  Constitutional: Negative.   HENT: Negative.   Eyes: Negative.   Respiratory: Negative.   Cardiovascular: Negative.   Gastrointestinal: Negative.   Genitourinary: Negative.   Musculoskeletal: Negative.   Skin: Negative.   Neurological: Negative.   Endo/Heme/Allergies: Negative.   Psychiatric/Behavioral: Negative.       Objective:  Vitals:   04/01/20 1043  BP: 136/74  Pulse: 94  Temp: 98.3 F (36.8 C)  TempSrc: Oral  SpO2: 98%  Weight: 291 lb (132 kg)  Height: 5\' 11"  (1.803 m)    General: Well developed, well nourished, in no acute distress  Skin : Warm and dry.  Head: Normocephalic and atraumatic  Eyes: Sclera and conjunctiva clear; pupils round and reactive to light; extraocular movements intact  Ears: External normal; canals clear; tympanic membranes normal  Oropharynx: Pink, supple. No suspicious lesions  Neck: Supple without thyromegaly, adenopathy  Lungs: Respirations unlabored; clear to auscultation bilaterally without wheeze, rales, rhonchi  CVS exam: normal rate and regular rhythm.  Abdomen: Soft; nontender; nondistended; normoactive bowel sounds; no masses or hepatosplenomegaly  Musculoskeletal: No deformities; no active joint inflammation  Extremities: No  edema, cyanosis, clubbing  Vessels: Symmetric bilaterally  Neurologic: Alert and oriented; speech intact; face symmetrical; moves all extremities well; CNII-XII intact without focal deficit  Assessment:  1. PE (physical exam), annual   2. Hypogonadism, male   3. Adenomatous polyp of colon, unspecified part of colon   4. Hyperlipidemia, unspecified hyperlipidemia type   5. Prostate cancer screening     Plan:  Age appropriate preventive healthcare needs addressed; encouraged regular eye doctor and dental exams; encouraged regular exercise and weight loss; will update labs and refills as needed today; follow-up to be determined;   No follow-ups on file.  Orders Placed This Encounter  Procedures  . PSA    Standing Status:   Future    Number of Occurrences:   1    Standing Expiration Date:   04/01/2021  . Lipid panel    Standing Status:   Future    Number of Occurrences:   1    Standing Expiration Date:   04/01/2021  . Ambulatory referral to Gastroenterology    Referral Priority:   Routine    Referral Type:   Consultation    Referral Reason:   Specialty Services Required    Number of Visits Requested:   1    Requested Prescriptions   Signed Prescriptions Disp Refills  . rosuvastatin (CRESTOR) 5 MG tablet 90 tablet 3    Sig: Take 1 tablet (5 mg total) by mouth daily.  Marland Kitchen amLODipine (NORVASC) 10 MG tablet 90 tablet 3    Sig: Take 1 tablet (10 mg total) by mouth daily.  Marland Kitchen lisinopril (ZESTRIL) 10 MG tablet 90 tablet 3    Sig: Take 1 tablet (10 mg total) by mouth daily.  . metoprolol succinate (TOPROL-XL) 25 MG 24 hr tablet 45 tablet 2    Sig: TAKE 1/2 (ONE-HALF) TABLET BY MOUTH AT BEDTIME  . sildenafil (VIAGRA) 100 MG tablet 18 tablet 3    Sig: Take 1 tablet (100 mg total) by mouth daily as needed.  Marland Kitchen omeprazole (PRILOSEC) 20 MG capsule 90 capsule 3    Sig: Take 1 capsule (20 mg total) by mouth daily.  . traZODone (DESYREL) 100 MG tablet 90  tablet 3    Sig: Take 1 tablet (100 mg  total) by mouth at bedtime.  . gabapentin (NEURONTIN) 300 MG capsule 270 capsule 3    Sig: Take 1 capsule (300 mg total) by mouth 3 (three) times daily.

## 2020-04-02 ENCOUNTER — Telehealth: Payer: Self-pay | Admitting: Endocrinology

## 2020-04-02 NOTE — Telephone Encounter (Signed)
Patient came into office and dropped off [facility] patient assistance paperwork for Dr to complete - placed in [facility]'s box. FYI

## 2020-04-03 NOTE — Telephone Encounter (Signed)
Patient dropped off [employer] paperwork for Humalog. Form completed and faxed to [employer]. Confirmation page with paperwork. MyChart message sent to patient.

## 2020-04-18 DIAGNOSIS — Z0279 Encounter for issue of other medical certificate: Secondary | ICD-10-CM

## 2020-04-22 DIAGNOSIS — D631 Anemia in chronic kidney disease: Secondary | ICD-10-CM | POA: Diagnosis not present

## 2020-04-22 DIAGNOSIS — N1832 Chronic kidney disease, stage 3b: Secondary | ICD-10-CM | POA: Diagnosis not present

## 2020-04-22 DIAGNOSIS — R609 Edema, unspecified: Secondary | ICD-10-CM | POA: Diagnosis not present

## 2020-04-22 DIAGNOSIS — M009 Pyogenic arthritis, unspecified: Secondary | ICD-10-CM | POA: Diagnosis not present

## 2020-04-22 DIAGNOSIS — E872 Acidosis: Secondary | ICD-10-CM | POA: Diagnosis not present

## 2020-04-22 DIAGNOSIS — E871 Hypo-osmolality and hyponatremia: Secondary | ICD-10-CM | POA: Diagnosis not present

## 2020-04-22 DIAGNOSIS — I129 Hypertensive chronic kidney disease with stage 1 through stage 4 chronic kidney disease, or unspecified chronic kidney disease: Secondary | ICD-10-CM | POA: Diagnosis not present

## 2020-04-23 ENCOUNTER — Other Ambulatory Visit: Payer: Self-pay

## 2020-04-23 ENCOUNTER — Encounter: Payer: Self-pay | Admitting: Internal Medicine

## 2020-04-23 ENCOUNTER — Ambulatory Visit (INDEPENDENT_AMBULATORY_CARE_PROVIDER_SITE_OTHER): Payer: Medicare Other | Admitting: Internal Medicine

## 2020-04-23 DIAGNOSIS — T8459XD Infection and inflammatory reaction due to other internal joint prosthesis, subsequent encounter: Secondary | ICD-10-CM

## 2020-04-23 DIAGNOSIS — T8454XD Infection and inflammatory reaction due to internal left knee prosthesis, subsequent encounter: Secondary | ICD-10-CM

## 2020-04-23 DIAGNOSIS — B998 Other infectious disease: Secondary | ICD-10-CM | POA: Diagnosis not present

## 2020-04-23 NOTE — Progress Notes (Signed)
[facility]  Patient Active Problem List   Diagnosis Date Noted  . 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 CKD (chronic kidney disease) stage 3, GFR 30-59 ml/min (HCC) 05/16/2019  . 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 Degenerative arthritis of left knee 11/18/2017  . Left shoulder pain 08/29/2017  . History of total right knee replacement (TKR) 03/25/2017  . Arthralgia 02/24/2017  . Edema 01/13/2017  . Wellness examination 01/30/2015  . Myalgia and myositis 01/28/2014  . Unspecified constipation 03/13/2013  . Screening for prostate cancer 01/18/2013  . Diabetes mellitus with renal manifestation ([place]) 01/18/2013  . Encounter for long-term (current) use of other medications 03/24/2012  . HYPOKALEMIA 06/06/2008  . Pancytopenia ([place]) 06/06/2008  . Hypogonadism, male 12/22/2007  . HYPERCHOLESTEROLEMIA 12/22/2007  . ALCOHOLISM 12/22/2007  . ESOPHAGITIS, REFLUX 12/22/2007  . Leg pain 12/22/2007  . Essential hypertension 11/10/2006  . Disorder resulting from impaired renal function 11/10/2006    Patient's Medications  New Prescriptions   No medications on file  Previous Medications   AMLODIPINE (NORVASC) 10 MG TABLET    Take 1 tablet (10 mg total) by mouth daily.   APPLE CIDER VINEGAR 600 MG CAPS    Take 600 mg by mouth daily.    ASCORBIC ACID (VITAMIN C) 1000 MG TABLET    Take 1,000 mg by mouth every evening.   BIOTIN W/ VITAMINS C & E (HAIR SKIN & NAILS GUMMIES PO)    Take 1 tablet by mouth every evening.   CARBOXYMETHYLCELLUL-GLYCERIN (LUBRICATING EYE DROPS OP)    Place 1 drop into both eyes daily as needed (dry eyes).    CHOLECALCIFEROL (VITAMIN D3) 25 MCG (1000 UNIT) TABLET    Take 1,000 Units by mouth daily.   CLOTRIMAZOLE-BETAMETHASONE (LOTRISONE) CREAM    APPLY  CREAM TOPICALLY TO AFFECTED AREA THREE TIMES DAILY AS NEEDED FOR  RASH   COENZYME Q10 (COQ-10) 200 MG CAPS    Take 200 mg by mouth  every evening.    CYANOCOBALAMIN (B-12) 5000 MCG CAPS    Take 5,000 mcg by mouth every evening.    DAPAGLIFLOZIN PROPANEDIOL (FARXIGA) 10 MG TABS TABLET    Take 1 tablet (10 mg total) by mouth daily.   DOCUSATE SODIUM (COLACE) 100 MG CAPSULE    Take 200 mg by mouth every evening.    GABAPENTIN (NEURONTIN) 300 MG CAPSULE    Take 1 capsule (300 mg total) by mouth 3 (three) times daily.   GLUCOSE BLOOD TEST STRIP    Use as instructed check blood sugar two times daily   INSULIN LISPRO PROT & LISPRO (HUMALOG MIX 75/25 KWIKPEN) (75-25) 100 UNIT/ML KWIKPEN    Inject 22 Units into the skin daily with breakfast. PATIENT USES LILLY CARES PAP   LIRAGLUTIDE (VICTOZA) 18 MG/3ML SOPN    Inject 0.3 mLs (1.8 mg total) into the skin every morning.   LISINOPRIL (ZESTRIL) 10 MG TABLET    Take 1 tablet (10 mg total) by mouth daily.   MENTHOL, TOPICAL ANALGESIC, 8 % LIQD    Apply 1 application topically 3 (three) times daily as needed (for knee pain.).    METHYLCELLULOSE, LAXATIVE, 500 MG TABS    Take 500 mg by mouth daily. [employer]well Gummies   METOPROLOL SUCCINATE (TOPROL-XL) 25 MG 24 HR TABLET    TAKE 1/2 (ONE-HALF) TABLET BY MOUTH AT BEDTIME   MM PEN NEEDLES 31G X 8  MM MISC    USE AS DIRECTED WITH  VICTOZA   MULTIPLE VITAMIN (MULTIVITAMIN WITH MINERALS) TABS TABLET    Take 1 tablet by mouth every evening.    OMEGA-3-6-9 CAPS    Take 1 capsule by mouth every evening. 1600mg    OMEPRAZOLE (PRILOSEC) 20 MG CAPSULE    Take 1 capsule (20 mg total) by mouth daily.   ROSUVASTATIN (CRESTOR) 5 MG TABLET    Take 1 tablet (5 mg total) by mouth daily.   SILDENAFIL (VIAGRA) 100 MG TABLET    Take 1 tablet (100 mg total) by mouth daily as needed.   SODIUM BICARBONATE PO    Take 1 tablet by mouth 2 (two) times daily.    TRAZODONE (DESYREL) 100 MG TABLET    Take 1 tablet (100 mg total) by mouth at bedtime.   TROLAMINE SALICYLATE (ASPERCREME) 10 % CREAM    Apply 1 application topically as needed (knee pain).   VITAMIN E 400 UNIT  CAPSULE    Take 400 Units by mouth every evening.   Modified Medications   No medications on file  Discontinued Medications   No medications on file    Subjective: David Lindsey is in for his routine follow-up visit.  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  He says he has a little bit of numbness along his left lateral calf but otherwise is feeling well.  He continues to feel better with minimal pain in his left knee.  He only occasionally has to take some acetaminophen.  Review of Systems: Review of Systems  Constitutional: Negative for chills, diaphoresis and fever.  Gastrointestinal: Negative for abdominal pain, diarrhea, nausea and vomiting.  Musculoskeletal: Positive for joint pain.    Past Medical History:  Diagnosis Date  . ALCOHOLISM 12/22/2007  . ANEMIA, IRON DEFICIENCY 12/26/2007  . Arthritis    OA  . Blood transfusion without reported diagnosis    as a child  . DIABETES MELLITUS, TYPE II 11/10/2006  . ESOPHAGITIS, REFLUX 12/22/2007  . GERD (gastroesophageal reflux disease)   . HYPERCHOLESTEROLEMIA 12/22/2007  . HYPERTENSION 11/10/2006  . HYPOGONADISM 12/22/2007   Prob. due to ETOH  . HYPOKALEMIA 06/06/2008  . Leukopenia   . Neuromuscular disorder ([place])    neuropathy in feet from Diabetes  . Neuropathy    Chronic Neuropathy BOTH FEET  . Pancytopenia 06/06/2008  . RENAL INSUFFICIENCY 11/10/2006   SEES [place] [facility] STAGE 2 CKD    Social History   Tobacco Use  . Smoking status: Never Smoker  . Smokeless tobacco: Never Used  Vaping Use  . Vaping Use: Never used  Substance Use Topics  . Alcohol use: No    Alcohol/week: 0.0 standard drinks    Comment: LAST USE 10 YRS AGO  . Drug use: No    Family History  Problem Relation Age of Onset  . Cancer Mother        Colon Cancer  . Colon cancer Mother   . Colon polyps Brother   . Esophageal cancer Neg Hx   . Rectal cancer Neg Hx   . Stomach cancer Neg  Hx     Allergies  Allergen Reactions  . Pioglitazone Swelling    Objective: Vitals:   04/23/20 1007  BP: 124/83  Pulse: 82  Resp: 16  Temp: (!) 97.3 F (36.3 C)  TempSrc: Oral  SpO2: 99%  Weight: 297 lb (134.7 kg)  Height: 5\' 11"  (1.803 m)  Body mass index is 41.42 kg/m.  Physical Exam Constitutional:      Comments: He is in good spirits.  Musculoskeletal:        General: No swelling or tenderness.     Comments: His left knee incision has healed nicely without any evidence of ongoing inflammation.  He has good range of motion.       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  He will continue off of antibiotics and follow-up here as needed.          Michel Bickers, MD [facility] for [facility] [phone] pager   [phone] cell 04/23/2020, 10:21 AM

## 2020-04-23 NOTE — Assessment & Plan Note (Signed)
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  He will continue off of antibiotics and follow-up here as needed.

## 2020-04-29 ENCOUNTER — Encounter: Payer: Self-pay | Admitting: Endocrinology

## 2020-05-01 ENCOUNTER — Encounter: Payer: Self-pay | Admitting: Internal Medicine

## 2020-05-20 DIAGNOSIS — Z0279 Encounter for issue of other medical certificate: Secondary | ICD-10-CM

## 2020-05-29 ENCOUNTER — Ambulatory Visit: Payer: Medicare Other | Admitting: Endocrinology

## 2020-06-10 ENCOUNTER — Telehealth: Payer: Self-pay

## 2020-06-10 NOTE — Telephone Encounter (Signed)
LVM and I also sent a MyChart message to let the pt know that the office has received this Victoza and is ready for pick up.

## 2020-06-11 ENCOUNTER — Ambulatory Visit (AMBULATORY_SURGERY_CENTER): Payer: Self-pay | Admitting: *Deleted

## 2020-06-11 ENCOUNTER — Other Ambulatory Visit: Payer: Self-pay

## 2020-06-11 VITALS — Ht 71.0 in | Wt 297.0 lb

## 2020-06-11 DIAGNOSIS — Z800 Family history of malignant neoplasm of digestive organs: Secondary | ICD-10-CM

## 2020-06-11 MED ORDER — SUTAB 1479-225-188 MG PO TABS: 1.0000 | ORAL_TABLET | ORAL | 0 refills | Status: DC

## 2020-06-11 NOTE — Progress Notes (Signed)
Patient is here in-person for PV. Patient denies any allergies to eggs or soy. Patient denies any problems with anesthesia/sedation. Patient denies any oxygen use at home. Patient denies taking any diet/weight loss medications or blood thinners. Patient is not being treated for MRSA or C-diff. Patient is aware of our care-partner policy and Covid-19 safety protocol. EMMI education assigned to the patient for the procedure, sent to MyChart.   Patient is fully COVID-19 vaccinated, per patient.   Prep Prescription coupon given to the patient. 

## 2020-06-13 ENCOUNTER — Encounter: Payer: Self-pay | Admitting: Internal Medicine

## 2020-06-25 ENCOUNTER — Encounter: Payer: Self-pay | Admitting: Internal Medicine

## 2020-06-25 ENCOUNTER — Other Ambulatory Visit: Payer: Self-pay

## 2020-06-25 ENCOUNTER — Ambulatory Visit (AMBULATORY_SURGERY_CENTER): Payer: Medicare Other | Admitting: Internal Medicine

## 2020-06-25 VITALS — BP 113/65 | HR 74 | Temp 97.3°F | Resp 10 | Ht 71.0 in | Wt 297.0 lb

## 2020-06-25 DIAGNOSIS — Z1211 Encounter for screening for malignant neoplasm of colon: Secondary | ICD-10-CM | POA: Diagnosis not present

## 2020-06-25 DIAGNOSIS — Z800 Family history of malignant neoplasm of digestive organs: Secondary | ICD-10-CM | POA: Diagnosis not present

## 2020-06-25 DIAGNOSIS — N183 Chronic kidney disease, stage 3 unspecified: Secondary | ICD-10-CM | POA: Diagnosis not present

## 2020-06-25 DIAGNOSIS — I129 Hypertensive chronic kidney disease with stage 1 through stage 4 chronic kidney disease, or unspecified chronic kidney disease: Secondary | ICD-10-CM | POA: Diagnosis not present

## 2020-06-25 DIAGNOSIS — D123 Benign neoplasm of transverse colon: Secondary | ICD-10-CM

## 2020-06-25 MED ORDER — SODIUM CHLORIDE 0.9 % IV SOLN: 500.0000 mL | Freq: Once | INTRAVENOUS | Status: DC

## 2020-06-25 NOTE — Progress Notes (Signed)
Called to room to assist during endoscopic procedure.  Patient ID and intended procedure confirmed with present staff. Received instructions for my participation in the procedure from the performing physician.  

## 2020-06-25 NOTE — Progress Notes (Signed)
VS by CW  Pt's states no medical or surgical changes since previsit or office visit.  

## 2020-06-25 NOTE — Op Note (Addendum)
[facility] Patient Name: David Lindsey Procedure Date: 06/25/2020 8:41 AM MRN: [id number] Endoscopist: Docia Chuck. Henrene Pastor , MD Age: 65 Referring MD:  Date of Birth: 1956/01/11 Gender: Male Account #: [id number] Procedure:                Colonoscopy with cold snare polypectomy x 1 Indications:              Screening in patient at increased risk: Colorectal                            cancer in mother 51's. Previous examinations 2000,                            2006, 2017 all negative for neoplasia Medicines:                Monitored Anesthesia Care Procedure:                Pre-Anesthesia Assessment:                           - Prior to the procedure, a History and Physical                            was performed, and patient medications and                            allergies were reviewed. The patient's tolerance of                            previous anesthesia was also reviewed. The risks                            and benefits of the procedure and the sedation                            options and risks were discussed with the patient.                            All questions were answered, and informed consent                            was obtained. Prior Anticoagulants: The patient has                            taken no previous anticoagulant or antiplatelet                            agents. ASA Grade Assessment: II - A patient with                            mild systemic disease. After reviewing the risks                            and benefits, the patient was deemed in  satisfactory condition to undergo the procedure.                           After obtaining informed consent, the colonoscope                            was passed under direct vision. Throughout the                            procedure, the patient's blood pressure, pulse, and                            oxygen saturations were monitored continuously. The                             Olympus [id number]) [id number] was introduced                            through the anus and advanced to the the cecum,                            identified by appendiceal orifice and ileocecal                            valve. The ileocecal valve and the rectum were                            photographed. The quality of the bowel preparation                            was excellent. The colonoscopy was performed with                            difficulty. The patient tolerated the procedure                            well. The bowel preparation used was SUPREP via                            split dose instruction. The Olympus [id number]                            [id number]) Colonoscope was introduced through the                            and advanced to the. Scope In: 8:57:27 AM Scope Out: 9:35:24 AM Scope Withdrawal Time: 0 hours 3 minutes 51 seconds  Total Procedure Duration: 0 hours 37 minutes 57 seconds  Findings:                 A 5 mm polyp was found in the proximal transverse                            colon. The polyp was removed with a cold  snare.                            Resection and retrieval were complete.                           The examination was quite difficult due to                            redundant colon and large body habitus. 2 flexible                            adult colonoscope were used in tandem. The entire                            colon was well visualized except for the cecal                            base. This was however well visualized, and photo                            documented, on the previous exam. The exam was                            otherwise without abnormality on direct and                            retroflexion views. Complications:            No immediate complications. Estimated blood loss:                            None. Estimated Blood Loss:     Estimated blood loss: none. Impression:               - One 5  mm polyp in the proximal transverse colon,                            removed with a cold snare. Resected and retrieved.                           - The examination was otherwise normal on direct                            and retroflexion views.                           - REDUNDANT colon as described above Recommendation:           - Repeat colonoscopy in 5 years for surveillance.                           - Patient has a contact number available for                            emergencies. The signs  and symptoms of potential                            delayed complications were discussed with the                            patient. Return to normal activities tomorrow.                            Written discharge instructions were provided to the                            patient.                           - Resume previous diet.                           - Continue present medications.                           - Await pathology results. Docia Chuck. Henrene Pastor, MD 06/25/2020 9:45:00 AM This report has been signed electronically.

## 2020-06-25 NOTE — Progress Notes (Signed)
Report to PACU, RN, vss, BBS= Clear.  

## 2020-06-25 NOTE — Patient Instructions (Signed)
Discharge instructions given. Handout on polyps. Resume previous medications. YOU HAD AN ENDOSCOPIC PROCEDURE TODAY AT THE [facility]:   Refer to the procedure report that was given to you for any specific questions about what was found during the examination.  If the procedure report does not answer your questions, please call your gastroenterologist to clarify.  If you requested that your care partner not be given the details of your procedure findings, then the procedure report has been included in a sealed envelope for you to review at your convenience later.  YOU SHOULD EXPECT: Some feelings of bloating in the abdomen. Passage of more gas than usual.  Walking can help get rid of the air that was put into your GI tract during the procedure and reduce the bloating. If you had a lower endoscopy (such as a colonoscopy or flexible sigmoidoscopy) you may notice spotting of blood in your stool or on the toilet paper. If you underwent a bowel prep for your procedure, you may not have a normal bowel movement for a few days.  Please Note:  You might notice some irritation and congestion in your nose or some drainage.  This is from the oxygen used during your procedure.  There is no need for concern and it should clear up in a day or so.  SYMPTOMS TO REPORT IMMEDIATELY:  Following lower endoscopy (colonoscopy or flexible sigmoidoscopy):  Excessive amounts of blood in the stool  Significant tenderness or worsening of abdominal pains  Swelling of the abdomen that is new, acute  Fever of 100F or higher   For urgent or emergent issues, a gastroenterologist can be reached at any hour by calling [phone]. Do not use MyChart messaging for urgent concerns.    DIET:  We do recommend a small meal at first, but then you may proceed to your regular diet.  Drink plenty of fluids but you should avoid alcoholic beverages for 24 hours.  ACTIVITY:  You should plan to take it easy for the rest  of today and you should NOT DRIVE or use heavy machinery until tomorrow (because of the sedation medicines used during the test).    FOLLOW UP: Our staff will call the number listed on your records 48-72 hours following your procedure to check on you and address any questions or concerns that you may have regarding the information given to you following your procedure. If we do not reach you, we will leave a message.  We will attempt to reach you two times.  During this call, we will ask if you have developed any symptoms of COVID 19. If you develop any symptoms (ie: fever, flu-like symptoms, shortness of breath, cough etc.) before then, please call [phone].  If you test positive for Covid 19 in the 2 weeks post procedure, please call and report this information to us.    If any biopsies were taken you will be contacted by phone or by letter within the next 1-3 weeks.  Please call us at [phone] if you have not heard about the biopsies in 3 weeks.    SIGNATURES/CONFIDENTIALITY: You and/or your care partner have signed paperwork which will be entered into your electronic medical record.  These signatures attest to the fact that that the information above on your After Visit Summary has been reviewed and is understood.  Full responsibility of the confidentiality of this discharge information lies with you and/or your care-partner.  

## 2020-06-27 ENCOUNTER — Telehealth: Payer: Self-pay

## 2020-06-27 NOTE — Telephone Encounter (Signed)
LVM

## 2020-07-01 ENCOUNTER — Encounter: Payer: Self-pay | Admitting: Internal Medicine

## 2020-07-01 ENCOUNTER — Ambulatory Visit (INDEPENDENT_AMBULATORY_CARE_PROVIDER_SITE_OTHER): Payer: Medicare Other | Admitting: Endocrinology

## 2020-07-01 ENCOUNTER — Other Ambulatory Visit: Payer: Self-pay

## 2020-07-01 VITALS — BP 136/80 | HR 89 | Ht 71.0 in | Wt 298.8 lb

## 2020-07-01 DIAGNOSIS — N181 Chronic kidney disease, stage 1: Secondary | ICD-10-CM | POA: Diagnosis not present

## 2020-07-01 DIAGNOSIS — E1122 Type 2 diabetes mellitus with diabetic chronic kidney disease: Secondary | ICD-10-CM | POA: Diagnosis not present

## 2020-07-01 LAB — POCT GLYCOSYLATED HEMOGLOBIN (HGB A1C): Hemoglobin A1C: 8.8 % — AB (ref 4.0–5.6)

## 2020-07-01 NOTE — Patient Instructions (Addendum)
Your blood pressure is high today.  Please see your primary care provider soon, to have it rechecked Please continue the same 3 diabetes medications.   check your blood sugar twice a day.  vary the time of day when you check, between before the 3 meals, and at bedtime.  also check if you have symptoms of your blood sugar being too high or too low.  please keep a record of the readings and bring it to your next appointment here (or you can bring the meter itself).  You can write it on any piece of paper.  please call us sooner if your blood sugar goes below 70, or if you have a lot of readings over 200.  Please come back for a follow-up appointment in 3 months.

## 2020-07-01 NOTE — Progress Notes (Signed)
Subjective:    Patient ID: David Lindsey, male    DOB: 09-25-1955, 65 y.o.   MRN: [id number]  HPI Pt returns for f/u of diabetes mellitus: DM type: Insulin-requiring type 2.   Dx'ed: 7564 Complications: stage 4 CRI, and PN Therapy: insulin since 2019, Victoza, and [place].   DKA: never.   Severe hypoglycemia: never.   Pancreatitis: never.   SDOH: he gets meds from [facility]--pt assistance.   Other: he did not tolerate parlodel (tremor); he cannot take pioglitizone (edema); he takes qd insulin, after poor results with multiple daily injections; he is retired.   Interval history: no cbg record, but states cbg's vary from 85-180.  It is in general higher as the day goes on.  pt states he feels well in general.  Pt says he never misses the insulin.  He missed Victoza all of 1/22, due to difficulty obtaining it from pt assist.   Past Medical History:  Diagnosis Date  . ALCOHOLISM 12/22/2007  . ANEMIA, IRON DEFICIENCY 12/26/2007  . Arthritis    OA  . Blood transfusion without reported diagnosis    as a child  . DIABETES MELLITUS, TYPE II 11/10/2006  . ESOPHAGITIS, REFLUX 12/22/2007  . GERD (gastroesophageal reflux disease)   . HYPERCHOLESTEROLEMIA 12/22/2007  . HYPERTENSION 11/10/2006  . HYPOGONADISM 12/22/2007   Prob. due to ETOH  . HYPOKALEMIA 06/06/2008  . Leukopenia   . Neuromuscular disorder ([place])    neuropathy in feet from Diabetes  . Neuropathy    Chronic Neuropathy BOTH FEET  . Pancytopenia 06/06/2008  . RENAL INSUFFICIENCY 11/10/2006   SEES [place] [facility] STAGE 2 CKD    Past Surgical History:  Procedure Laterality Date  . APPENDECTOMY  AGE 89 OR 10  . COLONOSCOPY  08-21-2004/2017   [facility]- normal/2017 [facility]  . COLONSCOPY  2017  . ELECTROCARDIOGRAM  11/07/2006  . ESOPHAGOGASTRODUODENOSCOPY  08/20/2004  . EYE SURGERY Bilateral 2014   [facility] FOR CATARACTS  . I & D KNEE WITH POLY EXCHANGE Left 05/16/2019   Procedure: LEFT KNEE IRRIGATION AND DEBRIDEMENT WITH POLY EXCHANGE;  Surgeon:  Frederik Pear, MD;  Location: [facility];  Service: Orthopedics;  Laterality: Left;  . REPLACEMENT TOTAL KNEE Right 2011  . TOTAL KNEE ARTHROPLASTY Left 02/20/2018   Procedure: LEFT TOTAL KNEE ARTHROPLASTY;  Surgeon: Frederik Pear, MD;  Location: [facility];  Service: Orthopedics;  Laterality: Left;  . UPPER GASTROINTESTINAL ENDOSCOPY      Social History   Socioeconomic History  . Marital status: Married    Spouse name: Not on file  . Number of children: Not on file  . Years of education: Not on file  . Highest education level: Not on file  Occupational History  . 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 Smoking status: Never Smoker  . Smokeless tobacco: Never Used  Vaping Use  . Vaping Use: Never used  Substance and Sexual Activity  . Alcohol use: No    Alcohol/week: 0.0 standard drinks    Comment: LAST USE 10 YRS AGO  . Drug use: No  . Sexual activity: Not on file  Other Topics Concern  . Not on file  Social History Narrative  . Not on file   Social Determinants of Health   Financial Resource Strain: Not on file  Food Insecurity: Not on file  Transportation Needs: Not on file  Physical Activity: Not on file  Stress: Not on file  Social Connections: Not on file  Intimate  Partner Violence: Not on file    Current Outpatient Medications on File Prior to Visit  Medication Sig Dispense Refill  . amLODipine (NORVASC) 10 MG tablet Take 1 tablet (10 mg total) by mouth daily. 90 tablet 3  . Apple Cider Vinegar 600 MG CAPS Take 600 mg by mouth daily.     . Ascorbic Acid (VITAMIN C) 1000 MG tablet Take 1,000 mg by mouth every evening.    . Biotin w/ Vitamins C & E (HAIR SKIN & NAILS GUMMIES PO) Take 1 tablet by mouth every evening.    . Carboxymethylcellul-Glycerin (LUBRICATING EYE DROPS OP) Place 1 drop into both eyes daily as needed (dry eyes).     . cholecalciferol (VITAMIN D3) 25 MCG (1000 UNIT) tablet Take 1,000 Units by mouth daily.    .  clotrimazole-betamethasone (LOTRISONE) cream APPLY  CREAM TOPICALLY TO AFFECTED AREA THREE TIMES DAILY AS NEEDED FOR  RASH 45 g 0  . Coenzyme Q10 (COQ-10) 200 MG CAPS Take 200 mg by mouth every evening.     . Cyanocobalamin (B-12) 5000 MCG CAPS Take 5,000 mcg by mouth every evening.     . dapagliflozin propanediol (FARXIGA) 10 MG TABS tablet Take 1 tablet (10 mg total) by mouth daily. 90 tablet 3  . docusate sodium (COLACE) 100 MG capsule Take 200 mg by mouth every evening.     . furosemide (LASIX) 20 MG tablet Take 20 mg by mouth.    . gabapentin (NEURONTIN) 300 MG capsule Take 1 capsule (300 mg total) by mouth 3 (three) times daily. 270 capsule 3  . glucose blood test strip Use as instructed check blood sugar two times daily    . Insulin Lispro Prot & Lispro (HUMALOG MIX 75/25 KWIKPEN) (75-25) 100 UNIT/ML Kwikpen Inject 22 Units into the skin daily with breakfast. PATIENT USES LILLY CARES PAP 15 mL 11  . liraglutide (VICTOZA) 18 MG/3ML SOPN Inject 0.3 mLs (1.8 mg total) into the skin every morning. 9 mL 1  . lisinopril (ZESTRIL) 10 MG tablet Take 1 tablet (10 mg total) by mouth daily. 90 tablet 3  . Menthol, Topical Analgesic, 8 % LIQD Apply 1 application topically 3 (three) times daily as needed (for knee pain.).     Marland Kitchen Methylcellulose, Laxative, 500 MG TABS Take 500 mg by mouth daily. Fiberwell Gummies    . metoprolol succinate (TOPROL-XL) 25 MG 24 hr tablet TAKE 1/2 (ONE-HALF) TABLET BY MOUTH AT BEDTIME 45 tablet 2  . MM PEN NEEDLES 31G X 8 MM MISC USE AS DIRECTED WITH  VICTOZA 100 each 0  . Multiple Vitamin (MULTIVITAMIN WITH MINERALS) TABS tablet Take 1 tablet by mouth every evening.     . Omega-3-6-9 CAPS Take 1 capsule by mouth every evening. 1600mg     . omeprazole (PRILOSEC) 20 MG capsule Take 1 capsule (20 mg total) by mouth daily. 90 capsule 3  . rosuvastatin (CRESTOR) 5 MG tablet Take 1 tablet (5 mg total) by mouth daily. 90 tablet 3  . sildenafil (VIAGRA) 100 MG tablet Take 1 tablet  (100 mg total) by mouth daily as needed. 18 tablet 3  . SODIUM BICARBONATE PO Take 1 tablet by mouth 2 (two) times daily.     . traZODone (DESYREL) 100 MG tablet Take 1 tablet (100 mg total) by mouth at bedtime. 90 tablet 3  . trolamine salicylate (ASPERCREME) 10 % cream Apply 1 application topically as needed (knee pain).    . vitamin E 400 UNIT capsule Take 400 Units by  mouth every evening.      No current facility-administered medications on file prior to visit.    Allergies  Allergen Reactions  . Pioglitazone Swelling    Family History  Problem Relation Age of Onset  . Cancer Mother        Colon Cancer  . Colon cancer Mother 73  . Colon polyps Brother   . Esophageal cancer Neg Hx   . Rectal cancer Neg Hx   . Stomach cancer Neg Hx     BP 136/80 (BP Location: Right Arm, Patient Position: Sitting, Cuff Size: Large)   Pulse 89   Ht 5\' 11"  (1.803 m)   Wt 298 lb 12.8 oz (135.5 kg)   SpO2 96%   BMI 41.67 kg/m    Review of Systems He denies hypoglycemia.      Objective:   Physical Exam VITAL SIGNS:  See vs page GENERAL: no distress Pulses: dorsalis pedis intact bilat.   MSK: no deformity of the feet CV: 1+ left and trace right leg edema.   Skin:  no ulcer on the feet.  normal color and temp on the feet. Neuro: sensation is intact to touch on the feet.   Ext: there is bilateral onychomycosis of the toenails.    A1c=8.8%    Assessment & Plan:  HTN: is noted today Insulin-requiring type 2 DM, with stage 4 CRI: uncontrolled, due to difficulty obtaining meds.    Patient Instructions  Your blood pressure is high today.  Please see your primary care provider soon, to have it rechecked Please continue the same 3 diabetes medications.   check your blood sugar twice a day.  vary the time of day when you check, between before the 3 meals, and at bedtime.  also check if you have symptoms of your blood sugar being too high or too low.  please keep a record of the readings  and bring it to your next appointment here (or you can bring the meter itself).  You can write it on any piece of paper.  please call us sooner if your blood sugar goes below 70, or if you have a lot of readings over 200.  Please come back for a follow-up appointment in 3 months.

## 2020-08-25 ENCOUNTER — Other Ambulatory Visit: Payer: Self-pay | Admitting: Endocrinology

## 2020-09-26 ENCOUNTER — Telehealth: Payer: Self-pay

## 2020-09-26 NOTE — Telephone Encounter (Signed)
Called and advised pt's Victoza (4 boxes) has been delivered to the office and is ready for pick up.

## 2020-10-07 ENCOUNTER — Other Ambulatory Visit: Payer: Self-pay

## 2020-10-07 ENCOUNTER — Ambulatory Visit (INDEPENDENT_AMBULATORY_CARE_PROVIDER_SITE_OTHER): Payer: Medicare Other | Admitting: Endocrinology

## 2020-10-07 DIAGNOSIS — N181 Chronic kidney disease, stage 1: Secondary | ICD-10-CM

## 2020-10-07 DIAGNOSIS — E1122 Type 2 diabetes mellitus with diabetic chronic kidney disease: Secondary | ICD-10-CM | POA: Diagnosis not present

## 2020-10-07 LAB — POCT GLYCOSYLATED HEMOGLOBIN (HGB A1C): Hemoglobin A1C: 7.8 % — AB (ref 4.0–5.6)

## 2020-10-07 MED ORDER — INSULIN LISPRO PROT & LISPRO (75-25 MIX) 100 UNIT/ML KWIKPEN: 23.0000 [IU] | PEN_INJECTOR | Freq: Every day | SUBCUTANEOUS | 11 refills | Status: DC

## 2020-10-07 NOTE — Patient Instructions (Addendum)
Please increase the insulin to 23 units with breakfast, and continue the same other 2 diabetes medications.   check your blood sugar twice a day.  vary the time of day when you check, between before the 3 meals, and at bedtime.  also check if you have symptoms of your blood sugar being too high or too low.  please keep a record of the readings and bring it to your next appointment here (or you can bring the meter itself).  You can write it on any piece of paper.  please call us sooner if your blood sugar goes below 70, or if you have a lot of readings over 200.   Please come back for a follow-up appointment in 4 months.

## 2020-10-07 NOTE — Progress Notes (Signed)
Subjective:    Patient ID: David Lindsey, male    DOB: April 07, 1956, 65 y.o.   MRN: [id number]  HPI Pt returns for f/u of diabetes mellitus: DM type: Insulin-requiring type 2.   Dx'ed: 0737 Complications: stage 4 CRI, and PN Therapy: insulin since 2019, Victoza, and [place].   DKA: never.   Severe hypoglycemia: never.   Pancreatitis: never.   SDOH: he gets meds from [facility]--pt assistance.   Other: he did not tolerate parlodel (tremor); he cannot take pioglitizone (edema); he takes qd insulin, after poor results with multiple daily injections; he is retired.   Interval history: no cbg record, but states cbg's vary from 76-190.  It is in general higher as the day goes on.  pt states he feels well in general.  Pt says he never misses the insulin.  He seldom has hypoglycemia, and these episodes are mild.  This happens when a meal is missed or delayed.  Past Medical History:  Diagnosis Date   ALCOHOLISM 12/22/2007   ANEMIA, IRON DEFICIENCY 12/26/2007   Arthritis    OA   Blood transfusion without reported diagnosis    as a child   DIABETES MELLITUS, TYPE II 11/10/2006   ESOPHAGITIS, REFLUX 12/22/2007   GERD (gastroesophageal reflux disease)    HYPERCHOLESTEROLEMIA 12/22/2007   HYPERTENSION 11/10/2006   HYPOGONADISM 12/22/2007   Prob. due to ETOH   HYPOKALEMIA 06/06/2008   Leukopenia    Neuromuscular disorder ([facility])    neuropathy in feet from Diabetes   Neuropathy    Chronic Neuropathy BOTH FEET   Pancytopenia 06/06/2008   RENAL INSUFFICIENCY 11/10/2006   SEES [place] [facility] STAGE 2 CKD    Past Surgical History:  Procedure Laterality Date   APPENDECTOMY  AGE 22 OR 10   COLONOSCOPY  08-21-2004/2017   [facility]- normal/2017 [place]   COLONSCOPY  2017   ELECTROCARDIOGRAM  11/07/2006   ESOPHAGOGASTRODUODENOSCOPY  08/20/2004   EYE SURGERY Bilateral 2014   [facility] FOR CATARACTS   I & D KNEE WITH POLY EXCHANGE Left 05/16/2019   Procedure: LEFT KNEE IRRIGATION AND DEBRIDEMENT WITH POLY EXCHANGE;   Surgeon: Frederik Pear, MD;  Location: [facility];  Service: Orthopedics;  Laterality: Left;   REPLACEMENT TOTAL KNEE Right 2011   TOTAL KNEE ARTHROPLASTY Left 02/20/2018   Procedure: LEFT TOTAL KNEE ARTHROPLASTY;  Surgeon: Frederik Pear, MD;  Location: [facility];  Service: Orthopedics;  Laterality: Left;   UPPER GASTROINTESTINAL ENDOSCOPY      Social History   Socioeconomic History   Marital status: Married    Spouse name: Not on file   Number of children: Not on file   Years of education: Not on file   Highest education level: Not on file  Occupational History   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  Tobacco Use   Smoking status: Never   Smokeless tobacco: Never  Vaping Use   Vaping Use: Never used  Substance and Sexual Activity   Alcohol use: No    Alcohol/week: 0.0 standard drinks    Comment: LAST USE 10 YRS AGO   Drug use: No   Sexual activity: Not on file  Other Topics Concern   Not on file  Social History Narrative   Not on file   Social Determinants of Health   Financial Resource Strain: Not on file  Food Insecurity: Not on file  Transportation Needs: Not on file  Physical Activity: Not on file  Stress: Not on file  Social Connections: Not on file  Intimate Partner Violence: Not on file    Current Outpatient Medications on File Prior to Visit  Medication Sig Dispense Refill   amLODipine (NORVASC) 10 MG tablet Take 1 tablet (10 mg total) by mouth daily. 90 tablet 3   Apple Cider Vinegar 600 MG CAPS Take 600 mg by mouth daily.      Ascorbic Acid (VITAMIN C) 1000 MG tablet Take 1,000 mg by mouth every evening.     Biotin w/ Vitamins C & E (HAIR SKIN & NAILS GUMMIES PO) Take 1 tablet by mouth every evening.     Carboxymethylcellul-Glycerin (LUBRICATING EYE DROPS OP) Place 1 drop into both eyes daily as needed (dry eyes).      cholecalciferol (VITAMIN D3) 25 MCG (1000 UNIT) tablet Take 1,000 Units by mouth daily.     clotrimazole-betamethasone (LOTRISONE)  cream APPLY  CREAM TOPICALLY THREE TIMES DAILY AS NEEDED FOR  RASH 45 g 0   Coenzyme Q10 (COQ-10) 200 MG CAPS Take 200 mg by mouth every evening.      Cyanocobalamin (B-12) 5000 MCG CAPS Take 5,000 mcg by mouth every evening.      dapagliflozin propanediol (FARXIGA) 10 MG TABS tablet Take 1 tablet (10 mg total) by mouth daily. 90 tablet 3   docusate sodium (COLACE) 100 MG capsule Take 200 mg by mouth every evening.      furosemide (LASIX) 20 MG tablet Take 20 mg by mouth.     gabapentin (NEURONTIN) 300 MG capsule Take 1 capsule (300 mg total) by mouth 3 (three) times daily. 270 capsule 3   glucose blood test strip Use as instructed check blood sugar two times daily     liraglutide (VICTOZA) 18 MG/3ML SOPN Inject 0.3 mLs (1.8 mg total) into the skin every morning. 9 mL 1   lisinopril (ZESTRIL) 10 MG tablet Take 1 tablet (10 mg total) by mouth daily. 90 tablet 3   Menthol, Topical Analgesic, 8 % LIQD Apply 1 application topically 3 (three) times daily as needed (for knee pain.).      Methylcellulose, Laxative, 500 MG TABS Take 500 mg by mouth daily. Fiberwell Gummies     metoprolol succinate (TOPROL-XL) 25 MG 24 hr tablet TAKE 1/2 (ONE-HALF) TABLET BY MOUTH AT BEDTIME 45 tablet 2   MM PEN NEEDLES 31G X 8 MM MISC USE AS DIRECTED WITH  VICTOZA 100 each 0   Multiple Vitamin (MULTIVITAMIN WITH MINERALS) TABS tablet Take 1 tablet by mouth every evening.      Omega-3-6-9 CAPS Take 1 capsule by mouth every evening. 1600mg      omeprazole (PRILOSEC) 20 MG capsule Take 1 capsule (20 mg total) by mouth daily. 90 capsule 3   rosuvastatin (CRESTOR) 5 MG tablet Take 1 tablet (5 mg total) by mouth daily. 90 tablet 3   sildenafil (VIAGRA) 100 MG tablet Take 1 tablet (100 mg total) by mouth daily as needed. 18 tablet 3   SODIUM BICARBONATE PO Take 1 tablet by mouth 2 (two) times daily.      traZODone (DESYREL) 100 MG tablet Take 1 tablet (100 mg total) by mouth at bedtime. 90 tablet 3   trolamine salicylate  (ASPERCREME) 10 % cream Apply 1 application topically as needed (knee pain).     vitamin E 400 UNIT capsule Take 400 Units by mouth every evening.      No current facility-administered medications on file prior to visit.    Allergies  Allergen Reactions   Pioglitazone Swelling    Family History  Problem Relation Age of Onset   Cancer Mother        Colon Cancer   Colon cancer Mother 89   Colon polyps Brother    Esophageal cancer Neg Hx    Rectal cancer Neg Hx    Stomach cancer Neg Hx     There were no vitals taken for this visit.   Review of Systems     Objective:   Physical Exam GENERAL: no distress Pulses: dorsalis pedis intact bilat.   MSK: no deformity of the feet CV: 1+ left and trace right leg edema.   Skin:  no ulcer on the feet.  normal color and temp on the feet. Neuro: sensation is intact to touch on the feet.   Ext: there is bilateral onychomycosis of the toenails.   A1c=7.8%    Assessment & Plan:  Insulin-requiring type 2 DM: uncontrolled Hypoglycemia, due to insulin: this limits aggressiveness of glycemic control, so we'll increase insulin just slightly.    Patient Instructions  Please increase the insulin to 23 units with breakfast, and continue the same other 2 diabetes medications.   check your blood sugar twice a day.  vary the time of day when you check, between before the 3 meals, and at bedtime.  also check if you have symptoms of your blood sugar being too high or too low.  please keep a record of the readings and bring it to your next appointment here (or you can bring the meter itself).  You can write it on any piece of paper.  please call us sooner if your blood sugar goes below 70, or if you have a lot of readings over 200.   Please come back for a follow-up appointment in 4 months.

## 2020-10-28 DIAGNOSIS — N1832 Chronic kidney disease, stage 3b: Secondary | ICD-10-CM | POA: Diagnosis not present

## 2020-11-12 ENCOUNTER — Other Ambulatory Visit: Payer: Self-pay | Admitting: Endocrinology

## 2020-11-13 ENCOUNTER — Other Ambulatory Visit: Payer: Self-pay | Admitting: Family

## 2020-11-21 ENCOUNTER — Encounter: Payer: Self-pay | Admitting: Endocrinology

## 2020-11-21 DIAGNOSIS — E1122 Type 2 diabetes mellitus with diabetic chronic kidney disease: Secondary | ICD-10-CM

## 2020-11-21 DIAGNOSIS — N181 Chronic kidney disease, stage 1: Secondary | ICD-10-CM

## 2020-11-24 NOTE — Telephone Encounter (Signed)
Pt calling in stating that he has ran out of the medication and he receives patient assistance and patient assistance ([employer]) has faxed [place] over Wednesday, Thursday, Friday. Pt would like a call back once this is complete

## 2020-11-25 NOTE — Telephone Encounter (Signed)
Quillian Quince is calling from [employer] is calling regarding patient medication states that e-scribe is available  [place]- mail order  fax number [phone]. Pt is completely out of this medication

## 2020-11-26 ENCOUNTER — Other Ambulatory Visit: Payer: Self-pay | Admitting: Endocrinology

## 2020-11-26 NOTE — Telephone Encounter (Signed)
Patient requests to be called at ph# [phone] [id number] of RX for Wilder Glade (Patient goes through [employer] Patient assistance for [facility])  Patient has been requesting the above Rx since 11/21/20 (please see previous messages from Patient and [employer]).

## 2020-11-28 ENCOUNTER — Other Ambulatory Visit: Payer: Self-pay

## 2020-11-28 DIAGNOSIS — N181 Chronic kidney disease, stage 1: Secondary | ICD-10-CM

## 2020-11-28 DIAGNOSIS — E1122 Type 2 diabetes mellitus with diabetic chronic kidney disease: Secondary | ICD-10-CM

## 2020-11-28 MED ORDER — DAPAGLIFLOZIN PROPANEDIOL 10 MG PO TABS: 10.0000 mg | ORAL_TABLET | Freq: Every day | ORAL | 3 refills | Status: DC

## 2020-12-03 DIAGNOSIS — I129 Hypertensive chronic kidney disease with stage 1 through stage 4 chronic kidney disease, or unspecified chronic kidney disease: Secondary | ICD-10-CM | POA: Diagnosis not present

## 2020-12-03 DIAGNOSIS — N1832 Chronic kidney disease, stage 3b: Secondary | ICD-10-CM | POA: Diagnosis not present

## 2020-12-03 DIAGNOSIS — D631 Anemia in chronic kidney disease: Secondary | ICD-10-CM | POA: Diagnosis not present

## 2020-12-03 DIAGNOSIS — E872 Acidosis: Secondary | ICD-10-CM | POA: Diagnosis not present

## 2020-12-03 DIAGNOSIS — R609 Edema, unspecified: Secondary | ICD-10-CM | POA: Diagnosis not present

## 2020-12-03 DIAGNOSIS — E871 Hypo-osmolality and hyponatremia: Secondary | ICD-10-CM | POA: Diagnosis not present

## 2020-12-09 ENCOUNTER — Other Ambulatory Visit: Payer: Self-pay

## 2020-12-09 DIAGNOSIS — N181 Chronic kidney disease, stage 1: Secondary | ICD-10-CM

## 2020-12-09 DIAGNOSIS — E1122 Type 2 diabetes mellitus with diabetic chronic kidney disease: Secondary | ICD-10-CM

## 2020-12-09 MED ORDER — DAPAGLIFLOZIN PROPANEDIOL 10 MG PO TABS: 10.0000 mg | ORAL_TABLET | Freq: Every day | ORAL | 3 refills | Status: DC

## 2020-12-30 NOTE — Telephone Encounter (Signed)
Pt calling to follow up on last message states that he would like a alternative to the Farxiga   [id number] - [place],  - [place].

## 2020-12-30 NOTE — Telephone Encounter (Signed)
Loyal Gambler Nurse Practitioner with [employer] ph# [phone] called re: Patient's A1c is 9.1 and Patient has been out of dapagliflozin propanediol (FARXIGA) 10 MG TABS tablet For a month.

## 2020-12-31 ENCOUNTER — Other Ambulatory Visit: Payer: Self-pay | Admitting: Endocrinology

## 2020-12-31 DIAGNOSIS — N181 Chronic kidney disease, stage 1: Secondary | ICD-10-CM

## 2020-12-31 DIAGNOSIS — E1122 Type 2 diabetes mellitus with diabetic chronic kidney disease: Secondary | ICD-10-CM

## 2020-12-31 MED ORDER — DAPAGLIFLOZIN PROPANEDIOL 10 MG PO TABS: 10.0000 mg | ORAL_TABLET | Freq: Every day | ORAL | 3 refills | Status: DC

## 2021-01-05 NOTE — Telephone Encounter (Signed)
Patient came in today and left a paper dates 11/22/20 from [facility] re: Wilder Glade - letter advises that they need RX to be sent - paper placed in providers inbox at front desk

## 2021-01-06 ENCOUNTER — Ambulatory Visit: Payer: Medicare Other | Attending: Internal Medicine

## 2021-01-06 ENCOUNTER — Telehealth: Payer: Self-pay

## 2021-01-06 ENCOUNTER — Other Ambulatory Visit: Payer: Self-pay

## 2021-01-06 ENCOUNTER — Ambulatory Visit (INDEPENDENT_AMBULATORY_CARE_PROVIDER_SITE_OTHER): Payer: Medicare Other

## 2021-01-06 ENCOUNTER — Encounter: Payer: Self-pay | Admitting: Family

## 2021-01-06 DIAGNOSIS — Z23 Encounter for immunization: Secondary | ICD-10-CM

## 2021-01-06 NOTE — Telephone Encounter (Signed)
LVM for pt to contact me or send me a message thru MyChart regarding clarification on where he is wanting the script for farxiga to go to bc at one point he wanted it to go to a different place.

## 2021-01-06 NOTE — Progress Notes (Signed)
David Lindsey is a 65 y.o. male presents to the office today for High Dose Flu Shot , per physician's orders. Original order: 01/06/2021 Fluquad High Dose 0.5 ML,  IM was administered R deltoid (location) today. Patient tolerated injection.  Given VIS and Consent form.    Creft, Darlis Loan

## 2021-01-06 NOTE — Progress Notes (Signed)
   Covid-19 Vaccination Clinic  Name:  David Lindsey    MRN: [id number] DOB: Feb 02, 1956  01/06/2021  [name] was observed post Covid-19 immunization for 15 minutes without incident. He was provided with Vaccine Information Sheet and instruction to access the V-Safe system.   [name] was instructed to call 911 with any severe reactions post vaccine: Difficulty breathing  Swelling of face and throat  A fast heartbeat  A bad rash all over body  Dizziness and weakness

## 2021-01-07 ENCOUNTER — Telehealth: Payer: Self-pay

## 2021-01-07 NOTE — Telephone Encounter (Signed)
LVM for pt to let him know that we have received Victozia from [facility] and it is ready for pick up.

## 2021-01-13 ENCOUNTER — Other Ambulatory Visit (HOSPITAL_BASED_OUTPATIENT_CLINIC_OR_DEPARTMENT_OTHER): Payer: Self-pay

## 2021-01-13 ENCOUNTER — Telehealth: Payer: Self-pay | Admitting: Family

## 2021-01-13 MED ORDER — COVID-19MRNA BIVAL VACC PFIZER 30 MCG/0.3ML IM SUSP
INTRAMUSCULAR | 0 refills | Status: DC
  Filled 2021-01-13: qty 0.3, 1d supply, fill #0

## 2021-01-13 NOTE — Telephone Encounter (Signed)
Received notification that patient had negative Hep C test on January 01, 2021.

## 2021-01-27 DIAGNOSIS — H02831 Dermatochalasis of right upper eyelid: Secondary | ICD-10-CM | POA: Diagnosis not present

## 2021-01-27 DIAGNOSIS — H26493 Other secondary cataract, bilateral: Secondary | ICD-10-CM | POA: Diagnosis not present

## 2021-01-27 DIAGNOSIS — H02834 Dermatochalasis of left upper eyelid: Secondary | ICD-10-CM | POA: Diagnosis not present

## 2021-01-27 DIAGNOSIS — Z961 Presence of intraocular lens: Secondary | ICD-10-CM | POA: Diagnosis not present

## 2021-01-27 DIAGNOSIS — E119 Type 2 diabetes mellitus without complications: Secondary | ICD-10-CM | POA: Diagnosis not present

## 2021-01-27 DIAGNOSIS — Z794 Long term (current) use of insulin: Secondary | ICD-10-CM | POA: Diagnosis not present

## 2021-01-28 ENCOUNTER — Other Ambulatory Visit: Payer: Self-pay | Admitting: Endocrinology

## 2021-02-01 ENCOUNTER — Other Ambulatory Visit: Payer: Self-pay | Admitting: Family

## 2021-02-06 ENCOUNTER — Other Ambulatory Visit: Payer: Self-pay

## 2021-02-06 ENCOUNTER — Ambulatory Visit (INDEPENDENT_AMBULATORY_CARE_PROVIDER_SITE_OTHER): Payer: Medicare Other | Admitting: Endocrinology

## 2021-02-06 VITALS — BP 100/50 | HR 87 | Ht 71.0 in | Wt 302.4 lb

## 2021-02-06 DIAGNOSIS — E1122 Type 2 diabetes mellitus with diabetic chronic kidney disease: Secondary | ICD-10-CM

## 2021-02-06 DIAGNOSIS — N181 Chronic kidney disease, stage 1: Secondary | ICD-10-CM | POA: Diagnosis not present

## 2021-02-06 LAB — POCT GLYCOSYLATED HEMOGLOBIN (HGB A1C): Hemoglobin A1C: 8.8 % — AB (ref 4.0–5.6)

## 2021-02-06 MED ORDER — INSULIN LISPRO PROT & LISPRO (75-25 MIX) 100 UNIT/ML KWIKPEN: 30.0000 [IU] | PEN_INJECTOR | Freq: Every day | SUBCUTANEOUS | 11 refills | Status: DC

## 2021-02-06 NOTE — Progress Notes (Signed)
Subjective:    Patient ID: David Lindsey, male    DOB: 1956/03/14, 65 y.o.   MRN: [id number]  HPI Pt returns for f/u of diabetes mellitus: DM type: Insulin-requiring type 2.   Dx'ed: 8295 Complications: stage 4 CRI, and PN Therapy: insulin since 2019, Victoza, and [place].   DKA: never.   Severe hypoglycemia: never.   Pancreatitis: never.   SDOH: he gets meds from [facility]--pt assistance.   Other: he did not tolerate parlodel (tremor); he cannot take pioglitizone (edema); he takes qd insulin, after poor results with multiple daily injections; he is retired.   Interval history: He has had difficulty obtaining Wilder Glade, so he has missed x 2 mos.  no cbg record, but states cbg's varies from 92-162.  He takes 28 units QAM Past Medical History:  Diagnosis Date   ALCOHOLISM 12/22/2007   ANEMIA, IRON DEFICIENCY 12/26/2007   Arthritis    OA   Blood transfusion without reported diagnosis    as a child   DIABETES MELLITUS, TYPE II 11/10/2006   ESOPHAGITIS, REFLUX 12/22/2007   GERD (gastroesophageal reflux disease)    HYPERCHOLESTEROLEMIA 12/22/2007   HYPERTENSION 11/10/2006   HYPOGONADISM 12/22/2007   Prob. due to ETOH   HYPOKALEMIA 06/06/2008   Leukopenia    Neuromuscular disorder (HCC)    neuropathy in feet from Diabetes   Neuropathy    Chronic Neuropathy BOTH FEET   Pancytopenia 06/06/2008   RENAL INSUFFICIENCY 11/10/2006   SEES [place] KIDNEY STAGE 2 CKD    Past Surgical History:  Procedure Laterality Date   APPENDECTOMY  AGE 14 OR 10   COLONOSCOPY  08-21-2004/2017   [facility]- normal/2017 [place]   COLONSCOPY  2017   ELECTROCARDIOGRAM  11/07/2006   ESOPHAGOGASTRODUODENOSCOPY  08/20/2004   EYE SURGERY Bilateral 2014   IOC FOR CATARACTS   I & D KNEE WITH POLY EXCHANGE Left 05/16/2019   Procedure: LEFT KNEE IRRIGATION AND DEBRIDEMENT WITH POLY EXCHANGE;  Surgeon: Frederik Pear, MD;  Location: [facility];  Service: Orthopedics;  Laterality: Left;   REPLACEMENT TOTAL KNEE Right 2011   TOTAL KNEE  ARTHROPLASTY Left 02/20/2018   Procedure: LEFT TOTAL KNEE ARTHROPLASTY;  Surgeon: Frederik Pear, MD;  Location: [facility];  Service: Orthopedics;  Laterality: Left;   UPPER GASTROINTESTINAL ENDOSCOPY      Social History   Socioeconomic History   Marital status: Married    Spouse name: Not on file   Number of children: Not on file   Years of education: Not on file   Highest education level: Not on file  Occupational History   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  Tobacco Use   Smoking status: Never   Smokeless tobacco: Never  Vaping Use   Vaping Use: Never used  Substance and Sexual Activity   Alcohol use: No    Alcohol/week: 0.0 standard drinks    Comment: LAST USE 10 YRS AGO   Drug use: No   Sexual activity: Not on file  Other Topics Concern   Not on file  Social History Narrative   Not on file   Social Determinants of Health   Financial Resource Strain: Not on file  Food Insecurity: Not on file  Transportation Needs: Not on file  Physical Activity: Not on file  Stress: Not on file  Social Connections: Not on file  Intimate Partner Violence: Not on file    Current Outpatient Medications on File Prior to Visit  Medication Sig Dispense Refill   amLODipine ([place]) 10  MG tablet Take 1 tablet (10 mg total) by mouth daily. 90 tablet 3   Apple Cider Vinegar 600 MG CAPS Take 600 mg by mouth daily.      Ascorbic Acid (VITAMIN C) 1000 MG tablet Take 1,000 mg by mouth every evening.     Biotin w/ Vitamins C & E (HAIR SKIN & NAILS GUMMIES PO) Take 1 tablet by mouth every evening.     Carboxymethylcellul-Glycerin (LUBRICATING EYE DROPS OP) Place 1 drop into both eyes daily as needed (dry eyes).      cholecalciferol (VITAMIN D3) 25 MCG (1000 UNIT) tablet Take 1,000 Units by mouth daily.     clotrimazole-betamethasone (LOTRISONE) cream APPLY  CREAM TOPICALLY THREE TIMES DAILY AS NEEDED FOR  RASH 45 g 0   Coenzyme Q10 (COQ-10) 200 MG CAPS Take 200 mg by mouth every  evening.      COVID-19 mRNA bivalent vaccine, Pfizer, injection Inject into the muscle. 0.3 mL 0   Cyanocobalamin (B-12) 5000 MCG CAPS Take 5,000 mcg by mouth every evening.      dapagliflozin propanediol (FARXIGA) 10 MG TABS tablet Take 1 tablet (10 mg total) by mouth daily. 90 tablet 3   docusate sodium (COLACE) 100 MG capsule Take 200 mg by mouth every evening.      furosemide (LASIX) 20 MG tablet Take 20 mg by mouth.     gabapentin (NEURONTIN) 300 MG capsule Take 1 capsule (300 mg total) by mouth 3 (three) times daily. 270 capsule 3   glucose blood test strip Use as instructed check blood sugar two times daily     liraglutide (VICTOZA) 18 MG/3ML SOPN Inject 0.3 mLs (1.8 mg total) into the skin every morning. 9 mL 1   lisinopril (ZESTRIL) 10 MG tablet TAKE 1 TABLET BY MOUTH EVERY DAY 30 tablet 0   Menthol, Topical Analgesic, 8 % LIQD Apply 1 application topically 3 (three) times daily as needed (for knee pain.).      Methylcellulose, Laxative, 500 MG TABS Take 500 mg by mouth daily. Fiberwell Gummies     metoprolol succinate (TOPROL-XL) 25 MG 24 hr tablet TAKE 1/2 (ONE-HALF) TABLET BY MOUTH AT BEDTIME 45 tablet 2   MM PEN NEEDLES 31G X 8 MM MISC AS DIRECTED WITH  VICTOZA 100 each 0   Multiple Vitamin (MULTIVITAMIN WITH MINERALS) TABS tablet Take 1 tablet by mouth every evening.      Omega-3-6-9 CAPS Take 1 capsule by mouth every evening. 1600mg      omeprazole (PRILOSEC) 20 MG capsule Take 1 capsule (20 mg total) by mouth daily. 90 capsule 3   rosuvastatin (CRESTOR) 5 MG tablet TAKE 1 TABLET (5 MG TOTAL) BY MOUTH DAILY. 30 tablet 0   sildenafil (VIAGRA) 100 MG tablet Take 1 tablet (100 mg total) by mouth daily as needed. 18 tablet 3   SODIUM BICARBONATE PO Take 1 tablet by mouth 2 (two) times daily.      traZODone (DESYREL) 100 MG tablet Take 1 tablet (100 mg total) by mouth at bedtime. 90 tablet 3   trolamine salicylate (ASPERCREME) 10 % cream Apply 1 application topically as needed (knee  pain).     vitamin E 400 UNIT capsule Take 400 Units by mouth every evening.      No current facility-administered medications on file prior to visit.    Allergies  Allergen Reactions   Pioglitazone Swelling    Family History  Problem Relation Age of Onset   Cancer Mother  Colon Cancer   Colon cancer Mother 46   Colon polyps Brother    Esophageal cancer Neg Hx    Rectal cancer Neg Hx    Stomach cancer Neg Hx     BP (!) 100/50 (BP Location: Right Arm, Patient Position: Sitting, Cuff Size: Large)   Pulse 87   Ht 5\' 11"  (1.803 m)   Wt (!) 302 lb 6.4 oz (137.2 kg)   SpO2 97%   BMI 42.18 kg/m    Review of Systems He denies hypoglycemia.      Objective:   Physical Exam  Lab Results  Component Value Date   CREATININE 2.21 (H) 10/18/2019   BUN 21 10/18/2019   NA 137 10/18/2019   K 4.7 10/18/2019   CL 103 10/18/2019   CO2 26 10/18/2019    Lab Results  Component Value Date   HGBA1C 8.8 (A) 02/06/2021      Assessment & Plan:  Insulin-requiring type 2 DM: uncontrolled  Patient Instructions  Please increase the insulin to 30 units with breakfast, and continue the same other 2 diabetes medications.   check your blood sugar twice a day.  vary the time of day when you check, between before the 3 meals, and at bedtime.  also check if you have symptoms of your blood sugar being too high or too low.  please keep a record of the readings and bring it to your next appointment here (or you can bring the meter itself).  You can write it on any piece of paper.  please call us sooner if your blood sugar goes below 70, or if you have a lot of readings over 200.    Please come back for a follow-up appointment in 3 months.

## 2021-02-06 NOTE — Patient Instructions (Addendum)
Please increase the insulin to 30 units with breakfast, and continue the same other 2 diabetes medications.   check your blood sugar twice a day.  vary the time of day when you check, between before the 3 meals, and at bedtime.  also check if you have symptoms of your blood sugar being too high or too low.  please keep a record of the readings and bring it to your next appointment here (or you can bring the meter itself).  You can write it on any piece of paper.  please call us sooner if your blood sugar goes below 70, or if you have a lot of readings over 200.    Please come back for a follow-up appointment in 3 months.

## 2021-03-10 ENCOUNTER — Other Ambulatory Visit: Payer: Self-pay

## 2021-03-10 ENCOUNTER — Encounter: Payer: Self-pay | Admitting: Internal Medicine

## 2021-03-10 ENCOUNTER — Ambulatory Visit (INDEPENDENT_AMBULATORY_CARE_PROVIDER_SITE_OTHER): Payer: Medicare Other | Admitting: Internal Medicine

## 2021-03-10 ENCOUNTER — Telehealth: Payer: Self-pay | Admitting: Endocrinology

## 2021-03-10 DIAGNOSIS — T8459XD Infection and inflammatory reaction due to other internal joint prosthesis, subsequent encounter: Secondary | ICD-10-CM | POA: Diagnosis not present

## 2021-03-10 DIAGNOSIS — Z96659 Presence of unspecified artificial knee joint: Secondary | ICD-10-CM | POA: Diagnosis not present

## 2021-03-10 NOTE — Telephone Encounter (Signed)
Patient dropped off [employer] Application to be completed by Dr. Loanne Drilling and faxed to [facility] once completed (Fax# listed on application). [employer] Application has bee placed in Dr. Cordelia Pen box at front desk.

## 2021-03-10 NOTE — Assessment & Plan Note (Signed)
I doubt that his recent mild and intermittent left knee pain is due to relapsed or new prosthetic joint infection.  I suggested that he follow-up with Dr. Paulo Fruit, his orthopedic surgeon.  I will follow-up with him by phone in 3 to 4 weeks.

## 2021-03-10 NOTE — Progress Notes (Signed)
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/25/2017   Arthralgia 02/24/2017   Edema 01/13/2017   Wellness examination 01/30/2015   Myalgia and myositis 01/28/2014   Unspecified constipation 03/13/2013   Screening for prostate cancer 01/18/2013   Diabetes mellitus with renal manifestation ([place]) 01/18/2013   Encounter for long-term (current) use of other medications 03/24/2012   HYPOKALEMIA 06/06/2008   Pancytopenia ([place]) 06/06/2008   Hypogonadism, male 12/22/2007   HYPERCHOLESTEROLEMIA 12/22/2007   ALCOHOLISM 12/22/2007   ESOPHAGITIS, REFLUX 12/22/2007   Leg pain 12/22/2007   Essential hypertension 11/10/2006   Disorder resulting from impaired renal function 11/10/2006    Patient's Medications  New Prescriptions   No medications on file  Previous Medications   AMLODIPINE ([place]) 10 MG TABLET    Take 1 tablet (10 mg total) by mouth daily.   APPLE CIDER VINEGAR 600 MG CAPS    Take 600 mg by mouth daily.    ASCORBIC ACID (VITAMIN C) 1000 MG TABLET    Take 1,000 mg by mouth every evening.   BIOTIN W/ VITAMINS C & E (HAIR SKIN & NAILS GUMMIES PO)    Take 1 tablet by mouth every evening.   CARBOXYMETHYLCELLUL-GLYCERIN (LUBRICATING EYE DROPS OP)    Place 1 drop into both eyes daily as needed (dry eyes).    CHOLECALCIFEROL (VITAMIN D3) 25 MCG (1000 UNIT) TABLET    Take 1,000 Units by mouth daily.   CLOTRIMAZOLE-BETAMETHASONE (LOTRISONE) CREAM    APPLY  CREAM TOPICALLY THREE TIMES DAILY AS NEEDED FOR  RASH   COENZYME Q10 (COQ-10) 200 MG CAPS    Take 200 mg by mouth every evening.    COVID-19 MRNA  BIVALENT VACCINE, PFIZER, INJECTION    Inject into the muscle.   CYANOCOBALAMIN (B-12) 5000 MCG CAPS    Take 5,000 mcg by mouth every evening.    DAPAGLIFLOZIN PROPANEDIOL (FARXIGA) 10 MG TABS TABLET    Take 1 tablet (10 mg total) by mouth daily.   DOCUSATE SODIUM (COLACE) 100 MG CAPSULE    Take 200 mg by mouth every evening.    FUROSEMIDE (LASIX) 20 MG TABLET    Take 20 mg by mouth.   GABAPENTIN (NEURONTIN) 300 MG CAPSULE    Take 1 capsule (300 mg total) by mouth 3 (three) times daily.   GLUCOSE BLOOD TEST STRIP    Use as instructed check blood sugar two times daily   INSULIN LISPRO PROT & LISPRO (HUMALOG MIX 75/25 KWIKPEN) (75-25) 100 UNIT/ML KWIKPEN    Inject 30 Units into the skin daily with breakfast. PATIENT USES LILLY CARES PAP   LIRAGLUTIDE (VICTOZA) 18 MG/3ML SOPN    Inject 0.3 mLs (1.8 mg total) into the skin every morning.   LISINOPRIL (ZESTRIL) 10 MG TABLET    TAKE 1 TABLET BY MOUTH EVERY DAY   MENTHOL, TOPICAL ANALGESIC, 8 % LIQD    Apply 1 application topically 3 (three) times daily as needed (for knee pain.).    METHYLCELLULOSE, LAXATIVE, 500 MG TABS    Take 500 mg by mouth daily. Fiberwell Gummies   METOPROLOL SUCCINATE (  TOPROL-XL) 25 MG 24 HR TABLET    TAKE 1/2 (ONE-HALF) TABLET BY MOUTH AT BEDTIME   MM PEN NEEDLES 31G X 8 MM MISC    AS DIRECTED WITH  VICTOZA   MULTIPLE VITAMIN (MULTIVITAMIN WITH MINERALS) TABS TABLET    Take 1 tablet by mouth every evening.    OMEGA-3-6-9 CAPS    Take 1 capsule by mouth every evening. 1600mg    OMEPRAZOLE (PRILOSEC) 20 MG CAPSULE    Take 1 capsule (20 mg total) by mouth daily.   ROSUVASTATIN (CRESTOR) 5 MG TABLET    TAKE 1 TABLET (5 MG TOTAL) BY MOUTH DAILY.   SILDENAFIL (VIAGRA) 100 MG TABLET    Take 1 tablet (100 mg total) by mouth daily as needed.   SODIUM BICARBONATE PO    Take 1 tablet by mouth 2 (two) times daily.    TRAZODONE (DESYREL) 100 MG TABLET    Take 1 tablet (100 mg total) by mouth at bedtime.   TROLAMINE SALICYLATE (ASPERCREME)  10 % CREAM    Apply 1 application topically as needed (knee pain).   VITAMIN E 400 UNIT CAPSULE    Take 400 Units by mouth every evening.   Modified Medications   No medications on file  Discontinued Medications   No medications on file    Subjective: David Lindsey is seen on a walk-in basis today.  He is a 65 y.o. 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  He did well until mid January last year when he had sudden onset of swelling, pain, redness and warmth in his left knee.  He started having fever and sweats.  He was evaluated on 05/04/2019 and was treated with cephalexin for cellulitis.  He did not improve and underwent arthrocentesis on 05/08/2019.  Doxycycline was added to his therapy.  Synovial fluid analyses showed 82,250 white blood cells of which 86% were segmented neutrophils.  No organisms were seen on Gram stain but culture grew Serratia.  He was admitted to the hospital and underwent incision and drainage with polyethylene exchange.  I treated him with oral levofloxacin for 6 weeks before switching him to oral trimethoprim/sulfamethoxazole.  He had rapid improvement and completed 9 months of therapy in October of last year.  He recently helped his wife set up tables for a craft fair.  Shortly after that he noted that he developed some intermittent burning pain on the lateral side of his left knee.  It started about 1 week ago.  The pain is most notable when he first stands up.  He has not had any new swelling, redness or warmth of his left knee.  He has not had any fever, chills or sweats.  Review of Systems: Review of Systems  Constitutional:  Negative for chills, diaphoresis and fever.  Musculoskeletal:  Positive for joint pain.   Past Medical History:  Diagnosis Date   ALCOHOLISM 12/22/2007   ANEMIA, IRON DEFICIENCY 12/26/2007   Arthritis    OA   Blood transfusion without reported diagnosis    as a child   DIABETES MELLITUS, TYPE II 11/10/2006    ESOPHAGITIS, REFLUX 12/22/2007   GERD (gastroesophageal reflux disease)    HYPERCHOLESTEROLEMIA 12/22/2007   HYPERTENSION 11/10/2006   HYPOGONADISM 12/22/2007   Prob. due to ETOH   HYPOKALEMIA 06/06/2008   Leukopenia    Neuromuscular disorder (HCC)    neuropathy in feet from Diabetes   Neuropathy    Chronic Neuropathy BOTH FEET   Pancytopenia 06/06/2008  RENAL INSUFFICIENCY 11/10/2006   SEES [place] KIDNEY STAGE 2 CKD    Social History   Tobacco Use   Smoking status: Never   Smokeless tobacco: Never  Vaping Use   Vaping Use: Never used  Substance Use Topics   Alcohol use: No    Alcohol/week: 0.0 standard drinks    Comment: LAST USE 10 YRS AGO   Drug use: No    Family History  Problem Relation Age of Onset   Cancer Mother        Colon Cancer   Colon cancer Mother 57   Colon polyps Brother    Esophageal cancer Neg Hx    Rectal cancer Neg Hx    Stomach cancer Neg Hx     Allergies  Allergen Reactions   Pioglitazone Swelling    Objective: Vitals:   03/10/21 1506  BP: (!) 142/82  Pulse: 89  Resp: 16  Temp: (!) 97.4 F (36.3 C)  TempSrc: Temporal  SpO2: 98%  Weight: 300 lb (136.1 kg)  Height: 5\' 11"  (1.803 m)   Body mass index is 41.84 kg/m.  Physical Exam Constitutional:      Comments: He is in good spirits.  Cardiovascular:     Rate and Rhythm: Normal rate.  Pulmonary:     Effort: Pulmonary effort is normal.  Musculoskeletal:        General: No swelling or tenderness.     Comments: His left knee incision has healed nicely without evidence of infection.  He has no warmth or redness and there is no change in chronic mild swelling of his left knee.  He has very mild point tenderness laterally.  Neurological:     Gait: Gait normal.  Psychiatric:        Mood and Affect: Mood normal.    Lab Results    Problem List Items Addressed This Visit       High   Infection of total knee replacement ([place])    I doubt that his recent mild and intermittent  left knee pain is due to relapsed or new prosthetic joint infection.  I suggested that he follow-up with Dr. Paulo Fruit, his orthopedic surgeon.  I will follow-up with him by phone in 3 to 4 weeks.        Michel Bickers, MD [facility] [phone] pager   [phone] cell 03/10/2021, 4:23 PM

## 2021-03-12 DIAGNOSIS — M25562 Pain in left knee: Secondary | ICD-10-CM | POA: Diagnosis not present

## 2021-03-20 ENCOUNTER — Encounter: Payer: Self-pay | Admitting: Endocrinology

## 2021-03-24 ENCOUNTER — Encounter: Payer: Self-pay | Admitting: Endocrinology

## 2021-03-25 ENCOUNTER — Other Ambulatory Visit: Payer: Self-pay

## 2021-03-25 DIAGNOSIS — E1122 Type 2 diabetes mellitus with diabetic chronic kidney disease: Secondary | ICD-10-CM

## 2021-03-25 MED ORDER — INSULIN LISPRO PROT & LISPRO (75-25 MIX) 100 UNIT/ML KWIKPEN: 30.0000 [IU] | PEN_INJECTOR | Freq: Every day | SUBCUTANEOUS | 11 refills | Status: DC

## 2021-03-25 NOTE — Telephone Encounter (Signed)
Papers has been faxed 

## 2021-03-26 ENCOUNTER — Other Ambulatory Visit: Payer: Self-pay | Admitting: Family

## 2021-03-27 NOTE — Telephone Encounter (Signed)
Refill rx for a month or a year? Pt has an upcoming appt on 04/02/21 @ 10:40a.

## 2021-03-30 ENCOUNTER — Encounter: Payer: Self-pay | Admitting: Endocrinology

## 2021-03-31 ENCOUNTER — Other Ambulatory Visit: Payer: Self-pay

## 2021-03-31 ENCOUNTER — Ambulatory Visit (INDEPENDENT_AMBULATORY_CARE_PROVIDER_SITE_OTHER): Payer: Medicare Other | Admitting: Internal Medicine

## 2021-03-31 ENCOUNTER — Encounter: Payer: Self-pay | Admitting: Internal Medicine

## 2021-03-31 DIAGNOSIS — Z96659 Presence of unspecified artificial knee joint: Secondary | ICD-10-CM | POA: Diagnosis not present

## 2021-03-31 DIAGNOSIS — T8459XD Infection and inflammatory reaction due to other internal joint prosthesis, subsequent encounter: Secondary | ICD-10-CM | POA: Diagnosis not present

## 2021-03-31 NOTE — Progress Notes (Signed)
Virtual Visit via Telephone Note  I connected with David Lindsey on 03/31/21 at  3:45 PM EST by telephone and verified that I am speaking with the correct person using two identifiers.  Location: Patient: Home Provider: [facility]   I discussed the limitations, risks, security and privacy concerns of performing an evaluation and management service by telephone and the availability of in person appointments. I also discussed with the patient that there may be a patient responsible charge related to this service. The patient expressed understanding and agreed to proceed.   History of Present Illness: I called and spoke with David Lindsey today.  He tells me that his recent left knee pain is improving spontaneously.  He saw his orthopedic surgeon, Dr. Frederik Pear, recently who examined him and told him that there was no problems with his artificial knee.   Observations/Objective:   Assessment and Plan: There is no reason to believe that he has any recurrent infection in his left knee prosthesis.  Follow Up Instructions: No follow-up care required.   I discussed the assessment and treatment plan with the patient. The patient was provided an opportunity to ask questions and all were answered. The patient agreed with the plan and demonstrated an understanding of the instructions.   The patient was advised to call back or seek an in-person evaluation if the symptoms worsen or if the condition fails to improve as anticipated.  I provided 15 minutes of non-face-to-face time during this encounter.   Michel Bickers, MD

## 2021-04-02 ENCOUNTER — Ambulatory Visit (INDEPENDENT_AMBULATORY_CARE_PROVIDER_SITE_OTHER): Payer: Medicare Other | Admitting: Family

## 2021-04-02 ENCOUNTER — Telehealth: Payer: Self-pay | Admitting: Family

## 2021-04-02 ENCOUNTER — Encounter: Payer: Self-pay | Admitting: Family

## 2021-04-02 VITALS — BP 124/80 | HR 78 | Temp 98.1°F | Ht 71.0 in | Wt 303.0 lb

## 2021-04-02 DIAGNOSIS — Z125 Encounter for screening for malignant neoplasm of prostate: Secondary | ICD-10-CM | POA: Diagnosis not present

## 2021-04-02 DIAGNOSIS — E291 Testicular hypofunction: Secondary | ICD-10-CM | POA: Diagnosis not present

## 2021-04-02 DIAGNOSIS — Z13220 Encounter for screening for lipoid disorders: Secondary | ICD-10-CM

## 2021-04-02 DIAGNOSIS — Z0000 Encounter for general adult medical examination without abnormal findings: Secondary | ICD-10-CM

## 2021-04-02 LAB — CBC WITH DIFFERENTIAL/PLATELET
Basophils Absolute: 0 10*3/uL (ref 0.0–0.1)
Basophils Relative: 0.6 % (ref 0.0–3.0)
Eosinophils Absolute: 0.3 10*3/uL (ref 0.0–0.7)
Eosinophils Relative: 6.4 % — ABNORMAL HIGH (ref 0.0–5.0)
HCT: 42.1 % (ref 39.0–52.0)
Hemoglobin: 14.1 g/dL (ref 13.0–17.0)
Lymphocytes Relative: 30.9 % (ref 12.0–46.0)
Lymphs Abs: 1.2 10*3/uL (ref 0.7–4.0)
MCHC: 33.4 g/dL (ref 30.0–36.0)
MCV: 86.3 fl (ref 78.0–100.0)
Monocytes Absolute: 0.4 10*3/uL (ref 0.1–1.0)
Monocytes Relative: 9.6 % (ref 3.0–12.0)
Neutro Abs: 2.1 10*3/uL (ref 1.4–7.7)
Neutrophils Relative %: 52.5 % (ref 43.0–77.0)
Platelets: 144 10*3/uL — ABNORMAL LOW (ref 150.0–400.0)
RBC: 4.88 Mil/uL (ref 4.22–5.81)
RDW: 12.7 % (ref 11.5–15.5)
WBC: 4 10*3/uL (ref 4.0–10.5)

## 2021-04-02 LAB — COMPREHENSIVE METABOLIC PANEL
ALT: 21 U/L (ref 0–53)
AST: 18 U/L (ref 0–37)
Albumin: 4.2 g/dL (ref 3.5–5.2)
Alkaline Phosphatase: 66 U/L (ref 39–117)
BUN: 20 mg/dL (ref 6–23)
CO2: 27 mEq/L (ref 19–32)
Calcium: 9.5 mg/dL (ref 8.4–10.5)
Chloride: 101 mEq/L (ref 96–112)
Creatinine, Ser: 2.01 mg/dL — ABNORMAL HIGH (ref 0.40–1.50)
GFR: 33.76 mL/min — ABNORMAL LOW (ref >60.00)
Glucose, Bld: 206 mg/dL — ABNORMAL HIGH (ref 70–99)
Potassium: 4.2 mEq/L (ref 3.5–5.1)
Sodium: 135 mEq/L (ref 135–145)
Total Bilirubin: 0.4 mg/dL (ref 0.2–1.2)
Total Protein: 7.3 g/dL (ref 6.0–8.3)

## 2021-04-02 LAB — PSA: PSA: 0.85 ng/mL (ref 0.10–4.00)

## 2021-04-02 LAB — LIPID PANEL
Cholesterol: 143 mg/dL (ref 0–200)
HDL: 38.6 mg/dL — ABNORMAL LOW (ref >39.00)
LDL Cholesterol: 81 mg/dL (ref 0–99)
NonHDL: 104.67
Total CHOL/HDL Ratio: 4
Triglycerides: 119 mg/dL (ref 0.0–149.0)
VLDL: 23.8 mg/dL (ref 0.0–40.0)

## 2021-04-02 MED ORDER — SILDENAFIL CITRATE 100 MG PO TABS: 100.0000 mg | ORAL_TABLET | Freq: Every day | ORAL | 3 refills | Status: DC | PRN

## 2021-04-02 MED ORDER — METOPROLOL SUCCINATE ER 25 MG PO TB24: ORAL_TABLET | ORAL | 3 refills | Status: DC

## 2021-04-02 MED ORDER — FUROSEMIDE 20 MG PO TABS: 20.0000 mg | ORAL_TABLET | Freq: Every day | ORAL | 3 refills | Status: DC

## 2021-04-02 MED ORDER — OMEPRAZOLE 20 MG PO CPDR: 20.0000 mg | DELAYED_RELEASE_CAPSULE | Freq: Every day | ORAL | 3 refills | Status: DC

## 2021-04-02 MED ORDER — ROSUVASTATIN CALCIUM 5 MG PO TABS: 5.0000 mg | ORAL_TABLET | Freq: Every day | ORAL | 3 refills | Status: DC

## 2021-04-02 MED ORDER — LISINOPRIL 10 MG PO TABS: 10.0000 mg | ORAL_TABLET | Freq: Every day | ORAL | 3 refills | Status: DC

## 2021-04-02 MED ORDER — AMLODIPINE BESYLATE 10 MG PO TABS: 10.0000 mg | ORAL_TABLET | Freq: Every day | ORAL | 3 refills | Status: DC

## 2021-04-02 NOTE — Telephone Encounter (Signed)
I went back through his medication list and I want to make sure we are on the same page about the Furosemide ( Lasix). Last year, I marked that it was discontinued. This year, it was active again. Did his kidney doctor put him back on it? I just want to make sure we don't have him taking too much medication.  The Amlodipine, Metoprolol, Lisinopril look correct.

## 2021-04-02 NOTE — Progress Notes (Signed)
David Lindsey is a 65 y.o. 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[place]) 05/15/2019   S/P total knee arthroplasty, left 02/20/2018   Degenerative arthritis of left knee 11/18/2017   Left shoulder pain 08/29/2017   History of total right knee replacement (TKR) 03/25/2017   Arthralgia 02/24/2017   Edema 01/13/2017   Wellness examination 01/30/2015   Myalgia and myositis 01/28/2014   Unspecified constipation 03/13/2013   Screening for prostate cancer 01/18/2013   Diabetes mellitus with renal manifestation ([place]) 01/18/2013   Encounter for long-term (current) use of other medications 03/24/2012   HYPOKALEMIA 06/06/2008   Pancytopenia ([place]) 06/06/2008   Hypogonadism, male 12/22/2007   HYPERCHOLESTEROLEMIA 12/22/2007   ALCOHOLISM 12/22/2007   ESOPHAGITIS, REFLUX 12/22/2007   Leg pain 12/22/2007   Essential hypertension 11/10/2006   Disorder resulting from impaired renal function 11/10/2006    Current Outpatient Medications  Medication Sig Dispense Refill   Ascorbic Acid (VITAMIN C) 1000 MG tablet Take 1,000 mg by mouth every evening.     Biotin w/ Vitamins C & E (HAIR SKIN & NAILS GUMMIES PO) Take 1 tablet by mouth every evening.     Carboxymethylcellul-Glycerin (LUBRICATING EYE DROPS OP) Place 1 drop into both eyes daily as needed (dry eyes).      cholecalciferol (VITAMIN D3) 25 MCG (1000 UNIT) tablet Take 1,000 Units by mouth daily.     clotrimazole-betamethasone (LOTRISONE) cream APPLY  CREAM TOPICALLY THREE TIMES DAILY AS NEEDED FOR  RASH 45 g 0   Coenzyme Q10 (COQ-10) 200 MG CAPS Take 200 mg by mouth every evening.      Cyanocobalamin (B-12) 5000 MCG CAPS Take 5,000 mcg by mouth every evening.      dapagliflozin propanediol (FARXIGA) 10 MG TABS tablet Take 1 tablet (10 mg total) by mouth daily. 90  tablet 3   docusate sodium (COLACE) 100 MG capsule Take 200 mg by mouth every evening.      gabapentin (NEURONTIN) 300 MG capsule TAKE 1 CAPSULE BY MOUTH 3  TIMES DAILY 270 capsule 3   glucose blood test strip Use as instructed check blood sugar two times daily     Insulin Lispro Prot & Lispro (HUMALOG MIX 75/25 KWIKPEN) (75-25) 100 UNIT/ML Kwikpen Inject 30 Units into the skin daily with breakfast. PATIENT USES LILLY CARES PAP 15 mL 11   liraglutide (VICTOZA) 18 MG/3ML SOPN Inject 0.3 mLs (1.8 mg total) into the skin every morning. 9 mL 1   Menthol, Topical Analgesic, 8 % LIQD Apply 1 application topically 3 (three) times daily as needed (for knee pain.).      Methylcellulose, Laxative, 500 MG TABS Take 500 mg by mouth daily. Fiberwell Gummies     MM PEN NEEDLES 31G X 8 MM MISC AS DIRECTED WITH  VICTOZA 100 each 0   Multiple Vitamin (MULTIVITAMIN WITH MINERALS) TABS tablet Take 1 tablet by mouth every evening.      Omega-3-6-9 CAPS Take 1 capsule by mouth every evening. $RemoveBefore'1600mg'nnSGewUrjDzuM$      traZODone (DESYREL) 100 MG tablet TAKE 1 TABLET BY MOUTH AT  BEDTIME 90 tablet 3   trolamine salicylate (ASPERCREME) 10 % cream Apply 1 application topically as needed (knee pain).     vitamin E 400 UNIT capsule Take 400 Units by mouth every evening.      amLODipine (NORVASC)  10 MG tablet Take 1 tablet (10 mg total) by mouth daily. 90 tablet 3   Apple Cider Vinegar 600 MG CAPS Take 600 mg by mouth daily.  (Patient not taking: Reported on 04/02/2021)     diclofenac Sodium (VOLTAREN) 1 % GEL Apply topically 4 (four) times daily. (Patient not taking: Reported on 04/02/2021)     furosemide (LASIX) 20 MG tablet Take 1 tablet (20 mg total) by mouth daily. 90 tablet 3   lisinopril (ZESTRIL) 10 MG tablet Take 1 tablet (10 mg total) by mouth daily. 90 tablet 3   metoprolol succinate (TOPROL-XL) 25 MG 24 hr tablet Take 1/2 tablet by mouth at bedtime 45 tablet 3   omeprazole (PRILOSEC) 20 MG capsule Take 1 capsule (20 mg total)  by mouth daily. 90 capsule 3   rosuvastatin (CRESTOR) 5 MG tablet Take 1 tablet (5 mg total) by mouth daily. 90 tablet 3   sildenafil (VIAGRA) 100 MG tablet Take 1 tablet (100 mg total) by mouth daily as needed. 18 tablet 3   SODIUM BICARBONATE PO Take 1 tablet by mouth 2 (two) times daily.  (Patient not taking: Reported on 03/10/2021)     No current facility-administered medications for this visit.    Allergies: Pioglitazone  Past Medical History:  Diagnosis Date   ALCOHOLISM 12/22/2007   ANEMIA, IRON DEFICIENCY 12/26/2007   Arthritis    OA   Blood transfusion without reported diagnosis    as a child   DIABETES MELLITUS, TYPE II 11/10/2006   ESOPHAGITIS, REFLUX 12/22/2007   GERD (gastroesophageal reflux disease)    HYPERCHOLESTEROLEMIA 12/22/2007   HYPERTENSION 11/10/2006   HYPOGONADISM 12/22/2007   Prob. due to ETOH   HYPOKALEMIA 06/06/2008   Leukopenia    Neuromuscular disorder (HCC)    neuropathy in feet from Diabetes   Neuropathy    Chronic Neuropathy BOTH FEET   Pancytopenia 06/06/2008   RENAL INSUFFICIENCY 11/10/2006   SEES [place] KID[facility] STAGE 2 CKD    Past Surgical History:  Procedure Laterality Date   APPENDECTOMY  AGE 24 OR 10   COLONOSCOPY  08-21-2004/2017   [facility]- normal/2017 [place]   COLONSCOPY  2017   ELECTROCARDIOGRAM  11/07/2006   ESOPHAGOGASTRODUODENOSCOPY  08/20/2004   EYE SURGERY Bilateral 2014   IOC FOR CATARACTS   I & D KNEE WITH POLY EXCHANGE Left 05/16/2019   Procedure: LEFT KNEE IRRIGATION AND DEBRIDEMENT WITH POLY EXCHANGE;  Surgeon: Frederik Pear, MD;  Location: [facility];  Service: Orthopedics;  Laterality: Left;   REPLACEMENT TOTAL KNEE Right 2011   TOTAL KNEE ARTHROPLASTY Left 02/20/2018   Procedure: LEFT TOTAL KNEE ARTHROPLASTY;  Surgeon: Frederik Pear, MD;  Location: [facility];  Service: Orthopedics;  Laterality: Left;   UPPER GASTROINTESTINAL ENDOSCOPY      Family History  Problem Relation Age of Onset   Cancer Mother        Colon Cancer   Colon cancer  Mother 51   Colon polyps Brother    Esophageal cancer Neg Hx    Rectal cancer Neg Hx    Stomach cancer Neg Hx     Social History   Tobacco Use   Smoking status: Never   Smokeless tobacco: Never  Substance Use Topics   Alcohol use: No    Alcohol/week: 0.0 standard drinks    Comment: LAST USE 10 YRS AGO    Subjective:  Presents for yearly CPE; continuing to work with endocrinology for management of diabetes;  No acute concerns today;  Sees eye doctor  and dentist regularly; Continuing with nephrologist every 4-6 months;   Review of Systems  Constitutional: Negative.   HENT: Negative.    Eyes: Negative.   Respiratory: Negative.    Cardiovascular: Negative.   Gastrointestinal: Negative.   Genitourinary: Negative.   Musculoskeletal: Negative.   Skin: Negative.   Neurological: Negative.   Endo/Heme/Allergies: Negative.   Psychiatric/Behavioral: Negative.       Objective:  Vitals:   04/02/21 1053  BP: 124/80  Pulse: 78  Temp: 98.1 F (36.7 C)  TempSrc: Oral  SpO2: 94%  Weight: (!) 303 lb (137.4 kg)  Height: $Remove'5\' 11"'fXlnDRy$  (1.803 m)    General: Well developed, well nourished, in no acute distress  Skin : Warm and dry.  Head: Normocephalic and atraumatic  Eyes: Sclera and conjunctiva clear; pupils round and reactive to light; extraocular movements intact  Ears: External normal; canals clear; tympanic membranes normal  Oropharynx: Pink, supple. No suspicious lesions  Neck: Supple without thyromegaly, adenopathy  Lungs: Respirations unlabored; clear to auscultation bilaterally without wheeze, rales, rhonchi  CVS exam: normal rate and regular rhythm.  Abdomen: Soft; nontender; nondistended; normoactive bowel sounds; no masses or hepatosplenomegaly  Musculoskeletal: No deformities; no active joint inflammation  Extremities: No edema, cyanosis, clubbing  Vessels: Symmetric bilaterally  Neurologic: Alert and oriented; speech intact; face symmetrical; moves all extremities well;  CNII-XII intact without focal deficit  Assessment:  1. PE (physical exam), annual   2. Lipid screening   3. Prostate cancer screening   4. Hypogonadism, male     Plan:  Age appropriate preventive healthcare needs addressed; encouraged regular eye doctor and dental exams; encouraged regular exercise; will update labs and refills as needed today; follow-up to be determined;  This visit occurred during the SARS-CoV-2 public health emergency.  Safety protocols were in place, including screening questions prior to the visit, additional usage of staff PPE, and extensive cleaning of exam room while observing appropriate contact time as indicated for disinfecting solutions.    Return in about 1 year (around 04/02/2022).  Orders Placed This Encounter  Procedures   CBC with Differential/Platelet   Comp Met (CMET)   Lipid panel   PSA    Requested Prescriptions   Signed Prescriptions Disp Refills   amLODipine (NORVASC) 10 MG tablet 90 tablet 3    Sig: Take 1 tablet (10 mg total) by mouth daily.   furosemide (LASIX) 20 MG tablet 90 tablet 3    Sig: Take 1 tablet (20 mg total) by mouth daily.   lisinopril (ZESTRIL) 10 MG tablet 90 tablet 3    Sig: Take 1 tablet (10 mg total) by mouth daily.   metoprolol succinate (TOPROL-XL) 25 MG 24 hr tablet 45 tablet 3    Sig: Take 1/2 tablet by mouth at bedtime   omeprazole (PRILOSEC) 20 MG capsule 90 capsule 3    Sig: Take 1 capsule (20 mg total) by mouth daily.   rosuvastatin (CRESTOR) 5 MG tablet 90 tablet 3    Sig: Take 1 tablet (5 mg total) by mouth daily.   sildenafil (VIAGRA) 100 MG tablet 18 tablet 3    Sig: Take 1 tablet (100 mg total) by mouth daily as needed.

## 2021-04-03 NOTE — Telephone Encounter (Signed)
I have called pt and there was no answer so I left a message to call back.

## 2021-04-07 ENCOUNTER — Other Ambulatory Visit: Payer: Self-pay | Admitting: Family

## 2021-04-07 NOTE — Telephone Encounter (Signed)
Pt is not on the lasix, he is on the Amlodipine, Metoprolol, Lisinopril.

## 2021-04-22 ENCOUNTER — Other Ambulatory Visit: Payer: Self-pay | Admitting: Endocrinology

## 2021-04-24 ENCOUNTER — Telehealth: Payer: Self-pay | Admitting: Endocrinology

## 2021-04-24 ENCOUNTER — Other Ambulatory Visit: Payer: Self-pay

## 2021-04-24 DIAGNOSIS — E1122 Type 2 diabetes mellitus with diabetic chronic kidney disease: Secondary | ICD-10-CM

## 2021-04-24 MED ORDER — VICTOZA 18 MG/3ML ~~LOC~~ SOPN: 1.8000 mg | PEN_INJECTOR | SUBCUTANEOUS | 1 refills | Status: DC

## 2021-04-24 NOTE — Telephone Encounter (Signed)
Rx sent 

## 2021-04-24 NOTE — Telephone Encounter (Signed)
MEDICATION: liraglutide (VICTOZA) 18 MG/3ML SOPN  PHARMACY:   [id number] - [place], [place] - [place]. Phone:  [phone]  Fax:  [phone]      HAS THE PATIENT CONTACTED THEIR PHARMACY?  YES  IS THIS A 90 DAY SUPPLY : 30 DAYS  IS PATIENT OUT OF MEDICATION: YES  IF NOT; HOW MUCH IS LEFT:   LAST APPOINTMENT DATE: @1 /02/2022  NEXT APPOINTMENT DATE:@2 /11/2021  DO WE HAVE YOUR PERMISSION TO LEAVE A DETAILED MESSAGE?:  OTHER COMMENTS: Patient is unable to use coupon without prescription being issued.   **Let patient know to contact pharmacy at the end of the day to make sure medication is ready. **  ** Please notify patient to allow 48-72 hours to process**  **Encourage patient to contact the pharmacy for refills or they can request refills through [facility]**

## 2021-04-27 ENCOUNTER — Encounter: Payer: Self-pay | Admitting: Endocrinology

## 2021-04-30 ENCOUNTER — Other Ambulatory Visit: Payer: Self-pay

## 2021-04-30 ENCOUNTER — Encounter: Payer: Self-pay | Admitting: Endocrinology

## 2021-04-30 DIAGNOSIS — E1122 Type 2 diabetes mellitus with diabetic chronic kidney disease: Secondary | ICD-10-CM

## 2021-04-30 MED ORDER — VICTOZA 18 MG/3ML ~~LOC~~ SOPN: 1.8000 mg | PEN_INJECTOR | SUBCUTANEOUS | 0 refills | Status: DC

## 2021-05-04 ENCOUNTER — Encounter: Payer: Self-pay | Admitting: Endocrinology

## 2021-05-04 DIAGNOSIS — E1122 Type 2 diabetes mellitus with diabetic chronic kidney disease: Secondary | ICD-10-CM

## 2021-05-05 MED ORDER — VICTOZA 18 MG/3ML ~~LOC~~ SOPN: 1.8000 mg | PEN_INJECTOR | SUBCUTANEOUS | 0 refills | Status: DC

## 2021-05-19 ENCOUNTER — Telehealth: Payer: Self-pay

## 2021-05-19 NOTE — Telephone Encounter (Signed)
LVM for pt to let him know that we received 4 boxes of Victozia 18mg /19ml from [employer]. Rx will be placed in the refridg waiting for pick-up.

## 2021-05-20 ENCOUNTER — Ambulatory Visit (INDEPENDENT_AMBULATORY_CARE_PROVIDER_SITE_OTHER): Payer: Medicare Other | Admitting: Endocrinology

## 2021-05-20 ENCOUNTER — Other Ambulatory Visit: Payer: Self-pay

## 2021-05-20 VITALS — BP 132/80 | HR 81 | Ht 71.0 in | Wt 304.8 lb

## 2021-05-20 DIAGNOSIS — N181 Chronic kidney disease, stage 1: Secondary | ICD-10-CM

## 2021-05-20 DIAGNOSIS — E1122 Type 2 diabetes mellitus with diabetic chronic kidney disease: Secondary | ICD-10-CM | POA: Diagnosis not present

## 2021-05-20 LAB — POCT GLYCOSYLATED HEMOGLOBIN (HGB A1C): Hemoglobin A1C: 9.3 % — AB (ref 4.0–5.6)

## 2021-05-20 MED ORDER — FREESTYLE LIBRE 2 SENSOR MISC: 1.0000 | 3 refills | Status: DC

## 2021-05-20 MED ORDER — HUMALOG MIX 50/50 KWIKPEN (50-50) 100 UNIT/ML ~~LOC~~ SUPN: 30.0000 [IU] | PEN_INJECTOR | Freq: Every day | SUBCUTANEOUS | 3 refills | Status: DC

## 2021-05-20 NOTE — Patient Instructions (Addendum)
Please change the insulin to "50/50," 30 units with breakfast, and continue the same other 2 diabetes medications.   I have sent a prescription to a medicare supplier, for the continuous glucose monitor sensors.  check your blood sugar twice a day.  vary the time of day when you check, between before the 3 meals, and at bedtime.  also check if you have symptoms of your blood sugar being too high or too low.  please keep a record of the readings and bring it to your next appointment here (or you can bring the meter itself).  You can write it on any piece of paper.  please call us sooner if your blood sugar goes below 70, or if you have a lot of readings over 200.   Please come back for a follow-up appointment in 2 months.

## 2021-05-20 NOTE — Progress Notes (Signed)
Subjective:    Patient ID: David Lindsey, male    DOB: 09-27-1955, 66 y.o.   MRN: [id number]  HPI Pt returns for f/u of diabetes mellitus:  DM type: Insulin-requiring type 2.   Dx'ed: 7416 Complications: stage 4 CRI, and PN Therapy: insulin since 2019, Victoza, and [place].   DKA: never.   Severe hypoglycemia: never.   Pancreatitis: never.   SDOH: he gets meds from [facility]--pt assistance; he is retired.    Other: he did not tolerate parlodel (tremor); he cannot take pioglitizone (edema); he takes qd insulin, after poor results with multiple daily injections; he is retired. Interval history: no cbg record, but states cbg's varies from 96-200.  It is in general higher as the day goes on.  He takes 30 units QAM.   Past Medical History:  Diagnosis Date   ALCOHOLISM 12/22/2007   ANEMIA, IRON DEFICIENCY 12/26/2007   Arthritis    OA   Blood transfusion without reported diagnosis    as a child   DIABETES MELLITUS, TYPE II 11/10/2006   ESOPHAGITIS, REFLUX 12/22/2007   GERD (gastroesophageal reflux disease)    HYPERCHOLESTEROLEMIA 12/22/2007   HYPERTENSION 11/10/2006   HYPOGONADISM 12/22/2007   Prob. due to ETOH   HYPOKALEMIA 06/06/2008   Leukopenia    Neuromuscular disorder (HCC)    neuropathy in feet from Diabetes   Neuropathy    Chronic Neuropathy BOTH FEET   Pancytopenia 06/06/2008   RENAL INSUFFICIENCY 11/10/2006   SEES [place] KIDNEY STAGE 2 CKD    Past Surgical History:  Procedure Laterality Date   APPENDECTOMY  AGE 23 OR 10   COLONOSCOPY  08-21-2004/2017   [facility]- normal/2017 [place]   COLONSCOPY  2017   ELECTROCARDIOGRAM  11/07/2006   ESOPHAGOGASTRODUODENOSCOPY  08/20/2004   EYE SURGERY Bilateral 2014   IOC FOR CATARACTS   I & D KNEE WITH POLY EXCHANGE Left 05/16/2019   Procedure: LEFT KNEE IRRIGATION AND DEBRIDEMENT WITH POLY EXCHANGE;  Surgeon: Frederik Pear, MD;  Location: [facility];  Service: Orthopedics;  Laterality: Left;   REPLACEMENT TOTAL KNEE Right 2011   TOTAL KNEE  ARTHROPLASTY Left 02/20/2018   Procedure: LEFT TOTAL KNEE ARTHROPLASTY;  Surgeon: Frederik Pear, MD;  Location: [facility];  Service: Orthopedics;  Laterality: Left;   UPPER GASTROINTESTINAL ENDOSCOPY      Social History   Socioeconomic History   Marital status: Married    Spouse name: Not on file   Number of children: Not on file   Years of education: Not on file   Highest education level: Not on file  Occupational History   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  Tobacco Use   Smoking status: Never   Smokeless tobacco: Never  Vaping Use   Vaping Use: Never used  Substance and Sexual Activity   Alcohol use: No    Alcohol/week: 0.0 standard drinks    Comment: LAST USE 10 YRS AGO   Drug use: No   Sexual activity: Not on file  Other Topics Concern   Not on file  Social History Narrative   Not on file   Social Determinants of Health   Financial Resource Strain: Not on file  Food Insecurity: Not on file  Transportation Needs: Not on file  Physical Activity: Not on file  Stress: Not on file  Social Connections: Not on file  Intimate Partner Violence: Not on file    Current Outpatient Medications on File Prior to Visit  Medication Sig Dispense Refill   amLODipine (  NORVASC) 10 MG tablet Take 1 tablet (10 mg total) by mouth daily. 90 tablet 3   Apple Cider Vinegar 600 MG CAPS Take 600 mg by mouth daily.     Ascorbic Acid (VITAMIN C) 1000 MG tablet Take 1,000 mg by mouth every evening.     Biotin w/ Vitamins C & E (HAIR SKIN & NAILS GUMMIES PO) Take 1 tablet by mouth every evening.     Carboxymethylcellul-Glycerin (LUBRICATING EYE DROPS OP) Place 1 drop into both eyes daily as needed (dry eyes).      cholecalciferol (VITAMIN D3) 25 MCG (1000 UNIT) tablet Take 1,000 Units by mouth daily.     clotrimazole-betamethasone (LOTRISONE) cream APPLY  CREAM TOPICALLY THREE TIMES DAILY AS NEEDED FOR  RASH 45 g 0   Coenzyme Q10 (COQ-10) 200 MG CAPS Take 200 mg by mouth every  evening.      Cyanocobalamin (B-12) 5000 MCG CAPS Take 5,000 mcg by mouth every evening.      dapagliflozin propanediol (FARXIGA) 10 MG TABS tablet Take 1 tablet (10 mg total) by mouth daily. 90 tablet 3   diclofenac Sodium (VOLTAREN) 1 % GEL Apply topically 4 (four) times daily.     docusate sodium (COLACE) 100 MG capsule Take 200 mg by mouth every evening.      gabapentin (NEURONTIN) 300 MG capsule TAKE 1 CAPSULE BY MOUTH 3  TIMES DAILY 270 capsule 3   glucose blood test strip Use as instructed check blood sugar two times daily     liraglutide (VICTOZA) 18 MG/3ML SOPN Inject 1.8 mg into the skin every morning. 3 mL 0   lisinopril (ZESTRIL) 10 MG tablet Take 1 tablet (10 mg total) by mouth daily. 90 tablet 3   Menthol, Topical Analgesic, 8 % LIQD Apply 1 application topically 3 (three) times daily as needed (for knee pain.).      Methylcellulose, Laxative, 500 MG TABS Take 500 mg by mouth daily. Fiberwell Gummies     metoprolol succinate (TOPROL-XL) 25 MG 24 hr tablet Take 1/2 tablet by mouth at bedtime 45 tablet 3   MM PEN NEEDLES 31G X 8 MM MISC AS DIRECTED WITH  VICTOZA 100 each 0   Multiple Vitamin (MULTIVITAMIN WITH MINERALS) TABS tablet Take 1 tablet by mouth every evening.      Omega-3-6-9 CAPS Take 1 capsule by mouth every evening. 1600mg      omeprazole (PRILOSEC) 20 MG capsule Take 1 capsule (20 mg total) by mouth daily. 90 capsule 3   rosuvastatin (CRESTOR) 5 MG tablet Take 1 tablet (5 mg total) by mouth daily. 90 tablet 3   sildenafil (VIAGRA) 100 MG tablet Take 1 tablet (100 mg total) by mouth daily as needed. 18 tablet 3   SODIUM BICARBONATE PO Take 1 tablet by mouth 2 (two) times daily.     traZODone (DESYREL) 100 MG tablet TAKE 1 TABLET BY MOUTH AT  BEDTIME 90 tablet 3   trolamine salicylate (ASPERCREME) 10 % cream Apply 1 application topically as needed (knee pain).     vitamin E 400 UNIT capsule Take 400 Units by mouth every evening.      No current facility-administered  medications on file prior to visit.    Review of Systems He denies hypoglycemia    Objective:   Physical Exam   Lab Results  Component Value Date   HGBA1C 9.3 (A) 05/20/2021      Assessment & Plan:  Insulin-requiring type 2 DM: uncontrolled.  The pattern of his cbg's indicates  he needs a faster-acting qam insulin.    Patient Instructions  Please change the insulin to "50/50," 30 units with breakfast, and continue the same other 2 diabetes medications.   I have sent a prescription to a medicare supplier, for the continuous glucose monitor sensors.  check your blood sugar twice a day.  vary the time of day when you check, between before the 3 meals, and at bedtime.  also check if you have symptoms of your blood sugar being too high or too low.  please keep a record of the readings and bring it to your next appointment here (or you can bring the meter itself).  You can write it on any piece of paper.  please call us sooner if your blood sugar goes below 70, or if you have a lot of readings over 200.   Please come back for a follow-up appointment in 2 months.

## 2021-06-03 DIAGNOSIS — N1832 Chronic kidney disease, stage 3b: Secondary | ICD-10-CM | POA: Diagnosis not present

## 2021-06-09 DIAGNOSIS — D631 Anemia in chronic kidney disease: Secondary | ICD-10-CM | POA: Diagnosis not present

## 2021-06-09 DIAGNOSIS — I129 Hypertensive chronic kidney disease with stage 1 through stage 4 chronic kidney disease, or unspecified chronic kidney disease: Secondary | ICD-10-CM | POA: Diagnosis not present

## 2021-06-09 DIAGNOSIS — E872 Acidosis, unspecified: Secondary | ICD-10-CM | POA: Diagnosis not present

## 2021-06-09 DIAGNOSIS — N1832 Chronic kidney disease, stage 3b: Secondary | ICD-10-CM | POA: Diagnosis not present

## 2021-06-09 DIAGNOSIS — R609 Edema, unspecified: Secondary | ICD-10-CM | POA: Diagnosis not present

## 2021-06-09 DIAGNOSIS — E871 Hypo-osmolality and hyponatremia: Secondary | ICD-10-CM | POA: Diagnosis not present

## 2021-06-27 ENCOUNTER — Other Ambulatory Visit: Payer: Self-pay | Admitting: Endocrinology

## 2021-06-27 DIAGNOSIS — E1122 Type 2 diabetes mellitus with diabetic chronic kidney disease: Secondary | ICD-10-CM

## 2021-06-29 ENCOUNTER — Other Ambulatory Visit: Payer: Self-pay | Admitting: Endocrinology

## 2021-07-16 ENCOUNTER — Telehealth: Payer: Self-pay | Admitting: Family

## 2021-07-16 NOTE — Telephone Encounter (Signed)
Left message for patient to call back and schedule Medicare Annual Wellness Visit (AWV).  ? ?Please offer to do virtually or by telephone.  Left office number and my jabber [phone]. ? ?Last AWV: 12/12/19  ? ?Please schedule at anytime with Nurse Health Advisor. ?  ?

## 2021-08-11 ENCOUNTER — Telehealth: Payer: Self-pay

## 2021-08-11 NOTE — Telephone Encounter (Signed)
Attempted to contact the patient to inform him that we have received 4 boxes of Victoza from his patient assistance program. LVM ?

## 2021-08-12 ENCOUNTER — Ambulatory Visit (INDEPENDENT_AMBULATORY_CARE_PROVIDER_SITE_OTHER): Payer: Medicare Other | Admitting: Endocrinology

## 2021-08-12 ENCOUNTER — Encounter: Payer: Self-pay | Admitting: Endocrinology

## 2021-08-12 VITALS — BP 126/82 | HR 89 | Ht 71.0 in | Wt 298.4 lb

## 2021-08-12 DIAGNOSIS — N181 Chronic kidney disease, stage 1: Secondary | ICD-10-CM | POA: Diagnosis not present

## 2021-08-12 DIAGNOSIS — E1122 Type 2 diabetes mellitus with diabetic chronic kidney disease: Secondary | ICD-10-CM | POA: Diagnosis not present

## 2021-08-12 LAB — TSH: TSH: 1.43 u[IU]/mL (ref 0.35–5.50)

## 2021-08-12 LAB — T4, FREE: Free T4: 0.91 ng/dL (ref 0.60–1.60)

## 2021-08-12 NOTE — Progress Notes (Signed)
? ?Subjective:  ? ? Patient ID: David Lindsey, male    DOB: 1955/05/23, 66 y.o.   MRN: [id number] ? ?HPI ?Pt returns for f/u of diabetes mellitus:  ?DM type: Insulin-requiring type 2.   ?Dx'ed: 2013 ?Complications: stage 4 CRI, and PN.   ?Therapy: insulin since 2019, Victoza, and [place].   ?DKA: never.   ?Severe hypoglycemia: never.   ?Pancreatitis: never.   ?SDOH: he gets meds from [facility]--pt assistance; he is retired.   ?Other: he did not tolerate parlodel (tremor); he cannot take pioglitizone (edema); he takes qd insulin, after poor results with multiple daily injections; he is retired.   ?Interval history: no cbg record, but states cbg's varies from 97-190.  It is in general higher as the day goes on.  Pt says [facility] has not yet sent 50/50.   ?Past Medical History:  ?Diagnosis Date  ? ALCOHOLISM 12/22/2007  ? ANEMIA, IRON DEFICIENCY 12/26/2007  ? Arthritis   ? OA  ? Blood transfusion without reported diagnosis   ? as a child  ? DIABETES MELLITUS, TYPE II 11/10/2006  ? ESOPHAGITIS, REFLUX 12/22/2007  ? GERD (gastroesophageal reflux disease)   ? HYPERCHOLESTEROLEMIA 12/22/2007  ? HYPERTENSION 11/10/2006  ? HYPOGONADISM 12/22/2007  ? Prob. due to ETOH  ? HYPOKALEMIA 06/06/2008  ? Leukopenia   ? Neuromuscular disorder ([place])   ? neuropathy in feet from Diabetes  ? Neuropathy   ? Chronic Neuropathy BOTH FEET  ? Pancytopenia 06/06/2008  ? RENAL INSUFFICIENCY 11/10/2006  ? SEES [facility] STAGE 2 CKD  ? ? ?Past Surgical History:  ?Procedure Laterality Date  ? APPENDECTOMY  AGE 59 OR 10  ? COLONOSCOPY  08-21-2004/2017  ? [facility]- normal/2017 Henrene Pastor  ? COLONSCOPY  2017  ? ELECTROCARDIOGRAM  11/07/2006  ? ESOPHAGOGASTRODUODENOSCOPY  08/20/2004  ? EYE SURGERY Bilateral 2014  ? IOC FOR CATARACTS  ? I & D KNEE WITH POLY EXCHANGE Left 05/16/2019  ? Procedure: LEFT KNEE IRRIGATION AND DEBRIDEMENT WITH POLY EXCHANGE;  Surgeon: Frederik Pear, MD;  Location: [facility];  Service: Orthopedics;  Laterality: Left;  ? REPLACEMENT TOTAL KNEE Right 2011  ?  TOTAL KNEE ARTHROPLASTY Left 02/20/2018  ? Procedure: LEFT TOTAL KNEE ARTHROPLASTY;  Surgeon: Frederik Pear, MD;  Location: [facility];  Service: Orthopedics;  Laterality: Left;  ? UPPER GASTROINTESTINAL ENDOSCOPY    ? ? ?Social History  ? ?Socioeconomic History  ? Marital status: Married  ?  Spouse name: Not on file  ? Number of children: Not on file  ? Years of education: Not on file  ? Highest education level: Not on file  ?Occupational History  ? Occupation: Hydrologist  ?  Employer: NOT EMPLOYED  ?Tobacco Use  ? Smoking status: Never  ? Smokeless tobacco: Never  ?Vaping Use  ? Vaping Use: Never used  ?Substance and Sexual Activity  ? Alcohol use: No  ?  Alcohol/week: 0.0 standard drinks  ?  Comment: LAST USE 10 YRS AGO  ? Drug use: No  ? Sexual activity: Not on file  ?Other Topics Concern  ? Not on file  ?Social History Narrative  ? Not on file  ? ?Social Determinants of Health  ? ?Financial Resource Strain: Low Risk   ? Difficulty of Paying Living Expenses: Not hard at all  ?Food Insecurity: No Food Insecurity  ? Worried About Charity fundraiser in the Last Year: Never true  ? Ran Out of Food in the Last Year: Never true  ?Transportation Needs: No Transportation Needs  ?  Lack of Transportation (Medical): No  ? Lack of Transportation (Non-Medical): No  ?Physical Activity: Not on file  ?Stress: No Stress Concern Present  ? Feeling of Stress : Not at all  ?Social Connections: Moderately Integrated  ? Frequency of Communication with Friends and Family: More than three times a week  ? Frequency of Social Gatherings with Friends and Family: More than three times a week  ? Attends Religious Services: More than 4 times per year  ? Active Member of Clubs or Organizations: No  ? Attends Archivist Meetings: Never  ? Marital Status: Married  ?Intimate Partner Violence: Not At Risk  ? Fear of Current or Ex-Partner: No  ? Emotionally Abused: No  ? Physically Abused: No  ? Sexually Abused: No  ? ? ?Current Outpatient  Medications on File Prior to Visit  ?Medication Sig Dispense Refill  ? amLODipine (NORVASC) 10 MG tablet Take 1 tablet (10 mg total) by mouth daily. 90 tablet 3  ? Apple Cider Vinegar 600 MG CAPS Take 600 mg by mouth daily.    ? Ascorbic Acid (VITAMIN C) 1000 MG tablet Take 1,000 mg by mouth every evening.    ? Biotin w/ Vitamins C & E (HAIR SKIN & NAILS GUMMIES PO) Take 1 tablet by mouth every evening.    ? Carboxymethylcellul-Glycerin (LUBRICATING EYE DROPS OP) Place 1 drop into both eyes daily as needed (dry eyes).     ? cholecalciferol (VITAMIN D3) 25 MCG (1000 UNIT) tablet Take 1,000 Units by mouth daily.    ? clotrimazole-betamethasone (LOTRISONE) cream APPLY  CREAM TOPICALLY TO AFFECTED AREA THREE TIMES DAILY AS NEEDED FOR  RASH 45 g 0  ? Coenzyme Q10 (COQ-10) 200 MG CAPS Take 200 mg by mouth every evening.     ? Continuous Blood Gluc Sensor (FREESTYLE LIBRE 2 SENSOR) MISC 1 Device by Does not apply route every 14 (fourteen) days. 6 each 3  ? Cyanocobalamin (B-12) 5000 MCG CAPS Take 5,000 mcg by mouth every evening.     ? dapagliflozin propanediol (FARXIGA) 10 MG TABS tablet Take 1 tablet (10 mg total) by mouth daily. 90 tablet 3  ? diclofenac Sodium (VOLTAREN) 1 % GEL Apply topically 4 (four) times daily.    ? docusate sodium (COLACE) 100 MG capsule Take 200 mg by mouth every evening.     ? gabapentin (NEURONTIN) 300 MG capsule TAKE 1 CAPSULE BY MOUTH 3  TIMES DAILY 270 capsule 3  ? glucose blood test strip Use as instructed check blood sugar two times daily    ? Insulin Lispro Prot & Lispro (HUMALOG MIX 50/50 KWIKPEN) (50-50) 100 UNIT/ML Kwikpen Inject 30 Units into the skin daily with breakfast. And pen needles 1/day 45 mL 3  ? liraglutide (VICTOZA) 18 MG/3ML SOPN Inject 1.8 mg into the skin every morning. 3 mL 0  ? lisinopril (ZESTRIL) 10 MG tablet Take 1 tablet (10 mg total) by mouth daily. 90 tablet 3  ? Menthol, Topical Analgesic, 8 % LIQD Apply 1 application topically 3 (three) times daily as needed  (for knee pain.).     ? Methylcellulose, Laxative, 500 MG TABS Take 500 mg by mouth daily. Fiberwell Gummies    ? metoprolol succinate (TOPROL-XL) 25 MG 24 hr tablet Take 1/2 tablet by mouth at bedtime 45 tablet 3  ? MM PEN NEEDLES 31G X 8 MM MISC AS DIRECTED WITH  VICTOZA 100 each 0  ? Multiple Vitamin (MULTIVITAMIN WITH MINERALS) TABS tablet Take 1 tablet by mouth  every evening.     ? Omega-3-6-9 CAPS Take 1 capsule by mouth every evening. '1600mg'$     ? omeprazole (PRILOSEC) 20 MG capsule Take 1 capsule (20 mg total) by mouth daily. 90 capsule 3  ? rosuvastatin (CRESTOR) 5 MG tablet Take 1 tablet (5 mg total) by mouth daily. 90 tablet 3  ? sildenafil (VIAGRA) 100 MG tablet Take 1 tablet (100 mg total) by mouth daily as needed. 18 tablet 3  ? SODIUM BICARBONATE PO Take 1 tablet by mouth 2 (two) times daily.    ? traZODone (DESYREL) 100 MG tablet TAKE 1 TABLET BY MOUTH AT  BEDTIME 90 tablet 3  ? trolamine salicylate (ASPERCREME) 10 % cream Apply 1 application topically as needed (knee pain).    ? vitamin E 400 UNIT capsule Take 400 Units by mouth every evening.     ? ?No current facility-administered medications on file prior to visit.  ? ? ?Allergies  ?Allergen Reactions  ? Pioglitazone Swelling  ? ? ?Family History  ?Problem Relation Age of Onset  ? Cancer Mother   ?     Colon Cancer  ? Colon cancer Mother 15  ? Colon polyps Brother   ? Esophageal cancer Neg Hx   ? Rectal cancer Neg Hx   ? Stomach cancer Neg Hx   ? ? ?BP 126/82 (BP Location: Left Arm, Patient Position: Sitting, Cuff Size: Normal)   Pulse 89   Ht '5\' 11"'$  (1.803 m)   Wt 298 lb 6.4 oz (135.4 kg)   SpO2 97%   BMI 41.62 kg/m?  ? ? ?Review of Systems ?He denies hypoglycemia ?   ?Objective:  ? Physical Exam ?VITAL SIGNS:  See vs page ?GENERAL: no distress ? ? ?A1c=8.2% ?   ?Assessment & Plan:  ?Insulin-requiring type 2 DM: uncontrolled.  The pattern of his cbg's indicates he needs a faster-acting qam insulin.  ? ?Patient Instructions  ?Please change  the insulin to "50/50," 30 units with breakfast, and continue the same other 2 diabetes medications.   ?check your blood sugar twice a day.  vary the time of day when you check, between before the 3 meals,

## 2021-08-12 NOTE — Patient Instructions (Addendum)
Please change the insulin to "50/50," 30 units with breakfast, and continue the same other 2 diabetes medications.   ?check your blood sugar twice a day.  vary the time of day when you check, between before the 3 meals, and at bedtime.  also check if you have symptoms of your blood sugar being too high or too low.  please keep a record of the readings and bring it to your next appointment here (or you can bring the meter itself).  You can write it on any piece of paper.  please call us sooner if your blood sugar goes below 70, or if you have a lot of readings over 200.   ?You should have an endocrinology follow-up appointment in 3 months.   ?

## 2021-08-14 ENCOUNTER — Ambulatory Visit (INDEPENDENT_AMBULATORY_CARE_PROVIDER_SITE_OTHER): Payer: Medicare Other

## 2021-08-14 VITALS — BP 152/87 | HR 81 | Temp 98.3°F | Resp 16 | Ht 69.0 in | Wt 301.8 lb

## 2021-08-14 DIAGNOSIS — Z0000 Encounter for general adult medical examination without abnormal findings: Secondary | ICD-10-CM

## 2021-08-14 NOTE — Patient Instructions (Signed)
David Lindsey , ?Thank you for taking time to come for your Medicare Wellness Visit. I appreciate your ongoing commitment to your health goals. Please review the following plan we discussed and let me know if I can assist you in the future.  ? ?Screening recommendations/referrals: ?Colonoscopy: 06/25/20 due 06/25/25 ?Recommended yearly ophthalmology/optometry visit for glaucoma screening and checkup ?Recommended yearly dental visit for hygiene and checkup ? ?Vaccinations: ?Influenza vaccine: up to date ?Pneumococcal vaccine: up to date ?Tdap vaccine: up to date ?Shingles vaccine: up to date   ?Covid-19: completed ? ?Advanced directives: no ? ?Conditions/risks identified: see problem list  ? ?Next appointment: Follow up in one year for your annual wellness visit.  ? ?Preventive Care 1 Years and Older, Male ?Preventive care refers to lifestyle choices and visits with your health care provider that can promote health and wellness. ?What does preventive care include? ?A yearly physical exam. This is also called an annual well check. ?Dental exams once or twice a year. ?Routine eye exams. Ask your health care provider how often you should have your eyes checked. ?Personal lifestyle choices, including: ?Daily care of your teeth and gums. ?Regular physical activity. ?Eating a healthy diet. ?Avoiding tobacco and drug use. ?Limiting alcohol use. ?Practicing safe sex. ?Taking low doses of aspirin every day. ?Taking vitamin and mineral supplements as recommended by your health care provider. ?What happens during an annual well check? ?The services and screenings done by your health care provider during your annual well check will depend on your age, overall health, lifestyle risk factors, and family history of disease. ?Counseling  ?Your health care provider may ask you questions about your: ?Alcohol use. ?Tobacco use. ?Drug use. ?Emotional well-being. ?Home and relationship well-being. ?Sexual activity. ?Eating habits. ?History  of falls. ?Memory and ability to understand (cognition). ?Work and work Statistician. ?Screening  ?You may have the following tests or measurements: ?Height, weight, and BMI. ?Blood pressure. ?Lipid and cholesterol levels. These may be checked every 5 years, or more frequently if you are over 77 years old. ?Skin check. ?Lung cancer screening. You may have this screening every year starting at age 52 if you have a 30-pack-year history of smoking and currently smoke or have quit within the past 15 years. ?Fecal occult blood test (FOBT) of the stool. You may have this test every year starting at age 69. ?Flexible sigmoidoscopy or colonoscopy. You may have a sigmoidoscopy every 5 years or a colonoscopy every 10 years starting at age 11. ?Prostate cancer screening. Recommendations will vary depending on your family history and other risks. ?Hepatitis C blood test. ?Hepatitis B blood test. ?Sexually transmitted disease (STD) testing. ?Diabetes screening. This is done by checking your blood sugar (glucose) after you have not eaten for a while (fasting). You may have this done every 1-3 years. ?Abdominal aortic aneurysm (AAA) screening. You may need this if you are a current or former smoker. ?Osteoporosis. You may be screened starting at age 73 if you are at high risk. ?Talk with your health care provider about your test results, treatment options, and if necessary, the need for more tests. ?Vaccines  ?Your health care provider may recommend certain vaccines, such as: ?Influenza vaccine. This is recommended every year. ?Tetanus, diphtheria, and acellular pertussis (Tdap, Td) vaccine. You may need a Td booster every 10 years. ?Zoster vaccine. You may need this after age 71. ?Pneumococcal 13-valent conjugate (PCV13) vaccine. One dose is recommended after age 54. ?Pneumococcal polysaccharide (PPSV23) vaccine. One dose is recommended after age  26. ?Talk to your health care provider about which screenings and vaccines you need  and how often you need them. ?This information is not intended to replace advice given to you by your health care provider. Make sure you discuss any questions you have with your health care provider. ?Document Released: 04/25/2015 Document Revised: 12/17/2015 Document Reviewed: 01/28/2015 ?Elsevier Interactive Patient Education ? 2017 [place]. ? ?Fall Prevention in the Home ?Falls can cause injuries. They can happen to people of all ages. There are many things you can do to make your home safe and to help prevent falls. ?What can I do on the outside of my home? ?Regularly fix the edges of walkways and driveways and fix any cracks. ?Remove anything that might make you trip as you walk through a door, such as a raised step or threshold. ?Trim any bushes or trees on the path to your home. ?Use bright outdoor lighting. ?Clear any walking paths of anything that might make someone trip, such as rocks or tools. ?Regularly check to see if handrails are loose or broken. Make sure that both sides of any steps have handrails. ?Any raised decks and porches should have guardrails on the edges. ?Have any leaves, snow, or ice cleared regularly. ?Use sand or salt on walking paths during winter. ?Clean up any spills in your garage right away. This includes oil or grease spills. ?What can I do in the bathroom? ?Use night lights. ?Install grab bars by the toilet and in the tub and shower. Do not use towel bars as grab bars. ?Use non-skid mats or decals in the tub or shower. ?If you need to sit down in the shower, use a plastic, non-slip stool. ?Keep the floor dry. Clean up any water that spills on the floor as soon as it happens. ?Remove soap buildup in the tub or shower regularly. ?Attach bath mats securely with double-sided non-slip rug tape. ?Do not have throw rugs and other things on the floor that can make you trip. ?What can I do in the bedroom? ?Use night lights. ?Make sure that you have a light by your bed that is easy  to reach. ?Do not use any sheets or blankets that are too big for your bed. They should not hang down onto the floor. ?Have a firm chair that has side arms. You can use this for support while you get dressed. ?Do not have throw rugs and other things on the floor that can make you trip. ?What can I do in the kitchen? ?Clean up any spills right away. ?Avoid walking on wet floors. ?Keep items that you use a lot in easy-to-reach places. ?If you need to reach something above you, use a strong step stool that has a grab bar. ?Keep electrical cords out of the way. ?Do not use floor polish or wax that makes floors slippery. If you must use wax, use non-skid floor wax. ?Do not have throw rugs and other things on the floor that can make you trip. ?What can I do with my stairs? ?Do not leave any items on the stairs. ?Make sure that there are handrails on both sides of the stairs and use them. Fix handrails that are broken or loose. Make sure that handrails are as long as the stairways. ?Check any carpeting to make sure that it is firmly attached to the stairs. Fix any carpet that is loose or worn. ?Avoid having throw rugs at the top or bottom of the stairs. If you do have  throw rugs, attach them to the floor with carpet tape. ?Make sure that you have a light switch at the top of the stairs and the bottom of the stairs. If you do not have them, ask someone to add them for you. ?What else can I do to help prevent falls? ?Wear shoes that: ?Do not have high heels. ?Have rubber bottoms. ?Are comfortable and fit you well. ?Are closed at the toe. Do not wear sandals. ?If you use a stepladder: ?Make sure that it is fully opened. Do not climb a closed stepladder. ?Make sure that both sides of the stepladder are locked into place. ?Ask someone to hold it for you, if possible. ?Clearly mark and make sure that you can see: ?Any grab bars or handrails. ?First and last steps. ?Where the edge of each step is. ?Use tools that help you move  around (mobility aids) if they are needed. These include: ?Canes. ?Walkers. ?Scooters. ?Crutches. ?Turn on the lights when you go into a dark area. Replace any light bulbs as soon as they burn out. ?Set u

## 2021-08-14 NOTE — Progress Notes (Addendum)
? ?Subjective:  ? David Lindsey is a 66 y.o. male who presents for an Initial Medicare Annual Wellness Visit. ? ?Review of Systems    ? ?Cardiac Risk Factors include: advanced age (>67mn, >>6women);diabetes mellitus;hypertension;dyslipidemia ? ?   ?Objective:  ?  ?Today's Vitals  ? 08/14/21 1051  ?BP: (!) 152/87  ?Pulse: 81  ?Resp: 16  ?Temp: 98.3 ?F (36.8 ?C)  ?SpO2: 98%  ?Weight: (!) 301 lb 12.8 oz (136.9 kg)  ?Height: '5\' 9"'$  (1.753 m)  ? ?Body mass index is 44.57 kg/m?. ? ? ?  08/14/2021  ? 10:58 AM 04/02/2021  ? 11:30 AM 05/16/2019  ? 10:58 AM 05/15/2019  ?  1:33 PM 05/07/2019  ?  4:59 PM 02/20/2018  ? 10:53 AM 02/20/2018  ?  6:32 AM  ?Advanced Directives  ?Does Patient Have a Medical Advance Directive? No No No No No No No  ?Would patient like information on creating a medical advance directive? No - Patient declined Yes (MAU/Ambulatory/Procedural Areas - Information given) No - Patient declined No - Patient declined Yes (ED - Information included in AVS) No - Patient declined No - Patient declined  ? ? ?Current Medications (verified) ?Outpatient Encounter Medications as of 08/14/2021  ?Medication Sig  ? amLODipine (NORVASC) 10 MG tablet Take 1 tablet (10 mg total) by mouth daily.  ? Apple Cider Vinegar 600 MG CAPS Take 600 mg by mouth daily.  ? Ascorbic Acid (VITAMIN C) 1000 MG tablet Take 1,000 mg by mouth every evening.  ? Biotin w/ Vitamins C & E (HAIR SKIN & NAILS GUMMIES PO) Take 1 tablet by mouth every evening.  ? Carboxymethylcellul-Glycerin (LUBRICATING EYE DROPS OP) Place 1 drop into both eyes daily as needed (dry eyes).   ? cholecalciferol (VITAMIN D3) 25 MCG (1000 UNIT) tablet Take 1,000 Units by mouth daily.  ? clotrimazole-betamethasone (LOTRISONE) cream APPLY  CREAM TOPICALLY TO AFFECTED AREA THREE TIMES DAILY AS NEEDED FOR  RASH  ? Coenzyme Q10 (COQ-10) 200 MG CAPS Take 200 mg by mouth every evening.   ? Continuous Blood Gluc Sensor (FREESTYLE LIBRE 2 SENSOR) MISC 1 Device by Does not apply route  every 14 (fourteen) days.  ? Cyanocobalamin (B-12) 5000 MCG CAPS Take 5,000 mcg by mouth every evening.   ? dapagliflozin propanediol (FARXIGA) 10 MG TABS tablet Take 1 tablet (10 mg total) by mouth daily.  ? diclofenac Sodium (VOLTAREN) 1 % GEL Apply topically 4 (four) times daily.  ? docusate sodium (COLACE) 100 MG capsule Take 200 mg by mouth every evening.   ? gabapentin (NEURONTIN) 300 MG capsule TAKE 1 CAPSULE BY MOUTH 3  TIMES DAILY  ? glucose blood test strip Use as instructed check blood sugar two times daily  ? Insulin Lispro Prot & Lispro (HUMALOG MIX 50/50 KWIKPEN) (50-50) 100 UNIT/ML Kwikpen Inject 30 Units into the skin daily with breakfast. And pen needles 1/day  ? liraglutide (VICTOZA) 18 MG/3ML SOPN Inject 1.8 mg into the skin every morning.  ? lisinopril (ZESTRIL) 10 MG tablet Take 1 tablet (10 mg total) by mouth daily.  ? Menthol, Topical Analgesic, 8 % LIQD Apply 1 application topically 3 (three) times daily as needed (for knee pain.).   ? Methylcellulose, Laxative, 500 MG TABS Take 500 mg by mouth daily. Fiberwell Gummies  ? metoprolol succinate (TOPROL-XL) 25 MG 24 hr tablet Take 1/2 tablet by mouth at bedtime  ? MM PEN NEEDLES 31G X 8 MM MISC AS DIRECTED WITH  VICTOZA  ? Multiple Vitamin (  MULTIVITAMIN WITH MINERALS) TABS tablet Take 1 tablet by mouth every evening.   ? Omega-3-6-9 CAPS Take 1 capsule by mouth every evening. '1600mg'$   ? omeprazole (PRILOSEC) 20 MG capsule Take 1 capsule (20 mg total) by mouth daily.  ? rosuvastatin (CRESTOR) 5 MG tablet Take 1 tablet (5 mg total) by mouth daily.  ? sildenafil (VIAGRA) 100 MG tablet Take 1 tablet (100 mg total) by mouth daily as needed.  ? SODIUM BICARBONATE PO Take 1 tablet by mouth 2 (two) times daily.  ? traZODone (DESYREL) 100 MG tablet TAKE 1 TABLET BY MOUTH AT  BEDTIME  ? trolamine salicylate (ASPERCREME) 10 % cream Apply 1 application topically as needed (knee pain).  ? vitamin E 400 UNIT capsule Take 400 Units by mouth every evening.    ? ?No facility-administered encounter medications on file as of 08/14/2021.  ? ? ?Allergies (verified) ?Pioglitazone  ? ?History: ?Past Medical History:  ?Diagnosis Date  ? ALCOHOLISM 12/22/2007  ? ANEMIA, IRON DEFICIENCY 12/26/2007  ? Arthritis   ? OA  ? Blood transfusion without reported diagnosis   ? as a child  ? DIABETES MELLITUS, TYPE II 11/10/2006  ? ESOPHAGITIS, REFLUX 12/22/2007  ? GERD (gastroesophageal reflux disease)   ? HYPERCHOLESTEROLEMIA 12/22/2007  ? HYPERTENSION 11/10/2006  ? HYPOGONADISM 12/22/2007  ? Prob. due to ETOH  ? HYPOKALEMIA 06/06/2008  ? Leukopenia   ? Neuromuscular disorder ([place])   ? neuropathy in feet from Diabetes  ? Neuropathy   ? Chronic Neuropathy BOTH FEET  ? Pancytopenia 06/06/2008  ? RENAL INSUFFICIENCY 11/10/2006  ? SEES [place] [facility] STAGE 2 CKD  ? ?Past Surgical History:  ?Procedure Laterality Date  ? APPENDECTOMY  AGE 48 OR 10  ? COLONOSCOPY  08-21-2004/2017  ? [facility]/2017 [facility]  ? COLONSCOPY  2017  ? ELECTROCARDIOGRAM  11/07/2006  ? ESOPHAGOGASTRODUODENOSCOPY  08/20/2004  ? EYE SURGERY Bilateral 2014  ? IOC FOR CATARACTS  ? I & D KNEE WITH POLY EXCHANGE Left 05/16/2019  ? Procedure: LEFT KNEE IRRIGATION AND DEBRIDEMENT WITH POLY EXCHANGE;  Surgeon: Frederik Pear, MD;  Location: [facility];  Service: Orthopedics;  Laterality: Left;  ? REPLACEMENT TOTAL KNEE Right 2011  ? TOTAL KNEE ARTHROPLASTY Left 02/20/2018  ? Procedure: LEFT TOTAL KNEE ARTHROPLASTY;  Surgeon: Frederik Pear, MD;  Location: [facility];  Service: Orthopedics;  Laterality: Left;  ? UPPER GASTROINTESTINAL ENDOSCOPY    ? ?Family History  ?Problem Relation Age of Onset  ? Cancer Mother   ?     Colon Cancer  ? Colon cancer Mother 29  ? Colon polyps Brother   ? Esophageal cancer Neg Hx   ? Rectal cancer Neg Hx   ? Stomach cancer Neg Hx   ? ?Social History  ? ?Socioeconomic History  ? Marital status: Married  ?  Spouse name: Not on file  ? Number of children: Not on file  ? Years of education: Not on file  ? Highest education  level: Not on file  ?Occupational History  ? Occupation: Hydrologist  ?  Employer: NOT EMPLOYED  ?Tobacco Use  ? Smoking status: Never  ? Smokeless tobacco: Never  ?Vaping Use  ? Vaping Use: Never used  ?Substance and Sexual Activity  ? Alcohol use: No  ?  Alcohol/week: 0.0 standard drinks  ?  Comment: LAST USE 10 YRS AGO  ? Drug use: No  ? Sexual activity: Not on file  ?Other Topics Concern  ? Not on file  ?Social History Narrative  ?  Not on file  ? ?Social Determinants of Health  ? ?Financial Resource Strain: Low Risk   ? Difficulty of Paying Living Expenses: Not hard at all  ?Food Insecurity: No Food Insecurity  ? Worried About Charity fundraiser in the Last Year: Never true  ? Ran Out of Food in the Last Year: Never true  ?Transportation Needs: No Transportation Needs  ? Lack of Transportation (Medical): No  ? Lack of Transportation (Non-Medical): No  ?Physical Activity: Not on file  ?Stress: No Stress Concern Present  ? Feeling of Stress : Not at all  ?Social Connections: Moderately Integrated  ? Frequency of Communication with Friends and Family: More than three times a week  ? Frequency of Social Gatherings with Friends and Family: More than three times a week  ? Attends Religious Services: More than 4 times per year  ? Active Member of Clubs or Organizations: No  ? Attends Archivist Meetings: Never  ? Marital Status: Married  ? ? ?Tobacco Counseling ?Counseling given: Not Answered ? ? ?Clinical Intake: ? ?Pre-visit preparation completed: Yes ? ?Pain : No/denies pain ? ?  ? ?Nutritional Risks: None ?Diabetes: Yes ?CBG done?: No ?Did pt. bring in CBG monitor from home?: No ? ?How often do you need to have someone help you when you read instructions, pamphlets, or other written materials from your doctor or pharmacy?: 1 - Never ? ?Diabetic?Yes ?Nutrition Risk Assessment: ? ?Has the patient had any N/V/D within the last 2 months?  No  ?Does the patient have any non-healing wounds?  No  ?Has the  patient had any unintentional weight loss or weight gain?  No  ? ?Diabetes: ? ?Is the patient diabetic?  Yes  ?If diabetic, was a CBG obtained today?  No  ?Did the patient bring in their glucometer from home?  No  ?H

## 2021-09-02 ENCOUNTER — Other Ambulatory Visit: Payer: Self-pay | Admitting: Endocrinology

## 2021-09-03 ENCOUNTER — Telehealth: Payer: Self-pay | Admitting: Family

## 2021-09-03 LAB — HEMOGLOBIN A1C: Hemoglobin A1C: 7.7

## 2021-09-03 LAB — HM DIABETES FOOT EXAM: HM Diabetic Foot Exam: NORMAL

## 2021-09-03 NOTE — Telephone Encounter (Signed)
Proformed [facility]aflo- results are = left TAB.  Full results will be mailed  Health Call [facility]-   [phone]

## 2021-09-04 NOTE — Telephone Encounter (Signed)
Called the number Lvm to office back

## 2021-09-24 ENCOUNTER — Encounter: Payer: Self-pay | Admitting: *Deleted

## 2021-11-02 ENCOUNTER — Other Ambulatory Visit: Payer: Self-pay | Admitting: Endocrinology

## 2021-11-06 ENCOUNTER — Other Ambulatory Visit (INDEPENDENT_AMBULATORY_CARE_PROVIDER_SITE_OTHER): Payer: Medicare Other

## 2021-11-06 ENCOUNTER — Other Ambulatory Visit: Payer: Self-pay | Admitting: Endocrinology

## 2021-11-06 DIAGNOSIS — E1122 Type 2 diabetes mellitus with diabetic chronic kidney disease: Secondary | ICD-10-CM | POA: Diagnosis not present

## 2021-11-06 DIAGNOSIS — N181 Chronic kidney disease, stage 1: Secondary | ICD-10-CM | POA: Diagnosis not present

## 2021-11-06 LAB — BASIC METABOLIC PANEL
BUN: 20 mg/dL (ref 6–23)
CO2: 26 mEq/L (ref 19–32)
Calcium: 9.2 mg/dL (ref 8.4–10.5)
Chloride: 100 mEq/L (ref 96–112)
Creatinine, Ser: 1.92 mg/dL — ABNORMAL HIGH (ref 0.40–1.50)
GFR: 35.52 mL/min — ABNORMAL LOW (ref >60.00)
Glucose, Bld: 178 mg/dL — ABNORMAL HIGH (ref 70–99)
Potassium: 4.5 mEq/L (ref 3.5–5.1)
Sodium: 133 mEq/L — ABNORMAL LOW (ref 135–145)

## 2021-11-06 LAB — MICROALBUMIN / CREATININE URINE RATIO
Creatinine,U: 63.1 mg/dL
Microalb Creat Ratio: 5.5 mg/g (ref 0.0–30.0)
Microalb, Ur: 3.5 mg/dL — ABNORMAL HIGH (ref 0.0–1.9)

## 2021-11-07 LAB — FRUCTOSAMINE: Fructosamine: 356 umol/L — ABNORMAL HIGH (ref 0–285)

## 2021-11-10 NOTE — Progress Notes (Unsigned)
Patient ID: David Lindsey, male   DOB: 21-Mar-1956, 66 y.o.   MRN: [id number]           Reason for Appointment: Type II Diabetes follow-up   History of Present Illness   Diagnosis date: 2013  Previous history:  Non-insulin hypoglycemic drugs previously used: Metformin, Invokana, Amaryl, Prandin Metformin stopped in 2019 Farxiga started in 2021 replacing Invokana Insulin was started in 5/20, generally has been on premixed insulin  A1c range in the last few years is: 6.4-9.7  Recent history:     Non-insulin hypoglycemic drugs: Farxiga 10 mg daily, Victoza 1.8 mg daily     Insulin regimen: Humalog mix 30 units in a.m.           Side effects from medications: Actos: Edema ?  Current self management, blood sugar patterns and problems identified:  A1c is somewhat better at 7.7 He is very inconsistent with checking his blood sugars and not clear how often he does them; also not clear if his test strips are expired He says that he only occasionally will have a feeling of low blood sugar midday and lowest sugar he remembers is 85 Also unclear whether he has high readings either fasting or after dinner However blood sugar late morning in the lab was 178 Prescription was sent to [facility] for freestyle libre sensor but not clear why he has not obtained this, he is not much aware of the importance of continuous glucose monitoring Although he eats mostly oatmeal during winter months he is only eating fruits without any protein at breakfast Does not follow any meal plan and has not been having any diabetes education since diagnosis He gets all his medications through patient assistance program as he cannot afford any  Exercise: none  Diet management: Bfst: fruit     Hypoglycemia: Rarely  Glucometer: One Touch.           Blood Glucose readings from recall  PRE-MEAL Fasting Lunch Dinner Bedtime Overall  Glucose range: 120 ? 83   85-190  Mean/median:        POST-MEAL PC Breakfast PC  Lunch PC Dinner  Glucose range:     Mean/median:       Dietician visit: Most recent: 2000     Weight control:  Wt Readings from Last 3 Encounters:  11/11/21 295 lb 12.8 oz (134.2 kg)  08/14/21 (!) 301 lb 12.8 oz (136.9 kg)  08/12/21 298 lb 6.4 oz (135.4 kg)            Diabetes labs:  Lab Results  Component Value Date   HGBA1C 7.7 09/03/2021   HGBA1C 9.3 (A) 05/20/2021   HGBA1C 8.8 (A) 02/06/2021   Lab Results  Component Value Date   MICROALBUR 3.5 (H) 11/06/2021   LDLCALC 81 04/02/2021   CREATININE 1.92 (H) 11/06/2021    Lab Results  Component Value Date   FRUCTOSAMINE 356 (H) 11/06/2021   FRUCTOSAMINE 348 (H) 12/29/2017   Lab Results  Component Value Date   HGB 14.1 04/02/2021      Allergies as of 11/11/2021       Reactions   Pioglitazone Swelling        Medication List        Accurate as of November 11, 2021 10:00 AM. If you have any questions, ask your nurse or doctor.          amLODipine 10 MG tablet Commonly known as: NORVASC Take 1 tablet (10 mg total) by mouth daily.  Apple Cider Vinegar 600 MG Caps Take 600 mg by mouth daily.   B-12 5000 MCG Caps Take 5,000 mcg by mouth every evening.   cholecalciferol 25 MCG (1000 UNIT) tablet Commonly known as: VITAMIN D3 Take 1,000 Units by mouth daily.   clotrimazole-betamethasone cream Commonly known as: LOTRISONE APPLY  CREAM TOPICALLY TO AFFECTED AREA THREE TIMES DAILY AS NEEDED FOR  RASH   CoQ-10 200 MG Caps Take 200 mg by mouth every evening.   dapagliflozin propanediol 10 MG Tabs tablet Commonly known as: Farxiga Take 1 tablet (10 mg total) by mouth daily.   diclofenac Sodium 1 % Gel Commonly known as: VOLTAREN Apply topically 4 (four) times daily.   docusate sodium 100 MG capsule Commonly known as: COLACE Take 200 mg by mouth every evening.   FreeStyle Libre 2 Sensor Misc 1 Device by Does not apply route every 14 (fourteen) days.   gabapentin 300 MG capsule Commonly known  as: NEURONTIN TAKE 1 CAPSULE BY MOUTH 3  TIMES DAILY   glucose blood test strip Use as instructed check blood sugar two times daily   HAIR SKIN & NAILS GUMMIES PO Take 1 tablet by mouth every evening.   HumaLOG Mix 50/50 KwikPen (50-50) 100 UNIT/ML Kwikpen Generic drug: Insulin Lispro Prot & Lispro Inject 30 Units into the skin daily with breakfast. And pen needles 1/day   lisinopril 10 MG tablet Commonly known as: ZESTRIL Take 1 tablet (10 mg total) by mouth daily.   LUBRICATING EYE DROPS OP Place 1 drop into both eyes daily as needed (dry eyes).   Menthol (Topical Analgesic) 8 % Liqd Apply 1 application topically 3 (three) times daily as needed (for knee pain.).   Methylcellulose (Laxative) 500 MG Tabs Take 500 mg by mouth daily. Fiberwell Gummies   metoprolol succinate 25 MG 24 hr tablet Commonly known as: TOPROL-XL Take 1/2 tablet by mouth at bedtime   MM Pen Needles 31G X 8 MM Misc Generic drug: Insulin Pen Needle AS DIRECTED WITH  VICTOZA   multivitamin with minerals Tabs tablet Take 1 tablet by mouth every evening.   Omega-3-6-9 Caps Take 1 capsule by mouth every evening. '1600mg'$    omeprazole 20 MG capsule Commonly known as: PRILOSEC Take 1 capsule (20 mg total) by mouth daily.   rosuvastatin 5 MG tablet Commonly known as: CRESTOR Take 1 tablet (5 mg total) by mouth daily.   sildenafil 100 MG tablet Commonly known as: VIAGRA Take 1 tablet (100 mg total) by mouth daily as needed.   SODIUM BICARBONATE PO Take 1 tablet by mouth 2 (two) times daily.   traZODone 100 MG tablet Commonly known as: DESYREL TAKE 1 TABLET BY MOUTH AT  BEDTIME   trolamine salicylate 10 % cream Commonly known as: ASPERCREME Apply 1 application topically as needed (knee pain).   Victoza 18 MG/3ML Sopn Generic drug: liraglutide Inject 1.8 mg into the skin every morning.   vitamin C 1000 MG tablet Take 1,000 mg by mouth every evening.   vitamin E 180 MG (400 UNITS)  capsule Take 400 Units by mouth every evening.        Allergies:  Allergies  Allergen Reactions   Pioglitazone Swelling    Past Medical History:  Diagnosis Date   ALCOHOLISM 12/22/2007   ANEMIA, IRON DEFICIENCY 12/26/2007   Arthritis    OA   Blood transfusion without reported diagnosis    as a child   DIABETES MELLITUS, TYPE II 11/10/2006   ESOPHAGITIS, REFLUX 12/22/2007  GERD (gastroesophageal reflux disease)    HYPERCHOLESTEROLEMIA 12/22/2007   HYPERTENSION 11/10/2006   HYPOGONADISM 12/22/2007   Prob. due to ETOH   HYPOKALEMIA 06/06/2008   Leukopenia    Neuromuscular disorder ([facility])    neuropathy in feet from Diabetes   Neuropathy    Chronic Neuropathy BOTH FEET   Pancytopenia 06/06/2008   RENAL INSUFFICIENCY 11/10/2006   SEES [place] [facility] STAGE 2 CKD    Past Surgical History:  Procedure Laterality Date   APPENDECTOMY  AGE 17 OR 10   COLONOSCOPY  08-21-2004/2017   [facility] normal/2017 [place]   COLONSCOPY  2017   ELECTROCARDIOGRAM  11/07/2006   ESOPHAGOGASTRODUODENOSCOPY  08/20/2004   EYE SURGERY Bilateral 2014   IOC FOR CATARACTS   I & D KNEE WITH POLY EXCHANGE Left 05/16/2019   Procedure: LEFT KNEE IRRIGATION AND DEBRIDEMENT WITH POLY EXCHANGE;  Surgeon: Frederik Pear, MD;  Location: [facility];  Service: Orthopedics;  Laterality: Left;   REPLACEMENT TOTAL KNEE Right 2011   TOTAL KNEE ARTHROPLASTY Left 02/20/2018   Procedure: LEFT TOTAL KNEE ARTHROPLASTY;  Surgeon: Frederik Pear, MD;  Location: [facility];  Service: Orthopedics;  Laterality: Left;   UPPER GASTROINTESTINAL ENDOSCOPY      Family History  Problem Relation Age of Onset   Cancer Mother        Colon Cancer   Colon cancer Mother 59   Colon polyps Brother    Esophageal cancer Neg Hx    Rectal cancer Neg Hx    Stomach cancer Neg Hx     Social History:  reports that he has never smoked. He has never used smokeless tobacco. He reports that he does not drink alcohol and does not use drugs.  Review of  Systems:  Last diabetic eye exam date 10/22  Last foot exam date: 5/23  Symptoms of neuropathy: Has tingling controlled with gabapentin  Hypertension:   Treatment includes lisinopril  BP Readings from Last 3 Encounters:  11/11/21 130/80  09/03/21 138/82  08/14/21 (!) 152/87   He has had chronic kidney disease without microalbuminuria  Lab Results  Component Value Date   CREATININE 1.92 (H) 11/06/2021   CREATININE 2.01 (H) 04/02/2021   CREATININE 2.21 (H) 10/18/2019    Lipid management: Lipids controlled with Crestor 5 mg daily    Lab Results  Component Value Date   CHOL 143 04/02/2021   CHOL 189 04/01/2020   CHOL 194 02/27/2019   Lab Results  Component Value Date   HDL 38.60 (L) 04/02/2021   HDL 38.50 (L) 04/01/2020   HDL 39.20 02/27/2019   Lab Results  Component Value Date   LDLCALC 81 04/02/2021   LDLCALC 124 (H) 04/01/2020   LDLCALC 129 (H) 02/27/2019   Lab Results  Component Value Date   TRIG 119.0 04/02/2021   TRIG 135.0 04/01/2020   TRIG 131.0 02/27/2019   Lab Results  Component Value Date   CHOLHDL 4 04/02/2021   CHOLHDL 5 04/01/2020   CHOLHDL 5 02/27/2019   No results found for: "LDLDIRECT"   Examination:   BP 130/80   Pulse 78   Ht '5\' 11"'$  (1.803 m)   Wt 295 lb 12.8 oz (134.2 kg)   SpO2 99%   BMI 41.26 kg/m   Body mass index is 41.26 kg/m.    ASSESSMENT/ PLAN:    Diabetes type 2:   Current regimen: Humalog mix insulin 30 units daily, Farxiga, Victoza  See history of present illness for detailed discussion of current diabetes management, blood sugar  patterns and problems identified  A1c is 7.8 lately but fructosamine is still high at 356  Blood glucose control is difficult to assess because of lack of glucose monitoring but likely has postprandial hyperglycemia at least after dinner with only once a day insulin Glucose monitoring is suboptimal Apparently choice of medications limited by his ability to afford co-pays However he  has difficulty losing weight, current BMI 41 not exercising and does not have any diet to follow  Recommendations:  Start monitoring blood sugar regularly and need to alternate fasting and 2 hours after meals He was given information on using the freestyle libre sensor and a list of DME supplies he can connect with Discussed that we can have him start the freestyle libre 3 which is available and should be covered He will also need to in the meantime start checking his blood sugars with new test trips Discussed blood sugar targets both before and after meals Start modifying diet with adding protein with breakfast consistently and making sure he is following a low-fat low calorie diet He can see the diabetes educator for comprehensive education and to start the [facility] sensor also Split insulin to 20 units in the morning and 10 at suppertime which hopefully will give him overall better control However may consider switching him to Humalog and Basaglar separately if he has significant variability and inconsistent postprandial control Discussed the importance of weight loss and improving insulin resistance Start exercising and he can use his home elliptical regularly He may be able to benefit from switching to Ozempic especially with maximum doses and will see if he can get him approved for the patient assistance program for this Follow-up in about 2 months  CKD: Followed by nephrologist  Patient Instructions  Use elliptical daily  Check blood sugars on waking up 3 days a week  Also check blood sugars about 2 hours after meals and do this after different meals by rotation  Recommended blood sugar levels on waking up are 90-130 and about 2 hours after meal is 130-180  Please bring your blood sugar monitor to each visit, thank you  Take 20 U before Bfst and 10 at supper  Add protein to Bfst   Total visit time for evaluation and management and counseling = 30 minutes  Mae Cianci 11/11/2021, 10:00 AM

## 2021-11-11 ENCOUNTER — Ambulatory Visit (INDEPENDENT_AMBULATORY_CARE_PROVIDER_SITE_OTHER): Payer: Medicare Other | Admitting: Endocrinology

## 2021-11-11 ENCOUNTER — Encounter: Payer: Self-pay | Admitting: Endocrinology

## 2021-11-11 VITALS — BP 130/80 | HR 78 | Ht 71.0 in | Wt 295.8 lb

## 2021-11-11 DIAGNOSIS — N1832 Chronic kidney disease, stage 3b: Secondary | ICD-10-CM | POA: Diagnosis not present

## 2021-11-11 DIAGNOSIS — E1165 Type 2 diabetes mellitus with hyperglycemia: Secondary | ICD-10-CM

## 2021-11-11 DIAGNOSIS — Z794 Long term (current) use of insulin: Secondary | ICD-10-CM

## 2021-11-11 DIAGNOSIS — I10 Essential (primary) hypertension: Secondary | ICD-10-CM | POA: Diagnosis not present

## 2021-11-11 MED ORDER — GLUCOSE BLOOD VI STRP: ORAL_STRIP | 12 refills | Status: DC

## 2021-11-11 NOTE — Patient Instructions (Addendum)
Use elliptical daily  Check blood sugars on waking up 3 days a week  Also check blood sugars about 2 hours after meals and do this after different meals by rotation  Recommended blood sugar levels on waking up are 90-130 and about 2 hours after meal is 130-180  Please bring your blood sugar monitor to each visit, thank you  Take 20 U before Bfst and 10 at supper  Add protein to Bfst

## 2021-11-19 ENCOUNTER — Telehealth: Payer: Self-pay

## 2021-11-19 DIAGNOSIS — N1832 Chronic kidney disease, stage 3b: Secondary | ICD-10-CM | POA: Diagnosis not present

## 2021-11-19 IMAGING — DX DG CHEST 2V
2 series · 2 of 2 positions shown · non-contrast
Comparison: November 22, 2017

CLINICAL DATA: Pedal edema.

EXAM:
CHEST - 2 VIEW

[chest pa]
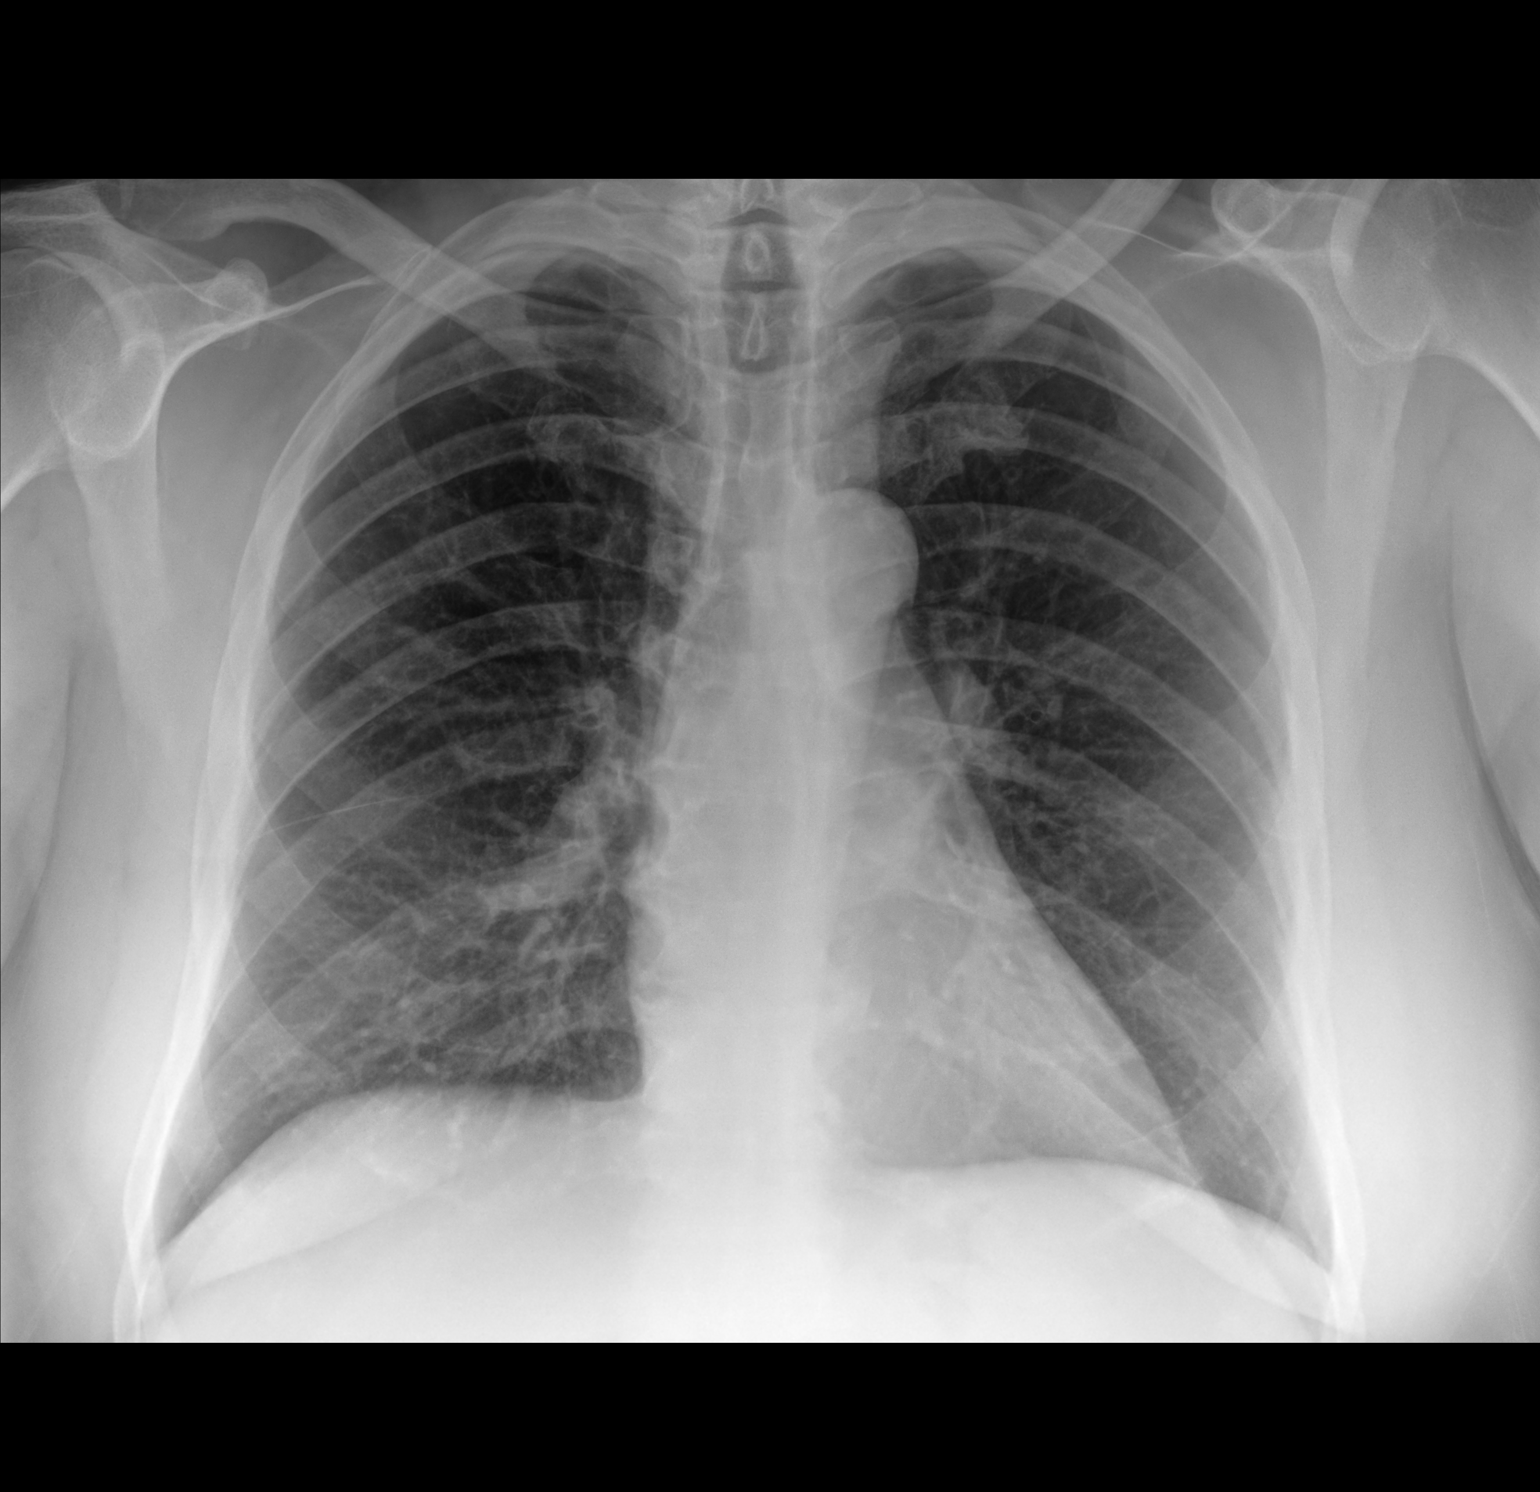

[chest lat]
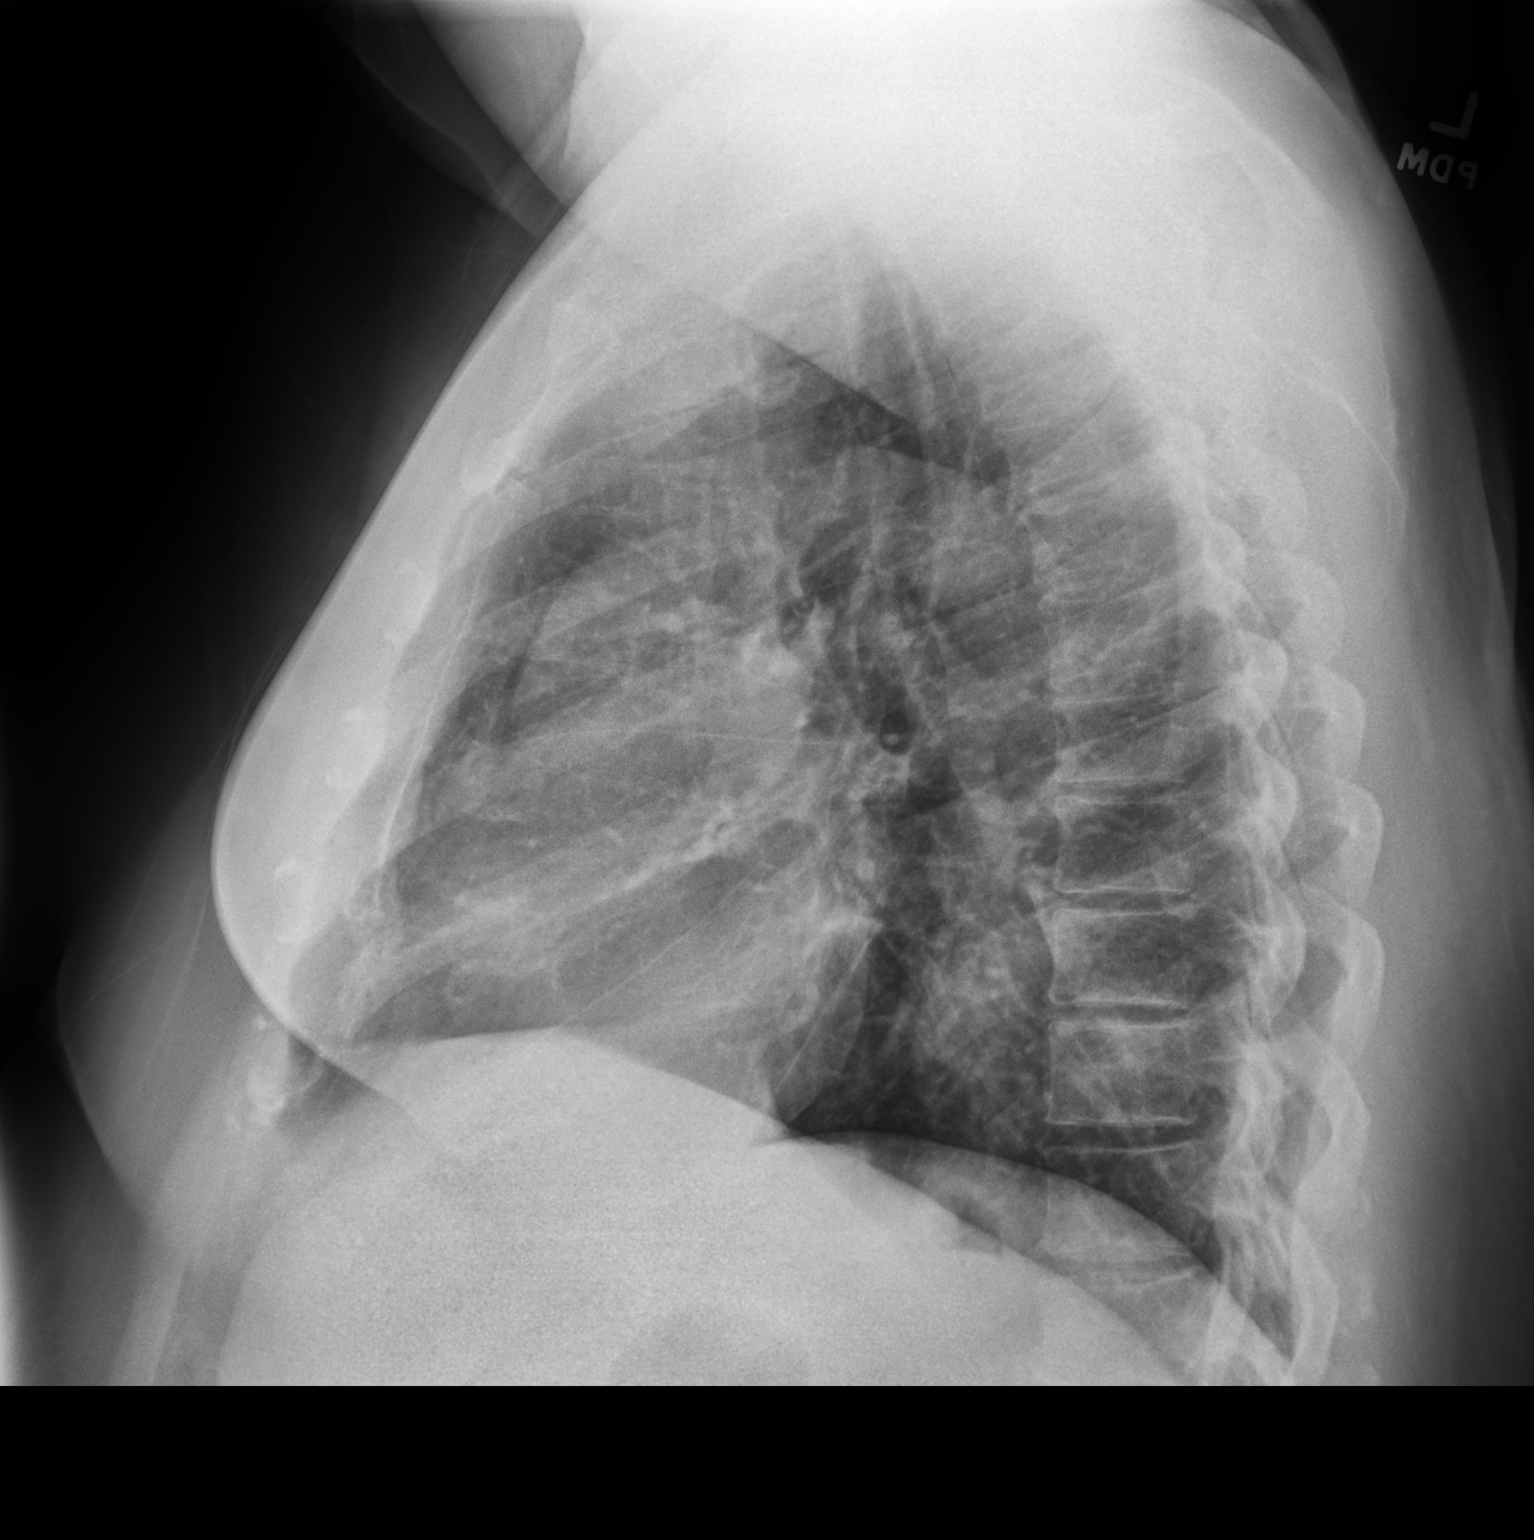

[2 of 2 positions shown; findings below may reference images not displayed]

FINDINGS: The heart size and mediastinal contours are within normal limits.
Both lungs are clear. Degenerative changes seen throughout the
thoracic spine.
IMPRESSION: No active cardiopulmonary disease.

## 2021-11-19 NOTE — Telephone Encounter (Signed)
Called patient to let him know that he has patient assistance for victoza (4boxes) Ready for pickup.No answer. Left Vm

## 2021-11-25 DIAGNOSIS — N1832 Chronic kidney disease, stage 3b: Secondary | ICD-10-CM | POA: Diagnosis not present

## 2021-11-25 DIAGNOSIS — E871 Hypo-osmolality and hyponatremia: Secondary | ICD-10-CM | POA: Diagnosis not present

## 2021-11-25 DIAGNOSIS — E872 Acidosis, unspecified: Secondary | ICD-10-CM | POA: Diagnosis not present

## 2021-11-25 DIAGNOSIS — I129 Hypertensive chronic kidney disease with stage 1 through stage 4 chronic kidney disease, or unspecified chronic kidney disease: Secondary | ICD-10-CM | POA: Diagnosis not present

## 2021-11-25 DIAGNOSIS — E1122 Type 2 diabetes mellitus with diabetic chronic kidney disease: Secondary | ICD-10-CM | POA: Diagnosis not present

## 2021-11-25 DIAGNOSIS — D631 Anemia in chronic kidney disease: Secondary | ICD-10-CM | POA: Diagnosis not present

## 2021-12-08 ENCOUNTER — Encounter: Payer: Self-pay | Admitting: Endocrinology

## 2021-12-09 ENCOUNTER — Other Ambulatory Visit: Payer: Self-pay

## 2021-12-09 DIAGNOSIS — E1165 Type 2 diabetes mellitus with hyperglycemia: Secondary | ICD-10-CM

## 2021-12-09 MED ORDER — ONETOUCH ULTRA VI STRP: ORAL_STRIP | 12 refills | Status: DC

## 2021-12-10 ENCOUNTER — Other Ambulatory Visit: Payer: Self-pay

## 2021-12-10 DIAGNOSIS — E1165 Type 2 diabetes mellitus with hyperglycemia: Secondary | ICD-10-CM

## 2021-12-10 MED ORDER — ONETOUCH ULTRA VI STRP: ORAL_STRIP | 12 refills | Status: DC

## 2021-12-24 ENCOUNTER — Other Ambulatory Visit: Payer: Self-pay | Admitting: Family

## 2021-12-30 ENCOUNTER — Other Ambulatory Visit: Payer: Self-pay | Admitting: Endocrinology

## 2022-01-12 ENCOUNTER — Telehealth: Payer: Self-pay | Admitting: *Deleted

## 2022-01-12 ENCOUNTER — Encounter: Payer: Self-pay | Admitting: *Deleted

## 2022-01-12 NOTE — Patient Outreach (Signed)
  Care Coordination   01/12/2022 Name: Quantavious Eggert MRN: [id number] DOB: 12-Jun-1955   Care Coordination Outreach Attempts:  An unsuccessful telephone outreach was attempted today to offer the patient information about available care coordination services as a benefit of their health plan.   Follow Up Plan:  Additional outreach attempts will be made to offer the patient care coordination information and services.   Encounter Outcome:  No Answer left voice messages x 2 on both numbers listed on file for patient, requesting call back  Care Coordination Interventions Activated:  No   Care Coordination Interventions:  No, not indicated unsuccessful outreach attempt # 1   Oneta Rack, RN, BSN, CCRN Alumnus RN CM Care Coordination/ Transition of [place] [facility] [phone]: direct office

## 2022-01-18 ENCOUNTER — Telehealth: Payer: Self-pay | Admitting: *Deleted

## 2022-01-18 ENCOUNTER — Encounter: Payer: Self-pay | Admitting: *Deleted

## 2022-01-18 NOTE — Patient Outreach (Signed)
  Care Coordination   01/18/2022 Name: David Lindsey MRN: [id number] DOB: [birth year]   Care Coordination Outreach Attempts:  A second unsuccessful outreach was attempted today to offer the patient with information about available care coordination services as a benefit of their health plan.     Follow Up Plan:  Additional outreach attempts will be made to offer the patient care coordination information and services.   Encounter Outcome:  No Answer left voice messages on both numbers on file, requesting call back  Care Coordination Interventions Activated:  No   Care Coordination Interventions:  No, not indicated unsuccessful outreach attempt # 2    Oneta Rack, RN, BSN, CCRN Alumnus RN CM Care Coordination/ Transition of [place] Management [phone]: direct office

## 2022-01-20 ENCOUNTER — Telehealth: Payer: Self-pay | Admitting: *Deleted

## 2022-01-20 ENCOUNTER — Encounter: Payer: Self-pay | Admitting: *Deleted

## 2022-01-20 NOTE — Patient Outreach (Signed)
  Care Coordination   01/20/2022 Name: David Lindsey MRN: [id number] DOB: Sep 21, 1955   Care Coordination Outreach Attempts:  A third unsuccessful outreach was attempted today to offer the patient with information about available care coordination services as a benefit of their health plan.   Follow Up Plan:  Additional outreach attempts will be made to offer the patient care coordination information and services.  Patient returned my call from yesterday and left voice message requesting call back; upon completion of returning patient's call- he did not answer; left voice message that I would re-attempt call to him on Friday morning, January 22, 2022 and encouraged patient to listen for my call  Encounter Outcome:  No Answer left voice message, as above  Care Coordination Interventions Activated:  No   Care Coordination Interventions:  No, not indicated unsuccessful outreach attempt # 3-- will re-attempt outreach again later this week    Oneta Rack, RN, BSN, [place] RN CM Care Coordination/ Transition of [place] Management [phone]: direct office

## 2022-01-22 ENCOUNTER — Encounter: Payer: Self-pay | Admitting: *Deleted

## 2022-01-22 ENCOUNTER — Telehealth: Payer: Self-pay | Admitting: *Deleted

## 2022-01-22 NOTE — Patient Instructions (Signed)
Visit Information  Thank you for taking time to visit with me today. Please don't hesitate to contact me if I can be of assistance to you.   Following are the goals we discussed today:   Goals Addressed             This Visit's Progress    Care Coordination Activities: No follow up required   On track    Care Coordination Interventions: Evaluation of current treatment plan related to DM, general health maintenance and patient's adherence to plan as established by provider Advised patient to provide appropriate vaccination information to provider or CM team member at next visit Advised patient to begin quantifying his sporadic, short, self-limiting episodes at home of chest discomfort; to begin monitoring his blood pressures at home; to call 911 if he has ongoing chest discomfort that lasts more than a few minutes and associated with shortness of breath, nausea, clamminess, radiation to jaw or arms, dizziness or lightheadedness Provided education to patient re: signs/ symptoms and corresponding action plan for episodes of chest discomfort Provided patient with MyChart educational materials related to chest pain/ action plan for same Reviewed scheduled/upcoming provider appointments including 01/28/22- endocrinology provider; noted no scheduled appointment with PCP- annual exam due in December- encouraged patient to schedule this appointment and to discuss his brief/ sporadic episodes of chest discomfort with her Advised patient to discuss possible referral to Weight loss center; episodes of sporadic, self-limiting episodes of chest discomfort  with provider Screening for signs and symptoms of depression related to chronic disease state  Assessed social determinant of health barriers Reviewed recent blood sugars at home: reports fasting blood sugars in general range between 130-140; post-prandial ranges between 160-190; confirmed patient attended Medicare Annual Wellness Visit on 08/14/21;  confirmed patient obtained his flu, RSV, and COVID vaccines [facility]; confirmed patient has heard from [place] this morning to schedule appointment- he verbalizes plans to return their call today and this was encouraged- he confirms he has the contact information for the [facility]; confirmed patient continues making dietary changes as recommended and that he tries to follow recommended diet for DMII as instructed- he is looking forward to learning more when he visits with DEC team          If you are experiencing a Mental Health or [place] or need someone to talk to, please  call the Suicide and Crisis Lifeline: 988 call the [place] National Suicide Prevention Lifeline: 714-542-8214 or TTY: 701 144 1845 TTY 210 877 4877) to talk to a trained counselor call 1-800-273-TALK (toll free, 24 hour hotline) go to [facility] [address], [place] [phone]) call the [place]: [phone] call 911   Patient verbalizes understanding of instructions and care plan provided today and agrees to view in [place]. Active MyChart status and patient understanding of how to access instructions and care plan via MyChart confirmed with patient.     No further follow up required: no further or ongoing care coordination needs identified today  Oneta Rack, RN, BSN, CCRN Alumnus RN CM Care Coordination/ Transition of [facility] [phone]: direct office

## 2022-01-22 NOTE — Patient Outreach (Signed)
Care Coordination   Initial Visit Note   01/22/2022 Name: Nori Poland MRN: [id number] DOB: [birth year]  Vernel Langenderfer is a 66 y.o. year old male who sees Marrian Salvage, [place] for primary care. I spoke with  Anne Fu by phone today.  What matters to the patients health and wellness today?  "I am doing real well and not having any problems.  I am checking my blood sugars the way Dr. Dwyane Dee told me to, and they are looking good, right in the ranges he said he wants them in.  I just got a call from the [facility], I am going to call them back and schedule an appointment with them because I want to stay on track.  I am thinking about asking Mickel Baas if she will make referral for me to the [facility].  [facility] our flu, COVID, and [facility]  Every now and then, I get these little chest pains that only last a minute or two and then they go away.... I never feel bad when it happens, but I wonder if I should be concerned about it?  Interventions provided; no further or ongoing care coordination needs identified today   Goals Addressed             This Visit's Progress    Care Coordination Activities: No follow up required   On track    Care Coordination Interventions: Evaluation of current treatment plan related to DM, general health maintenance and patient's adherence to plan as established by provider Advised patient to provide appropriate vaccination information to provider or CM team member at next visit Advised patient to begin quantifying his sporadic, short, self-limiting episodes at home of chest discomfort; to begin monitoring his blood pressures at home; to call 911 if he has ongoing chest discomfort that lasts more than a few minutes and associated with shortness of breath, nausea, clamminess, radiation to jaw or arms, dizziness or lightheadedness Provided education to patient re: signs/ symptoms and  corresponding action plan for episodes of chest discomfort Provided patient with MyChart educational materials related to chest pain/ action plan for same Reviewed scheduled/upcoming provider appointments including 01/28/22- endocrinology provider; noted no scheduled appointment with PCP- annual exam due in December- encouraged patient to schedule this appointment and to discuss his brief/ sporadic episodes of chest discomfort with her Advised patient to discuss possible referral to Weight loss center; episodes of sporadic, self-limiting episodes of chest discomfort  with provider Screening for signs and symptoms of depression related to chronic disease state  Assessed social determinant of health barriers Reviewed recent blood sugars at home: reports fasting blood sugars in general range between 130-140; post-prandial ranges between 160-190; confirmed patient attended Medicare Annual Wellness Visit on 08/14/21; confirmed patient obtained his flu, RSV, and COVID vaccines [facility]; confirmed patient has heard from [place] this morning to schedule appointment- he verbalizes plans to return their call today and this was encouraged- he confirms he has the contact information for the [place]; confirmed patient continues making dietary changes as recommended and that he tries to follow recommended diet for DMII as instructed- he is looking forward to learning more when he visits with DEC team          SDOH assessments and interventions completed:  Yes  SDOH Interventions Today    Flowsheet Row Most Recent Value  SDOH Interventions   Food Insecurity  Interventions Intervention Not Indicated  Transportation Interventions Intervention Not Indicated  [drives self]       Care Coordination Interventions Activated:  Yes  Care Coordination Interventions:  Yes, provided   Follow up plan: No further intervention required.   Encounter Outcome:  Pt. Visit Completed    Oneta Rack, RN, BSN, CCRN Alumnus RN CM Care Coordination/ Transition of [place] [facility] [phone]: direct office

## 2022-01-26 ENCOUNTER — Other Ambulatory Visit (INDEPENDENT_AMBULATORY_CARE_PROVIDER_SITE_OTHER): Payer: Medicare Other

## 2022-01-26 DIAGNOSIS — Z794 Long term (current) use of insulin: Secondary | ICD-10-CM | POA: Diagnosis not present

## 2022-01-26 DIAGNOSIS — E1165 Type 2 diabetes mellitus with hyperglycemia: Secondary | ICD-10-CM

## 2022-01-26 LAB — GLUCOSE, RANDOM: Glucose, Bld: 120 mg/dL — ABNORMAL HIGH (ref 70–99)

## 2022-01-26 LAB — HEMOGLOBIN A1C: Hgb A1c MFr Bld: 8.5 % — ABNORMAL HIGH (ref 4.6–6.5)

## 2022-01-28 ENCOUNTER — Ambulatory Visit (INDEPENDENT_AMBULATORY_CARE_PROVIDER_SITE_OTHER): Payer: Medicare Other | Admitting: Endocrinology

## 2022-01-28 ENCOUNTER — Encounter: Payer: Self-pay | Admitting: Endocrinology

## 2022-01-28 VITALS — BP 116/74 | HR 87 | Ht 71.0 in | Wt 295.8 lb

## 2022-01-28 DIAGNOSIS — Z794 Long term (current) use of insulin: Secondary | ICD-10-CM | POA: Diagnosis not present

## 2022-01-28 DIAGNOSIS — E1165 Type 2 diabetes mellitus with hyperglycemia: Secondary | ICD-10-CM

## 2022-01-28 DIAGNOSIS — I10 Essential (primary) hypertension: Secondary | ICD-10-CM | POA: Diagnosis not present

## 2022-01-28 NOTE — Progress Notes (Signed)
Patient ID: David Lindsey, male   DOB: [birth year], 66 y.o.   MRN: [id number]           Reason for Appointment: Type II Diabetes follow-up   History of Present Illness   Diagnosis date: 2013  Previous history:  Non-insulin hypoglycemic drugs previously used: Metformin, Invokana, Amaryl, Prandin Metformin stopped in 2019 Farxiga started in 2021 replacing Invokana Insulin was started in 5/20, generally has been on premixed insulin  A1c range in the last few years is: 6.4-9.7  Recent history:     Non-insulin hypoglycemic drugs: Farxiga 10 mg daily, Victoza 1.8 mg daily     Insulin regimen: Humalog mix 20 units in a.m. 10 units before dinner          Side effects from medications: Actos: Edema ?  Current self management, blood sugar patterns and problems identified:  A1c is back up to 8.5  He is still not able to get the freestyle libre sensor and not clear why his prescription was not completed  He has been told to check blood sugars after meals but he is only checking in the mornings and after breakfast Even with taking 2 shots of insulin a day his fasting readings are still relatively high in the morning They are likely higher after dinner send his average blood sugar recently is 152 He is not able to lose weight and has been on Victoza for some time, currently getting through patient assistance program  With reducing his morning insulin he has not felt hypoglycemic all the lowest blood sugar is 81 midmorning likely from not getting enough protein at breakfast Exercise: Walking or elliptical 2-3/7 days  Diet management: Breakfast: Mostly fruit     Hypoglycemia: Rarely  Glucometer: One Touch.           Blood Glucose readings from meter download   PRE-MEAL Fasting Lunch Dinner Bedtime Overall  Glucose range: 140-221    81-221  Mean/median:     152   POST-MEAL PC Breakfast PC Lunch PC Dinner  Glucose range:   ?  Mean/median:      Prior   PRE-MEAL Fasting Lunch  Dinner Bedtime Overall  Glucose range: 120 ? 27   85-190  Mean/median:        POST-MEAL PC Breakfast PC Lunch PC Dinner  Glucose range:     Mean/median:       Dietician visit: Most recent: 2000     Weight control:  Wt Readings from Last 3 Encounters:  01/28/22 295 lb 12.8 oz (134.2 kg)  11/11/21 295 lb 12.8 oz (134.2 kg)  08/14/21 (!) 301 lb 12.8 oz (136.9 kg)            Diabetes labs:  Lab Results  Component Value Date   HGBA1C 8.5 (H) 01/26/2022   HGBA1C 7.7 09/03/2021   HGBA1C 9.3 (A) 05/20/2021   Lab Results  Component Value Date   MICROALBUR 3.5 (H) 11/06/2021   LDLCALC 81 04/02/2021   CREATININE 1.92 (H) 11/06/2021    Lab Results  Component Value Date   FRUCTOSAMINE 356 (H) 11/06/2021   FRUCTOSAMINE 348 (H) 12/29/2017   Lab Results  Component Value Date   HGB 14.1 04/02/2021      Allergies as of 01/28/2022       Reactions   Pioglitazone Swelling        Medication List        Accurate as of January 28, 2022  8:28 PM. If you have any questions, ask  your nurse or doctor.          amLODipine 10 MG tablet Commonly known as: NORVASC Take 1 tablet (10 mg total) by mouth daily.   Apple Cider Vinegar 600 MG Caps Take 600 mg by mouth daily.   B-12 5000 MCG Caps Take 5,000 mcg by mouth every evening.   cholecalciferol 25 MCG (1000 UNIT) tablet Commonly known as: VITAMIN D3 Take 1,000 Units by mouth daily.   clotrimazole-betamethasone cream Commonly known as: LOTRISONE APPLY  CREAM TOPICALLY TO AFFECTED AREA THREE TIMES DAILY AS NEEDED FOR  RASH   CoQ-10 200 MG Caps Take 200 mg by mouth every evening.   dapagliflozin propanediol 10 MG Tabs tablet Commonly known as: Farxiga Take 1 tablet (10 mg total) by mouth daily.   diclofenac Sodium 1 % Gel Commonly known as: VOLTAREN Apply topically 4 (four) times daily.   docusate sodium 100 MG capsule Commonly known as: COLACE Take 200 mg by mouth every evening.   FreeStyle Libre 2  Sensor Misc 1 Device by Does not apply route every 14 (fourteen) days.   gabapentin 300 MG capsule Commonly known as: NEURONTIN TAKE 1 CAPSULE BY MOUTH 3 TIMES  DAILY   glucose blood test strip Use as instructed twice a day at different times   OneTouch Ultra test strip Generic drug: glucose blood Use as instructed Check blood sugars on waking up 3 days a week   HAIR SKIN & NAILS GUMMIES PO Take 1 tablet by mouth every evening.   HumaLOG Mix 50/50 KwikPen (50-50) 100 UNIT/ML Kwikpen Generic drug: Insulin Lispro Prot & Lispro Inject 30 Units into the skin daily with breakfast. And pen needles 1/day What changed: additional instructions   lisinopril 10 MG tablet Commonly known as: ZESTRIL Take 1 tablet (10 mg total) by mouth daily.   LUBRICATING EYE DROPS OP Place 1 drop into both eyes daily as needed (dry eyes).   Menthol (Topical Analgesic) 8 % Liqd Apply 1 application topically 3 (three) times daily as needed (for knee pain.).   Methylcellulose (Laxative) 500 MG Tabs Take 500 mg by mouth daily. Fiberwell Gummies   metoprolol succinate 25 MG 24 hr tablet Commonly known as: TOPROL-XL Take 1/2 tablet by mouth at bedtime   MM Pen Needles 31G X 8 MM Misc Generic drug: Insulin Pen Needle AS DIRECTED WITH  VICTOZA   multivitamin with minerals Tabs tablet Take 1 tablet by mouth every evening.   Omega-3-6-9 Caps Take 1 capsule by mouth every evening. '1600mg'$    omeprazole 20 MG capsule Commonly known as: PRILOSEC Take 1 capsule (20 mg total) by mouth daily.   rosuvastatin 5 MG tablet Commonly known as: CRESTOR Take 1 tablet (5 mg total) by mouth daily.   sildenafil 100 MG tablet Commonly known as: VIAGRA Take 1 tablet (100 mg total) by mouth daily as needed.   SODIUM BICARBONATE PO Take 1 tablet by mouth 2 (two) times daily.   traZODone 100 MG tablet Commonly known as: DESYREL TAKE 1 TABLET BY MOUTH AT  BEDTIME   trolamine salicylate 10 % cream Commonly  known as: ASPERCREME Apply 1 application topically as needed (knee pain).   Victoza 18 MG/3ML Sopn Generic drug: liraglutide Inject 1.8 mg into the skin every morning.   vitamin C 1000 MG tablet Take 1,000 mg by mouth every evening.   vitamin E 180 MG (400 UNITS) capsule Take 400 Units by mouth every evening.        Allergies:  Allergies  Allergen Reactions   Pioglitazone Swelling    Past Medical History:  Diagnosis Date   ALCOHOLISM 12/22/2007   ANEMIA, IRON DEFICIENCY 12/26/2007   Arthritis    OA   Blood transfusion without reported diagnosis    as a child   DIABETES MELLITUS, TYPE II 11/10/2006   ESOPHAGITIS, REFLUX 12/22/2007   GERD (gastroesophageal reflux disease)    HYPERCHOLESTEROLEMIA 12/22/2007   HYPERTENSION 11/10/2006   HYPOGONADISM 12/22/2007   Prob. 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    Past Surgical History:  Procedure Laterality Date   APPENDECTOMY  AGE 61 OR 10   COLONOSCOPY  08-21-2004/2017   [facility]- normal/2017 [place]   COLONSCOPY  2017   ELECTROCARDIOGRAM  11/07/2006   ESOPHAGOGASTRODUODENOSCOPY  08/20/2004   EYE SURGERY Bilateral 2014   IOC FOR CATARACTS   I & D KNEE WITH POLY EXCHANGE Left 05/16/2019   Procedure: LEFT KNEE IRRIGATION AND DEBRIDEMENT WITH POLY EXCHANGE;  Surgeon: Frederik Pear, MD;  Location: [facility];  Service: Orthopedics;  Laterality: Left;   REPLACEMENT TOTAL KNEE Right 2011   TOTAL KNEE ARTHROPLASTY Left 02/20/2018   Procedure: LEFT TOTAL KNEE ARTHROPLASTY;  Surgeon: Frederik Pear, MD;  Location: [facility];  Service: Orthopedics;  Laterality: Left;   UPPER GASTROINTESTINAL ENDOSCOPY      Family History  Problem Relation Age of Onset   Cancer Mother        Colon Cancer   Colon cancer Mother 64   Colon polyps  Brother    Esophageal cancer Neg Hx    Rectal cancer Neg Hx    Stomach cancer Neg Hx     Social History:  reports that he has never smoked. He has never used smokeless tobacco. He reports that he does not drink alcohol and does not use drugs.  Review of Systems:  Last diabetic eye exam date 10/22  Last foot exam date: 5/23  Symptoms of neuropathy: Has tingling controlled with gabapentin  Hypertension:   Treatment includes lisinopril  BP Readings from Last 3 Encounters:  01/28/22 116/74  11/11/21 130/80  09/03/21 138/82   He has had chronic kidney disease without microalbuminuria  Lab Results  Component Value Date   CREATININE 1.92 (H) 11/06/2021   CREATININE 2.01 (H) 04/02/2021   CREATININE 2.21 (H) 10/18/2019    Lipid management: Lipids controlled with Crestor 5 mg daily    Lab Results  Component Value Date   CHOL 143 04/02/2021   CHOL 189 04/01/2020   CHOL 194 02/27/2019   Lab Results  Component Value Date   HDL 38.60 (L) 04/02/2021   HDL 38.50 (L) 04/01/2020   HDL 39.20 02/27/2019   Lab Results  Component Value Date   LDLCALC 81 04/02/2021   LDLCALC 124 (H) 04/01/2020   LDLCALC 129 (H) 02/27/2019   Lab Results  Component Value Date   TRIG 119.0 04/02/2021   TRIG 135.0 04/01/2020   TRIG 131.0 02/27/2019   Lab Results  Component Value Date   CHOLHDL 4 04/02/2021   CHOLHDL 5 04/01/2020   CHOLHDL 5 02/27/2019   No results found for: "LDLDIRECT"   Examination:   BP 116/74   Pulse 87   Ht '5\' 11"'$  (1.803 m)   Wt 295 lb 12.8  oz (134.2 kg)   SpO2 98%   BMI 41.26 kg/m   Body mass index is 41.26 kg/m.    ASSESSMENT/ PLAN:    Diabetes type 2 with severe obesity:   Current regimen: Humalog mix insulin 30 units per day, Farxiga, Victoza  See history of present illness for detailed discussion of current diabetes management, blood sugar patterns and problems identified  A1c is 8.5  Blood glucose control is difficult to assess because of  inadequate glucose monitoring after meals especially dinner  Not clear if A1c is higher because of inadequate management of diet, he has been taking his Victoza and [place] regularly  Also likely not benefiting much from [place] because of renal dysfunction  He does not like to check blood sugars very often and is still having difficulty getting CGM through the DME supplier Still has severe obesity with a BMI of 41   Explained his A1c results and discussed need for better control and weight loss  Recommendations:  Start monitoring blood sugar after dinner consistently and rotate at different times  His prescription for the freestyle [place] will be checked again at the [facility] website Discussed blood sugar targets after meals Since he has low readings midday and likely higher readings after dinner will change his insulin to 16 units twice daily However discussed that if he has consistently high readings after lunch or dinner may consider 2 separate insulins for basal bolus regimen Also when he finishes Victoza he will need to switch to Ozempic and application given for the patient assistance program Discussed how Ozempic will be used and differences between this and Victoza  Follow-up with the CDE once he is able to get the freestyle libre sensor, needs more extensive diabetes teaching  CKD: Followed by nephrologist  Lipids to be followed up by PCP in December  Patient Instructions  Insulin 16 units before Bfst and supper  Check blood sugars on waking up 3-4 days a week  Also check blood sugars about 2 hours after meals and do this after different meals by rotation  Recommended blood sugar levels on waking up are 90-130 and about 2 hours after meal is 130-160  Please bring your blood sugar monitor to each visit, thank you  Exercise daily   Total visit time for evaluation and management and counseling = 30 minutes  Anneta Rounds 01/28/2022, 8:28 PM

## 2022-01-28 NOTE — Patient Instructions (Addendum)
Insulin 16 units before Bfst and supper  Check blood sugars on waking up 3-4 days a week  Also check blood sugars about 2 hours after meals and do this after different meals by rotation  Recommended blood sugar levels on waking up are 90-130 and about 2 hours after meal is 130-160  Please bring your blood sugar monitor to each visit, thank you  Exercise daily

## 2022-02-01 ENCOUNTER — Telehealth: Payer: Self-pay

## 2022-02-01 NOTE — Telephone Encounter (Signed)
Pt assistance for farxiga denied. Patient does not meet the income eligibility.

## 2022-02-08 NOTE — Telephone Encounter (Signed)
Spoke with patient. States he will call patient assistance abput farxiga because he doesn't understand why he was denied. Will call office back

## 2022-02-22 DIAGNOSIS — Z794 Long term (current) use of insulin: Secondary | ICD-10-CM | POA: Diagnosis not present

## 2022-02-22 DIAGNOSIS — E118 Type 2 diabetes mellitus with unspecified complications: Secondary | ICD-10-CM | POA: Diagnosis not present

## 2022-02-23 ENCOUNTER — Telehealth: Payer: Self-pay

## 2022-02-23 NOTE — Telephone Encounter (Signed)
Lvm for pt advising Patient assistance Victoza (3 boxes) have been been delivered and ready for pick up.

## 2022-02-25 NOTE — Telephone Encounter (Signed)
Patient picked up patient assistance - log noted  

## 2022-02-26 ENCOUNTER — Other Ambulatory Visit: Payer: Self-pay | Admitting: Family

## 2022-02-26 ENCOUNTER — Other Ambulatory Visit: Payer: Self-pay | Admitting: Endocrinology

## 2022-03-01 ENCOUNTER — Telehealth: Payer: Self-pay | Admitting: Endocrinology

## 2022-03-01 NOTE — Telephone Encounter (Signed)
Patient dropped completed Ozempic patient assistance paperwork. Placed in provider box at front desk

## 2022-03-11 ENCOUNTER — Telehealth: Payer: Self-pay | Admitting: Endocrinology

## 2022-03-11 ENCOUNTER — Telehealth: Payer: Self-pay

## 2022-03-11 ENCOUNTER — Other Ambulatory Visit: Payer: Self-pay | Admitting: Endocrinology

## 2022-03-11 DIAGNOSIS — E1165 Type 2 diabetes mellitus with hyperglycemia: Secondary | ICD-10-CM

## 2022-03-11 NOTE — Telephone Encounter (Signed)
Patient notified and has been scheduled

## 2022-03-11 NOTE — Telephone Encounter (Signed)
Patient received libre 2 and needs to schedule for training.

## 2022-03-11 NOTE — Telephone Encounter (Signed)
Patient brought in completed [employer] Patient Assistance Program Application and [employer].  Forms are in Dr. Ronnie Derby folder in front office.

## 2022-03-15 ENCOUNTER — Encounter: Payer: Medicare Other | Attending: Endocrinology | Admitting: Registered"

## 2022-03-15 DIAGNOSIS — N181 Chronic kidney disease, stage 1: Secondary | ICD-10-CM | POA: Insufficient documentation

## 2022-03-15 DIAGNOSIS — E1122 Type 2 diabetes mellitus with diabetic chronic kidney disease: Secondary | ICD-10-CM | POA: Insufficient documentation

## 2022-03-15 NOTE — Progress Notes (Signed)
CGM Training - Freestyle Libre 3  Start [phone]    End time: 1035 Total time: 58 min  Patient arrived with a Libre 3 sensor and a Libre 2 reader.  Tried to start the [place] 3 sensor with the phone app. The phone app would not respond to the sensor we attached to patient's arm. Pt was instructed to try again with a new sensor at home and if his phone app still won't start the sensor to contact [employer].  Pt states he wants to use both reader and phone app. That is possible, but he needs prescriptions for the sensors and reader to match. (Either both need to be Libre 2 or 3)  '[x]'$   Download app to phone '[]'$   Marita Snellen for reports and data sharing. Accept invite to share data '[x]'$   Getting to know device: Sensor:  30 min in water up to 3 meters. Receiver: Phone vs reader '[x]'$   Setting up device (high alert  250, low alert urgent low 55) '[x]'$   Setting alert profile: alarms are very loud. Also, not to panic if getting message that blood sugar is dropping '[x]'$   Inserting sensor: After application it takes 60 min initial warm up before can get a reading. First 12 hrs blood drop symbol means to use finger stick to make treatment decisions '[x]'$   Calibrating  '[]'$   Ending sensor session '[x]'$   Trouble shooting: High dose vitamin C >500 mg/day can affect accuracy of readings '[]'$   Tape guide:  Tegaderm I.V. or Over-bandage. Be sure to leave the opening/hole over the center of the sensor uncovered '[]'$   Insulin dosing from CGM readings. N/a  Patient has Forensic scientist phone number

## 2022-03-16 DIAGNOSIS — E118 Type 2 diabetes mellitus with unspecified complications: Secondary | ICD-10-CM | POA: Diagnosis not present

## 2022-03-16 DIAGNOSIS — Z794 Long term (current) use of insulin: Secondary | ICD-10-CM | POA: Diagnosis not present

## 2022-03-16 NOTE — Telephone Encounter (Signed)
Forms awaiting signatures

## 2022-03-18 NOTE — Telephone Encounter (Signed)
What dose of ozempic are you putting patient on?

## 2022-03-19 NOTE — Telephone Encounter (Signed)
Patient will stop by office to pick up sample of ozempic.

## 2022-03-19 NOTE — Telephone Encounter (Signed)
noted 

## 2022-03-22 NOTE — Telephone Encounter (Signed)
Patient came and picked up sample states he has some victoza left. Informed him that Dr Dwyane Dee stated to finish victoza first and then switch to ozempic.

## 2022-03-24 DIAGNOSIS — E118 Type 2 diabetes mellitus with unspecified complications: Secondary | ICD-10-CM | POA: Diagnosis not present

## 2022-03-24 DIAGNOSIS — Z794 Long term (current) use of insulin: Secondary | ICD-10-CM | POA: Diagnosis not present

## 2022-04-06 ENCOUNTER — Encounter: Payer: Medicare Other | Admitting: Family

## 2022-04-13 ENCOUNTER — Ambulatory Visit (INDEPENDENT_AMBULATORY_CARE_PROVIDER_SITE_OTHER): Payer: Medicare Other | Admitting: Family

## 2022-04-13 ENCOUNTER — Encounter: Payer: Self-pay | Admitting: Family

## 2022-04-13 VITALS — BP 118/82 | HR 80 | Temp 98.1°F | Resp 18 | Ht 71.0 in | Wt 294.4 lb

## 2022-04-13 DIAGNOSIS — Z23 Encounter for immunization: Secondary | ICD-10-CM | POA: Diagnosis not present

## 2022-04-13 DIAGNOSIS — Z0000 Encounter for general adult medical examination without abnormal findings: Secondary | ICD-10-CM | POA: Diagnosis not present

## 2022-04-13 DIAGNOSIS — Z125 Encounter for screening for malignant neoplasm of prostate: Secondary | ICD-10-CM | POA: Diagnosis not present

## 2022-04-13 DIAGNOSIS — E291 Testicular hypofunction: Secondary | ICD-10-CM | POA: Diagnosis not present

## 2022-04-13 DIAGNOSIS — E7800 Pure hypercholesterolemia, unspecified: Secondary | ICD-10-CM | POA: Diagnosis not present

## 2022-04-13 LAB — CBC WITH DIFFERENTIAL/PLATELET
Basophils Absolute: 0 10*3/uL (ref 0.0–0.1)
Basophils Relative: 0.5 % (ref 0.0–3.0)
Eosinophils Absolute: 0.4 10*3/uL (ref 0.0–0.7)
Eosinophils Relative: 5.6 % — ABNORMAL HIGH (ref 0.0–5.0)
HCT: 46.2 % (ref 39.0–52.0)
Hemoglobin: 15.3 g/dL (ref 13.0–17.0)
Lymphocytes Relative: 21.4 % (ref 12.0–46.0)
Lymphs Abs: 1.4 10*3/uL (ref 0.7–4.0)
MCHC: 33.2 g/dL (ref 30.0–36.0)
MCV: 86.3 fl (ref 78.0–100.0)
Monocytes Absolute: 0.6 10*3/uL (ref 0.1–1.0)
Monocytes Relative: 8.7 % (ref 3.0–12.0)
Neutro Abs: 4.2 10*3/uL (ref 1.4–7.7)
Neutrophils Relative %: 63.8 % (ref 43.0–77.0)
Platelets: 178 10*3/uL (ref 150.0–400.0)
RBC: 5.35 Mil/uL (ref 4.22–5.81)
RDW: 13.1 % (ref 11.5–15.5)
WBC: 6.6 10*3/uL (ref 4.0–10.5)

## 2022-04-13 LAB — COMPREHENSIVE METABOLIC PANEL
ALT: 20 U/L (ref 0–53)
AST: 18 U/L (ref 0–37)
Albumin: 4.2 g/dL (ref 3.5–5.2)
Alkaline Phosphatase: 63 U/L (ref 39–117)
BUN: 19 mg/dL (ref 6–23)
CO2: 22 mEq/L (ref 19–32)
Calcium: 9.4 mg/dL (ref 8.4–10.5)
Chloride: 102 mEq/L (ref 96–112)
Creatinine, Ser: 2.03 mg/dL — ABNORMAL HIGH (ref 0.40–1.50)
GFR: 33.13 mL/min — ABNORMAL LOW (ref >60.00)
Glucose, Bld: 145 mg/dL — ABNORMAL HIGH (ref 70–99)
Potassium: 4.3 mEq/L (ref 3.5–5.1)
Sodium: 136 mEq/L (ref 135–145)
Total Bilirubin: 0.4 mg/dL (ref 0.2–1.2)
Total Protein: 7.4 g/dL (ref 6.0–8.3)

## 2022-04-13 LAB — LIPID PANEL
Cholesterol: 157 mg/dL (ref 0–200)
HDL: 42.1 mg/dL (ref >39.00)
LDL Cholesterol: 95 mg/dL (ref 0–99)
NonHDL: 114.84
Total CHOL/HDL Ratio: 4
Triglycerides: 99 mg/dL (ref 0.0–149.0)
VLDL: 19.8 mg/dL (ref 0.0–40.0)

## 2022-04-13 LAB — PSA: PSA: 0.86 ng/mL (ref 0.10–4.00)

## 2022-04-13 MED ORDER — SILDENAFIL CITRATE 100 MG PO TABS: 100.0000 mg | ORAL_TABLET | Freq: Every day | ORAL | 3 refills | Status: DC | PRN

## 2022-04-13 MED ORDER — TRAZODONE HCL 100 MG PO TABS: 100.0000 mg | ORAL_TABLET | Freq: Every day | ORAL | 3 refills | Status: DC

## 2022-04-13 MED ORDER — AMLODIPINE BESYLATE 10 MG PO TABS: 10.0000 mg | ORAL_TABLET | Freq: Every day | ORAL | 3 refills | Status: DC

## 2022-04-13 MED ORDER — LISINOPRIL 10 MG PO TABS: 10.0000 mg | ORAL_TABLET | Freq: Every day | ORAL | 3 refills | Status: DC

## 2022-04-13 MED ORDER — OMEPRAZOLE 20 MG PO CPDR: 20.0000 mg | DELAYED_RELEASE_CAPSULE | Freq: Every day | ORAL | 3 refills | Status: DC

## 2022-04-13 MED ORDER — GABAPENTIN 300 MG PO CAPS: 300.0000 mg | ORAL_CAPSULE | Freq: Three times a day (TID) | ORAL | 3 refills | Status: DC

## 2022-04-13 MED ORDER — METOPROLOL SUCCINATE ER 25 MG PO TB24: ORAL_TABLET | ORAL | 3 refills | Status: DC

## 2022-04-13 MED ORDER — ROSUVASTATIN CALCIUM 5 MG PO TABS: 5.0000 mg | ORAL_TABLET | Freq: Every day | ORAL | 3 refills | Status: DC

## 2022-04-13 NOTE — Progress Notes (Signed)
David Lindsey is a 67 y.o. 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[place]) 05/15/2019   S/P total knee arthroplasty, left 02/20/2018   Degenerative arthritis of left knee 11/18/2017   Left shoulder pain 08/29/2017   History of total right knee replacement (TKR) 03/25/2017   Arthralgia 02/24/2017   Edema 01/13/2017   Wellness examination 01/30/2015   Myalgia and myositis 01/28/2014   Unspecified constipation 03/13/2013   Screening for prostate cancer 01/18/2013   Diabetes mellitus with renal manifestation ([place]) 01/18/2013   Encounter for long-term (current) use of other medications 03/24/2012   HYPOKALEMIA 06/06/2008   Pancytopenia ([place]) 06/06/2008   Hypogonadism, male 12/22/2007   HYPERCHOLESTEROLEMIA 12/22/2007   ALCOHOLISM 12/22/2007   ESOPHAGITIS, REFLUX 12/22/2007   Leg pain 12/22/2007   Essential hypertension 11/10/2006   Disorder resulting from impaired renal function 11/10/2006    Current Outpatient Medications  Medication Sig Dispense Refill   Apple Cider Vinegar 600 MG CAPS Take 600 mg by mouth daily.     Biotin w/ Vitamins C & E (HAIR SKIN & NAILS GUMMIES PO) Take 1 tablet by mouth every evening.     Carboxymethylcellul-Glycerin (LUBRICATING EYE DROPS OP) Place 1 drop into both eyes daily as needed (dry eyes).      cholecalciferol (VITAMIN D3) 25 MCG (1000 UNIT) tablet Take 1,000 Units by mouth daily.     clotrimazole-betamethasone (LOTRISONE) cream APPLY  CREAM TOPICALLY TO AFFECTED AREA THREE TIMES DAILY AS NEEDED FOR  RASH 45 g 0   Coenzyme Q10 (COQ-10) 200 MG CAPS Take 200 mg by mouth every evening.      Continuous Blood Gluc Sensor (FREESTYLE LIBRE 2 SENSOR) MISC 1 Device by Does not apply route every 14 (fourteen) days. 6 each 3   Continuous Blood Gluc Sensor (FREESTYLE LIBRE 3 SENSOR)  MISC by Does not apply route.     Cyanocobalamin (B-12) 5000 MCG CAPS Take 5,000 mcg by mouth every evening.      dapagliflozin propanediol (FARXIGA) 10 MG TABS tablet Take 1 tablet (10 mg total) by mouth daily. 90 tablet 3   diclofenac Sodium (VOLTAREN) 1 % GEL Apply topically 4 (four) times daily.     docusate sodium (COLACE) 100 MG capsule Take 200 mg by mouth every evening.      glucose blood (ONETOUCH ULTRA) test strip Use as instructed Check blood sugars on waking up 3 days a week 100 each 12   glucose blood test strip Use as instructed twice a day at different times 50 each 12   Insulin Lispro Prot & Lispro (HUMALOG MIX 50/50 KWIKPEN) (50-50) 100 UNIT/ML Kwikpen Inject 30 Units into the skin daily with breakfast. And pen needles 1/day (Patient taking differently: Inject 30 Units into the skin daily with breakfast. 20 units before breakfast and 10 before dinner And pen needles 1/day) 45 mL 3   liraglutide (VICTOZA) 18 MG/3ML SOPN Inject 1.8 mg into the skin every morning. 3 mL 0   Menthol, Topical Analgesic, 8 % LIQD Apply 1 application topically 3 (three) times daily as needed (for knee pain.).      Methylcellulose, Laxative, 500 MG TABS Take 500 mg by mouth daily. Fiberwell Gummies     MM PEN NEEDLES 31G X 8 MM MISC AS DIRECTED WITH  VICTOZA 100 each 0   Multiple Vitamin (  MULTIVITAMIN WITH MINERALS) TABS tablet Take 1 tablet by mouth every evening.      Omega-3-6-9 CAPS Take 1 capsule by mouth every evening. 163m     SODIUM BICARBONATE PO Take 1 tablet by mouth 2 (two) times daily.     trolamine salicylate (ASPERCREME) 10 % cream Apply 1 application topically as needed (knee pain).     vitamin E 400 UNIT capsule Take 400 Units by mouth every evening.      amLODipine (NORVASC) 10 MG tablet Take 1 tablet (10 mg total) by mouth daily. 90 tablet 3   Ascorbic Acid (VITAMIN C) 1000 MG tablet Take 1,000 mg by mouth every evening. (Patient not taking: Reported on 04/13/2022)     gabapentin  (NEURONTIN) 300 MG capsule Take 1 capsule (300 mg total) by mouth 3 (three) times daily. 300 capsule 3   lisinopril (ZESTRIL) 10 MG tablet Take 1 tablet (10 mg total) by mouth daily. 90 tablet 3   metoprolol succinate (TOPROL-XL) 25 MG 24 hr tablet Take 1/2 tablet by mouth at bedtime 45 tablet 3   omeprazole (PRILOSEC) 20 MG capsule Take 1 capsule (20 mg total) by mouth daily. 90 capsule 3   rosuvastatin (CRESTOR) 5 MG tablet Take 1 tablet (5 mg total) by mouth daily. 90 tablet 3   sildenafil (VIAGRA) 100 MG tablet Take 1 tablet (100 mg total) by mouth daily as needed. 18 tablet 3   traZODone (DESYREL) 100 MG tablet Take 1 tablet (100 mg total) by mouth at bedtime. 100 tablet 3   No current facility-administered medications for this visit.    Allergies: Pioglitazone  Past Medical History:  Diagnosis Date   ALCOHOLISM 12/22/2007   ANEMIA, IRON DEFICIENCY 12/26/2007   Arthritis    OA   Blood transfusion without reported diagnosis    as a child   DIABETES MELLITUS, TYPE II 11/10/2006   ESOPHAGITIS, REFLUX 12/22/2007   GERD (gastroesophageal reflux disease)    HYPERCHOLESTEROLEMIA 12/22/2007   HYPERTENSION 11/10/2006   HYPOGONADISM 12/22/2007   Prob. due to ETOH   HYPOKALEMIA 06/06/2008   Leukopenia    Neuromuscular disorder (HCC)    neuropathy in feet from Diabetes   Neuropathy    Chronic Neuropathy BOTH FEET   Pancytopenia 06/06/2008   RENAL INSUFFICIENCY 11/10/2006   SEES  KIDNEY STAGE 2 CKD    Past Surgical History:  Procedure Laterality Date   APPENDECTOMY  AGE 30 OR 10   COLONOSCOPY  08-21-2004/2017   [facility]- normal/2017 [place]   COLONSCOPY  2017   ELECTROCARDIOGRAM  11/07/2006   ESOPHAGOGASTRODUODENOSCOPY  08/20/2004   EYE SURGERY Bilateral 2014   IOC FOR CATARACTS   I & D KNEE WITH POLY EXCHANGE Left 05/16/2019   Procedure: LEFT KNEE IRRIGATION AND DEBRIDEMENT WITH POLY EXCHANGE;  Surgeon: RFrederik Pear MD;  Location: [facility];  Service: Orthopedics;  Laterality: Left;    REPLACEMENT TOTAL KNEE Right 2011   TOTAL KNEE ARTHROPLASTY Left 02/20/2018   Procedure: LEFT TOTAL KNEE ARTHROPLASTY;  Surgeon: RFrederik Pear MD;  Location: [facility];  Service: Orthopedics;  Laterality: Left;   UPPER GASTROINTESTINAL ENDOSCOPY      Family History  Problem Relation Age of Onset   Cancer Mother        Colon Cancer   Colon cancer Mother 638  Colon polyps Brother    Esophageal cancer Neg Hx    Rectal cancer Neg Hx    Stomach cancer Neg Hx     Social History   Tobacco  Use   Smoking status: Never   Smokeless tobacco: Never  Substance Use Topics   Alcohol use: No    Alcohol/week: 0.0 standard drinks of alcohol    Comment: LAST USE 10 YRS AGO    Subjective:  Patient presents for yearly CPE; under care of endocrinology and nephrology; no acute concerns today; would be agreeable to getting Prevnar updated; scheduled to see eye doctor in February 2024;   Review of Systems  Constitutional: Negative.   HENT: Negative.    Eyes: Negative.   Respiratory: Negative.    Cardiovascular: Negative.   Gastrointestinal: Negative.   Genitourinary: Negative.   Musculoskeletal: Negative.   Skin: Negative.   Neurological: Negative.   Endo/Heme/Allergies: Negative.   Psychiatric/Behavioral: Negative.       Objective:  Vitals:   04/13/22 1008  BP: 118/82  Pulse: 80  Resp: 18  Temp: 98.1 F (36.7 C)  TempSrc: Oral  SpO2: 98%  Weight: 294 lb 6.4 oz (133.5 kg)  Height: _0  (1.803 m)    General: Well developed, well nourished, in no acute distress  Skin : Warm and dry.  Head: Normocephalic and atraumatic  Eyes: Sclera and conjunctiva clear; pupils round and reactive to light; extraocular movements intact  Ears: External normal; canals clear; tympanic membranes normal  Oropharynx: Pink, supple. No suspicious lesions  Neck: Supple without thyromegaly, adenopathy  Lungs: Respirations unlabored; clear to auscultation bilaterally without wheeze, rales, rhonchi  CVS exam:  normal rate and regular rhythm.  Abdomen: Soft; nontender; nondistended; normoactive bowel sounds; no masses or hepatosplenomegaly  Musculoskeletal: No deformities; no active joint inflammation  Extremities: No edema, cyanosis, clubbing  Vessels: Symmetric bilaterally  Neurologic: Alert and oriented; speech intact; face symmetrical; moves all extremities well; CNII-XII intact without focal deficit   Assessment:  1. PE (physical exam), annual   2. Hypogonadism, male   3. HYPERCHOLESTEROLEMIA   4. Prostate cancer screening   5. Need for Streptococcus pneumoniae vaccination     Plan:  Age appropriate preventive healthcare needs addressed; encouraged regular eye doctor and dental exams; encouraged regular exercise; will update labs and refills as needed today; follow-up to be determined; Prevnar 20 updated; follow up in 1 year, sooner prn.   No follow-ups on file.  Orders Placed This Encounter  Procedures   Pneumococcal conjugate vaccine 20-valent (Prevnar 20)   CBC with Differential/Platelet   Comp Met (CMET)   Lipid panel   PSA    Requested Prescriptions   Signed Prescriptions Disp Refills   amLODipine (NORVASC) 10 MG tablet 90 tablet 3    Sig: Take 1 tablet (10 mg total) by mouth daily.   lisinopril (ZESTRIL) 10 MG tablet 90 tablet 3    Sig: Take 1 tablet (10 mg total) by mouth daily.   metoprolol succinate (TOPROL-XL) 25 MG 24 hr tablet 45 tablet 3    Sig: Take 1/2 tablet by mouth at bedtime   omeprazole (PRILOSEC) 20 MG capsule 90 capsule 3    Sig: Take 1 capsule (20 mg total) by mouth daily.   rosuvastatin (CRESTOR) 5 MG tablet 90 tablet 3    Sig: Take 1 tablet (5 mg total) by mouth daily.   sildenafil (VIAGRA) 100 MG tablet 18 tablet 3    Sig: Take 1 tablet (100 mg total) by mouth daily as needed.   gabapentin (NEURONTIN) 300 MG capsule 300 capsule 3    Sig: Take 1 capsule (300 mg total) by mouth 3 (three) times daily.   traZODone (DESYREL)  100 MG tablet 100 tablet 3     Sig: Take 1 tablet (100 mg total) by mouth at bedtime.

## 2022-04-19 ENCOUNTER — Encounter: Payer: Medicare Other | Attending: Endocrinology | Admitting: Registered"

## 2022-04-19 ENCOUNTER — Encounter: Payer: Self-pay | Admitting: Registered"

## 2022-04-19 DIAGNOSIS — E1122 Type 2 diabetes mellitus with diabetic chronic kidney disease: Secondary | ICD-10-CM | POA: Insufficient documentation

## 2022-04-19 NOTE — Progress Notes (Signed)
DSME follow-up 04/19/22   Appointment Start time:  55  Appointment End time:  1505  Primary concerns today: started CGM, want to understand readings and T2D in general  Referral diagnosis: E11.65,Z79.4 (ICD-10-CM) - Uncontrolled type 2 diabetes mellitus with hyperglycemia, with long-term current use of insulin ([place])  Preferred learning style: no preference indicated Learning readiness: contemplating  NUTRITION ASSESSMENT  Anthropometrics  Wt Readings from Last 3 Encounters:  04/13/22 294 lb 6.4 oz (133.5 kg)  01/28/22 295 lb 12.8 oz (134.2 kg)  11/11/21 295 lb 12.8 oz (134.2 kg)   Clinical Medical Hx:  Uncontrolled type 2 diabetes mellitus with hyperglycemia, with long-term current use of insulin; HTN, CKD stage 3, hypersholesterolemia Medications: reviewed DM related: Pt states changes to his medications: Victoza discontinued, started Ozempic and 50/50 insulin switched to Humalog 75/25 Labs: A1c Notable Signs/Symptoms: none reported  Lifestyle & Dietary Hx Pt did not understand readings on CGM and had questions about foods that spikes blood sugar. Pt is likely eating fast food based on questions such as is food at [place] is bad.  24-Hr Dietary Recall did not complete full recall this visit First Meal: [place] yogurt (Great Value), apple, orange, granola with fruit & nuts, coffee with splenda and sugar-free creamer Snack:  Second Meal:  Snack:  Third Meal:  Snack:  Beverages:   NUTRITION INTERVENTION  Nutrition education (E-1) on the following topics:  MyPlate Interpreting CGM data  Handouts Provided Include  MyPlate Carb sheet  Learning Style & Readiness for Change Teaching method utilized: Visual & Auditory  Demonstrated degree of understanding via: Teach Back  Barriers to learning/adherence to lifestyle change: limited understanding of nutrition, diabetes medication and understanding CGM readings. Needs regular follow-up visits to address gaps in knowledge.  Goals  Established by Pt The breakfast you described sounds like you are making some healthy choices with [place] yogurt and fruit. If you blood sugar is above target at 2 hours after eating (above 180), have less fruit and add more protein to that meal next time.  Read Labels for Total Carbohydrates and when eating more than 15 grams, be sure to balance with protein.  Aim to eat out less, cook at home more often including more vegetables.  Continue using CGM and start adding notes to the app to help you understand how food and exercise can affect your blood sugar.  Return for follow-up visit for continued education on healthy eating   MONITORING & EVALUATION Dietary intake, weekly physical activity, and insulin therapy in 4-6 weeks.

## 2022-04-23 DIAGNOSIS — Z794 Long term (current) use of insulin: Secondary | ICD-10-CM | POA: Diagnosis not present

## 2022-04-23 DIAGNOSIS — E118 Type 2 diabetes mellitus with unspecified complications: Secondary | ICD-10-CM | POA: Diagnosis not present

## 2022-04-23 NOTE — Patient Instructions (Addendum)
The breakfast you described sounds like you are making some healthy choices with [place] yogurt and fruit. If you blood sugar is above target range 2 hours after eating (above 180), next time have less fruit and add more protein to that meal.  Read Labels for Total Carbohydrates and when eating more than 15 grams, be sure to balance with protein.  Aim to eat out less, cook at home more often including more vegetables.  Continue using CGM and start adding notes to the app to help you understand how food and exercise can affect your blood sugar.  Return for follow-up visit for continued education on healthy eating

## 2022-05-06 ENCOUNTER — Other Ambulatory Visit: Payer: Self-pay | Admitting: Endocrinology

## 2022-05-11 ENCOUNTER — Telehealth: Payer: Self-pay | Admitting: Endocrinology

## 2022-05-11 NOTE — Telephone Encounter (Signed)
New message    Patient drop off [employer] patient assistance program,   Copy of [facility] insurance card scan to document.

## 2022-05-12 ENCOUNTER — Other Ambulatory Visit: Payer: Self-pay | Admitting: Internal Medicine

## 2022-05-14 ENCOUNTER — Other Ambulatory Visit (INDEPENDENT_AMBULATORY_CARE_PROVIDER_SITE_OTHER): Payer: Medicare Other

## 2022-05-14 DIAGNOSIS — Z794 Long term (current) use of insulin: Secondary | ICD-10-CM

## 2022-05-14 DIAGNOSIS — E1165 Type 2 diabetes mellitus with hyperglycemia: Secondary | ICD-10-CM

## 2022-05-14 LAB — GLUCOSE, RANDOM: Glucose, Bld: 112 mg/dL — ABNORMAL HIGH (ref 70–99)

## 2022-05-14 LAB — HEMOGLOBIN A1C: Hgb A1c MFr Bld: 8.1 % — ABNORMAL HIGH (ref 4.6–6.5)

## 2022-05-17 DIAGNOSIS — E118 Type 2 diabetes mellitus with unspecified complications: Secondary | ICD-10-CM | POA: Diagnosis not present

## 2022-05-17 DIAGNOSIS — Z794 Long term (current) use of insulin: Secondary | ICD-10-CM | POA: Diagnosis not present

## 2022-05-19 ENCOUNTER — Encounter: Payer: Self-pay | Admitting: Endocrinology

## 2022-05-19 ENCOUNTER — Ambulatory Visit (INDEPENDENT_AMBULATORY_CARE_PROVIDER_SITE_OTHER): Payer: Medicare Other | Admitting: Endocrinology

## 2022-05-19 VITALS — BP 130/82 | HR 84 | Ht 71.0 in | Wt 297.8 lb

## 2022-05-19 DIAGNOSIS — Z794 Long term (current) use of insulin: Secondary | ICD-10-CM | POA: Diagnosis not present

## 2022-05-19 DIAGNOSIS — E1165 Type 2 diabetes mellitus with hyperglycemia: Secondary | ICD-10-CM

## 2022-05-19 DIAGNOSIS — N183 Chronic kidney disease, stage 3 unspecified: Secondary | ICD-10-CM | POA: Diagnosis not present

## 2022-05-19 NOTE — Progress Notes (Signed)
Patient ID: David Lindsey, male   DOB: 1955-05-18, 67 y.o.   MRN: [id number]           Reason for Appointment: Type II Diabetes follow-up   History of Present Illness   Diagnosis date: 2013  Previous history:  Non-insulin hypoglycemic drugs previously used: Metformin, Invokana, Amaryl, Prandin Metformin stopped in 2019 Farxiga started in 2021 replacing Invokana Insulin was started in 5/20, generally has been on premixed insulin  A1c range in the last few years is: 6.4-9.7  Recent history:     Non-insulin hypoglycemic drugs: Farxiga 10 mg daily, Victoza 1.8 mg daily     Insulin regimen: Humalog mix 16 units in a.m. 16 units before dinner          Side effects from medications: Actos: Edema ?  Current self management, blood sugar patterns and problems identified:  A1c is slightly better 8.1 However GMI from his freestyle libre sensor shows a result of 7% He is finally able to get the freestyle libre sensor This tends to show fairly evening blood sugars throughout the day and night with only some postprandial spikes  Because of higher readings overnight his evening dose was increased to 16 and he is taking the same dose at breakfast also Currently taking insulin right before eating Also has been regular with his Victoza and [place] He thinks he is exercising less recently No recent weight loss He was not able to get his Ozempic patient assistance application completed and is still waiting  Exercise: Walking or elliptical 2-3/7 days  Diet management: Breakfast: fruit, granola, yogurt     Hypoglycemia: Rarely   Interpretation of the freestyle libre version 3 download for the last 2 weeks shows the following  Overall blood sugars are fairly well-controlled with mildly increased blood sugars at all times Overnight blood sugars are at the lowest level averaging about 138 but are still higher between 10 PM and 2 AM No hypoglycemia overnight Postprandial readings during the  day are showing variable rise and periodically over 200 at all times Premeal blood sugars are mildly increased around 150 at lunch and dinnertime No hypoglycemia during the day Overall rising blood sugar compared to Premeal readings is very modest      CGM use % of time   2-week average/GV 156/20  Time in range      76%  % Time Above 180 24  % Time above 250   % Time Below 70      PRE-MEAL Fasting Lunch Dinner Bedtime Overall  Glucose range:       Averages: 138       POST-MEAL PC Breakfast PC Lunch PC Dinner  Glucose range:     Averages: 165 149 177    Prior    PRE-MEAL Fasting Lunch Dinner Bedtime Overall  Glucose range: 140-221    81-221  Mean/median:     152   POST-MEAL PC Breakfast PC Lunch PC Dinner  Glucose range:   ?  Mean/median:         Dietician visit: Most recent: 2000     Weight control:  Wt Readings from Last 3 Encounters:  05/19/22 297 lb 12.8 oz (135.1 kg)  04/13/22 294 lb 6.4 oz (133.5 kg)  01/28/22 295 lb 12.8 oz (134.2 kg)            Diabetes labs:  Lab Results  Component Value Date   HGBA1C 8.1 (H) 05/14/2022   HGBA1C 8.5 (H) 01/26/2022   HGBA1C 7.7 09/03/2021  Lab Results  Component Value Date   MICROALBUR 3.5 (H) 11/06/2021   [place] 95 04/13/2022   CREATININE 2.03 (H) 04/13/2022    Lab Results  Component Value Date   FRUCTOSAMINE 356 (H) 11/06/2021   FRUCTOSAMINE 348 (H) 12/29/2017   Lab Results  Component Value Date   HGB 15.3 04/13/2022      Allergies as of 05/19/2022       Reactions   Pioglitazone Swelling        Medication List        Accurate as of May 19, 2022 10:49 AM. If you have any questions, ask your nurse or doctor.          amLODipine 10 MG tablet Commonly known as: NORVASC Take 1 tablet (10 mg total) by mouth daily.   Apple Cider Vinegar 600 MG Caps Take 600 mg by mouth daily.   B-12 5000 MCG Caps Take 5,000 mcg by mouth every evening.   cholecalciferol 25 MCG (1000 UNIT)  tablet Commonly known as: VITAMIN D3 Take 1,000 Units by mouth daily.   clotrimazole-betamethasone cream Commonly known as: LOTRISONE APPLY  CREAM TOPICALLY TO AFFECTED AREA THREE TIMES DAILY AS NEEDED FOR  RASH   CoQ-10 200 MG Caps Take 200 mg by mouth every evening.   dapagliflozin propanediol 10 MG Tabs tablet Commonly known as: Farxiga Take 1 tablet (10 mg total) by mouth daily.   diclofenac Sodium 1 % Gel Commonly known as: VOLTAREN Apply topically 4 (four) times daily.   docusate sodium 100 MG capsule Commonly known as: COLACE Take 200 mg by mouth every evening.   FreeStyle Libre 3 Sensor Misc by Does not apply route.   FreeStyle Libre 2 Sensor Misc 1 Device by Does not apply route every 14 (fourteen) days.   gabapentin 300 MG capsule Commonly known as: NEURONTIN Take 1 capsule (300 mg total) by mouth 3 (three) times daily.   glucose blood test strip Use as instructed twice a day at different times   OneTouch Ultra test strip Generic drug: glucose blood Use as instructed Check blood sugars on waking up 3 days a week   HAIR SKIN & NAILS GUMMIES PO Take 1 tablet by mouth every evening.   insulin lispro protamine-lispro (75-25) 100 UNIT/ML Susp injection Commonly known as: HUMALOG 75/25 MIX Inject 16 Units into the skin daily with breakfast.   HumaLOG Mix 50/50 KwikPen (50-50) 100 UNIT/ML Kwikpen Generic drug: Insulin Lispro Prot & Lispro Inject 30 Units into the skin daily with breakfast. And pen needles 1/day   lisinopril 10 MG tablet Commonly known as: ZESTRIL Take 1 tablet (10 mg total) by mouth daily.   LUBRICATING EYE DROPS OP Place 1 drop into both eyes daily as needed (dry eyes).   Menthol (Topical Analgesic) 8 % Liqd Apply 1 application topically 3 (three) times daily as needed (for knee pain.).   Methylcellulose (Laxative) 500 MG Tabs Take 500 mg by mouth daily. Fiberwell Gummies   metoprolol succinate 25 MG 24 hr tablet Commonly known as:  TOPROL-XL Take 1/2 tablet by mouth at bedtime   MM Pen Needles 31G X 8 MM Misc Generic drug: Insulin Pen Needle AS DIRECTED WITH  VICTOZA   multivitamin with minerals Tabs tablet Take 1 tablet by mouth every evening.   Omega-3-6-9 Caps Take 1 capsule by mouth every evening. '1600mg'$    omeprazole 20 MG capsule Commonly known as: PRILOSEC Take 1 capsule (20 mg total) by mouth daily.   rosuvastatin 5 MG tablet  Commonly known as: CRESTOR Take 1 tablet (5 mg total) by mouth daily.   Semaglutide (1 MG/DOSE) 2 MG/1.5ML Sopn Inject into the skin.   sildenafil 100 MG tablet Commonly known as: VIAGRA Take 1 tablet (100 mg total) by mouth daily as needed.   SODIUM BICARBONATE PO Take 1 tablet by mouth 2 (two) times daily.   traZODone 100 MG tablet Commonly known as: DESYREL Take 1 tablet (100 mg total) by mouth at bedtime.   trolamine salicylate 10 % cream Commonly known as: ASPERCREME Apply 1 application topically as needed (knee pain).   Victoza 18 MG/3ML Sopn Generic drug: liraglutide Inject 1.8 mg into the skin every morning.   vitamin C 1000 MG tablet Take 1,000 mg by mouth every evening.   vitamin E 180 MG (400 UNITS) capsule Take 400 Units by mouth every evening.        Allergies:  Allergies  Allergen Reactions   Pioglitazone Swelling    Past Medical History:  Diagnosis Date   ALCOHOLISM 12/22/2007   ANEMIA, IRON DEFICIENCY 12/26/2007   Arthritis    OA   Blood transfusion without reported diagnosis    as a child   DIABETES MELLITUS, TYPE II 11/10/2006   ESOPHAGITIS, REFLUX 12/22/2007   GERD (gastroesophageal reflux disease)    HYPERCHOLESTEROLEMIA 12/22/2007   HYPERTENSION 11/10/2006   HYPOGONADISM 12/22/2007   Prob. due to ETOH   HYPOKALEMIA 06/06/2008   Leukopenia    Neuromuscular disorder (HCC)    neuropathy in feet from Diabetes   Neuropathy    Chronic Neuropathy BOTH FEET   Pancytopenia 06/06/2008   RENAL INSUFFICIENCY 11/10/2006   [facility] [place]  KIDNEY STAGE 2 CKD    Past Surgical History:  Procedure Laterality Date   APPENDECTOMY  AGE 58 OR 10   COLONOSCOPY  08-21-2004/2017   [facility]- normal/2017 [place]   COLONSCOPY  2017   ELECTROCARDIOGRAM  11/07/2006   ESOPHAGOGASTRODUODENOSCOPY  08/20/2004   EYE SURGERY Bilateral 2014   IOC FOR CATARACTS   I & D KNEE WITH POLY EXCHANGE Left 05/16/2019   Procedure: LEFT KNEE IRRIGATION AND DEBRIDEMENT WITH POLY EXCHANGE;  Surgeon: Frederik Pear, MD;  Location: [facility];  Service: Orthopedics;  Laterality: Left;   REPLACEMENT TOTAL KNEE Right 2011   TOTAL KNEE ARTHROPLASTY Left 02/20/2018   Procedure: LEFT TOTAL KNEE ARTHROPLASTY;  Surgeon: Frederik Pear, MD;  Location: [facility];  Service: Orthopedics;  Laterality: Left;   UPPER GASTROINTESTINAL ENDOSCOPY      Family History  Problem Relation Age of Onset   Cancer Mother        Colon Cancer   Colon cancer Mother 81   Colon polyps Brother    Esophageal cancer Neg Hx    Rectal cancer Neg Hx    Stomach cancer Neg Hx     Social History:  reports that he has never smoked. He has never used smokeless tobacco. He reports that he does not drink alcohol and does not use drugs.  Review of Systems:  Last diabetic eye exam date 10/22  Last foot exam date: 5/23  Symptoms of neuropathy: Has tingling controlled with gabapentin  Hypertension:   Treatment includes lisinopril  BP Readings from Last 3 Encounters:  05/19/22 130/82  04/13/22 118/82  01/28/22 116/74   He has had chronic kidney disease without microalbuminuria  Lab Results  Component Value Date   CREATININE 2.03 (H) 04/13/2022   CREATININE 1.92 (H) 11/06/2021   CREATININE 2.01 (H) 04/02/2021    Lipid management: Lipids  controlled with Crestor 5 mg daily    Lab Results  Component Value Date   CHOL 157 04/13/2022   CHOL 143 04/02/2021   CHOL 189 04/01/2020   Lab Results  Component Value Date   HDL 42.10 04/13/2022   HDL 38.60 (L) 04/02/2021   HDL 38.50 (L) 04/01/2020    Lab Results  Component Value Date   LDLCALC 95 04/13/2022   LDLCALC 81 04/02/2021   LDLCALC 124 (H) 04/01/2020   Lab Results  Component Value Date   TRIG 99.0 04/13/2022   TRIG 119.0 04/02/2021   TRIG 135.0 04/01/2020   Lab Results  Component Value Date   CHOLHDL 4 04/13/2022   CHOLHDL 4 04/02/2021   CHOLHDL 5 04/01/2020   No results found for: "LDLDIRECT"   Examination:   BP 130/82 (BP Location: Left Arm, Patient Position: Sitting, Cuff Size: Normal)   Pulse 84   Ht '5\' 11"'$  (1.803 m)   Wt 297 lb 12.8 oz (135.1 kg)   SpO2 96%   BMI 41.53 kg/m   Body mass index is 41.53 kg/m.    ASSESSMENT/ PLAN:    Diabetes type 2 with severe obesity:   Current regimen: Humalog mix insulin 16 units twice per day, Farxiga, Victoza 1.8  See history of present illness for detailed discussion of current diabetes management, blood sugar patterns and problems identified  A1c is 8.1  Blood glucose control is fair with GMI 7% For simplicity and economy he is only on premixed insulin Although his blood sugars are mildly high at all times he does have some variability and not clear if his insulin dose can be increased without hypoglycemia Likely can do better with more exercise and weight loss  Recommendations:  Will look into the patient assistance program for Ozempic and he will follow-up on this Likely can get better control and weight loss with Ozempic compared to Victoza In the meantime he will try to make sure he has balanced meals and add more protein and less carbohydrates like fruit at breakfast, explained that adding a protein like a boiled egg will help  CKD: Continue follow-up with nephrologist  Hypercholesterolemia: LDL is excellent with levels below  100  There are no Patient Instructions on file for this visit.  Total visit time for evaluation and management and counseling = 30 minutes  Elayne Snare 05/19/2022, 10:49 AM

## 2022-05-20 ENCOUNTER — Encounter: Payer: Self-pay | Admitting: Endocrinology

## 2022-05-20 DIAGNOSIS — H02834 Dermatochalasis of left upper eyelid: Secondary | ICD-10-CM | POA: Diagnosis not present

## 2022-05-20 DIAGNOSIS — Z961 Presence of intraocular lens: Secondary | ICD-10-CM | POA: Diagnosis not present

## 2022-05-20 DIAGNOSIS — E119 Type 2 diabetes mellitus without complications: Secondary | ICD-10-CM | POA: Diagnosis not present

## 2022-05-20 DIAGNOSIS — H02831 Dermatochalasis of right upper eyelid: Secondary | ICD-10-CM | POA: Diagnosis not present

## 2022-05-20 DIAGNOSIS — H26493 Other secondary cataract, bilateral: Secondary | ICD-10-CM | POA: Diagnosis not present

## 2022-05-20 LAB — HM DIABETES EYE EXAM

## 2022-05-26 DIAGNOSIS — E871 Hypo-osmolality and hyponatremia: Secondary | ICD-10-CM | POA: Diagnosis not present

## 2022-05-26 DIAGNOSIS — E872 Acidosis, unspecified: Secondary | ICD-10-CM | POA: Diagnosis not present

## 2022-05-26 DIAGNOSIS — I129 Hypertensive chronic kidney disease with stage 1 through stage 4 chronic kidney disease, or unspecified chronic kidney disease: Secondary | ICD-10-CM | POA: Diagnosis not present

## 2022-05-26 DIAGNOSIS — N1832 Chronic kidney disease, stage 3b: Secondary | ICD-10-CM | POA: Diagnosis not present

## 2022-05-26 DIAGNOSIS — R609 Edema, unspecified: Secondary | ICD-10-CM | POA: Diagnosis not present

## 2022-05-26 DIAGNOSIS — D631 Anemia in chronic kidney disease: Secondary | ICD-10-CM | POA: Diagnosis not present

## 2022-06-04 ENCOUNTER — Telehealth: Payer: Self-pay

## 2022-06-04 NOTE — Telephone Encounter (Signed)
Pt advised patient assistance delivered and ready for pick up.  4 boxes of Ozempic.

## 2022-06-04 NOTE — Telephone Encounter (Signed)
Patient stopped by and picked up medication

## 2022-06-16 DIAGNOSIS — Z794 Long term (current) use of insulin: Secondary | ICD-10-CM | POA: Diagnosis not present

## 2022-06-16 DIAGNOSIS — E118 Type 2 diabetes mellitus with unspecified complications: Secondary | ICD-10-CM | POA: Diagnosis not present

## 2022-06-25 ENCOUNTER — Other Ambulatory Visit: Payer: Self-pay | Admitting: Family

## 2022-07-07 ENCOUNTER — Other Ambulatory Visit: Payer: Self-pay | Admitting: Endocrinology

## 2022-07-16 DIAGNOSIS — E118 Type 2 diabetes mellitus with unspecified complications: Secondary | ICD-10-CM | POA: Diagnosis not present

## 2022-07-16 DIAGNOSIS — Z794 Long term (current) use of insulin: Secondary | ICD-10-CM | POA: Diagnosis not present

## 2022-08-10 DIAGNOSIS — Z794 Long term (current) use of insulin: Secondary | ICD-10-CM | POA: Diagnosis not present

## 2022-08-10 DIAGNOSIS — E118 Type 2 diabetes mellitus with unspecified complications: Secondary | ICD-10-CM | POA: Diagnosis not present

## 2022-08-17 ENCOUNTER — Ambulatory Visit (INDEPENDENT_AMBULATORY_CARE_PROVIDER_SITE_OTHER): Payer: Medicare Other | Admitting: *Deleted

## 2022-08-17 ENCOUNTER — Telehealth: Payer: Self-pay

## 2022-08-17 VITALS — BP 136/79 | HR 94 | Ht 71.0 in | Wt 288.6 lb

## 2022-08-17 DIAGNOSIS — Z0000 Encounter for general adult medical examination without abnormal findings: Secondary | ICD-10-CM | POA: Diagnosis not present

## 2022-08-17 DIAGNOSIS — E1122 Type 2 diabetes mellitus with diabetic chronic kidney disease: Secondary | ICD-10-CM

## 2022-08-17 DIAGNOSIS — E291 Testicular hypofunction: Secondary | ICD-10-CM

## 2022-08-17 NOTE — Telephone Encounter (Signed)
Orders Placed This Encounter  Procedures   Comprehensive metabolic panel    Standing Status:   Future    Standing Expiration Date:   02/17/2023   Hemoglobin A1c    Standing Status:   Future    Standing Expiration Date:   02/17/2023   Microalbumin / creatinine urine ratio    Standing Status:   Future    Standing Expiration Date:   02/17/2023   Lipid panel    Standing Status:   Future    Standing Expiration Date:   02/17/2023   Testosterone,Free and Total    Standing Status:   Future    Standing Expiration Date:   02/17/2023   Prolactin    Standing Status:   Future    Standing Expiration Date:   02/17/2023   CBC    Standing Status:   Future    Standing Expiration Date:   02/17/2023   Testosterone Free with SHBG    Standing Status:   Future    Standing Expiration Date:   02/17/2023   Testosterone Total,Free,Bio, Males-([facility])    Standing Status:   Future    Standing Expiration Date:   02/17/2023

## 2022-08-17 NOTE — Patient Instructions (Signed)
[name] , Thank you for taking time to come for your Medicare Wellness Visit. I appreciate your ongoing commitment to your health goals. Please review the following plan we discussed and let me know if I can assist you in the future.   These are the goals we discussed:  Goals   None     This is a list of the screening recommended for you and due dates:  Health Maintenance  Topic Date Due   COVID-19 Vaccine (6 - 2023-24 season) 03/15/2022   Complete foot exam   09/04/2022   Yearly kidney health urinalysis for diabetes  11/07/2022   Flu Shot  11/11/2022   Hemoglobin A1C  11/12/2022   Yearly kidney function blood test for diabetes  04/14/2023   Eye exam for diabetics  05/21/2023   Medicare Annual Wellness Visit  08/17/2023   Colon Cancer Screening  06/25/2025   DTaP/Tdap/Td vaccine (3 - Td or Tdap) 02/01/2026   Pneumonia Vaccine  Completed   Hepatitis C Screening: USPSTF Recommendation to screen - Ages 47-79 yo.  Completed   Zoster (Shingles) Vaccine  Completed   HPV Vaccine  Aged Out     Next appointment: Follow up in one year for your annual wellness visit.   Preventive Care 31 Years and Older, Male Preventive care refers to lifestyle choices and visits with your health care provider that can promote health and wellness. What does preventive care include? A yearly physical exam. This is also called an annual well check. Dental exams once or twice a year. Routine eye exams. Ask your health care provider how often you should have your eyes checked. Personal lifestyle choices, including: Daily care of your teeth and gums. Regular physical activity. Eating a healthy diet. Avoiding tobacco and drug use. Limiting alcohol use. Practicing safe sex. Taking low doses of aspirin every day. Taking vitamin and mineral supplements as recommended by your health care provider. What happens during an annual well check? The services and screenings done by your health care provider  during your annual well check will depend on your age, overall health, lifestyle risk factors, and family history of disease. Counseling  Your health care provider may ask you questions about your: Alcohol use. Tobacco use. Drug use. Emotional well-being. Home and relationship well-being. Sexual activity. Eating habits. History of falls. Memory and ability to understand (cognition). Work and work Astronomer. Screening  You may have the following tests or measurements: Height, weight, and BMI. Blood pressure. Lipid and cholesterol levels. These may be checked every 5 years, or more frequently if you are over 47 years old. Skin check. Lung cancer screening. You may have this screening every year starting at age 84 if you have a 30-pack-year history of smoking and currently smoke or have quit within the past 15 years. Fecal occult blood test (FOBT) of the stool. You may have this test every year starting at age 79. Flexible sigmoidoscopy or colonoscopy. You may have a sigmoidoscopy every 5 years or a colonoscopy every 10 years starting at age 24. Prostate cancer screening. Recommendations will vary depending on your family history and other risks. Hepatitis C blood test. Hepatitis B blood test. Sexually transmitted disease (STD) testing. Diabetes screening. This is done by checking your blood sugar (glucose) after you have not eaten for a while (fasting). You may have this done every 1-3 years. Abdominal aortic aneurysm (AAA) screening. You may need this if you are a current or former smoker. Osteoporosis. You may be screened starting  at age 31 if you are at high risk. Talk with your health care provider about your test results, treatment options, and if necessary, the need for more tests. Vaccines  Your health care provider may recommend certain vaccines, such as: Influenza vaccine. This is recommended every year. Tetanus, diphtheria, and acellular pertussis (Tdap, Td) vaccine. You  may need a Td booster every 10 years. Zoster vaccine. You may need this after age 55. Pneumococcal 13-valent conjugate (PCV13) vaccine. One dose is recommended after age 70. Pneumococcal polysaccharide (PPSV23) vaccine. One dose is recommended after age 71. Talk to your health care provider about which screenings and vaccines you need and how often you need them. This information is not intended to replace advice given to you by your health care provider. Make sure you discuss any questions you have with your health care provider. Document Released: 04/25/2015 Document Revised: 12/17/2015 Document Reviewed: 01/28/2015 Elsevier Interactive Patient Education  2017 [place] Prevention in the Home Falls can cause injuries. They can happen to people of all ages. There are many things you can do to make your home safe and to help prevent falls. What can I do on the outside of my home? Regularly fix the edges of walkways and driveways and fix any cracks. Remove anything that might make you trip as you walk through a door, such as a raised step or threshold. Trim any bushes or trees on the path to your home. Use bright outdoor lighting. Clear any walking paths of anything that might make someone trip, such as rocks or tools. Regularly check to see if handrails are loose or broken. Make sure that both sides of any steps have handrails. Any raised decks and porches should have guardrails on the edges. Have any leaves, snow, or ice cleared regularly. Use sand or salt on walking paths during winter. Clean up any spills in your garage right away. This includes oil or grease spills. What can I do in the bathroom? Use night lights. Install grab bars by the toilet and in the tub and shower. Do not use towel bars as grab bars. Use non-skid mats or decals in the tub or shower. If you need to sit down in the shower, use a plastic, non-slip stool. Keep the floor dry. Clean up any water that spills  on the floor as soon as it happens. Remove soap buildup in the tub or shower regularly. Attach bath mats securely with double-sided non-slip rug tape. Do not have throw rugs and other things on the floor that can make you trip. What can I do in the bedroom? Use night lights. Make sure that you have a light by your bed that is easy to reach. Do not use any sheets or blankets that are too big for your bed. They should not hang down onto the floor. Have a firm chair that has side arms. You can use this for support while you get dressed. Do not have throw rugs and other things on the floor that can make you trip. What can I do in the kitchen? Clean up any spills right away. Avoid walking on wet floors. Keep items that you use a lot in easy-to-reach places. If you need to reach something above you, use a strong step stool that has a grab bar. Keep electrical cords out of the way. Do not use floor polish or wax that makes floors slippery. If you must use wax, use non-skid floor wax. Do not have throw rugs  and other things on the floor that can make you trip. What can I do with my stairs? Do not leave any items on the stairs. Make sure that there are handrails on both sides of the stairs and use them. Fix handrails that are broken or loose. Make sure that handrails are as long as the stairways. Check any carpeting to make sure that it is firmly attached to the stairs. Fix any carpet that is loose or worn. Avoid having throw rugs at the top or bottom of the stairs. If you do have throw rugs, attach them to the floor with carpet tape. Make sure that you have a light switch at the top of the stairs and the bottom of the stairs. If you do not have them, ask someone to add them for you. What else can I do to help prevent falls? Wear shoes that: Do not have high heels. Have rubber bottoms. Are comfortable and fit you well. Are closed at the toe. Do not wear sandals. If you use a stepladder: Make  sure that it is fully opened. Do not climb a closed stepladder. Make sure that both sides of the stepladder are locked into place. Ask someone to hold it for you, if possible. Clearly mark and make sure that you can see: Any grab bars or handrails. First and last steps. Where the edge of each step is. Use tools that help you move around (mobility aids) if they are needed. These include: Canes. Walkers. Scooters. Crutches. Turn on the lights when you go into a dark area. Replace any light bulbs as soon as they burn out. Set up your furniture so you have a clear path. Avoid moving your furniture around. If any of your floors are uneven, fix them. If there are any pets around you, be aware of where they are. Review your medicines with your doctor. Some medicines can make you feel dizzy. This can increase your chance of falling. Ask your doctor what other things that you can do to help prevent falls. This information is not intended to replace advice given to you by your health care provider. Make sure you discuss any questions you have with your health care provider. Document Released: 01/23/2009 Document Revised: 09/04/2015 Document Reviewed: 05/03/2014 Elsevier Interactive Patient Education  2017 [employer].

## 2022-08-17 NOTE — Progress Notes (Signed)
Subjective:   David Lindsey is a 67 y.o. male who presents for Medicare Annual/Subsequent preventive examination.  Review of Systems     Cardiac Risk Factors include: advanced age (>41men, >7 women);diabetes mellitus;hypertension;male gender;obesity (BMI >30kg/m2)     Objective:    Today's Vitals   08/17/22 1019  BP: 136/79  Pulse: 94  Weight: 288 lb 9.6 oz (130.9 kg)  Height: 5\' 11"  (1.803 m)   Body mass index is 40.25 kg/m.     08/17/2022   10:21 AM 08/14/2021   10:58 AM 04/02/2021   11:30 AM 05/16/2019   10:58 AM 05/15/2019    1:33 PM 05/07/2019    4:59 PM 02/20/2018   10:53 AM  Advanced Directives  Does Patient Have a Medical Advance Directive? No No No No No No No  Would patient like information on creating a medical advance directive? No - Patient declined No - Patient declined Yes (MAU/Ambulatory/Procedural Areas - Information given) No - Patient declined No - Patient declined Yes (ED - Information included in AVS) No - Patient declined    Current Medications (verified) Outpatient Encounter Medications as of 08/17/2022  Medication Sig   amLODipine (NORVASC) 10 MG tablet Take 1 tablet (10 mg total) by mouth daily.   Apple Cider Vinegar 600 MG CAPS Take 600 mg by mouth daily.   Biotin w/ Vitamins C & E (HAIR SKIN & NAILS GUMMIES PO) Take 1 tablet by mouth every evening.   Carboxymethylcellul-Glycerin (LUBRICATING EYE DROPS OP) Place 1 drop into both eyes daily as needed (dry eyes).    cholecalciferol (VITAMIN D3) 25 MCG (1000 UNIT) tablet Take 1,000 Units by mouth daily.   clotrimazole-betamethasone (LOTRISONE) cream APPLY  CREAM TOPICALLY TO AFFECTED AREA THREE TIMES DAILY AS NEEDED FOR  RASH   Coenzyme Q10 (COQ-10) 200 MG CAPS Take 200 mg by mouth every evening.    Continuous Blood Gluc Sensor (FREESTYLE LIBRE 2 SENSOR) MISC 1 Device by Does not apply route every 14 (fourteen) days.   Continuous Blood Gluc Sensor (FREESTYLE LIBRE 3 SENSOR) MISC by Does not apply route.    Cyanocobalamin (B-12) 5000 MCG CAPS Take 5,000 mcg by mouth every evening.    dapagliflozin propanediol (FARXIGA) 10 MG TABS tablet Take 1 tablet (10 mg total) by mouth daily.   diclofenac Sodium (VOLTAREN) 1 % GEL Apply topically 4 (four) times daily.   docusate sodium (COLACE) 100 MG capsule Take 200 mg by mouth every evening.    gabapentin (NEURONTIN) 300 MG capsule Take 1 capsule (300 mg total) by mouth 3 (three) times daily.   glucose blood (ONETOUCH ULTRA) test strip Use as instructed Check blood sugars on waking up 3 days a week   glucose blood test strip Use as instructed twice a day at different times   insulin lispro protamine-lispro (HUMALOG 75/25 MIX) (75-25) 100 UNIT/ML SUSP injection Inject 16 Units into the skin daily with breakfast.   lisinopril (ZESTRIL) 10 MG tablet Take 1 tablet (10 mg total) by mouth daily.   Menthol, Topical Analgesic, 8 % LIQD Apply 1 application topically 3 (three) times daily as needed (for knee pain.).    Methylcellulose, Laxative, 500 MG TABS Take 500 mg by mouth daily. Fiberwell Gummies   metoprolol succinate (TOPROL-XL) 25 MG 24 hr tablet Take 1/2 tablet by mouth at bedtime   MM PEN NEEDLES 31G X 8 MM MISC AS DIRECTED WITH  VICTOZA   Multiple Vitamin (MULTIVITAMIN WITH MINERALS) TABS tablet Take 1 tablet by mouth every  evening.    Omega-3-6-9 CAPS Take 1 capsule by mouth every evening. 1600mg    omeprazole (PRILOSEC) 20 MG capsule Take 1 capsule (20 mg total) by mouth daily.   rosuvastatin (CRESTOR) 5 MG tablet Take 1 tablet (5 mg total) by mouth daily.   Semaglutide, 1 MG/DOSE, 2 MG/1.5ML SOPN Inject into the skin.   sildenafil (VIAGRA) 100 MG tablet Take 1 tablet (100 mg total) by mouth daily as needed.   SODIUM BICARBONATE PO Take 1 tablet by mouth 2 (two) times daily.   traZODone (DESYREL) 100 MG tablet Take 1 tablet (100 mg total) by mouth at bedtime.   trolamine salicylate (ASPERCREME) 10 % cream Apply 1 application topically as needed (knee  pain).   vitamin E 400 UNIT capsule Take 400 Units by mouth every evening.    [DISCONTINUED] Ascorbic Acid (VITAMIN C) 1000 MG tablet Take 1,000 mg by mouth every evening.   [DISCONTINUED] liraglutide (VICTOZA) 18 MG/3ML SOPN Inject 1.8 mg into the skin every morning.   No facility-administered encounter medications on file as of 08/17/2022.    Allergies (verified) Pioglitazone   History: Past Medical History:  Diagnosis Date   ALCOHOLISM 12/22/2007   ANEMIA, IRON DEFICIENCY 12/26/2007   Arthritis    OA   Blood transfusion without reported diagnosis    as a child   DIABETES MELLITUS, TYPE II 11/10/2006   ESOPHAGITIS, REFLUX 12/22/2007   GERD (gastroesophageal reflux disease)    HYPERCHOLESTEROLEMIA 12/22/2007   HYPERTENSION 11/10/2006   HYPOGONADISM 12/22/2007   Prob. due to ETOH   HYPOKALEMIA 06/06/2008   Leukopenia    Neuromuscular disorder (HCC)    neuropathy in feet from Diabetes   Neuropathy    Chronic Neuropathy BOTH FEET   Pancytopenia 06/06/2008   RENAL INSUFFICIENCY 11/10/2006   SEES [facility] STAGE 2 CKD   Past Surgical History:  Procedure Laterality Date   APPENDECTOMY  AGE 81 OR 10   COLONOSCOPY  08-21-2004/2017   [facility]- normal/2017 [place]   COLONSCOPY  2017   ELECTROCARDIOGRAM  11/07/2006   ESOPHAGOGASTRODUODENOSCOPY  08/20/2004   EYE SURGERY Bilateral 2014   IOC FOR CATARACTS   I & D KNEE WITH POLY EXCHANGE Left 05/16/2019   Procedure: LEFT KNEE IRRIGATION AND DEBRIDEMENT WITH POLY EXCHANGE;  Surgeon: Gean Birchwood, MD;  Location: [facility];  Service: Orthopedics;  Laterality: Left;   REPLACEMENT TOTAL KNEE Right 2011   TOTAL KNEE ARTHROPLASTY Left 02/20/2018   Procedure: LEFT TOTAL KNEE ARTHROPLASTY;  Surgeon: Gean Birchwood, MD;  Location: [facility];  Service: Orthopedics;  Laterality: Left;   UPPER GASTROINTESTINAL ENDOSCOPY     Family History  Problem Relation Age of Onset   Cancer Mother        Colon Cancer   Colon cancer Mother 32   Colon polyps Brother     Esophageal cancer Neg Hx    Rectal cancer Neg Hx    Stomach cancer Neg Hx    Social History   Socioeconomic History   Marital status: Married    Spouse name: Not on file   Number of children: Not on file   Years of education: Not on file   Highest education level: Not on file  Occupational History   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  Tobacco Use   Smoking status: Never   Smokeless tobacco: Never  Vaping Use   Vaping Use: Never used  Substance and Sexual Activity   Alcohol use: No    Alcohol/week: 0.0 standard  drinks of alcohol    Comment: LAST USE 10 YRS AGO   Drug use: No   Sexual activity: Not on file  Other Topics Concern   Not on file  Social History Narrative   Not on file   Social Determinants of Health   Financial Resource Strain: Low Risk  (08/14/2021)   Overall Financial Resource Strain (CARDIA)    Difficulty of Paying Living Expenses: Not hard at all  Food Insecurity: No Food Insecurity (08/17/2022)   Hunger Vital Sign    Worried About Running Out of Food in the Last Year: Never true    Ran Out of Food in the Last Year: Never true  Transportation Needs: No Transportation Needs (08/17/2022)   PRAPARE - Administrator, Civil Service (Medical): No    Lack of Transportation (Non-Medical): No  Physical Activity: Not on file  Stress: No Stress Concern Present (08/14/2021)   [employer] of Occupational Health - Occupational Stress Questionnaire    Feeling of Stress : Not at all  Social Connections: Moderately Integrated (08/14/2021)   Social Connection and Isolation Panel [NHANES]    Frequency of Communication with Friends and Family: More than three times a week    Frequency of Social Gatherings with Friends and Family: More than three times a week    Attends Religious Services: More than 4 times per year    Active Member of [employer] or Organizations: No    Attends Engineer, structural: Never    Marital Status: Married     Tobacco Counseling Counseling given: Not Answered   Clinical Intake:  Pre-visit preparation completed: Yes  Pain : No/denies pain     BMI - recorded: 40.25 Nutritional Status: BMI > 30  Obese Nutritional Risks: None Diabetes: Yes CBG done?: No Did pt. bring in CBG monitor from home?: No  How often do you need to have someone help you when you read instructions, pamphlets, or other written materials from your doctor or pharmacy?: 1 - Never  Activities of Daily Living    08/17/2022   10:24 AM  In your present state of health, do you have any difficulty performing the following activities:  Hearing? 0  Vision? 0  Difficulty concentrating or making decisions? 0  Walking or climbing stairs? 1  Dressing or bathing? 0  Doing errands, shopping? 0  Preparing Food and eating ? N  Using the Toilet? N  In the past six months, have you accidently leaked urine? N  Do you have problems with loss of bowel control? N  Managing your Medications? N  Managing your Finances? N  Housekeeping or managing your Housekeeping? N    Patient Care Team: Olive Bass, FNP as PCP - General (Internal Medicine)  Indicate any recent Medical Services you may have received from other than [facility] providers in the past year (date may be approximate).     Assessment:   This is a routine wellness examination for [place].  Hearing/Vision screen No results found.  Dietary issues and exercise activities discussed: Current Exercise Habits: Home exercise routine, Type of exercise: walking, Time (Minutes): 40, Frequency (Times/Week): 2, Weekly Exercise (Minutes/Week): 80, Exercise limited by: None identified   Goals Addressed   None    Depression Screen    08/17/2022   10:23 AM 04/13/2022   10:12 AM 01/22/2022   10:01 AM 08/14/2021   11:02 AM 04/02/2021   10:56 AM 03/10/2021    3:08 PM 04/23/2020   10:09 AM  PHQ 2/9 Scores  PHQ - 2 Score 0 0 0 0 0 0 0    Fall Risk    08/17/2022   10:22  AM 04/13/2022   10:12 AM 08/14/2021   11:01 AM 04/02/2021   10:55 AM 03/31/2021    3:50 PM  Fall Risk   Falls in the past year? 0 0 0 0 0  Number falls in past yr: 0 0 0 0 0  Injury with Fall? 0 0 0 0 0  Risk for fall due to : No Fall Risks No Fall Risks No Fall Risks No Fall Risks No Fall Risks  Follow up Falls evaluation completed Falls evaluation completed Falls evaluation completed Falls evaluation completed Falls evaluation completed    FALL RISK PREVENTION PERTAINING TO THE HOME:  Any stairs in or around the home? Yes  If so, are there any without handrails? Yes  Home free of loose throw rugs in walkways, pet beds, electrical cords, etc? Yes  Adequate lighting in your home to reduce risk of falls? Yes   ASSISTIVE DEVICES UTILIZED TO PREVENT FALLS:  Life alert? No  Use of a cane, walker or w/c? No  Grab bars in the bathroom? No  Shower chair or bench in shower? No  Elevated toilet seat or a handicapped toilet? No   TIMED UP AND GO:  Was the test performed? Yes .  Length of time to ambulate 10 feet: 7 sec.   Gait steady and fast without use of assistive device  Cognitive Function:        08/17/2022   10:26 AM 08/14/2021   11:26 AM 08/14/2021   11:16 AM  6CIT Screen  What Year? 0 points 0 points 0 points  What month? 0 points 0 points 0 points  What time? 0 points 0 points 0 points  Count back from 20 0 points 0 points 0 points  Months in reverse 0 points 2 points 2 points  Repeat phrase 0 points 0 points 0 points  Total Score 0 points 2 points 2 points    Immunizations Immunization History  Administered Date(s) Administered   Fluad Quad(high Dose 65+) 01/06/2021   Influenza Split 01/05/2012   Influenza Whole 01/06/2009, 12/10/2010   Influenza,inj,Quad PF,6+ Mos 01/18/2013, 01/28/2014, 12/13/2014, 02/02/2016, 12/29/2017, 12/06/2018, 12/07/2019   Influenza-Unspecified 01/13/2017, 01/18/2022   PFIZER(Purple Top)SARS-COV-2 Vaccination 04/25/2019, 05/16/2019,  07/17/2020, 01/13/2021   PNEUMOCOCCAL CONJUGATE-20 04/13/2022   Pfizer Covid-19 Vaccine Bivalent Booster 63yrs & up 01/06/2021   Pneumococcal Conjugate-13 02/02/2016   Pneumococcal Polysaccharide-23 04/17/2007, 02/24/2017   Respiratory Syncytial Virus Vaccine,Recomb Aduvanted(Arexvy) 01/18/2022   Td 09/18/2004   Tdap 02/02/2016   Unspecified SARS-COV-2 Vaccination 01/18/2022   Zoster Recombinat (Shingrix) 05/02/2018, 10/04/2018   Zoster, Live 01/28/2014    TDAP status: Up to date  Flu Vaccine status: Up to date  Pneumococcal vaccine status: Up to date  Covid-19 vaccine status: Information provided on how to obtain vaccines.   Qualifies for Shingles Vaccine? Yes   Zostavax completed Yes   Shingrix Completed?: Yes  Screening Tests Health Maintenance  Topic Date Due   COVID-19 Vaccine (6 - 2023-24 season) 03/15/2022   Medicare Annual Wellness (AWV)  08/15/2022   FOOT EXAM  09/04/2022   Diabetic kidney evaluation - Urine ACR  11/07/2022   INFLUENZA VACCINE  11/11/2022   HEMOGLOBIN A1C  11/12/2022   Diabetic kidney evaluation - eGFR measurement  04/14/2023   OPHTHALMOLOGY EXAM  05/21/2023   COLONOSCOPY (Pts 45-44yrs Insurance coverage will need to be  confirmed)  06/25/2025   DTaP/Tdap/Td (3 - Td or Tdap) 02/01/2026   Pneumonia Vaccine 58+ Years old  Completed   Hepatitis C Screening  Completed   Zoster Vaccines- Shingrix  Completed   HPV VACCINES  Aged Out    Health Maintenance  Health Maintenance Due  Topic Date Due   COVID-19 Vaccine (6 - 2023-24 season) 03/15/2022   Medicare Annual Wellness (AWV)  08/15/2022    Colorectal cancer screening: Type of screening: Colonoscopy. Completed 06/25/20. Repeat every 5 years  Lung Cancer Screening: (Low Dose CT Chest recommended if Age 88-80 years, 30 pack-year currently smoking OR have quit w/in 15years.) does not qualify.    Additional Screening:  Hepatitis C Screening: does qualify; Completed 02/02/16  Vision Screening:  Recommended annual ophthalmology exams for early detection of glaucoma and other disorders of the eye. Is the patient up to date with their annual eye exam?  Yes  Who is the provider or what is the name of the office in which the patient attends annual eye exams? [facility]  If pt is not established with a provider, would they like to be referred to a provider to establish care? No .   Dental Screening: Recommended annual dental exams for proper oral hygiene  Community Resource Referral / Chronic Care Management: CRR required this visit?  No   CCM required this visit?  No      Plan:     I have personally reviewed and noted the following in the patient's chart:   Medical and social history Use of alcohol, tobacco or illicit drugs  Current medications and supplements including opioid prescriptions. Patient is not currently taking opioid prescriptions. Functional ability and status Nutritional status Physical activity Advanced directives List of other physicians Hospitalizations, surgeries, and ER visits in previous 12 months Vitals Screenings to include cognitive, depression, and falls Referrals and appointments  In addition, I have reviewed and discussed with patient certain preventive protocols, quality metrics, and best practice recommendations. A written personalized care plan for preventive services as well as general preventive health recommendations were provided to patient.     Donne Anon, [place]   08/17/2022   Nurse Notes: None

## 2022-08-18 ENCOUNTER — Encounter: Payer: Medicare Other | Attending: Endocrinology | Admitting: Registered"

## 2022-08-18 ENCOUNTER — Other Ambulatory Visit (INDEPENDENT_AMBULATORY_CARE_PROVIDER_SITE_OTHER): Payer: Medicare Other

## 2022-08-18 DIAGNOSIS — E291 Testicular hypofunction: Secondary | ICD-10-CM

## 2022-08-18 DIAGNOSIS — E1122 Type 2 diabetes mellitus with diabetic chronic kidney disease: Secondary | ICD-10-CM | POA: Diagnosis not present

## 2022-08-18 DIAGNOSIS — N181 Chronic kidney disease, stage 1: Secondary | ICD-10-CM

## 2022-08-18 LAB — COMPREHENSIVE METABOLIC PANEL
ALT: 26 U/L (ref 0–53)
AST: 21 U/L (ref 0–37)
Albumin: 4.2 g/dL (ref 3.5–5.2)
Alkaline Phosphatase: 56 U/L (ref 39–117)
BUN: 19 mg/dL (ref 6–23)
CO2: 25 mEq/L (ref 19–32)
Calcium: 9.5 mg/dL (ref 8.4–10.5)
Chloride: 102 mEq/L (ref 96–112)
Creatinine, Ser: 2.03 mg/dL — ABNORMAL HIGH (ref 0.40–1.50)
GFR: 33.04 mL/min — ABNORMAL LOW (ref >60.00)
Glucose, Bld: 130 mg/dL — ABNORMAL HIGH (ref 70–99)
Potassium: 4.1 mEq/L (ref 3.5–5.1)
Sodium: 137 mEq/L (ref 135–145)
Total Bilirubin: 0.4 mg/dL (ref 0.2–1.2)
Total Protein: 7.6 g/dL (ref 6.0–8.3)

## 2022-08-18 LAB — CBC
HCT: 45.8 % (ref 39.0–52.0)
Hemoglobin: 15.3 g/dL (ref 13.0–17.0)
MCHC: 33.3 g/dL (ref 30.0–36.0)
MCV: 86.7 fl (ref 78.0–100.0)
Platelets: 170 10*3/uL (ref 150.0–400.0)
RBC: 5.29 Mil/uL (ref 4.22–5.81)
RDW: 13.7 % (ref 11.5–15.5)
WBC: 3.9 10*3/uL — ABNORMAL LOW (ref 4.0–10.5)

## 2022-08-18 LAB — LIPID PANEL
Cholesterol: 143 mg/dL (ref 0–200)
HDL: 40.8 mg/dL (ref >39.00)
LDL Cholesterol: 81 mg/dL (ref 0–99)
NonHDL: 101.97
Total CHOL/HDL Ratio: 3
Triglycerides: 106 mg/dL (ref 0.0–149.0)
VLDL: 21.2 mg/dL (ref 0.0–40.0)

## 2022-08-18 LAB — HEMOGLOBIN A1C: Hgb A1c MFr Bld: 7.2 % — ABNORMAL HIGH (ref 4.6–6.5)

## 2022-08-18 NOTE — Patient Instructions (Addendum)
Bring your Planing Healthy Meals handout to your next visit  Walk 3-4 times per week 30-45 min each time.  Include vegetable with lunch and dinner. Use handout for ideas.  Only use a drizzle of salad dressing and think about dipping your fork in the dressing.

## 2022-08-18 NOTE — Progress Notes (Unsigned)
Diabetes Self-Management Education  Visit Type:   follow-up  Appointment Start time:  1023 Appointment End time:  1055  Primary concerns today: lunch & dinner options Referral diagnosis: E11.65,Z79.4 (ICD-10-CM) - Uncontrolled type 2 diabetes mellitus with hyperglycemia, with long-term current use of insulin (HCC) (pt is using Humalog 75/25) Preferred learning style: no preference indicated Learning readiness: contemplating  NUTRITION ASSESSMENT  Anthropometrics  Wt Readings from Last 3 Encounters:  08/17/22 288 lb 9.6 oz (130.9 kg)  05/19/22 297 lb 12.8 oz (135.1 kg)  04/13/22 294 lb 6.4 oz (133.5 kg)   Clinical Medical Hx:  Uncontrolled type 2 diabetes mellitus with hyperglycemia, with long-term current use of insulin; HTN, CKD stage 3, hypersholesterolemia Medications: reviewed DM related: Ozempic and Humalog 75/25  Lab Results  Component Value Date   HGBA1C 8.1 (H) 05/14/2022  (new labs drawn at PCP visit today, Dexcom GMI 6.7%)  Notable Signs/Symptoms: none reported  Lifestyle & Dietary Hx Pt states having CGM has helped him make dietary changes that has lowered is blood sugar.  Physical activity: Walking 2x/ week for 45-90 min.  Diet: Pt is not getting daily vegetable intake. Pt states he likes salads but also likes dressing and thinks he may be using too much salad dressing.  Pt had questions about supplements and weight loss programs, including the [place] weight management clinic. Discussed how [facility] weight loss program is probably based on eating healthy and getting regular exercise and what helps some people is the regular check-in to keep them accountable and stay on track with healthy behaviors. But I also stated that I don't work with them directly so I don't know the details of their program. I know that it works for some but no one program will work for everyone.   24-Hr Dietary Recall  First Meal: Greek yogurt (Great Value), apple, orange, granola  with nuts, coffee with splenda and creamer (has not changed since last visit) Snack: none Second Meal: 1:30 steak, shrimp, fried rice Snack: banana Third Meal: 7:30 sausage, peaches Snack: none Beverages: coffee, 50-70 oz water daily    NUTRITION INTERVENTION  Nutrition education (E-1) on the following topics:  MyPlate - more detail Exercise Calories and sugar/salt/fat qualities of salad dressing Interpreting CGM GMI   Learning Objective:  Patient will have a greater understanding of diabetes self-management. Patient education plan is to attend individual and/or group sessions per assessed needs and concerns.  Patient Instructions  Bring your Planing Healthy Meals handout to your next visit  Walk 3-4 times per week 30-45 min each time.  Include vegetable with lunch and dinner. Use handout for ideas.  Only use a drizzle of salad dressing and think about dipping your fork in the dressing.    Expected Outcomes:   Demonstrated interest in learning. Expect positive outcomes  Education material provided: Planning Healthy Meals  If problems or questions, patient to contact team via:  Phone  Future DSME appointment: -  4 months

## 2022-08-18 NOTE — Addendum Note (Signed)
Addended by: Isaias Dowson F on: 08/18/2022 10:00 AM   Modules accepted: Orders  

## 2022-08-18 NOTE — Addendum Note (Signed)
Addended by: [place] on: 08/18/2022 12:06 PM   Modules accepted: Orders

## 2022-08-18 NOTE — Addendum Note (Signed)
Addended by: Cleda Mccreedy F on: 08/18/2022 10:00 AM   Modules accepted: Orders

## 2022-08-19 ENCOUNTER — Encounter: Payer: Self-pay | Admitting: Registered"

## 2022-08-19 ENCOUNTER — Encounter: Payer: Self-pay | Admitting: "Endocrinology

## 2022-08-19 ENCOUNTER — Ambulatory Visit (INDEPENDENT_AMBULATORY_CARE_PROVIDER_SITE_OTHER): Payer: Medicare Other | Admitting: "Endocrinology

## 2022-08-19 VITALS — BP 130/80 | HR 77 | Ht 71.0 in | Wt 289.2 lb

## 2022-08-19 DIAGNOSIS — N1832 Chronic kidney disease, stage 3b: Secondary | ICD-10-CM

## 2022-08-19 DIAGNOSIS — E1122 Type 2 diabetes mellitus with diabetic chronic kidney disease: Secondary | ICD-10-CM | POA: Diagnosis not present

## 2022-08-19 DIAGNOSIS — Z7984 Long term (current) use of oral hypoglycemic drugs: Secondary | ICD-10-CM

## 2022-08-19 DIAGNOSIS — E7800 Pure hypercholesterolemia, unspecified: Secondary | ICD-10-CM

## 2022-08-19 DIAGNOSIS — Z7985 Long-term (current) use of injectable non-insulin antidiabetic drugs: Secondary | ICD-10-CM | POA: Diagnosis not present

## 2022-08-19 DIAGNOSIS — Z794 Long term (current) use of insulin: Secondary | ICD-10-CM | POA: Diagnosis not present

## 2022-08-19 LAB — MICROALBUMIN / CREATININE URINE RATIO
Creatinine,U: 87.7 mg/dL
Microalb Creat Ratio: 3.1 mg/g (ref 0.0–30.0)
Microalb, Ur: 2.8 mg/dL — ABNORMAL HIGH (ref 0.0–1.9)

## 2022-08-19 LAB — TESTOSTERONE,FREE AND TOTAL

## 2022-08-19 LAB — TESTOSTERONE TOTAL,FREE,BIO, MALES
Albumin: 4.1 g/dL (ref 3.6–5.1)
Sex Hormone Binding: 31 nmol/L (ref 22–77)
Testosterone: 163 ng/dL — ABNORMAL LOW (ref 250–827)

## 2022-08-19 LAB — TESTOSTERONE, FREE AND TOTAL (INCLUDES SHBG)-(MALES)

## 2022-08-19 LAB — PROLACTIN: Prolactin: 5 ng/mL (ref 2.0–18.0)

## 2022-08-19 MED ORDER — ROSUVASTATIN CALCIUM 20 MG PO TABS: 20.0000 mg | ORAL_TABLET | Freq: Every day | ORAL | 1 refills | Status: DC

## 2022-08-19 MED ORDER — SEMAGLUTIDE (2 MG/DOSE) 8 MG/3ML ~~LOC~~ SOPN: 2.0000 mg | PEN_INJECTOR | SUBCUTANEOUS | 2 refills | Status: DC

## 2022-08-19 MED ORDER — GVOKE HYPOPEN 1-PACK 1 MG/0.2ML ~~LOC~~ SOAJ: 1.0000 mg | SUBCUTANEOUS | 2 refills | Status: DC | PRN

## 2022-08-19 NOTE — Progress Notes (Signed)
Outpatient Endocrinology Note David Marblehead, MD  08/19/22   David Lindsey Aug 20, 1955 [id number]  Referring Provider: Olive Bass,* Primary Care Provider: Olive Bass, FNP Reason for consultation: Subjective   Assessment & Plan  Diagnoses and all orders for this visit:  Type 2 diabetes mellitus with stage 3b chronic kidney disease, with long-term current use of insulin (HCC)  Long term (current) use of oral hypoglycemic drugs  Long-term (current) use of injectable non-insulin antidiabetic drugs  Pure hypercholesterolemia  Other orders -     Glucagon (GVOKE HYPOPEN 1-PACK) 1 MG/0.2ML SOAJ; Inject 1 mg into the skin as needed (low blood suagr with impaired consciousness). -     rosuvastatin (CRESTOR) 20 MG tablet; Take 1 tablet (20 mg total) by mouth daily. -     Semaglutide, 2 MG/DOSE, 8 MG/3ML SOPN; Inject 2 mg as directed once a week.    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 Will recommend for the following change of medications to: Farxiga 10 mg daily, ozempic 2 mg weekly  (pt is on pt assistance for ozempic, will apply for dose increment) Humalog 75/25 mix 14 units in a.m. 14 units before dinner, 15 min before eating   No known contraindications to any of above medications Glucagon ordered, 08/19/22  Hyperlipidemia -Last LDL near goal: 81 -on rosuvastatin 5 mg QD -Increase dose to rosuvastatin 20 mg QD -Follow low fat diet and exercise    -Blood pressure goal <140/90 - Microalbumin/creatinine goal < 30 -On lisinopril 10 mg, sees nephrologist  -diet changes including salt restriction -limit eating outside -counseled BP targets per standards of diabetes care -Uncontrolled blood pressure can lead to retinopathy, nephropathy and cardiovascular and atherosclerotic heart disease  Reviewed and counseled on: -A1C target -Blood sugar targets -Complications of uncontrolled diabetes   -Checking blood sugar before meals and bedtime and bring log next visit -All medications with mechanism of action and side effects -Hypoglycemia management: rule of 15's, Glucagon Emergency Kit and medical alert ID -low-carb low-fat plate-method diet -At least 20 minutes of physical activity per day -Annual dilated retinal eye exam and foot exam -compliance and follow up needs -follow up as scheduled or earlier if problem gets worse  Call if blood sugar is less than 70 or consistently above 250    Take a 15 gm snack of carbohydrate at bedtime before you go to sleep if your blood sugar is less than 100.    If you are going to fast after midnight for a test or procedure, ask your physician for instructions on how to reduce/decrease your insulin dose.    Call if blood sugar is less than 70 or consistently above 250  -Treating a low sugar by rule of 15  (15 gms of sugar every 15 min until sugar is more than 70) If you feel your sugar is low, test your sugar to be sure If your sugar is low (less than 70), then take 15 grams of a fast acting Carbohydrate (3-4 glucose tablets or glucose gel or 4 ounces of juice or regular soda) Recheck your sugar 15 min after treating low to make sure it is more than 70 If sugar is still less than 70, treat again with 15 grams of carbohydrate          Don't drive the hour of hypoglycemia  If unconscious/unable to eat or drink by mouth, use glucagon injection or nasal spray baqsimi and call 911. Can  repeat again in 15 min if still unconscious.  Return in about 3 months (around 11/19/2022) for DM, no labs.   I have reviewed current medications, nurse's notes, allergies, vital signs, past medical and surgical history, family medical history, and social history for this encounter. Counseled patient on symptoms, examination findings, lab findings, imaging results, treatment decisions and monitoring and prognosis. The patient understood the recommendations and agrees  with the treatment plan. All questions regarding treatment plan were fully answered.  David Thurston, MD  08/19/22    History of Present Illness David Lindsey is a 67 y.o. year old male who presents for evaluation of Type 2 diabetes mellitus.  David Lindsey was first diagnosed in 2013.   Diabetes education +  Home diabetes regimen: Non-insulin hypoglycemic drugs: Farxiga 10 mg daily, ozempic 1 mg weekly   Insulin regimen: Humalog 75/25 mix 16 units in a.m. 16 units before dinner, right before eating   No history of MEN syndrome/medullary thyroid cancer/pancreatitis or pancreatic cancer in self or family No UTI  Previous history:  Non-insulin hypoglycemic drugs previously used: Metformin, Invokana, Amaryl, Prandin, victoza Metformin stopped in 2019 Farxiga started in 2021 replacing Invokana Insulin was started in 5/20, generally has been on premixed insulin  COMPLICATIONS -  MI/Stroke -  retinopathy, last eye exam 2024 +  neuropathy +  nephropathy, GFR 33  BLOOD SUGAR DATA  CGM interpretation: At today's visit, we reviewed her CGM downloads. The full report is scanned in the media. Reviewing the CGM trends, BG well controlled except rare random lows.  Physical Exam  BP 130/80 (BP Location: Left Arm, Patient Position: Sitting, Cuff Size: Normal)   Pulse 77   Ht 5\' 11"  (1.803 m)   Wt 289 lb 3.2 oz (131.2 kg)   SpO2 98%   BMI 40.34 kg/m    Constitutional: well developed, well nourished Head: normocephalic, atraumatic Eyes: sclera anicteric, no redness Neck: supple Lungs: normal respiratory effort Neurology: alert and oriented Skin: dry, no appreciable rashes Musculoskeletal: no appreciable defects Psychiatric: normal mood and affect Diabetic Foot Exam - Simple   Simple Foot Form Diabetic Foot exam was performed with the following findings: Yes 08/19/2022 10:31 AM  Visual Inspection No deformities, no ulcerations, no other skin breakdown bilaterally:  Yes Sensation Testing Intact to touch and monofilament testing bilaterally: Yes Pulse Check Posterior Tibialis and Dorsalis pulse intact bilaterally: Yes Comments      Current Medications Patient's Medications  New Prescriptions   GLUCAGON (GVOKE HYPOPEN 1-PACK) 1 MG/0.2ML SOAJ    Inject 1 mg into the skin as needed (low blood suagr with impaired consciousness).   SEMAGLUTIDE, 2 MG/DOSE, 8 MG/3ML SOPN    Inject 2 mg as directed once a week.  Previous Medications   AMLODIPINE (NORVASC) 10 MG TABLET    Take 1 tablet (10 mg total) by mouth daily.   APPLE CIDER VINEGAR 600 MG CAPS    Take 600 mg by mouth daily.   BIOTIN W/ VITAMINS C & E (HAIR SKIN & NAILS GUMMIES PO)    Take 1 tablet by mouth every evening.   CARBOXYMETHYLCELLUL-GLYCERIN (LUBRICATING EYE DROPS OP)    Place 1 drop into both eyes daily as needed (dry eyes).    CHOLECALCIFEROL (VITAMIN D3) 25 MCG (1000 UNIT) TABLET    Take 1,000 Units by mouth daily.   CLOTRIMAZOLE-BETAMETHASONE (LOTRISONE) CREAM    APPLY  CREAM TOPICALLY TO AFFECTED AREA THREE TIMES DAILY AS NEEDED FOR  RASH   COENZYME Q10 (COQ-10) 200 MG  CAPS    Take 200 mg by mouth every evening.    CONTINUOUS BLOOD GLUC SENSOR (FREESTYLE LIBRE 2 SENSOR) MISC    1 Device by Does not apply route every 14 (fourteen) days.   CONTINUOUS BLOOD GLUC SENSOR (FREESTYLE LIBRE 3 SENSOR) MISC    by Does not apply route.   CYANOCOBALAMIN (B-12) 5000 MCG CAPS    Take 5,000 mcg by mouth every evening.    DAPAGLIFLOZIN PROPANEDIOL (FARXIGA) 10 MG TABS TABLET    Take 1 tablet (10 mg total) by mouth daily.   DICLOFENAC SODIUM (VOLTAREN) 1 % GEL    Apply topically 4 (four) times daily.   DOCUSATE SODIUM (COLACE) 100 MG CAPSULE    Take 200 mg by mouth every evening.    GABAPENTIN (NEURONTIN) 300 MG CAPSULE    Take 1 capsule (300 mg total) by mouth 3 (three) times daily.   GLUCOSE BLOOD (ONETOUCH ULTRA) TEST STRIP    Use as instructed Check blood sugars on waking up 3 days a week   GLUCOSE  BLOOD TEST STRIP    Use as instructed twice a day at different times   INSULIN LISPRO PROTAMINE-LISPRO (HUMALOG 75/25 MIX) (75-25) 100 UNIT/ML SUSP INJECTION    Inject 16 Units into the skin daily with breakfast.   LISINOPRIL (ZESTRIL) 10 MG TABLET    Take 1 tablet (10 mg total) by mouth daily.   MENTHOL, TOPICAL ANALGESIC, 8 % LIQD    Apply 1 application topically 3 (three) times daily as needed (for knee pain.).    METHYLCELLULOSE, LAXATIVE, 500 MG TABS    Take 500 mg by mouth daily. Fiberwell Gummies   METOPROLOL SUCCINATE (TOPROL-XL) 25 MG 24 HR TABLET    Take 1/2 tablet by mouth at bedtime   MM PEN NEEDLES 31G X 8 MM MISC    AS DIRECTED WITH  VICTOZA   MULTIPLE VITAMIN (MULTIVITAMIN WITH MINERALS) TABS TABLET    Take 1 tablet by mouth every evening.    OMEGA-3-6-9 CAPS    Take 1 capsule by mouth every evening. 1600mg    OMEPRAZOLE (PRILOSEC) 20 MG CAPSULE    Take 1 capsule (20 mg total) by mouth daily.   SILDENAFIL (VIAGRA) 100 MG TABLET    Take 1 tablet (100 mg total) by mouth daily as needed.   SODIUM BICARBONATE PO    Take 1 tablet by mouth 2 (two) times daily.   TRAZODONE (DESYREL) 100 MG TABLET    Take 1 tablet (100 mg total) by mouth at bedtime.   TROLAMINE SALICYLATE (ASPERCREME) 10 % CREAM    Apply 1 application topically as needed (knee pain).   VITAMIN E 400 UNIT CAPSULE    Take 400 Units by mouth every evening.   Modified Medications   Modified Medication Previous Medication   ROSUVASTATIN (CRESTOR) 20 MG TABLET rosuvastatin (CRESTOR) 5 MG tablet      Take 1 tablet (20 mg total) by mouth daily.    Take 1 tablet (5 mg total) by mouth daily.  Discontinued Medications   SEMAGLUTIDE, 1 MG/DOSE, 2 MG/1.5ML SOPN    Inject into the skin.    Allergies Allergies  Allergen Reactions   Pioglitazone Swelling    Past Medical History Past Medical History:  Diagnosis Date   ALCOHOLISM 12/22/2007   ANEMIA, IRON DEFICIENCY 12/26/2007   Arthritis    OA   Blood transfusion without  reported diagnosis    as a child   DIABETES MELLITUS, TYPE II 11/10/2006   ESOPHAGITIS,  REFLUX 12/22/2007   GERD (gastroesophageal reflux disease)    HYPERCHOLESTEROLEMIA 12/22/2007   HYPERTENSION 11/10/2006   HYPOGONADISM 12/22/2007   Prob. due to ETOH   HYPOKALEMIA 06/06/2008   Leukopenia    Neuromuscular disorder (HCC)    neuropathy in feet from Diabetes   Neuropathy    Chronic Neuropathy BOTH FEET   Pancytopenia 06/06/2008   RENAL INSUFFICIENCY 11/10/2006   SEES [place] [facility] STAGE 2 CKD    Past Surgical History Past Surgical History:  Procedure Laterality Date   APPENDECTOMY  AGE 20 OR 10   COLONOSCOPY  08-21-2004/2017   [facility]- normal/2017 [place]   COLONSCOPY  2017   ELECTROCARDIOGRAM  11/07/2006   ESOPHAGOGASTRODUODENOSCOPY  08/20/2004   EYE SURGERY Bilateral 2014   IOC FOR CATARACTS   I & D KNEE WITH POLY EXCHANGE Left 05/16/2019   Procedure: LEFT KNEE IRRIGATION AND DEBRIDEMENT WITH POLY EXCHANGE;  Surgeon: Gean Birchwood, MD;  Location: [facility];  Service: Orthopedics;  Laterality: Left;   REPLACEMENT TOTAL KNEE Right 2011   TOTAL KNEE ARTHROPLASTY Left 02/20/2018   Procedure: LEFT TOTAL KNEE ARTHROPLASTY;  Surgeon: Gean Birchwood, MD;  Location: [facility];  Service: Orthopedics;  Laterality: Left;   UPPER GASTROINTESTINAL ENDOSCOPY      Family History family history includes Cancer in his mother; Colon cancer (age of onset: 108) in his mother; Colon polyps in his brother.  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  Tobacco Use   Smoking status: Never   Smokeless tobacco: Never  Vaping Use   Vaping Use: Never used  Substance and Sexual Activity   Alcohol use: No    Alcohol/week: 0.0 standard drinks of alcohol    Comment:  LAST USE 10 YRS AGO   Drug use: No   Sexual activity: Not on file  Other Topics Concern   Not on file  Social History Narrative   Not on file   Social Determinants of Health   Financial Resource Strain: Low Risk  (08/14/2021)   Overall Financial Resource Strain (CARDIA)    Difficulty of Paying Living Expenses: Not hard at all  Food Insecurity: No Food Insecurity (08/17/2022)   Hunger Vital Sign    Worried About Running Out of Food in the Last Year: Never true    Ran Out of Food in the Last Year: Never true  Transportation Needs: No Transportation Needs (08/17/2022)   PRAPARE - Administrator, Civil Service (Medical): No    Lack of Transportation (Non-Medical): No  Physical Activity: Not on file  Stress: No Stress Concern Present (08/14/2021)   [employer] of Occupational Health - Occupational Stress Questionnaire    Feeling of Stress : Not at all  Social Connections: Moderately Integrated (08/14/2021)   Social Connection and Isolation Panel [NHANES]    Frequency of Communication with Friends and Family: More than three times a week    Frequency of Social Gatherings with Friends and Family: More than three times a week    Attends Religious Services: More than 4 times per year    Active Member of [employer] or Organizations: No    Attends Banker Meetings: Never    Marital Status: Married  Intimate Partner Violence: Not At Risk (08/17/2022)   Humiliation, Afraid, Rape, and Kick questionnaire    Fear of Current or Ex-Partner: No    Emotionally Abused: No    Physically Abused: No    Sexually Abused: No    Lab Results  Component Value Date   HGBA1C 7.2 (H) 08/18/2022   Lab Results  Component Value Date   CHOL 143 08/18/2022   Lab Results  Component Value Date   HDL 40.80 08/18/2022   Lab Results  Component Value Date   LDLCALC 81 08/18/2022   Lab Results  Component Value Date   TRIG 106.0 08/18/2022   Lab Results  Component Value Date   CHOLHDL 3  08/18/2022   Lab Results  Component Value Date   CREATININE 2.03 (H) 08/18/2022   Lab Results  Component Value Date   GFR 33.04 (L) 08/18/2022   Lab Results  Component Value Date   MICROALBUR 2.8 (H) 08/18/2022      Component Value Date/Time   NA 137 08/18/2022 1001   K 4.1 08/18/2022 1001   CL 102 08/18/2022 1001   CO2 25 08/18/2022 1001   GLUCOSE 130 (H) 08/18/2022 1001   BUN 19 08/18/2022 1001   CREATININE 2.03 (H) 08/18/2022 1001   CREATININE 2.21 (H) 10/18/2019 1600   CALCIUM 9.5 08/18/2022 1001   PROT 7.6 08/18/2022 1001   ALBUMIN 4.2 08/18/2022 1001   AST 21 08/18/2022 1001   ALT 26 08/18/2022 1001   ALKPHOS 56 08/18/2022 1001   BILITOT 0.4 08/18/2022 1001   GFRNONAA 38 (L) 05/17/2019 0421   GFRAA 44 (L) 05/17/2019 0421      Latest Ref Rng & Units 08/18/2022   10:01 AM 05/14/2022    8:47 AM 04/13/2022   10:52 AM  BMP  Glucose 70 - 99 mg/dL 409  811  914   BUN 6 - 23 mg/dL 19   19   Creatinine 7.82 - 1.50 mg/dL 9.56   2.13   Sodium 086 - 145 mEq/L 137   136   Potassium 3.5 - 5.1 mEq/L 4.1   4.3   Chloride 96 - 112 mEq/L 102   102   CO2 19 - 32 mEq/L 25   22   Calcium 8.4 - 10.5 mg/dL 9.5   9.4        Component Value Date/Time   WBC 3.9 (L) 08/18/2022 1001   RBC 5.29 08/18/2022 1001   HGB 15.3 08/18/2022 1001   HCT 45.8 08/18/2022 1001   PLT 170.0 08/18/2022 1001   MCV 86.7 08/18/2022 1001   MCH 28.0 10/18/2019 1600   MCHC 33.3 08/18/2022 1001   RDW 13.7 08/18/2022 1001   LYMPHSABS 1.4 04/13/2022 1052   MONOABS 0.6 04/13/2022 1052   EOSABS 0.4 04/13/2022 1052   BASOSABS 0.0 04/13/2022 1052     Parts of this note may have been dictated using voice recognition software. There may be variances in spelling and vocabulary which are unintentional. Not all errors are proofread. Please notify the Thereasa Parkin if any discrepancies are noted or if the meaning of any statement is not clear.

## 2022-08-19 NOTE — Progress Notes (Signed)
Application has been mailed to the patient.

## 2022-08-19 NOTE — Patient Instructions (Signed)
Will recommend for the following change of medications to: Farxiga 10 mg daily, ozempic 2 mg weekly   Humalog 75/25 mix 14 units in a.m. 14 units before dinner, 15 min before eating    Goals of DM therapy:  Morning Fasting blood sugar: 80-140  Blood sugar before meals: 80-140 Bed time blood sugar: 100-150  A1C <7%, limited only by hypoglycemia  1.Diabetes medications and their side effects discussed, including hypoglycemia    2. Check blood glucose:  a) Always check blood sugars before driving. Please see below (under hypoglycemia) on how to manage b) Check a minimum of 3 times/day or more as needed when having symptoms of hypoglycemia.   c) Try to check blood glucose before sleeping/in the middle of the night to ensure that it is remaining stable and not dropping less than 100 d) Check blood glucose more often if sick  3. Diet: a) 3 meals per day schedule b: Restrict carbs to 60-70 grams (4 servings) per meal c) Colorful vegetables - 3 servings a day, and low sugar fruit 2 servings/day Plate control method: 1/4 plate protein, 1/4 starch, 1/2 green, yellow, or red vegetables d) Avoid carbohydrate snacks unless hypoglycemic episode, or increased physical activity  4. Regular exercise as tolerated, preferably 3 or more hours a week  5. Hypoglycemia: a)  Do not drive or operate machinery without first testing blood glucose to assure it is over 90 mg%, or if dizzy, lightheaded, not feeling normal, etc, or  if foot or leg is numb or weak. b)  If blood glucose less than 70, take four 5gm Glucose tabs or 15-30 gm Glucose gel.  Repeat every 15 min as needed until blood sugar is >100 mg/dl. If hypoglycemia persists then call 911.   6. Sick day management: a) Check blood glucose more often b) Continue usual therapy if blood sugars are elevated.   7. Contact the doctor immediately if blood glucose is frequently <60 mg/dl, or an episode of severe hypoglycemia occurs (where someone had to  give you glucose/  glucagon or if you passed out from a low blood glucose), or if blood glucose is persistently >350 mg/dl, for further management  8. A change in level of physical activity or exercise and a change in diet may also affect your blood sugar. Check blood sugars more often and call if needed.  Instructions: 1. Bring glucose meter, blood glucose records on every visit for review 2. Continue to follow up with primary care physician and other providers for medical care 3. Yearly eye  and foot exam 4. Please get blood work done prior to the next appointment

## 2022-08-23 ENCOUNTER — Encounter: Payer: Self-pay | Admitting: "Endocrinology

## 2022-08-25 ENCOUNTER — Other Ambulatory Visit: Payer: Self-pay | Admitting: "Endocrinology

## 2022-08-26 LAB — TESTOSTERONE, FREE AND TOTAL (INCLUDES SHBG)-(MALES)
% Free Testosterone: 1.7 %
Free Testosterone, S: 28 pg/mL — ABNORMAL LOW
Testosterone, Serum (Total): 165 ng/dL — ABNORMAL LOW

## 2022-08-26 LAB — TESTOSTERONE,FREE AND TOTAL: Testosterone: 175 ng/dL — ABNORMAL LOW (ref 264–916)

## 2022-08-29 ENCOUNTER — Other Ambulatory Visit: Payer: Self-pay | Admitting: Endocrinology

## 2022-08-31 ENCOUNTER — Other Ambulatory Visit: Payer: Self-pay | Admitting: Endocrinology

## 2022-09-01 ENCOUNTER — Other Ambulatory Visit: Payer: Self-pay | Admitting: Endocrinology

## 2022-09-09 DIAGNOSIS — E118 Type 2 diabetes mellitus with unspecified complications: Secondary | ICD-10-CM | POA: Diagnosis not present

## 2022-09-09 DIAGNOSIS — Z794 Long term (current) use of insulin: Secondary | ICD-10-CM | POA: Diagnosis not present

## 2022-09-30 ENCOUNTER — Telehealth: Payer: Self-pay

## 2022-09-30 NOTE — Telephone Encounter (Signed)
I left David Lindsey a voicemail to come pick up his Ozempic anytime between 8:00 am to 4:30 pm

## 2022-10-01 NOTE — Telephone Encounter (Signed)
Patient picked up patient assistance Ozempic- Log noted

## 2022-10-07 ENCOUNTER — Other Ambulatory Visit: Payer: Self-pay | Admitting: Endocrinology

## 2022-10-09 DIAGNOSIS — Z794 Long term (current) use of insulin: Secondary | ICD-10-CM | POA: Diagnosis not present

## 2022-10-09 DIAGNOSIS — E118 Type 2 diabetes mellitus with unspecified complications: Secondary | ICD-10-CM | POA: Diagnosis not present

## 2022-11-02 DIAGNOSIS — Z794 Long term (current) use of insulin: Secondary | ICD-10-CM | POA: Diagnosis not present

## 2022-11-02 DIAGNOSIS — E118 Type 2 diabetes mellitus with unspecified complications: Secondary | ICD-10-CM | POA: Diagnosis not present

## 2022-11-17 ENCOUNTER — Other Ambulatory Visit: Payer: Self-pay | Admitting: Endocrinology

## 2022-11-17 ENCOUNTER — Telehealth: Payer: Self-pay

## 2022-11-17 NOTE — Telephone Encounter (Signed)
Dr Roosevelt Locks Mr Pippins is calling about prior labs he needs to get prior to appointment on 11/24/22 I dont see any orders in , but your last note states that he is to have labs prior to visit, please advise?

## 2022-11-18 ENCOUNTER — Telehealth: Payer: Self-pay | Admitting: Family

## 2022-11-18 NOTE — Telephone Encounter (Signed)
Pt said that Dr. Lucianne Muss had prescribed Clotrimazole-betamethasone but advised that he needs to get that refill from his PCP. Pt is requesting a refill to be sent to [facility] on [address].

## 2022-11-19 ENCOUNTER — Other Ambulatory Visit: Payer: Self-pay | Admitting: Family

## 2022-11-19 MED ORDER — CLOTRIMAZOLE-BETAMETHASONE 1-0.05 % EX CREA: TOPICAL_CREAM | CUTANEOUS | 0 refills | Status: DC

## 2022-11-24 ENCOUNTER — Ambulatory Visit (INDEPENDENT_AMBULATORY_CARE_PROVIDER_SITE_OTHER): Payer: Medicare Other | Admitting: "Endocrinology

## 2022-11-24 ENCOUNTER — Encounter: Payer: Self-pay | Admitting: "Endocrinology

## 2022-11-24 VITALS — BP 124/70 | HR 80 | Ht 71.0 in | Wt 283.4 lb

## 2022-11-24 DIAGNOSIS — Z794 Long term (current) use of insulin: Secondary | ICD-10-CM | POA: Diagnosis not present

## 2022-11-24 DIAGNOSIS — E7800 Pure hypercholesterolemia, unspecified: Secondary | ICD-10-CM

## 2022-11-24 DIAGNOSIS — Z7984 Long term (current) use of oral hypoglycemic drugs: Secondary | ICD-10-CM | POA: Diagnosis not present

## 2022-11-24 DIAGNOSIS — N1832 Chronic kidney disease, stage 3b: Secondary | ICD-10-CM | POA: Diagnosis not present

## 2022-11-24 DIAGNOSIS — E1122 Type 2 diabetes mellitus with diabetic chronic kidney disease: Secondary | ICD-10-CM | POA: Diagnosis not present

## 2022-11-24 DIAGNOSIS — Z7985 Long-term (current) use of injectable non-insulin antidiabetic drugs: Secondary | ICD-10-CM | POA: Diagnosis not present

## 2022-11-24 LAB — POCT GLYCOSYLATED HEMOGLOBIN (HGB A1C): Hemoglobin A1C: 6.8 % — AB (ref 4.0–5.6)

## 2022-11-24 NOTE — Patient Instructions (Signed)

## 2022-11-24 NOTE — Progress Notes (Signed)
Outpatient Endocrinology Note David Breese, MD  11/24/22   David Lindsey 11/01/1955 [id number]  Referring Provider: Olive Bass,* Primary Care Provider: Olive Bass, FNP Reason for consultation: Subjective   Assessment & Plan  Diagnoses and all orders for this visit:  Type 2 diabetes mellitus with stage 3b chronic kidney disease, with long-term current use of insulin (HCC) -     POCT glycosylated hemoglobin (Hb A1C) -     Comprehensive metabolic panel; Future -     Lipid panel; Future -     Hemoglobin A1c; Future  Long term (current) use of oral hypoglycemic drugs  Long-term (current) use of injectable non-insulin antidiabetic drugs  Long-term insulin use (HCC)  Pure hypercholesterolemia    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 Will recommend for the following change of medications to: Farxiga 10 mg daily, ozempic 1 mg weekly  (pt is on pt assistance for ozempic, waiting for 2 mg dose increment approval) Humalog 75/25 mix 16 units in a.m. 16 units before dinner, 15 min before eating (except 15 units bd the day pt takes ozempic to avoid lows as experienced by patient currently)  No known contraindications to any of above medications Glucagon ordered, 08/19/22  Hyperlipidemia -Last LDL near goal: 81 -on rosuvastatin 20 mg every day since 08/19/22 -Follow low fat diet and exercise   -Blood pressure goal <140/90 - Microalbumin/creatinine goal < 30 -On lisinopril 10 mg, sees nephrologist  -diet changes including salt restriction -limit eating outside -counseled BP targets per standards of diabetes care -Uncontrolled blood pressure can lead to retinopathy, nephropathy and cardiovascular and atherosclerotic heart disease  Reviewed and counseled on: -A1C target -Blood sugar targets -Complications of uncontrolled diabetes  -Checking blood sugar before meals and bedtime and bring log next  visit -All medications with mechanism of action and side effects -Hypoglycemia management: rule of 15's, Glucagon Emergency Kit and medical alert ID -low-carb low-fat plate-method diet -At least 20 minutes of physical activity per day -Annual dilated retinal eye exam and foot exam -compliance and follow up needs -follow up as scheduled or earlier if problem gets worse  Call if blood sugar is less than 70 or consistently above 250    Take a 15 gm snack of carbohydrate at bedtime before you go to sleep if your blood sugar is less than 100.    If you are going to fast after midnight for a test or procedure, ask your physician for instructions on how to reduce/decrease your insulin dose.    Call if blood sugar is less than 70 or consistently above 250  -Treating a low sugar by rule of 15  (15 gms of sugar every 15 min until sugar is more than 70) If you feel your sugar is low, test your sugar to be sure If your sugar is low (less than 70), then take 15 grams of a fast acting Carbohydrate (3-4 glucose tablets or glucose gel or 4 ounces of juice or regular soda) Recheck your sugar 15 min after treating low to make sure it is more than 70 If sugar is still less than 70, treat again with 15 grams of carbohydrate          Don't drive the hour of hypoglycemia  If unconscious/unable to eat or drink by mouth, use glucagon injection or nasal spray baqsimi and call 911. Can repeat again in 15 min if still unconscious.  Return in about 4 months (  around 03/26/2023).   I have reviewed current medications, nurse's notes, allergies, vital signs, past medical and surgical history, family medical history, and social history for this encounter. Counseled patient on symptoms, examination findings, lab findings, imaging results, treatment decisions and monitoring and prognosis. The patient understood the recommendations and agrees with the treatment plan. All questions regarding treatment plan were fully  answered.  David [place], MD  11/24/22    History of Present Illness David Lindsey is a 67 y.o. year old male who presents for evaluation of Type 2 diabetes mellitus.  Fabian Ros was first diagnosed in 2013.   Diabetes education +  Home diabetes regimen: Non-insulin hypoglycemic drugs: Farxiga 10 mg daily, ozempic 1 mg weekly   Insulin regimen: Humalog 75/25 mix 16 units in a.m. 16 units before dinner, right before eating   No history of MEN syndrome/medullary thyroid cancer/pancreatitis or pancreatic cancer in self or family No UTI  Previous history:  Non-insulin hypoglycemic drugs previously used: Metformin, Invokana, Amaryl, Prandin, victoza Metformin stopped in 2019 Farxiga started in 2021 replacing Invokana Insulin was started in 5/20, generally has been on premixed insulin  COMPLICATIONS -  MI/Stroke -  retinopathy, last eye exam 2024 +  neuropathy +  nephropathy, GFR 33  BLOOD SUGAR DATA  CGM interpretation: At today's visit, we reviewed her CGM downloads. The full report is scanned in the media. Reviewing the CGM trends, BG well controlled except rare lows overnight and some high in afternoon-evening.  Physical Exam  BP 124/70   Pulse 80   Ht 5\' 11"  (1.803 m)   Wt 283 lb 6.4 oz (128.5 kg)   SpO2 97%   BMI 39.53 kg/m    Constitutional: well developed, well nourished Head: normocephalic, atraumatic Eyes: sclera anicteric, no redness Neck: supple Lungs: normal respiratory effort Neurology: alert and oriented Skin: dry, no appreciable rashes Musculoskeletal: no appreciable defects Psychiatric: normal mood and affect Diabetic Foot Exam - Simple   No data filed      Current Medications Patient's Medications  New Prescriptions   No medications on file  Previous Medications   AMLODIPINE (NORVASC) 10 MG TABLET    Take 1 tablet (10 mg total) by mouth daily.   APPLE CIDER VINEGAR 600 MG CAPS    Take 600 mg by mouth daily.   BIOTIN W/ VITAMINS C & E  (HAIR SKIN & NAILS GUMMIES PO)    Take 1 tablet by mouth every evening.   CARBOXYMETHYLCELLUL-GLYCERIN (LUBRICATING EYE DROPS OP)    Place 1 drop into both eyes daily as needed (dry eyes).    CHOLECALCIFEROL (VITAMIN D3) 25 MCG (1000 UNIT) TABLET    Take 1,000 Units by mouth daily.   CLOTRIMAZOLE-BETAMETHASONE (LOTRISONE) CREAM    APPLY  CREAM TOPICALLY THREE TIMES DAILY AS NEEDED FOR  RASH   COENZYME Q10 (COQ-10) 200 MG CAPS    Take 200 mg by mouth every evening.    CONTINUOUS BLOOD GLUC SENSOR (FREESTYLE LIBRE 2 SENSOR) MISC    1 Device by Does not apply route every 14 (fourteen) days.   CONTINUOUS BLOOD GLUC SENSOR (FREESTYLE LIBRE 3 SENSOR) MISC    by Does not apply route.   CYANOCOBALAMIN (B-12) 5000 MCG CAPS    Take 5,000 mcg by mouth every evening.    DAPAGLIFLOZIN PROPANEDIOL (FARXIGA) 10 MG TABS TABLET    Take 1 tablet (10 mg total) by mouth daily.   DICLOFENAC SODIUM (VOLTAREN) 1 % GEL    Apply topically 4 (four) times daily.  DOCUSATE SODIUM (COLACE) 100 MG CAPSULE    Take 200 mg by mouth every evening.    GABAPENTIN (NEURONTIN) 300 MG CAPSULE    Take 1 capsule (300 mg total) by mouth 3 (three) times daily.   GLUCAGON (GVOKE HYPOPEN 1-PACK) 1 MG/0.2ML SOAJ    INJECT 1 MG INTO THE SKIN AS NEEDED (LOW BLOOD SUAGR WITH IMPAIRED CONSCIOUSNESS).   GLUCOSE BLOOD (ONETOUCH ULTRA) TEST STRIP    Use as instructed Check blood sugars on waking up 3 days a week   GLUCOSE BLOOD TEST STRIP    Use as instructed twice a day at different times   INSULIN LISPRO PROTAMINE-LISPRO (HUMALOG 75/25 MIX) (75-25) 100 UNIT/ML SUSP INJECTION    Inject 16 Units into the skin daily with breakfast.   LISINOPRIL (ZESTRIL) 10 MG TABLET    Take 1 tablet (10 mg total) by mouth daily.   MENTHOL, TOPICAL ANALGESIC, 8 % LIQD    Apply 1 application topically 3 (three) times daily as needed (for knee pain.).    METHYLCELLULOSE, LAXATIVE, 500 MG TABS    Take 500 mg by mouth daily. Fiberwell Gummies   METOPROLOL SUCCINATE  (TOPROL-XL) 25 MG 24 HR TABLET    Take 1/2 tablet by mouth at bedtime   MM PEN NEEDLES 31G X 8 MM MISC    AS DIRECTED WITH  VICTOZA   MULTIPLE VITAMIN (MULTIVITAMIN WITH MINERALS) TABS TABLET    Take 1 tablet by mouth every evening.    OMEGA-3-6-9 CAPS    Take 1 capsule by mouth every evening. 1600mg    OMEPRAZOLE (PRILOSEC) 20 MG CAPSULE    Take 1 capsule (20 mg total) by mouth daily.   ROSUVASTATIN (CRESTOR) 20 MG TABLET    Take 1 tablet (20 mg total) by mouth daily.   SEMAGLUTIDE, 2 MG/DOSE, 8 MG/3ML SOPN    Inject 2 mg as directed once a week.   SILDENAFIL (VIAGRA) 100 MG TABLET    Take 1 tablet (100 mg total) by mouth daily as needed.   SODIUM BICARBONATE PO    Take 1 tablet by mouth 2 (two) times daily.   TRAZODONE (DESYREL) 100 MG TABLET    Take 1 tablet (100 mg total) by mouth at bedtime.   TROLAMINE SALICYLATE (ASPERCREME) 10 % CREAM    Apply 1 application topically as needed (knee pain).   VITAMIN E 400 UNIT CAPSULE    Take 400 Units by mouth every evening.   Modified Medications   No medications on file  Discontinued Medications   No medications on file    Allergies Allergies  Allergen Reactions   Pioglitazone Swelling    Past Medical History Past Medical History:  Diagnosis Date   ALCOHOLISM 12/22/2007   ANEMIA, IRON DEFICIENCY 12/26/2007   Arthritis    OA   Blood transfusion without reported diagnosis    as a child   DIABETES MELLITUS, TYPE II 11/10/2006   ESOPHAGITIS, REFLUX 12/22/2007   GERD (gastroesophageal reflux disease)    HYPERCHOLESTEROLEMIA 12/22/2007   HYPERTENSION 11/10/2006   HYPOGONADISM 12/22/2007   Prob. due to ETOH   HYPOKALEMIA 06/06/2008   Leukopenia    Neuromuscular disorder (HCC)    neuropathy in feet from Diabetes   Neuropathy    Chronic Neuropathy BOTH FEET   Pancytopenia 06/06/2008   RENAL INSUFFICIENCY 11/10/2006   SEES [place] KID[facility] STAGE 2 CKD    Past Surgical History Past Surgical History:  Procedure Laterality Date   APPENDECTOMY   AGE 78 OR 10  COLONOSCOPY  08-21-2004/2017   [facility]- normal/2017 [place]   COLONSCOPY  2017   ELECTROCARDIOGRAM  11/07/2006   ESOPHAGOGASTRODUODENOSCOPY  08/20/2004   EYE SURGERY Bilateral 2014   IOC FOR CATARACTS   I & D KNEE WITH POLY EXCHANGE Left 05/16/2019   Procedure: LEFT KNEE IRRIGATION AND DEBRIDEMENT WITH POLY EXCHANGE;  Surgeon: Gean Birchwood, MD;  Location: [facility];  Service: Orthopedics;  Laterality: Left;   REPLACEMENT TOTAL KNEE Right 2011   TOTAL KNEE ARTHROPLASTY Left 02/20/2018   Procedure: LEFT TOTAL KNEE ARTHROPLASTY;  Surgeon: Gean Birchwood, MD;  Location: [facility];  Service: Orthopedics;  Laterality: Left;   UPPER GASTROINTESTINAL ENDOSCOPY      Family History family history includes Cancer in his mother; Colon cancer (age of onset: 28) in his mother; Colon polyps in his brother.  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  Tobacco Use   Smoking status: Never   Smokeless tobacco: Never  Vaping Use   Vaping status: Never Used  Substance and Sexual Activity   Alcohol use: No    Alcohol/week: 0.0 standard drinks of alcohol    Comment: LAST USE 10 YRS AGO   Drug use: No   Sexual activity: Not on file  Other Topics Concern   Not on file  Social History Narrative   Not on file   Social Determinants of Health   Financial Resource Strain: Low Risk  (08/14/2021)   Overall Financial Resource Strain (CARDIA)    Difficulty of Paying Living Expenses: Not hard at all  Food Insecurity: No Food Insecurity (08/17/2022)   Hunger Vital Sign    Worried About Running Out of Food in the Last Year: Never true    Ran Out of Food in the Last Year: Never true  Transportation Needs: No Transportation Needs (08/17/2022)   PRAPARE -  Administrator, Civil Service (Medical): No    Lack of Transportation (Non-Medical): No  Physical Activity: Not on file  Stress: No Stress Concern Present (08/14/2021)   [employer] of Occupational Health - Occupational Stress Questionnaire    Feeling of Stress : Not at all  Social Connections: Moderately Integrated (08/14/2021)   Social Connection and Isolation Panel [NHANES]    Frequency of Communication with Friends and Family: More than three times a week    Frequency of Social Gatherings with Friends and Family: More than three times a week    Attends Religious Services: More than 4 times per year    Active Member of [employer] or Organizations: No    Attends Banker Meetings: Never    Marital Status: Married  Catering manager Violence: Not At Risk (08/17/2022)   Humiliation, Afraid, Rape, and Kick questionnaire    Fear of Current or Ex-Partner: No    Emotionally Abused: No    Physically Abused: No    Sexually Abused: No    Lab Results  Component Value Date   HGBA1C 6.8 (A) 11/24/2022   Lab Results  Component Value Date   CHOL 143 08/18/2022   Lab Results  Component Value Date   HDL 40.80 08/18/2022   Lab Results  Component Value Date   LDLCALC 81 08/18/2022  Lab Results  Component Value Date   TRIG 106.0 08/18/2022   Lab Results  Component Value Date   CHOLHDL 3 08/18/2022   Lab Results  Component Value Date   CREATININE 2.03 (H) 08/18/2022   Lab Results  Component Value Date   GFR 33.04 (L) 08/18/2022   Lab Results  Component Value Date   MICROALBUR 2.8 (H) 08/18/2022      Component Value Date/Time   NA 137 08/18/2022 1001   K 4.1 08/18/2022 1001   CL 102 08/18/2022 1001   CO2 25 08/18/2022 1001   GLUCOSE 130 (H) 08/18/2022 1001   BUN 19 08/18/2022 1001   CREATININE 2.03 (H) 08/18/2022 1001   CREATININE 2.21 (H) 10/18/2019 1600   CALCIUM 9.5 08/18/2022 1001   PROT 7.6 08/18/2022 1001   ALBUMIN 4.2 08/18/2022 1001   AST  21 08/18/2022 1001   ALT 26 08/18/2022 1001   ALKPHOS 56 08/18/2022 1001   BILITOT 0.4 08/18/2022 1001   GFRNONAA 38 (L) 05/17/2019 0421   GFRAA 44 (L) 05/17/2019 0421      Latest Ref Rng & Units 08/18/2022   10:01 AM 05/14/2022    8:47 AM 04/13/2022   10:52 AM  BMP  Glucose 70 - 99 mg/dL 098  119  147   BUN 6 - 23 mg/dL 19   19   Creatinine 8.29 - 1.50 mg/dL 5.62   1.30   Sodium 865 - 145 mEq/L 137   136   Potassium 3.5 - 5.1 mEq/L 4.1   4.3   Chloride 96 - 112 mEq/L 102   102   CO2 19 - 32 mEq/L 25   22   Calcium 8.4 - 10.5 mg/dL 9.5   9.4        Component Value Date/Time   WBC 3.9 (L) 08/18/2022 1001   RBC 5.29 08/18/2022 1001   HGB 15.3 08/18/2022 1001   HCT 45.8 08/18/2022 1001   PLT 170.0 08/18/2022 1001   MCV 86.7 08/18/2022 1001   MCH 28.0 10/18/2019 1600   MCHC 33.3 08/18/2022 1001   RDW 13.7 08/18/2022 1001   LYMPHSABS 1.4 04/13/2022 1052   MONOABS 0.6 04/13/2022 1052   EOSABS 0.4 04/13/2022 1052   BASOSABS 0.0 04/13/2022 1052     Parts of this note may have been dictated using voice recognition software. There may be variances in spelling and vocabulary which are unintentional. Not all errors are proofread. Please notify the Thereasa Parkin if any discrepancies are noted or if the meaning of any statement is not clear.

## 2022-12-02 DIAGNOSIS — I129 Hypertensive chronic kidney disease with stage 1 through stage 4 chronic kidney disease, or unspecified chronic kidney disease: Secondary | ICD-10-CM | POA: Diagnosis not present

## 2022-12-02 DIAGNOSIS — N1832 Chronic kidney disease, stage 3b: Secondary | ICD-10-CM | POA: Diagnosis not present

## 2022-12-02 DIAGNOSIS — E871 Hypo-osmolality and hyponatremia: Secondary | ICD-10-CM | POA: Diagnosis not present

## 2022-12-02 DIAGNOSIS — N189 Chronic kidney disease, unspecified: Secondary | ICD-10-CM | POA: Diagnosis not present

## 2022-12-02 DIAGNOSIS — D631 Anemia in chronic kidney disease: Secondary | ICD-10-CM | POA: Diagnosis not present

## 2022-12-02 DIAGNOSIS — E872 Acidosis, unspecified: Secondary | ICD-10-CM | POA: Diagnosis not present

## 2022-12-02 DIAGNOSIS — E118 Type 2 diabetes mellitus with unspecified complications: Secondary | ICD-10-CM | POA: Diagnosis not present

## 2022-12-02 DIAGNOSIS — Z794 Long term (current) use of insulin: Secondary | ICD-10-CM | POA: Diagnosis not present

## 2022-12-02 DIAGNOSIS — R609 Edema, unspecified: Secondary | ICD-10-CM | POA: Diagnosis not present

## 2022-12-21 ENCOUNTER — Telehealth: Payer: Self-pay

## 2022-12-21 NOTE — Telephone Encounter (Signed)
I called and LVM for Mr Unice Cobble that his patient assistance Ozempic 4mg /31ml #4 boxes is here at this office ready to be picked up

## 2022-12-22 ENCOUNTER — Ambulatory Visit: Payer: Medicare Other | Admitting: Registered"

## 2022-12-22 NOTE — Telephone Encounter (Signed)
Patient picked up Ozempic - was taken from refrigerator and given to him by Soledad Gerlach

## 2023-01-08 ENCOUNTER — Other Ambulatory Visit: Payer: Self-pay | Admitting: Family

## 2023-02-04 DIAGNOSIS — E1140 Type 2 diabetes mellitus with diabetic neuropathy, unspecified: Secondary | ICD-10-CM | POA: Diagnosis not present

## 2023-02-15 ENCOUNTER — Other Ambulatory Visit: Payer: Self-pay | Admitting: "Endocrinology

## 2023-02-15 ENCOUNTER — Other Ambulatory Visit: Payer: Self-pay | Admitting: Family

## 2023-02-16 ENCOUNTER — Encounter: Payer: Self-pay | Admitting: "Endocrinology

## 2023-02-16 ENCOUNTER — Other Ambulatory Visit: Payer: Self-pay

## 2023-02-16 DIAGNOSIS — E1122 Type 2 diabetes mellitus with diabetic chronic kidney disease: Secondary | ICD-10-CM

## 2023-02-16 MED ORDER — DAPAGLIFLOZIN PROPANEDIOL 10 MG PO TABS: 10.0000 mg | ORAL_TABLET | Freq: Every day | ORAL | Status: DC

## 2023-02-22 ENCOUNTER — Encounter: Payer: Self-pay | Admitting: "Endocrinology

## 2023-02-22 ENCOUNTER — Other Ambulatory Visit: Payer: Self-pay

## 2023-02-22 DIAGNOSIS — E1122 Type 2 diabetes mellitus with diabetic chronic kidney disease: Secondary | ICD-10-CM

## 2023-02-22 MED ORDER — DAPAGLIFLOZIN PROPANEDIOL 10 MG PO TABS: 10.0000 mg | ORAL_TABLET | Freq: Every day | ORAL | Status: DC

## 2023-02-23 ENCOUNTER — Other Ambulatory Visit: Payer: Self-pay

## 2023-02-23 DIAGNOSIS — Z794 Long term (current) use of insulin: Secondary | ICD-10-CM

## 2023-02-23 MED ORDER — DAPAGLIFLOZIN PROPANEDIOL 10 MG PO TABS
10.0000 mg | ORAL_TABLET | Freq: Every day | ORAL | 1 refills | Status: DC
Start: 2023-02-23 — End: 2023-03-28

## 2023-03-03 ENCOUNTER — Telehealth: Payer: Self-pay | Admitting: "Endocrinology

## 2023-03-03 NOTE — Telephone Encounter (Addendum)
Patient came in to office today and brought 3 applications in to be completed along with financial information.  The 3 applications are: 1)  [employer], [employer] Patient Assistance Program Application 2)  [employer] Patient Assistance Program 3)  [place] Application For [employer]  All 3 applications and financial information are in Dr. Grafton Folk folder in the front office.

## 2023-03-07 NOTE — Telephone Encounter (Signed)
All three have been signed and faxed to each company

## 2023-03-19 ENCOUNTER — Other Ambulatory Visit: Payer: Self-pay | Admitting: Family

## 2023-03-22 ENCOUNTER — Other Ambulatory Visit: Payer: Self-pay

## 2023-03-22 ENCOUNTER — Other Ambulatory Visit: Payer: Medicare Other

## 2023-03-22 DIAGNOSIS — E1122 Type 2 diabetes mellitus with diabetic chronic kidney disease: Secondary | ICD-10-CM | POA: Diagnosis not present

## 2023-03-22 DIAGNOSIS — E7800 Pure hypercholesterolemia, unspecified: Secondary | ICD-10-CM | POA: Diagnosis not present

## 2023-03-22 DIAGNOSIS — N181 Chronic kidney disease, stage 1: Secondary | ICD-10-CM | POA: Diagnosis not present

## 2023-03-23 LAB — COMPREHENSIVE METABOLIC PANEL
AG Ratio: 1.4 (calc) (ref 1.0–2.5)
ALT: 32 U/L (ref 9–46)
AST: 20 U/L (ref 10–35)
Albumin: 4.2 g/dL (ref 3.6–5.1)
Alkaline phosphatase (APISO): 60 U/L (ref 35–144)
BUN/Creatinine Ratio: 11 (calc) (ref 6–22)
BUN: 19 mg/dL (ref 7–25)
CO2: 25 mmol/L (ref 20–32)
Calcium: 9.2 mg/dL (ref 8.6–10.3)
Chloride: 104 mmol/L (ref 98–110)
Creat: 1.8 mg/dL — ABNORMAL HIGH (ref 0.70–1.35)
Globulin: 2.9 g/dL (ref 1.9–3.7)
Glucose, Bld: 156 mg/dL — ABNORMAL HIGH (ref 65–99)
Potassium: 4.3 mmol/L (ref 3.5–5.3)
Sodium: 137 mmol/L (ref 135–146)
Total Bilirubin: 0.4 mg/dL (ref 0.2–1.2)
Total Protein: 7.1 g/dL (ref 6.1–8.1)

## 2023-03-23 LAB — LIPID PANEL
Cholesterol: 136 mg/dL (ref <200)
HDL: 51 mg/dL (ref >=40)
LDL Cholesterol (Calc): 71 mg/dL
Non-HDL Cholesterol (Calc): 85 mg/dL (ref <130)
Total CHOL/HDL Ratio: 2.7 (calc) (ref <5.0)
Triglycerides: 49 mg/dL (ref <150)

## 2023-03-23 LAB — HEMOGLOBIN A1C
Hgb A1c MFr Bld: 7.4 %{Hb} — ABNORMAL HIGH (ref <5.7)
Mean Plasma Glucose: 166 mg/dL
eAG (mmol/L): 9.2 mmol/L

## 2023-03-28 ENCOUNTER — Ambulatory Visit (INDEPENDENT_AMBULATORY_CARE_PROVIDER_SITE_OTHER): Payer: Medicare Other | Admitting: "Endocrinology

## 2023-03-28 ENCOUNTER — Encounter: Payer: Self-pay | Admitting: "Endocrinology

## 2023-03-28 VITALS — BP 126/82 | HR 74 | Ht 71.0 in | Wt 286.0 lb

## 2023-03-28 DIAGNOSIS — Z7985 Long-term (current) use of injectable non-insulin antidiabetic drugs: Secondary | ICD-10-CM | POA: Diagnosis not present

## 2023-03-28 DIAGNOSIS — Z7984 Long term (current) use of oral hypoglycemic drugs: Secondary | ICD-10-CM | POA: Diagnosis not present

## 2023-03-28 DIAGNOSIS — E1140 Type 2 diabetes mellitus with diabetic neuropathy, unspecified: Secondary | ICD-10-CM | POA: Diagnosis not present

## 2023-03-28 DIAGNOSIS — Z794 Long term (current) use of insulin: Secondary | ICD-10-CM | POA: Diagnosis not present

## 2023-03-28 DIAGNOSIS — E7800 Pure hypercholesterolemia, unspecified: Secondary | ICD-10-CM | POA: Diagnosis not present

## 2023-03-28 NOTE — Progress Notes (Signed)
Outpatient Endocrinology Note David Olney, MD  03/28/23   David Lindsey [birth year] [id number]  Referring Provider: Olive Lindsey,* Primary Care Provider: Olive Bass, FNP Reason for consultation: Subjective   Assessment & Plan  David Lindsey was seen today for follow-up.  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 Will recommend for the following change of medications to: Farxiga 10 mg daily, ozempic 2 mg weekly  (pt is on pt assistance for ozempic, waiting for 2 mg dose increment approval) Humalog 75/25 mix 16 units in a.m. 16 units before dinner, 15 min before eating (except 15 units bd the day pt takes ozempic to avoid lows as experienced by patient currently)  No known contraindications to any of above medications Glucagon ordered, 08/19/22  Hyperlipidemia -Last LDL near goal: 81 -on rosuvastatin 20 mg every day since 08/19/22 -Follow low fat diet and exercise   -Blood pressure goal <140/90 - Microalbumin/creatinine goal < 30 -On lisinopril 10 mg, sees nephrologist  -diet changes including salt restriction -limit eating outside -counseled BP targets per standards of diabetes care -Uncontrolled blood pressure can lead to retinopathy, nephropathy and cardiovascular and atherosclerotic heart disease  Reviewed and counseled on: -A1C target -Blood sugar targets -Complications of uncontrolled diabetes  -Checking blood sugar before meals and bedtime and bring log next visit -All medications with mechanism of action and side effects -Hypoglycemia management: rule of 15's, Glucagon Emergency Kit and medical alert  ID -low-carb low-fat plate-method diet -At least 20 minutes of physical activity per day -Annual dilated retinal eye exam and foot exam -compliance and follow up needs -follow up as scheduled or earlier if problem gets worse  Call if blood sugar is less than 70 or consistently above 250    Take a 15 gm snack of carbohydrate at bedtime before you go to sleep if your blood sugar is less than 100.    If you are going to fast after midnight for a test or procedure, ask your physician for instructions on how to reduce/decrease your insulin dose.    Call if blood sugar is less than 70 or consistently above 250  -Treating a low sugar by rule of 15  (15 gms of sugar every 15 min until sugar is more than 70) If you feel your sugar is low, test your sugar to be sure If your sugar is low (less than 70), then take 15 grams of a fast acting Carbohydrate (3-4 glucose tablets or glucose gel or 4 ounces of juice or regular soda) Recheck your sugar 15 min after treating low to make sure it is more than 70 If sugar is still less than 70, treat again with 15 grams of carbohydrate          Don't drive the hour of hypoglycemia  If unconscious/unable to eat or drink by mouth, use glucagon injection or nasal spray baqsimi and call 911. Can repeat again in 15 min if still unconscious.  Return in about 3 months (around 06/26/2023).   I have reviewed current medications, nurse's notes, allergies, vital signs, past medical and surgical history, family medical history, and social history for this encounter. Counseled patient on symptoms, examination findings, lab findings, imaging  results, treatment decisions and monitoring and prognosis. The patient understood the recommendations and agrees with the treatment plan. All questions regarding treatment plan were fully answered.  David Albemarle, MD  03/28/23   History of Present Illness David Lindsey is a 67 y.o. year old male who presents for evaluation of Type 2  diabetes mellitus.  David Lindsey was first diagnosed in 2013.   Diabetes education +  Home diabetes regimen: Non-insulin hypoglycemic drugs: Farxiga 10 mg daily, ozempic 1 mg weekly   Insulin regimen: Humalog 75/25 mix 16 units in a.m. 16 units before dinner, right before eating   No history of MEN syndrome/medullary thyroid cancer/pancreatitis or pancreatic cancer in self or family No UTI  Previous history:  Non-insulin hypoglycemic drugs previously used: Metformin, Invokana, Amaryl, Prandin, victoza Metformin stopped in 2019 Farxiga started in 2021 replacing Invokana Insulin was started in 5/20, generally has been on premixed insulin  COMPLICATIONS -  MI/Stroke -  retinopathy, last eye exam 2024 +  neuropathy +  nephropathy, GFR 33  BLOOD SUGAR DATA  CGM interpretation: At today's visit, we reviewed her CGM downloads. The full report is scanned in the media. Reviewing the CGM trends, BG are well controlled across the day, with few highs.  Physical Exam  BP 126/82 (BP Location: Left Arm, Patient Position: Sitting, Cuff Size: Large)   Pulse 74   Ht 5\' 11"  (1.803 m)   Wt 286 lb (129.7 kg)   SpO2 96%   BMI 39.89 kg/m    Constitutional: well developed, well nourished Head: normocephalic, atraumatic Eyes: sclera anicteric, no redness Neck: supple Lungs: normal respiratory effort Neurology: alert and oriented Skin: dry, no appreciable rashes Musculoskeletal: no appreciable defects Psychiatric: normal mood and affect Diabetic Foot Exam - Simple   No data filed      Current Medications Patient's Medications  New Prescriptions   No medications on file  Previous Medications   AMLODIPINE (NORVASC) 10 MG TABLET    Take 1 tablet (10 mg total) by mouth daily.   APPLE CIDER VINEGAR 600 MG CAPS    Take 600 mg by mouth daily.   BIOTIN W/ VITAMINS C & E (HAIR SKIN & NAILS GUMMIES PO)    Take 1 tablet by mouth every evening.   CARBOXYMETHYLCELLUL-GLYCERIN (LUBRICATING EYE  DROPS OP)    Place 1 drop into both eyes daily as needed (dry eyes).    CHOLECALCIFEROL (VITAMIN D3) 25 MCG (1000 UNIT) TABLET    Take 1,000 Units by mouth daily.   CLOTRIMAZOLE-BETAMETHASONE (LOTRISONE) CREAM    APPLY CREAM TOPICALLY THREE TIMES DAILY AS NEEDED FOR RASH   COENZYME Q10 (COQ-10) 200 MG CAPS    Take 200 mg by mouth every evening.    COMIRNATY SYRINGE       CONTINUOUS BLOOD GLUC SENSOR (FREESTYLE LIBRE 2 SENSOR) MISC    1 Device by Does not apply route every 14 (fourteen) days.   CONTINUOUS BLOOD GLUC SENSOR (FREESTYLE LIBRE 3 SENSOR) MISC    by Does not apply route.   CYANOCOBALAMIN (B-12) 5000 MCG CAPS    Take 5,000 mcg by mouth every evening.    DAPAGLIFLOZIN PROPANEDIOL (FARXIGA) 10 MG TABS TABLET    Take 1 tablet (10 mg total) by mouth daily.   DICLOFENAC SODIUM (VOLTAREN) 1 % GEL    Apply topically 4 (four) times daily.   DOCUSATE SODIUM (COLACE) 100 MG CAPSULE    Take 200 mg by mouth every evening.    FLUZONE HIGH-DOSE 0.5 ML INJECTION  GABAPENTIN (NEURONTIN) 300 MG CAPSULE    TAKE 1 CAPSULE BY MOUTH 3 TIMES  DAILY   GLUCAGON (GVOKE HYPOPEN 1-PACK) 1 MG/0.2ML SOAJ    INJECT 1 MG INTO THE SKIN AS NEEDED (LOW BLOOD SUAGR WITH IMPAIRED CONSCIOUSNESS).   GLUCOSE BLOOD (ONETOUCH ULTRA) TEST STRIP    Use as instructed Check blood sugars on waking up 3 days a week   GLUCOSE BLOOD TEST STRIP    Use as instructed twice a day at different times   INSULIN LISPRO PROTAMINE-LISPRO (HUMALOG 75/25 MIX) (75-25) 100 UNIT/ML SUSP INJECTION    Inject 16 Units into the skin 2 (two) times daily with a meal.   LISINOPRIL (ZESTRIL) 10 MG TABLET    Take 1 tablet (10 mg total) by mouth daily.   MENTHOL, TOPICAL ANALGESIC, 8 % LIQD    Apply 1 application topically 3 (three) times daily as needed (for knee pain.).    METHYLCELLULOSE, LAXATIVE, 500 MG TABS    Take 500 mg by mouth daily. Fiberwell Gummies   METOPROLOL SUCCINATE (TOPROL-XL) 25 MG 24 HR TABLET    Take 0.5 tablets (12.5 mg total) by  mouth at bedtime.   MM PEN NEEDLES 31G X 8 MM MISC    AS DIRECTED WITH  VICTOZA   MULTIPLE VITAMIN (MULTIVITAMIN WITH MINERALS) TABS TABLET    Take 1 tablet by mouth every evening.    OMEGA-3-6-9 CAPS    Take 1 capsule by mouth every evening. 1600mg    OMEPRAZOLE (PRILOSEC) 20 MG CAPSULE    Take 1 capsule (20 mg total) by mouth daily.   ROSUVASTATIN (CRESTOR) 20 MG TABLET    TAKE 1 TABLET BY MOUTH EVERY DAY   SEMAGLUTIDE, 2 MG/DOSE, 8 MG/3ML SOPN    Inject 2 mg as directed once a week.   SILDENAFIL (VIAGRA) 100 MG TABLET    Take 1 tablet (100 mg total) by mouth daily as needed.   SODIUM BICARBONATE PO    Take 1 tablet by mouth 2 (two) times daily.   TRAZODONE (DESYREL) 100 MG TABLET    Take 1 tablet (100 mg total) by mouth at bedtime.   TROLAMINE SALICYLATE (ASPERCREME) 10 % CREAM    Apply 1 application topically as needed (knee pain).   VITAMIN E 400 UNIT CAPSULE    Take 400 Units by mouth every evening.   Modified Medications   No medications on file  Discontinued Medications   DAPAGLIFLOZIN PROPANEDIOL (FARXIGA) 10 MG TABS TABLET    Take 1 tablet (10 mg total) by mouth daily before breakfast.    Allergies Allergies  Allergen Reactions   Pioglitazone Swelling    Past Medical History Past Medical History:  Diagnosis Date   ALCOHOLISM 12/22/2007   ANEMIA, IRON DEFICIENCY 12/26/2007   Arthritis    OA   Blood transfusion without reported diagnosis    as a child   DIABETES MELLITUS, TYPE II 11/10/2006   ESOPHAGITIS, REFLUX 12/22/2007   GERD (gastroesophageal reflux disease)    HYPERCHOLESTEROLEMIA 12/22/2007   HYPERTENSION 11/10/2006   HYPOGONADISM 12/22/2007   Prob. due to ETOH   HYPOKALEMIA 06/06/2008   Leukopenia    Neuromuscular disorder (HCC)    neuropathy in feet from Diabetes   Neuropathy    Chronic Neuropathy BOTH FEET   Pancytopenia 06/06/2008   RENAL INSUFFICIENCY 11/10/2006   SEES [place] KIDNEY STAGE 2 CKD    Past Surgical History Past Surgical History:  Procedure  Laterality Date   APPENDECTOMY  AGE 57 OR 10  COLONOSCOPY  08-21-2004/2017   [facility]   COLONSCOPY  2017   ELECTROCARDIOGRAM  11/07/2006   ESOPHAGOGASTRODUODENOSCOPY  08/20/2004   EYE SURGERY Bilateral 2014   IOC FOR CATARACTS   I & D KNEE WITH POLY EXCHANGE Left 05/16/2019   Procedure: LEFT KNEE IRRIGATION AND DEBRIDEMENT WITH POLY EXCHANGE;  Surgeon: Gean Birchwood, MD;  Location: [facility];  Service: Orthopedics;  Laterality: Left;   REPLACEMENT TOTAL KNEE Right 2011   TOTAL KNEE ARTHROPLASTY Left 02/20/2018   Procedure: LEFT TOTAL KNEE ARTHROPLASTY;  Surgeon: Gean Birchwood, MD;  Location: [facility];  Service: Orthopedics;  Laterality: Left;   UPPER GASTROINTESTINAL ENDOSCOPY      Family History family history includes Cancer in his mother; Colon cancer (age of onset: 4) in his mother; Colon polyps in his brother.  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  Tobacco Use   Smoking status: Never   Smokeless tobacco: Never  Vaping Use   Vaping status: Never Used  Substance and Sexual Activity   Alcohol use: No    Alcohol/week: 0.0 standard drinks of alcohol    Comment: LAST USE 10 YRS AGO   Drug use: No   Sexual activity: Not on file  Other Topics Concern   Not on file  Social History Narrative   Not on file   Social Drivers of Health   Financial Resource Strain: Low Risk  (08/14/2021)   Overall Financial Resource Strain (CARDIA)    Difficulty of Paying Living Expenses: Not hard at all  Food Insecurity: No Food Insecurity (08/17/2022)   Hunger Vital Sign    Worried About Running Out of Food in the Last Year: Never true    Ran Out of Food in the Last Year: Never true  Transportation Needs: No Transportation Needs  (08/17/2022)   PRAPARE - Administrator, Civil Service (Medical): No    Lack of Transportation (Non-Medical): No  Physical Activity: Not on file  Stress: No Stress Concern Present (08/14/2021)   [employer] of Occupational Health - Occupational Stress Questionnaire    Feeling of Stress : Not at all  Social Connections: Moderately Integrated (08/14/2021)   Social Connection and Isolation Panel [NHANES]    Frequency of Communication with Friends and Family: More than three times a week    Frequency of Social Gatherings with Friends and Family: More than three times a week    Attends Religious Services: More than 4 times per year    Active Member of [employer] or Organizations: No    Attends Banker Meetings: Never    Marital Status: Married  Catering manager Violence: Not At Risk (08/17/2022)   Humiliation, Afraid, Rape, and Kick questionnaire    Fear of Current or Ex-Partner: No    Emotionally Abused: No    Physically Abused: No    Sexually Abused: No    Lab Results  Component Value Date   HGBA1C 7.4 (H) 03/22/2023   Lab Results  Component Value Date   CHOL 136 03/22/2023   Lab Results  Component Value Date   HDL 51 03/22/2023   Lab Results  Component Value Date   LDLCALC 71 03/22/2023  Lab Results  Component Value Date   TRIG 49 03/22/2023   Lab Results  Component Value Date   CHOLHDL 2.7 03/22/2023   Lab Results  Component Value Date   CREATININE 1.80 (H) 03/22/2023   Lab Results  Component Value Date   GFR 33.04 (L) 08/18/2022   Lab Results  Component Value Date   MICROALBUR 2.8 (H) 08/18/2022      Component Value Date/Time   NA 137 03/22/2023 0810   K 4.3 03/22/2023 0810   CL 104 03/22/2023 0810   CO2 25 03/22/2023 0810   GLUCOSE 156 (H) 03/22/2023 0810   BUN 19 03/22/2023 0810   CREATININE 1.80 (H) 03/22/2023 0810   CALCIUM 9.2 03/22/2023 0810   PROT 7.1 03/22/2023 0810   ALBUMIN 4.2 08/18/2022 1001   AST 20 03/22/2023 0810    ALT 32 03/22/2023 0810   ALKPHOS 56 08/18/2022 1001   BILITOT 0.4 03/22/2023 0810   GFRNONAA 38 (L) 05/17/2019 0421   GFRAA 44 (L) 05/17/2019 0421      Latest Ref Rng & Units 03/22/2023    8:10 AM 08/18/2022   10:01 AM 05/14/2022    8:47 AM  BMP  Glucose 65 - 99 mg/dL 191  478  295   BUN 7 - 25 mg/dL 19  19    Creatinine 6.21 - 1.35 mg/dL 3.08  6.57    BUN/Creat Ratio 6 - 22 (calc) 11     Sodium 135 - 146 mmol/L 137  137    Potassium 3.5 - 5.3 mmol/L 4.3  4.1    Chloride 98 - 110 mmol/L 104  102    CO2 20 - 32 mmol/L 25  25    Calcium 8.6 - 10.3 mg/dL 9.2  9.5         Component Value Date/Time   WBC 3.9 (L) 08/18/2022 1001   RBC 5.29 08/18/2022 1001   HGB 15.3 08/18/2022 1001   HCT 45.8 08/18/2022 1001   PLT 170.0 08/18/2022 1001   MCV 86.7 08/18/2022 1001   MCH 28.0 10/18/2019 1600   MCHC 33.3 08/18/2022 1001   RDW 13.7 08/18/2022 1001   LYMPHSABS 1.4 04/13/2022 1052   MONOABS 0.6 04/13/2022 1052   EOSABS 0.4 04/13/2022 1052   BASOSABS 0.0 04/13/2022 1052     Parts of this note may have been dictated using voice recognition software. There may be variances in spelling and vocabulary which are unintentional. Not all errors are proofread. Please notify the Thereasa Parkin if any discrepancies are noted or if the meaning of any statement is not clear.

## 2023-03-28 NOTE — Patient Instructions (Signed)

## 2023-03-29 ENCOUNTER — Ambulatory Visit (INDEPENDENT_AMBULATORY_CARE_PROVIDER_SITE_OTHER): Payer: Medicare Other | Admitting: Family

## 2023-03-29 VITALS — BP 134/76 | HR 88 | Ht 71.0 in | Wt 285.2 lb

## 2023-03-29 DIAGNOSIS — M545 Low back pain, unspecified: Secondary | ICD-10-CM

## 2023-03-29 MED ORDER — METHOCARBAMOL 500 MG PO TABS: 500.0000 mg | ORAL_TABLET | Freq: Three times a day (TID) | ORAL | 0 refills | Status: DC | PRN

## 2023-03-29 NOTE — Progress Notes (Signed)
David Lindsey is a 67 y.o. 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   S/P total knee arthroplasty, left 02/20/2018   Degenerative arthritis of left knee 11/18/2017   Left shoulder pain 08/29/2017   History of total right knee replacement (TKR) 03/25/2017   Arthralgia 02/24/2017   Edema 01/13/2017   Wellness examination 01/30/2015   Myalgia and myositis 01/28/2014   Constipation 03/13/2013   Screening for prostate cancer 01/18/2013   Diabetes mellitus with renal manifestation (HCC) 01/18/2013   Encounter for long-term (current) use of other medications 03/24/2012   HYPOKALEMIA 06/06/2008   Pancytopenia (HCC) 06/06/2008   Hypogonadism, male 12/22/2007   HYPERCHOLESTEROLEMIA 12/22/2007   ALCOHOLISM 12/22/2007   ESOPHAGITIS, REFLUX 12/22/2007   Leg pain 12/22/2007   Essential hypertension 11/10/2006   Disorder resulting from impaired renal function 11/10/2006    Current Outpatient Medications  Medication Sig Dispense Refill   amLODipine (NORVASC) 10 MG tablet Take 1 tablet (10 mg total) by mouth daily. 90 tablet 0   Apple Cider Vinegar 600 MG CAPS Take 600 mg by mouth daily.     Biotin w/ Vitamins C & E (HAIR SKIN & NAILS GUMMIES PO) Take 1 tablet by mouth every evening.     Carboxymethylcellul-Glycerin (LUBRICATING EYE DROPS OP) Place 1 drop into both eyes daily as needed (dry eyes).      cholecalciferol (VITAMIN D3) 25 MCG (1000 UNIT) tablet Take 1,000 Units by mouth daily.     clotrimazole-betamethasone (LOTRISONE) cream APPLY CREAM TOPICALLY THREE TIMES DAILY AS NEEDED FOR RASH 45 g 0   Coenzyme Q10 (COQ-10) 200 MG CAPS Take 200 mg by mouth every evening.      COMIRNATY syringe      Continuous Blood Gluc Sensor (FREESTYLE LIBRE 2 SENSOR) MISC 1 Device by Does not apply route every 14  (fourteen) days. 6 each 3   Continuous Blood Gluc Sensor (FREESTYLE LIBRE 3 SENSOR) MISC by Does not apply route.     Cyanocobalamin (B-12) 5000 MCG CAPS Take 5,000 mcg by mouth every evening.      dapagliflozin propanediol (FARXIGA) 10 MG TABS tablet Take 1 tablet (10 mg total) by mouth daily.     diclofenac Sodium (VOLTAREN) 1 % GEL Apply topically 4 (four) times daily.     docusate sodium (COLACE) 100 MG capsule Take 200 mg by mouth every evening.      FLUZONE HIGH-DOSE 0.5 ML injection      gabapentin (NEURONTIN) 300 MG capsule TAKE 1 CAPSULE BY MOUTH 3 TIMES  DAILY 300 capsule 2   Glucagon (GVOKE HYPOPEN 1-PACK) 1 MG/0.2ML SOAJ INJECT 1 MG INTO THE SKIN AS NEEDED (LOW BLOOD SUAGR WITH IMPAIRED CONSCIOUSNESS). 0.4 mL 2   glucose blood (ONETOUCH ULTRA) test strip Use as instructed Check blood sugars on waking up 3 days a week 100 each 12   glucose blood test strip Use as instructed twice a day at different times 50 each 12   insulin lispro protamine-lispro (HUMALOG 75/25 MIX) (75-25) 100 UNIT/ML SUSP injection Inject 16 Units into the skin 2 (two) times daily with a meal.     lisinopril (ZESTRIL) 10 MG tablet Take 1 tablet (10 mg total) by mouth daily. 90 tablet 0   Menthol, Topical Analgesic, 8 % LIQD Apply 1 application topically 3 (three) times  daily as needed (for knee pain.).      methocarbamol (ROBAXIN) 500 MG tablet Take 1 tablet (500 mg total) by mouth every 8 (eight) hours as needed for muscle spasms. 30 tablet 0   Methylcellulose, Laxative, 500 MG TABS Take 500 mg by mouth daily. Fiberwell Gummies     metoprolol succinate (TOPROL-XL) 25 MG 24 hr tablet Take 0.5 tablets (12.5 mg total) by mouth at bedtime. 45 tablet 0   MM PEN NEEDLES 31G X 8 MM MISC AS DIRECTED WITH  VICTOZA 100 each 0   Multiple Vitamin (MULTIVITAMIN WITH MINERALS) TABS tablet Take 1 tablet by mouth every evening.      Omega-3-6-9 CAPS Take 1 capsule by mouth every evening. 1600mg      omeprazole (PRILOSEC) 20 MG  capsule Take 1 capsule (20 mg total) by mouth daily. 90 capsule 0   rosuvastatin (CRESTOR) 20 MG tablet TAKE 1 TABLET BY MOUTH EVERY DAY 90 tablet 1   Semaglutide, 2 MG/DOSE, 8 MG/3ML SOPN Inject 2 mg as directed once a week. 3 mL 2   sildenafil (VIAGRA) 100 MG tablet Take 1 tablet (100 mg total) by mouth daily as needed. 18 tablet 3   SODIUM BICARBONATE PO Take 1 tablet by mouth 2 (two) times daily.     traZODone (DESYREL) 100 MG tablet Take 1 tablet (100 mg total) by mouth at bedtime. 100 tablet 3   trolamine salicylate (ASPERCREME) 10 % cream Apply 1 application topically as needed (knee pain).     vitamin E 400 UNIT capsule Take 400 Units by mouth every evening.      No current facility-administered medications for this visit.    Allergies: Pioglitazone  Past Medical History:  Diagnosis Date   ALCOHOLISM 12/22/2007   ANEMIA, IRON DEFICIENCY 12/26/2007   Arthritis    OA   Blood transfusion without reported diagnosis    as a child   DIABETES MELLITUS, TYPE II 11/10/2006   ESOPHAGITIS, REFLUX 12/22/2007   GERD (gastroesophageal reflux disease)    HYPERCHOLESTEROLEMIA 12/22/2007   HYPERTENSION 11/10/2006   HYPOGONADISM 12/22/2007   Prob. due to ETOH   HYPOKALEMIA 06/06/2008   Leukopenia    Neuromuscular disorder (HCC)    neuropathy in feet from Diabetes   Neuropathy    Chronic Neuropathy BOTH FEET   Pancytopenia 06/06/2008   RENAL INSUFFICIENCY 11/10/2006   SEES [place] KIDNEY STAGE 2 CKD    Past Surgical History:  Procedure Laterality Date   APPENDECTOMY  AGE 40 OR 10   COLONOSCOPY  08-21-2004/2017   [facility]   COLONSCOPY  2017   ELECTROCARDIOGRAM  11/07/2006   ESOPHAGOGASTRODUODENOSCOPY  08/20/2004   EYE SURGERY Bilateral 2014   IOC FOR CATARACTS   I & D KNEE WITH POLY EXCHANGE Left 05/16/2019   Procedure: LEFT KNEE IRRIGATION AND DEBRIDEMENT WITH POLY EXCHANGE;  Surgeon: Gean Birchwood, MD;  Location: [facility];  Service: Orthopedics;  Laterality: Left;   REPLACEMENT  TOTAL KNEE Right 2011   TOTAL KNEE ARTHROPLASTY Left 02/20/2018   Procedure: LEFT TOTAL KNEE ARTHROPLASTY;  Surgeon: Gean Birchwood, MD;  Location: [facility];  Service: Orthopedics;  Laterality: Left;   UPPER GASTROINTESTINAL ENDOSCOPY      Family History  Problem Relation Age of Onset   Cancer Mother        Colon Cancer   Colon cancer Mother 69   Colon polyps Brother    Esophageal cancer Neg Hx    Rectal cancer Neg Hx    Stomach cancer  Neg Hx     Social History   Tobacco Use   Smoking status: Never   Smokeless tobacco: Never  Substance Use Topics   Alcohol use: No    Alcohol/week: 0.0 standard drinks of alcohol    Comment: LAST USE 10 YRS AGO    Subjective:   1 week history of low back pain; seemed to start after moving some Christmas decorations; symptoms most noticeable when waking in the morning; does respond to the heat; has been taking Tylenol with limited relief; no numbness/ tingling; cannot take NSAIDs; no changes in bowel or bladder habits;   Objective:  Vitals:   03/29/23 0947  BP: 134/76  Pulse: 88  SpO2: 98%  Weight: 285 lb 3.2 oz (129.4 kg)  Height: 5\' 11"  (1.803 m)    General: Well developed, well nourished, in no acute distress  Skin : Warm and dry.  Head: Normocephalic and atraumatic  Lungs: Respirations unlabored; clear to auscultation bilaterally without wheeze, rales, rhonchi  CVS exam: normal rate and regular rhythm.  Musculoskeletal: No deformities; no active joint inflammation  Extremities: No edema, cyanosis, clubbing  Vessels: Symmetric bilaterally  Neurologic: Alert and oriented; speech intact; face symmetrical; moves all extremities well; CNII-XII intact without focal deficit   Assessment:  1. Low back pain without sciatica, unspecified back pain laterality, unspecified chronicity     Plan:  Suspect muscle spasm; continue heat and Tylenol as needed; Rx for Robaxin 500 mg tid prn- he will definitely use at night; follow worse, no better.   No  follow-ups on file.  No orders of the defined types were placed in this encounter.   Requested Prescriptions   Signed Prescriptions Disp Refills   methocarbamol (ROBAXIN) 500 MG tablet 30 tablet 0    Sig: Take 1 tablet (500 mg total) by mouth every 8 (eight) hours as needed for muscle spasms.

## 2023-04-04 ENCOUNTER — Other Ambulatory Visit (HOSPITAL_BASED_OUTPATIENT_CLINIC_OR_DEPARTMENT_OTHER): Payer: Self-pay

## 2023-04-04 ENCOUNTER — Ambulatory Visit (INDEPENDENT_AMBULATORY_CARE_PROVIDER_SITE_OTHER): Payer: Medicare Other | Admitting: Medical

## 2023-04-04 VITALS — BP 132/86 | HR 97 | Resp 18 | Ht 71.0 in | Wt 283.4 lb

## 2023-04-04 DIAGNOSIS — M545 Low back pain, unspecified: Secondary | ICD-10-CM

## 2023-04-04 MED ORDER — METHYLPREDNISOLONE 4 MG PO TABS
ORAL_TABLET | ORAL | 0 refills | Status: DC
  Filled 2023-04-04: qty 10, 4d supply, fill #0

## 2023-04-04 NOTE — Patient Instructions (Addendum)
Low Back Pain Two weeks of low back pain, likely due to lifting boxes during Christmas decoration. Pain is severe in the morning (8-9/10) and decreases to moderate (5/10) by mid-morning. No radicular symptoms. Pain is exacerbated by movement of feet while driving. Physical exam consistent with mechanical low back pain. -Continue Tylenol and Robaxin as prescribed.  -Start low-dose Medrol taper.(Benefits vs risk of med explained. -Adjust Humalog insulin as needed if blood sugars rise above 250.( [place] on humalog 75/25  16 units in am and pm. if sugars are increasing over 250 then 17 units in am and 17 units in pm. Try strict low sugar diet first without increasing along with current insulin regimen  -Continue heat application. -Print out back stretching exercises for patient to do as tolerated. -Consider x-rays or referral to sports medicine if pain persists.   Follow up with Vernona Rieger in one week or sooner if needed.  Back Exercises These exercises help to make your trunk and back strong. They also help to keep the lower back flexible. Doing these exercises can help to prevent or lessen pain in your lower back. If you have back pain, try to do these exercises 2-3 times each day or as told by your doctor. As you get better, do the exercises once each day. Repeat the exercises more often as told by your doctor. To stop back pain from coming back, do the exercises once each day, or as told by your doctor. Do exercises exactly as told by your doctor. Stop right away if you feel sudden pain or your pain gets worse. Exercises Single knee to chest Do these steps 3-5 times in a row for each leg: Lie on your back on a firm bed or the floor with your legs stretched out. Bring one knee to your chest. Grab your knee or thigh with both hands and hold it in place. Pull on your knee until you feel a gentle stretch in your lower back or butt. Keep doing the stretch for 10-30 seconds. Slowly let go of your  leg and straighten it. Pelvic tilt Do these steps 5-10 times in a row: Lie on your back on a firm bed or the floor with your legs stretched out. Bend your knees so they point up to the ceiling. Your feet should be flat on the floor. Tighten your lower belly (abdomen) muscles to press your lower back against the floor. This will make your tailbone point up to the ceiling instead of pointing down to your feet or the floor. Stay in this position for 5-10 seconds while you gently tighten your muscles and breathe evenly. Cat-cow Do these steps until your lower back bends more easily: Get on your hands and knees on a firm bed or the floor. Keep your hands under your shoulders, and keep your knees under your hips. You may put padding under your knees. Let your head hang down toward your chest. Tighten (contract) the muscles in your belly. Point your tailbone toward the floor so your lower back becomes rounded like the back of a cat. Stay in this position for 5 seconds. Slowly lift your head. Let the muscles of your belly relax. Point your tailbone up toward the ceiling so your back forms a sagging arch like the back of a cow. Stay in this position for 5 seconds.  Press-ups Do these steps 5-10 times in a row: Lie on your belly (face-down) on a firm bed or the floor. Place your hands near your head,  about shoulder-width apart. While you keep your back relaxed and keep your hips on the floor, slowly straighten your arms to raise the top half of your body and lift your shoulders. Do not use your back muscles. You may change where you place your hands to make yourself more comfortable. Stay in this position for 5 seconds. Keep your back relaxed. Slowly return to lying flat on the floor.  Bridges Do these steps 10 times in a row: Lie on your back on a firm bed or the floor. Bend your knees so they point up to the ceiling. Your feet should be flat on the floor. Your arms should be flat at your sides,  next to your body. Tighten your butt muscles and lift your butt off the floor until your waist is almost as high as your knees. If you do not feel the muscles working in your butt and the back of your thighs, slide your feet 1-2 inches (2.5-5 cm) farther away from your butt. Stay in this position for 3-5 seconds. Slowly lower your butt to the floor, and let your butt muscles relax. If this exercise is too easy, try doing it with your arms crossed over your chest. Belly crunches Do these steps 5-10 times in a row: Lie on your back on a firm bed or the floor with your legs stretched out. Bend your knees so they point up to the ceiling. Your feet should be flat on the floor. Cross your arms over your chest. Tip your chin a little bit toward your chest, but do not bend your neck. Tighten your belly muscles and slowly raise your chest just enough to lift your shoulder blades a tiny bit off the floor. Avoid raising your body higher than that because it can put too much stress on your lower back. Slowly lower your chest and your head to the floor. Back lifts Do these steps 5-10 times in a row: Lie on your belly (face-down) with your arms at your sides, and rest your forehead on the floor. Tighten the muscles in your legs and your butt. Slowly lift your chest off the floor while you keep your hips on the floor. Keep the back of your head in line with the curve in your back. Look at the floor while you do this. Stay in this position for 3-5 seconds. Slowly lower your chest and your face to the floor. Contact a doctor if: Your back pain gets a lot worse when you do an exercise. Your back pain does not get better within 2 hours after you exercise. If you have any of these problems, stop doing the exercises. Do not do them again unless your doctor says it is okay. Get help right away if: You have sudden, very bad back pain. If this happens, stop doing the exercises. Do not do them again unless your  doctor says it is okay. This information is not intended to replace advice given to you by your health care provider. Make sure you discuss any questions you have with your health care provider. Document Revised: 06/11/2020 Document Reviewed: 06/11/2020 Elsevier Patient Education  2024 [employer].

## 2023-04-04 NOTE — Progress Notes (Signed)
Subjective:    Patient ID: David Lindsey, male    DOB: 1956-01-06, 67 y.o.   MRN: [id number]  HPI Discussed the use of AI scribe software for clinical note transcription with the patient, who gave verbal consent to proceed.  History of Present Illness   The patient, with a history of diabetes and kidney disease, presents with a two-week history of back pain. The pain began after lifting boxes during Christmas decoration, with no reported injury or fall. The pain is most severe in the morning upon waking, rating it as 9-10/10, and gradually eases to a tolerable level of 5/10 by 10 am. The patient denies radiating pain to the legs but reports some discomfort in the back while moving feet during driving.  The patient has been managing the pain with Tylenol and a muscle relaxant (Robaxin) prescribed by a previous provider, which he started taking a day after the prescription due to pharmacy availability. The patient also uses a heat pad for relief. Despite these interventions, the patient reports no significant improvement in the pain, which seems to be worsening.        Review of Systems  Constitutional:  Negative for chills, fatigue and fever.  HENT:  Negative for congestion.   Respiratory:  Negative for cough, chest tightness and wheezing.   Cardiovascular:  Negative for chest pain and palpitations.  Gastrointestinal:  Negative for abdominal pain and rectal pain.  Genitourinary:  Negative for dysuria, flank pain and frequency.  Musculoskeletal:  Positive for back pain. Negative for neck pain and neck stiffness.  Skin:  Negative for rash.  Neurological:  Negative for dizziness, seizures, weakness and light-headedness.  Hematological:  Negative for adenopathy. Does not bruise/bleed easily.  Psychiatric/Behavioral:  Negative for behavioral problems and dysphoric mood.        Objective:   Physical Exam  General Appearance- Not in acute distress.    Chest and Lung  Exam Auscultation: Breath sounds:-Normal. Clear even and unlabored. Adventitious sounds:- No Adventitious sounds.  Cardiovascular Auscultation:Rythm - Regular, rate and rythm. Heart Sounds -Normal heart sounds.  Abdomen Inspection:-Inspection Normal.  Palpation/Perucssion: Palpation and Percussion of the abdomen reveal- Non Tender, No Rebound tenderness, No rigidity(Guarding) and No Palpable abdominal masses.  Liver:-Normal.  Spleen:- Normal.   Back Rt side  lumbar spine tenderness to palpation and sciatica Pain on straight leg lift. No mid spine tenderness.   Lower ext neurologic  L5-S1 sensation intact bilaterally. Normal patellar reflexes bilaterally. No foot drop bilaterally.       Assessment & Plan:   Assessment and Plan      Low Back Pain Two weeks of low back pain, likely due to lifting boxes during Christmas decoration. Pain is severe in the morning (8-9/10) and decreases to moderate (5/10) by mid-morning. No radicular symptoms. Pain is exacerbated by movement of feet while driving. Physical exam consistent with mechanical low back pain. -Continue Tylenol and Robaxin as prescribed.  -Start low-dose Medrol taper.(Benefits vs risk of med explained. -Adjust Humalog insulin as needed if blood sugars rise above 250.( [place] on humalog 75/25  16 units in am and pm. if sugars are increasing over 250 then 17 units in am and 17 units in pm. Try strict low sugar diet first without increasing along with current insulin regimen  -Continue heat application. -Print out back stretching exercises for patient to do as tolerated. -Consider x-rays or referral to sports medicine if pain persists.   Follow up with Vernona Rieger in one week or sooner if  needed.  Esperanza Richters, PA-C

## 2023-04-08 ENCOUNTER — Encounter: Payer: Self-pay | Admitting: Medical

## 2023-04-11 ENCOUNTER — Telehealth: Payer: Self-pay

## 2023-04-11 ENCOUNTER — Other Ambulatory Visit: Payer: Self-pay | Admitting: Family

## 2023-04-11 DIAGNOSIS — M545 Low back pain, unspecified: Secondary | ICD-10-CM

## 2023-04-11 NOTE — Telephone Encounter (Signed)
Spoke with pt, pt is aware and expressed understanding. Pt states he already has an appointment scheduled with [facility].

## 2023-04-11 NOTE — Telephone Encounter (Signed)
Copied from CRM [id number]. Topic: Clinical - Medication Refill >> Apr 08, 2023  3:34 PM Larwance Sachs wrote: Most Recent Primary Care Visit:  Provider: Esperanza Richters  Department: [facility]  Visit Type: ACUTE  Date: 04/04/2023  Medication: methylPREDNISolone (MEDROL) 4 MG tablet   Has the patient contacted their pharmacy? Yes, needs need Rx (Agent: If no, request that the patient contact the pharmacy for the refill. If patient does not wish to contact the pharmacy document the reason why and proceed with request.) (Agent: If yes, when and what did the pharmacy advise?)  Is this the correct pharmacy for this prescription? Yes If no, delete pharmacy and type the correct one.  This is the patient's preferred pharmacy:    [facility] [place] [facility] [address] [place] [address] Phone: [phone] Fax: [phone]   Has the prescription been filled recently? Yes, December 23 4 day supply  Is the patient out of the medication? Yes  Has the patient been seen for an appointment in the last year OR does the patient have an upcoming appointment? Yes  Can we respond through MyChart? Yes  Agent: Please be advised that Rx refills may take up to 3 business days. We ask that you follow-up with your pharmacy.

## 2023-04-11 NOTE — Telephone Encounter (Signed)
Spoke with pt, pt is aware and expressed understanding. Pt is requesting a referral be placed to [facility] as pt states he had his knee surgery through them. Pt states he will call back at a later time to cancel appointment at our office if he is able to get an appointment with sports med.

## 2023-04-11 NOTE — Telephone Encounter (Signed)
Pt has appointment with PCP 04/12/2023.

## 2023-04-12 ENCOUNTER — Ambulatory Visit: Payer: Medicare Other | Admitting: Family

## 2023-04-12 ENCOUNTER — Other Ambulatory Visit: Payer: Self-pay | Admitting: Family

## 2023-04-12 MED ORDER — METHOCARBAMOL 500 MG PO TABS: 500.0000 mg | ORAL_TABLET | Freq: Three times a day (TID) | ORAL | 0 refills | Status: DC | PRN

## 2023-04-17 ENCOUNTER — Other Ambulatory Visit: Payer: Self-pay | Admitting: Family

## 2023-04-17 ENCOUNTER — Other Ambulatory Visit: Payer: Self-pay | Admitting: "Endocrinology

## 2023-04-17 DIAGNOSIS — E1122 Type 2 diabetes mellitus with diabetic chronic kidney disease: Secondary | ICD-10-CM

## 2023-04-18 DIAGNOSIS — M545 Low back pain, unspecified: Secondary | ICD-10-CM | POA: Diagnosis not present

## 2023-04-19 ENCOUNTER — Encounter: Payer: Self-pay | Admitting: Family

## 2023-04-19 ENCOUNTER — Ambulatory Visit (INDEPENDENT_AMBULATORY_CARE_PROVIDER_SITE_OTHER): Payer: Medicare Other | Admitting: Family

## 2023-04-19 VITALS — BP 130/82 | HR 74 | Ht 71.0 in | Wt 286.6 lb

## 2023-04-19 DIAGNOSIS — Z125 Encounter for screening for malignant neoplasm of prostate: Secondary | ICD-10-CM

## 2023-04-19 DIAGNOSIS — Z0000 Encounter for general adult medical examination without abnormal findings: Secondary | ICD-10-CM | POA: Diagnosis not present

## 2023-04-19 LAB — PSA: PSA: 1.06 ng/mL (ref 0.10–4.00)

## 2023-04-19 NOTE — Progress Notes (Signed)
 David Lindsey is a 68 y.o. 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   S/P total knee arthroplasty, left 02/20/2018   Degenerative arthritis of left knee 11/18/2017   Left shoulder pain 08/29/2017   History of total right knee replacement (TKR) 03/25/2017   Arthralgia 02/24/2017   Edema 01/13/2017   Wellness examination 01/30/2015   Myalgia and myositis 01/28/2014   Constipation 03/13/2013   Screening for prostate cancer 01/18/2013   Diabetes mellitus with renal manifestation (HCC) 01/18/2013   Encounter for long-term (current) use of other medications 03/24/2012   HYPOKALEMIA 06/06/2008   Pancytopenia (HCC) 06/06/2008   Hypogonadism, male 12/22/2007   HYPERCHOLESTEROLEMIA 12/22/2007   ALCOHOLISM 12/22/2007   ESOPHAGITIS, REFLUX 12/22/2007   Leg pain 12/22/2007   Essential hypertension 11/10/2006   Disorder resulting from impaired renal function 11/10/2006    Current Outpatient Medications  Medication Sig Dispense Refill   amLODipine  (NORVASC ) 10 MG tablet TAKE 1 TABLET BY MOUTH EVERY DAY 90 tablet 3   Apple Cider Vinegar 600 MG CAPS Take 600 mg by mouth daily.     Biotin w/ Vitamins C & E (HAIR SKIN & NAILS GUMMIES PO) Take 1 tablet by mouth every evening.     Carboxymethylcellul-Glycerin  (LUBRICATING EYE DROPS OP) Place 1 drop into both eyes daily as needed (dry eyes).      cholecalciferol (VITAMIN D3) 25 MCG (1000 UNIT) tablet Take 1,000 Units by mouth daily.     clotrimazole -betamethasone  (LOTRISONE ) cream APPLY CREAM TOPICALLY THREE TIMES DAILY AS NEEDED FOR RASH 45 g 0   Coenzyme Q10 (COQ-10) 200 MG CAPS Take 200 mg by mouth every evening.      COMIRNATY syringe      Continuous Blood Gluc Sensor (FREESTYLE LIBRE 2 SENSOR) MISC 1 Device by Does not apply route every 14 (fourteen)  days. 6 each 3   Continuous Blood Gluc Sensor (FREESTYLE LIBRE 3 SENSOR) MISC by Does not apply route.     Cyanocobalamin (B-12) 5000 MCG CAPS Take 5,000 mcg by mouth every evening.      dapagliflozin  propanediol (FARXIGA ) 10 MG TABS tablet TAKE 1 TABLET BY MOUTH DAILY BEFORE BREAKFAST. 30 tablet 5   diclofenac Sodium (VOLTAREN) 1 % GEL Apply topically 4 (four) times daily.     docusate sodium  (COLACE) 100 MG capsule Take 200 mg by mouth every evening.      FLUZONE HIGH-DOSE 0.5 ML injection      gabapentin  (NEURONTIN ) 300 MG capsule TAKE 1 CAPSULE BY MOUTH 3 TIMES  DAILY 300 capsule 2   Glucagon  (GVOKE HYPOPEN  1-PACK) 1 MG/0.2ML SOAJ INJECT 1 MG INTO THE SKIN AS NEEDED (LOW BLOOD SUAGR WITH IMPAIRED CONSCIOUSNESS). 0.4 mL 2   glucose blood (ONETOUCH ULTRA) test strip Use as instructed Check blood sugars on waking up 3 days a week 100 each 12   glucose blood test strip Use as instructed twice a day at different times 50 each 12   insulin  lispro protamine-lispro (HUMALOG  75/25 MIX) (75-25) 100 UNIT/ML SUSP injection Inject 16 Units into the skin 2 (two) times daily with a meal.     lisinopril  (ZESTRIL ) 10 MG tablet TAKE 1 TABLET BY MOUTH EVERY DAY 90 tablet 3   Menthol , Topical Analgesic, 8 % LIQD Apply 1 application topically 3 (three) times daily as needed (for  knee pain.).      methocarbamol  (ROBAXIN ) 500 MG tablet Take 1 tablet (500 mg total) by mouth every 8 (eight) hours as needed for muscle spasms. 30 tablet 0   Methylcellulose, Laxative, 500 MG TABS Take 500 mg by mouth daily. Fiberwell Gummies     methylPREDNISolone  (MEDROL ) 4 MG tablet Take 4 tablets by mouth on day 1, 3 tablets on day 2, 2 tablets on day 3 and 1 tablet on day 4. 10 tablet 0   metoprolol  succinate (TOPROL -XL) 25 MG 24 hr tablet Take 0.5 tablets (12.5 mg total) by mouth at bedtime. 45 tablet 0   MM PEN NEEDLES 31G X 8 MM MISC AS DIRECTED WITH  VICTOZA  100 each 0   Multiple Vitamin (MULTIVITAMIN WITH MINERALS) TABS tablet  Take 1 tablet by mouth every evening.      Omega-3-6-9 CAPS Take 1 capsule by mouth every evening. 1600mg      omeprazole  (PRILOSEC) 20 MG capsule TAKE 1 CAPSULE BY MOUTH EVERY DAY 90 capsule 3   rosuvastatin  (CRESTOR ) 20 MG tablet TAKE 1 TABLET BY MOUTH EVERY DAY 90 tablet 1   Semaglutide , 2 MG/DOSE, 8 MG/3ML SOPN Inject 2 mg as directed once a week. 3 mL 2   sildenafil  (VIAGRA ) 100 MG tablet Take 1 tablet (100 mg total) by mouth daily as needed. 18 tablet 3   SODIUM BICARBONATE PO Take 1 tablet by mouth 2 (two) times daily.     traZODone  (DESYREL ) 100 MG tablet Take 1 tablet (100 mg total) by mouth at bedtime. 100 tablet 3   trolamine salicylate (ASPERCREME) 10 % cream Apply 1 application topically as needed (knee pain).     vitamin E 400 UNIT capsule Take 400 Units by mouth every evening.      No current facility-administered medications for this visit.    Allergies: Pioglitazone  Past Medical History:  Diagnosis Date   ALCOHOLISM 12/22/2007   ANEMIA, IRON DEFICIENCY 12/26/2007   Arthritis    OA   Blood transfusion without reported diagnosis    as a child   DIABETES MELLITUS, TYPE II 11/10/2006   ESOPHAGITIS, REFLUX 12/22/2007   GERD (gastroesophageal reflux disease)    HYPERCHOLESTEROLEMIA 12/22/2007   HYPERTENSION 11/10/2006   HYPOGONADISM 12/22/2007   Prob. due to ETOH   HYPOKALEMIA 06/06/2008   Leukopenia    Neuromuscular disorder (HCC)    neuropathy in feet from Diabetes   Neuropathy    Chronic Neuropathy BOTH FEET   Pancytopenia 06/06/2008   RENAL INSUFFICIENCY 11/10/2006   SEES [place] KID[facility] STAGE 2 CKD    Past Surgical History:  Procedure Laterality Date   APPENDECTOMY  AGE 61 OR 10   COLONOSCOPY  08-21-2004/2017   [facility]- normal/2017 [place]   COLONSCOPY  2017   ELECTROCARDIOGRAM  11/07/2006   ESOPHAGOGASTRODUODENOSCOPY  08/20/2004   EYE SURGERY Bilateral 2014   IOC FOR CATARACTS   I & D KNEE WITH POLY EXCHANGE Left 05/16/2019   Procedure: LEFT KNEE IRRIGATION AND  DEBRIDEMENT WITH POLY EXCHANGE;  Surgeon: Liam Lerner, MD;  Location: [facility];  Service: Orthopedics;  Laterality: Left;   REPLACEMENT TOTAL KNEE Right 2011   TOTAL KNEE ARTHROPLASTY Left 02/20/2018   Procedure: LEFT TOTAL KNEE ARTHROPLASTY;  Surgeon: Liam Lerner, MD;  Location: [facility];  Service: Orthopedics;  Laterality: Left;   UPPER GASTROINTESTINAL ENDOSCOPY      Family History  Problem Relation Age of Onset   Cancer Mother        Colon Cancer  Colon cancer Mother 22   Colon polyps Brother    Esophageal cancer Neg Hx    Rectal cancer Neg Hx    Stomach cancer Neg Hx     Social History   Tobacco Use   Smoking status: Never   Smokeless tobacco: Never  Substance Use Topics   Alcohol  use: No    Alcohol /week: 0.0 standard drinks of alcohol     Comment: LAST USE 10 YRS AGO    Subjective:   Presents for yearly CPE; is under the care of nephrology and endocrinology; did have labs done last month with his endocrinologist; would like to review immunizations and make sure up to date; no other acute concerns today;   Review of Systems  Constitutional: Negative.   HENT: Negative.    Eyes: Negative.   Respiratory: Negative.    Cardiovascular: Negative.   Gastrointestinal: Negative.   Genitourinary: Negative.   Musculoskeletal: Negative.   Skin: Negative.   Neurological: Negative.   Endo/Heme/Allergies: Negative.   Psychiatric/Behavioral: Negative.       Objective:  Vitals:   04/19/23 0958  BP: 130/82  Pulse: 74  SpO2: 97%  Weight: 286 lb 9.6 oz (130 kg)  Height: 5' 11 (1.803 m)    General: Well developed, well nourished, in no acute distress  Skin : Warm and dry.  Head: Normocephalic and atraumatic  Eyes: Sclera and conjunctiva clear; pupils round and reactive to light; extraocular movements intact  Ears: External normal; canals clear; tympanic membranes normal  Oropharynx: Pink, supple. No suspicious lesions  Neck: Supple without thyromegaly, adenopathy  Lungs:  Respirations unlabored; clear to auscultation bilaterally without wheeze, rales, rhonchi  CVS exam: normal rate and regular rhythm.  Abdomen: Soft; nontender; nondistended; normoactive bowel sounds; no masses or hepatosplenomegaly  Musculoskeletal: No deformities; no active joint inflammation  Extremities: No edema, cyanosis, clubbing  Vessels: Symmetric bilaterally  Neurologic: Alert and oriented; speech intact; face symmetrical; moves all extremities well; CNII-XII intact without focal deficit   Assessment:  1. PE (physical exam), annual   2. Prostate cancer screening     Plan:  Age appropriate preventive healthcare needs addressed; encouraged regular eye doctor and dental exams; encouraged regular exercise; continue regular follow up with endocrine and nephrology; will update labs and refills as needed today; follow-up in 1 year, sooner prn.    No follow-ups on file.  Orders Placed This Encounter  Procedures   PSA    Requested Prescriptions    No prescriptions requested or ordered in this encounter

## 2023-04-21 DIAGNOSIS — R531 Weakness: Secondary | ICD-10-CM | POA: Diagnosis not present

## 2023-04-21 DIAGNOSIS — M545 Low back pain, unspecified: Secondary | ICD-10-CM | POA: Diagnosis not present

## 2023-04-28 DIAGNOSIS — M545 Low back pain, unspecified: Secondary | ICD-10-CM | POA: Diagnosis not present

## 2023-04-28 DIAGNOSIS — R531 Weakness: Secondary | ICD-10-CM | POA: Diagnosis not present

## 2023-04-28 NOTE — Telephone Encounter (Signed)
Patient came in to office today and picked up 4 boxes of patient assistance Ozempic.

## 2023-05-05 DIAGNOSIS — R531 Weakness: Secondary | ICD-10-CM | POA: Diagnosis not present

## 2023-05-05 DIAGNOSIS — M545 Low back pain, unspecified: Secondary | ICD-10-CM | POA: Diagnosis not present

## 2023-05-10 DIAGNOSIS — E118 Type 2 diabetes mellitus with unspecified complications: Secondary | ICD-10-CM | POA: Diagnosis not present

## 2023-05-10 DIAGNOSIS — Z794 Long term (current) use of insulin: Secondary | ICD-10-CM | POA: Diagnosis not present

## 2023-05-11 ENCOUNTER — Other Ambulatory Visit: Payer: Self-pay | Admitting: Family

## 2023-05-12 ENCOUNTER — Other Ambulatory Visit: Payer: Self-pay | Admitting: Family

## 2023-05-12 DIAGNOSIS — M545 Low back pain, unspecified: Secondary | ICD-10-CM | POA: Diagnosis not present

## 2023-05-12 DIAGNOSIS — R531 Weakness: Secondary | ICD-10-CM | POA: Diagnosis not present

## 2023-05-12 DIAGNOSIS — E291 Testicular hypofunction: Secondary | ICD-10-CM

## 2023-05-26 DIAGNOSIS — H02831 Dermatochalasis of right upper eyelid: Secondary | ICD-10-CM | POA: Diagnosis not present

## 2023-05-26 DIAGNOSIS — H26493 Other secondary cataract, bilateral: Secondary | ICD-10-CM | POA: Diagnosis not present

## 2023-05-26 DIAGNOSIS — E119 Type 2 diabetes mellitus without complications: Secondary | ICD-10-CM | POA: Diagnosis not present

## 2023-05-26 DIAGNOSIS — H02834 Dermatochalasis of left upper eyelid: Secondary | ICD-10-CM | POA: Diagnosis not present

## 2023-05-26 LAB — HM DIABETES EYE EXAM

## 2023-05-30 ENCOUNTER — Encounter: Payer: Self-pay | Admitting: Endocrinology

## 2023-06-02 ENCOUNTER — Telehealth: Payer: Self-pay | Admitting: "Endocrinology

## 2023-06-02 NOTE — Telephone Encounter (Signed)
 Patient came in to office today and requested a form for "Chronic Condition Release of Information Form" be completed.  The form is in Dr. Grafton Folk folder in the front office.

## 2023-06-06 DIAGNOSIS — N1832 Chronic kidney disease, stage 3b: Secondary | ICD-10-CM | POA: Diagnosis not present

## 2023-06-09 DIAGNOSIS — D631 Anemia in chronic kidney disease: Secondary | ICD-10-CM | POA: Diagnosis not present

## 2023-06-09 DIAGNOSIS — N1832 Chronic kidney disease, stage 3b: Secondary | ICD-10-CM | POA: Diagnosis not present

## 2023-06-09 DIAGNOSIS — N189 Chronic kidney disease, unspecified: Secondary | ICD-10-CM | POA: Diagnosis not present

## 2023-06-09 DIAGNOSIS — I129 Hypertensive chronic kidney disease with stage 1 through stage 4 chronic kidney disease, or unspecified chronic kidney disease: Secondary | ICD-10-CM | POA: Diagnosis not present

## 2023-06-09 DIAGNOSIS — E872 Acidosis, unspecified: Secondary | ICD-10-CM | POA: Diagnosis not present

## 2023-06-09 DIAGNOSIS — E871 Hypo-osmolality and hyponatremia: Secondary | ICD-10-CM | POA: Diagnosis not present

## 2023-06-09 DIAGNOSIS — R609 Edema, unspecified: Secondary | ICD-10-CM | POA: Diagnosis not present

## 2023-06-10 ENCOUNTER — Other Ambulatory Visit: Payer: Self-pay | Admitting: Family

## 2023-06-27 ENCOUNTER — Ambulatory Visit: Admitting: "Endocrinology

## 2023-06-27 ENCOUNTER — Encounter: Payer: Self-pay | Admitting: "Endocrinology

## 2023-06-27 ENCOUNTER — Ambulatory Visit: Payer: Medicare Other | Admitting: "Endocrinology

## 2023-06-27 VITALS — BP 134/80 | HR 81 | Ht 71.0 in | Wt 288.0 lb

## 2023-06-27 DIAGNOSIS — Z794 Long term (current) use of insulin: Secondary | ICD-10-CM

## 2023-06-27 DIAGNOSIS — Z7984 Long term (current) use of oral hypoglycemic drugs: Secondary | ICD-10-CM | POA: Diagnosis not present

## 2023-06-27 DIAGNOSIS — E1140 Type 2 diabetes mellitus with diabetic neuropathy, unspecified: Secondary | ICD-10-CM

## 2023-06-27 DIAGNOSIS — E7800 Pure hypercholesterolemia, unspecified: Secondary | ICD-10-CM | POA: Diagnosis not present

## 2023-06-27 DIAGNOSIS — Z7985 Long-term (current) use of injectable non-insulin antidiabetic drugs: Secondary | ICD-10-CM | POA: Diagnosis not present

## 2023-06-27 LAB — POCT GLYCOSYLATED HEMOGLOBIN (HGB A1C): Hemoglobin A1C: 7.1 % — AB (ref 4.0–5.6)

## 2023-06-27 NOTE — Progress Notes (Signed)
 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 Will recommend for the following change of medications to: Farxiga 10 mg daily, ozempic 2 mg weekly (pt is on pt assistance for ozempic) Humalog 75/25 mix 16 units in a.m. 16 units before dinner, 15 min before eating (except 15 units bd the day pt takes ozempic to avoid lows as experienced by patient currently)  No known contraindications to any of above medications Glucagon ordered, 08/19/22  Hyperlipidemia -Last LDL near goal: 81 -on rosuvastatin 20 mg every day since 08/19/22 -Follow low fat diet and exercise   -Blood pressure goal <140/90 - Microalbumin/creatinine goal < 30 -On lisinopril 10 mg, sees nephrologist  -diet changes including salt restriction -limit eating outside -counseled BP targets per standards of diabetes care -Uncontrolled blood pressure can lead to retinopathy, nephropathy and cardiovascular and atherosclerotic heart disease  Reviewed and counseled on: -A1C target -Blood sugar targets -Complications of uncontrolled diabetes  -Checking blood sugar before meals and bedtime and bring log next visit -All medications with mechanism of action and side effects -Hypoglycemia management: rule of 15's, Glucagon Emergency Kit and medical alert ID -low-carb low-fat plate-method diet -At  least 20 minutes of physical activity per day -Annual dilated retinal eye exam and foot exam -compliance and follow up needs -follow up as scheduled or earlier if problem gets worse  Call if blood sugar is less than 70 or consistently above 250    Take a 15 gm snack of carbohydrate at bedtime before you go to sleep if your blood sugar is less than 100.    If you are going to fast after midnight for a test or procedure, ask your physician for instructions on how to reduce/decrease your insulin dose.    Call if blood sugar is less than 70 or consistently above 250  -Treating a low sugar by rule of 15  (15 gms of sugar every 15 min until sugar is more than 70) If you feel your sugar is low, test your sugar to be sure If your sugar is low (less than 70), then take 15 grams of a fast acting Carbohydrate (3-4 glucose tablets or glucose gel or 4 ounces of juice or regular soda) Recheck your sugar 15 min after treating low to make sure it is more than 70 If sugar is still less than 70, treat again with 15 grams of carbohydrate          Don't drive the hour of hypoglycemia  If unconscious/unable to eat or drink by mouth, use glucagon injection or nasal spray baqsimi and call 911. Can repeat again in 15 min if still unconscious.  Return in about 4 months (around 10/27/2023).   I have reviewed current medications, nurse's notes, allergies, vital signs, past medical and surgical history, family medical history, and social history for this encounter. Counseled patient on symptoms, examination findings, lab findings, imaging results, treatment decisions and monitoring  and prognosis. The patient understood the recommendations and agrees with the treatment plan. All questions regarding treatment plan were fully answered.  David Ellend[place], MD  06/27/23   History of Present Illness David Lindsey is a 68 y.o. year old male who presents for evaluation of Type 2 diabetes mellitus.  David Lindsey was first  diagnosed in 2013.   Diabetes education +  Home diabetes regimen: Non-insulin hypoglycemic drugs: Farxiga 10 mg daily, ozempic 2 mg weekly   Insulin regimen: Humalog 75/25 mix 16 units in a.m. 16 units before dinner, right before eating   No history of MEN syndrome/medullary thyroid cancer/pancreatitis or pancreatic cancer in self or family No UTI  Previous history:  Non-insulin hypoglycemic drugs previously used: Metformin, Invokana, Amaryl, Prandin, victoza Metformin stopped in 2019 Farxiga started in 2021 replacing Invokana Insulin was started in 5/20, generally has been on premixed insulin  COMPLICATIONS -  MI/Stroke -  retinopathy, last eye exam 2024 +  neuropathy +  nephropathy, GFR 33  BLOOD SUGAR DATA  CGM interpretation: At today's visit, we reviewed her CGM downloads. The full report is scanned in the media. Reviewing the CGM trends, BG are well controlled across the day, with few highs.  Physical Exam  BP 134/80   Pulse 81   Ht 5\' 11"  (1.803 m)   Wt 288 lb (130.6 kg)   SpO2 98%   BMI 40.17 kg/m    Constitutional: well developed, well nourished Head: normocephalic, atraumatic Eyes: sclera anicteric, no redness Neck: supple Lungs: normal respiratory effort Neurology: alert and oriented Skin: dry, no appreciable rashes Musculoskeletal: no appreciable defects Psychiatric: normal mood and affect Diabetic Foot Exam - Simple   No data filed      Current Medications Patient's Medications  New Prescriptions   No medications on file  Previous Medications   AMLODIPINE (NORVASC) 10 MG TABLET    TAKE 1 TABLET BY MOUTH EVERY DAY   APPLE CIDER VINEGAR 600 MG CAPS    Take 600 mg by mouth daily.   BIOTIN W/ VITAMINS C & E (HAIR SKIN & NAILS GUMMIES PO)    Take 1 tablet by mouth every evening.   CARBOXYMETHYLCELLUL-GLYCERIN (LUBRICATING EYE DROPS OP)    Place 1 drop into both eyes daily as needed (dry eyes).    CHOLECALCIFEROL (VITAMIN D3) 25 MCG (1000 UNIT) TABLET     Take 1,000 Units by mouth daily.   CLOTRIMAZOLE-BETAMETHASONE (LOTRISONE) CREAM    APPLY CREAM TOPICALLY THREE TIMES DAILY AS NEEDED FOR RASH   COENZYME Q10 (COQ-10) 200 MG CAPS    Take 200 mg by mouth every evening.    COMIRNATY SYRINGE       CONTINUOUS BLOOD GLUC SENSOR (FREESTYLE LIBRE 2 SENSOR) MISC    1 Device by Does not apply route every 14 (fourteen) days.   CONTINUOUS BLOOD GLUC SENSOR (FREESTYLE LIBRE 3 SENSOR) MISC    by Does not apply route.   CYANOCOBALAMIN (B-12) 5000 MCG CAPS    Take 5,000 mcg by mouth every evening.    DAPAGLIFLOZIN PROPANEDIOL (FARXIGA) 10 MG TABS TABLET    TAKE 1 TABLET BY MOUTH DAILY BEFORE BREAKFAST.   DICLOFENAC SODIUM (VOLTAREN) 1 % GEL    Apply topically 4 (four) times daily.   DOCUSATE SODIUM (COLACE) 100 MG CAPSULE    Take 200 mg by mouth every evening.    FLUZONE HIGH-DOSE 0.5 ML INJECTION       GABAPENTIN (NEURONTIN) 300 MG CAPSULE    TAKE 1 CAPSULE BY  MOUTH 3 TIMES  DAILY   GLUCAGON (GVOKE HYPOPEN 1-PACK) 1 MG/0.2ML SOAJ    INJECT 1 MG INTO THE SKIN AS NEEDED (LOW BLOOD SUAGR WITH IMPAIRED CONSCIOUSNESS).   GLUCOSE BLOOD (ONETOUCH ULTRA) TEST STRIP    Use as instructed Check blood sugars on waking up 3 days a week   GLUCOSE BLOOD TEST STRIP    Use as instructed twice a day at different times   INSULIN LISPRO PROTAMINE-LISPRO (HUMALOG 75/25 MIX) (75-25) 100 UNIT/ML SUSP INJECTION    Inject 16 Units into the skin 2 (two) times daily with a meal.   LISINOPRIL (ZESTRIL) 10 MG TABLET    TAKE 1 TABLET BY MOUTH EVERY DAY   MELOXICAM (MOBIC) 15 MG TABLET    Take 15 mg by mouth daily.   MENTHOL, TOPICAL ANALGESIC, 8 % LIQD    Apply 1 application topically 3 (three) times daily as needed (for knee pain.).    METHOCARBAMOL (ROBAXIN) 500 MG TABLET    Take 1 tablet (500 mg total) by mouth every 8 (eight) hours as needed for muscle spasms.   METHYLCELLULOSE, LAXATIVE, 500 MG TABS    Take 500 mg by mouth daily. Fiberwell Gummies   METHYLPREDNISOLONE (MEDROL) 4 MG  TABLET    Take 4 tablets by mouth on day 1, 3 tablets on day 2, 2 tablets on day 3 and 1 tablet on day 4.   METOPROLOL SUCCINATE (TOPROL-XL) 25 MG 24 HR TABLET    Take 0.5 tablets (12.5 mg total) by mouth at bedtime.   MM PEN NEEDLES 31G X 8 MM MISC    AS DIRECTED WITH  VICTOZA   MULTIPLE VITAMIN (MULTIVITAMIN WITH MINERALS) TABS TABLET    Take 1 tablet by mouth every evening.    OMEGA-3-6-9 CAPS    Take 1 capsule by mouth every evening. 1600mg    OMEPRAZOLE (PRILOSEC) 20 MG CAPSULE    TAKE 1 CAPSULE BY MOUTH EVERY DAY   ROSUVASTATIN (CRESTOR) 20 MG TABLET    TAKE 1 TABLET BY MOUTH EVERY DAY   SEMAGLUTIDE, 2 MG/DOSE, 8 MG/3ML SOPN    Inject 2 mg as directed once a week.   SILDENAFIL (VIAGRA) 100 MG TABLET    TAKE 1 TABLET BY MOUTH ONCE DAILY AS NEEDED   SODIUM BICARBONATE PO    Take 1 tablet by mouth 2 (two) times daily.   TRAZODONE (DESYREL) 100 MG TABLET    TAKE 1 TABLET BY MOUTH AT  BEDTIME   TROLAMINE SALICYLATE (ASPERCREME) 10 % CREAM    Apply 1 application topically as needed (knee pain).   VITAMIN E 400 UNIT CAPSULE    Take 400 Units by mouth every evening.   Modified Medications   No medications on file  Discontinued Medications   No medications on file    Allergies Allergies  Allergen Reactions   Pioglitazone Swelling    Past Medical History Past Medical History:  Diagnosis Date   ALCOHOLISM 12/22/2007   ANEMIA, IRON DEFICIENCY 12/26/2007   Arthritis    OA   Blood transfusion without reported diagnosis    as a child   DIABETES MELLITUS, TYPE II 11/10/2006   ESOPHAGITIS, REFLUX 12/22/2007   GERD (gastroesophageal reflux disease)    HYPERCHOLESTEROLEMIA 12/22/2007   HYPERTENSION 11/10/2006   HYPOGONADISM 12/22/2007   Prob. due to ETOH   HYPOKALEMIA 06/06/2008   Leukopenia    Neuromuscular disorder (HCC)    neuropathy in feet from Diabetes   Neuropathy    Chronic Neuropathy BOTH FEET  Pancytopenia 06/06/2008   RENAL INSUFFICIENCY 11/10/2006   SEES [place] [facility] STAGE 2  CKD    Past Surgical History Past Surgical History:  Procedure Laterality Date   APPENDECTOMY  AGE 40 OR 10   COLONOSCOPY  08-21-2004/2017   [facility] normal/2017 [place]   COLONSCOPY  2017   ELECTROCARDIOGRAM  11/07/2006   ESOPHAGOGASTRODUODENOSCOPY  08/20/2004   EYE SURGERY Bilateral 2014   IOC FOR CATARACTS   I & D KNEE WITH POLY EXCHANGE Left 05/16/2019   Procedure: LEFT KNEE IRRIGATION AND DEBRIDEMENT WITH POLY EXCHANGE;  Surgeon: Gean Birchwood, MD;  Location: [facility];  Service: Orthopedics;  Laterality: Left;   REPLACEMENT TOTAL KNEE Right 2011   TOTAL KNEE ARTHROPLASTY Left 02/20/2018   Procedure: LEFT TOTAL KNEE ARTHROPLASTY;  Surgeon: Gean Birchwood, MD;  Location: [facility];  Service: Orthopedics;  Laterality: Left;   UPPER GASTROINTESTINAL ENDOSCOPY      Family History family history includes Cancer in his mother; Colon cancer (age of onset: 28) in his mother; Colon polyps in his brother.  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  Tobacco Use   Smoking status: Never   Smokeless tobacco: Never  Vaping Use   Vaping status: Never Used  Substance and Sexual Activity   Alcohol use: No    Alcohol/week: 0.0 standard drinks of alcohol    Comment: LAST USE 10 YRS AGO   Drug use: No   Sexual activity: Not on file  Other Topics Concern   Not on file  Social History Narrative   Not on file   Social Drivers of Health   Financial Resource Strain: Low Risk  (08/14/2021)   Overall Financial Resource Strain (CARDIA)    Difficulty of Paying Living Expenses: Not hard at all  Food Insecurity: No Food Insecurity (08/17/2022)   Hunger Vital Sign    Worried About Running Out of Food in the Last Year: Never true    Ran Out of Food in the Last  Year: Never true  Transportation Needs: No Transportation Needs (08/17/2022)   PRAPARE - Administrator, Civil Service (Medical): No    Lack of Transportation (Non-Medical): No  Physical Activity: Not on file  Stress: No Stress Concern Present (08/14/2021)   [employer] of Occupational Health - Occupational Stress Questionnaire    Feeling of Stress : Not at all  Social Connections: Moderately Integrated (08/14/2021)   Social Connection and Isolation Panel [NHANES]    Frequency of Communication with Friends and Family: More than three times a week    Frequency of Social Gatherings with Friends and Family: More than three times a week    Attends Religious Services: More than 4 times per year    Active Member of [employer] or Organizations: No    Attends Banker Meetings: Never    Marital Status: Married  Catering manager Violence: Not At Risk (08/17/2022)   Humiliation, Afraid, Rape, and Kick questionnaire    Fear of Current or Ex-Partner: No    Emotionally Abused: No    Physically Abused: No    Sexually Abused: No    Lab Results  Component Value Date   HGBA1C 7.1 (A) 06/27/2023  Lab Results  Component Value Date   CHOL 136 03/22/2023   Lab Results  Component Value Date   HDL 51 03/22/2023   Lab Results  Component Value Date   LDLCALC 71 03/22/2023   Lab Results  Component Value Date   TRIG 49 03/22/2023   Lab Results  Component Value Date   CHOLHDL 2.7 03/22/2023   Lab Results  Component Value Date   CREATININE 1.80 (H) 03/22/2023   Lab Results  Component Value Date   GFR 33.04 (L) 08/18/2022   Lab Results  Component Value Date   MICROALBUR 2.8 (H) 08/18/2022      Component Value Date/Time   NA 137 03/22/2023 0810   K 4.3 03/22/2023 0810   CL 104 03/22/2023 0810   CO2 25 03/22/2023 0810   GLUCOSE 156 (H) 03/22/2023 0810   BUN 19 03/22/2023 0810   CREATININE 1.80 (H) 03/22/2023 0810   CALCIUM 9.2 03/22/2023 0810   PROT 7.1  03/22/2023 0810   ALBUMIN 4.2 08/18/2022 1001   AST 20 03/22/2023 0810   ALT 32 03/22/2023 0810   ALKPHOS 56 08/18/2022 1001   BILITOT 0.4 03/22/2023 0810   GFRNONAA 38 (L) 05/17/2019 0421   GFRAA 44 (L) 05/17/2019 0421      Latest Ref Rng & Units 03/22/2023    8:10 AM 08/18/2022   10:01 AM 05/14/2022    8:47 AM  BMP  Glucose 65 - 99 mg/dL 161  096  045   BUN 7 - 25 mg/dL 19  19    Creatinine 4.09 - 1.35 mg/dL 8.11  9.14    BUN/Creat Ratio 6 - 22 (calc) 11     Sodium 135 - 146 mmol/L 137  137    Potassium 3.5 - 5.3 mmol/L 4.3  4.1    Chloride 98 - 110 mmol/L 104  102    CO2 20 - 32 mmol/L 25  25    Calcium 8.6 - 10.3 mg/dL 9.2  9.5         Component Value Date/Time   WBC 3.9 (L) 08/18/2022 1001   RBC 5.29 08/18/2022 1001   HGB 15.3 08/18/2022 1001   HCT 45.8 08/18/2022 1001   PLT 170.0 08/18/2022 1001   MCV 86.7 08/18/2022 1001   MCH 28.0 10/18/2019 1600   MCHC 33.3 08/18/2022 1001   RDW 13.7 08/18/2022 1001   LYMPHSABS 1.4 04/13/2022 1052   MONOABS 0.6 04/13/2022 1052   EOSABS 0.4 04/13/2022 1052   BASOSABS 0.0 04/13/2022 1052     Parts of this note may have been dictated using voice recognition software. There may be variances in spelling and vocabulary which are unintentional. Not all errors are proofread. Please notify the Thereasa Parkin if any discrepancies are noted or if the meaning of any statement is not clear.

## 2023-06-27 NOTE — Patient Instructions (Signed)

## 2023-07-01 ENCOUNTER — Encounter: Payer: Self-pay | Admitting: "Endocrinology

## 2023-07-03 ENCOUNTER — Other Ambulatory Visit: Payer: Self-pay | Admitting: Family

## 2023-07-19 ENCOUNTER — Other Ambulatory Visit: Payer: Self-pay | Admitting: "Endocrinology

## 2023-07-20 NOTE — Telephone Encounter (Signed)
 Requested Prescriptions   Pending Prescriptions Disp Refills   HUMALOG MIX 75/25 KWIKPEN (75-25) 100 UNIT/ML KwikPen [Pharmacy Med Name: HumaLOG Mix 75/25 KwikPen Subcutaneous Suspension Pen-injector (75-25) 100 UNIT/ML] 45 mL 0    Sig: DIAL AND INJECT 16 UNITS UNDER THE SKIN DAILY WITH BREAKFAST AND DINNER. MAX DAILY DOSE 36 UNITS.

## 2023-07-27 ENCOUNTER — Other Ambulatory Visit: Payer: Self-pay | Admitting: Family

## 2023-07-27 ENCOUNTER — Other Ambulatory Visit: Payer: Self-pay | Admitting: "Endocrinology

## 2023-07-27 NOTE — Telephone Encounter (Signed)
 Requested Prescriptions     Pending Prescriptions Disp Refills    rosuvastatin (CRESTOR) 20 MG tablet [Pharmacy Med Name: ROSUVASTATIN CALCIUM 20 MG TAB] 90 tablet 1     Sig: TAKE 1 TABLET BY MOUTH EVERY DAY

## 2023-08-08 DIAGNOSIS — E118 Type 2 diabetes mellitus with unspecified complications: Secondary | ICD-10-CM | POA: Diagnosis not present

## 2023-08-08 DIAGNOSIS — E1140 Type 2 diabetes mellitus with diabetic neuropathy, unspecified: Secondary | ICD-10-CM | POA: Diagnosis not present

## 2023-08-10 ENCOUNTER — Other Ambulatory Visit: Payer: Self-pay | Admitting: Family

## 2023-08-10 DIAGNOSIS — E291 Testicular hypofunction: Secondary | ICD-10-CM

## 2023-08-12 ENCOUNTER — Telehealth: Payer: Self-pay

## 2023-08-12 NOTE — Telephone Encounter (Signed)
 Left vm for pt regarding pt assistance had arrive in office.

## 2023-08-12 NOTE — Telephone Encounter (Signed)
 Pt picked up patient assistance medication Ozempic .

## 2023-08-24 ENCOUNTER — Ambulatory Visit: Admitting: "Endocrinology

## 2023-08-25 ENCOUNTER — Ambulatory Visit: Admitting: *Deleted

## 2023-08-25 ENCOUNTER — Other Ambulatory Visit: Payer: Self-pay | Admitting: Family

## 2023-08-25 VITALS — BP 122/70 | Temp 98.2°F | Resp 16 | Ht 69.5 in | Wt 291.8 lb

## 2023-08-25 DIAGNOSIS — E1122 Type 2 diabetes mellitus with diabetic chronic kidney disease: Secondary | ICD-10-CM

## 2023-08-25 DIAGNOSIS — Z0000 Encounter for general adult medical examination without abnormal findings: Secondary | ICD-10-CM

## 2023-08-25 MED ORDER — FUROSEMIDE 20 MG PO TABS: 20.0000 mg | ORAL_TABLET | Freq: Every day | ORAL | 1 refills | Status: DC | PRN

## 2023-08-25 NOTE — Progress Notes (Addendum)
 Please attest this visit in the absence of patient primary care provider.    Subjective:   David Lindsey is a 68 y.o. who presents for a Medicare Wellness preventive visit.  As a reminder, Annual Wellness Visits don't include a physical exam, and some assessments may be limited, especially if this visit is performed virtually. We may recommend an in-person visit if needed.  Visit Complete: In person  TIMED UP AND GO:  Was the test performed?  Yes  Length of time to ambulate 10 feet: 5 sec Gait steady and fast with assistive device  VideoDeclined- This patient declined Interactive audio and video telecommunications. Therefore the visit was completed with audio only.  Persons Participating in Visit: Patient.  AWV Questionnaire: No: Patient Medicare AWV questionnaire was not completed prior to this visit.  Cardiac Risk Factors include: obesity (BMI >30kg/m2);advanced age (>69men, >38 women);diabetes mellitus;hypertension;dyslipidemia;male gender     Objective:     Today's Vitals   08/25/23 1024  Resp: 16  Temp: 98.2 F (36.8 C)  TempSrc: Oral  SpO2: 98%  Weight: 291 lb 12.8 oz (132.4 kg)  Height: 5' 9.5" (1.765 m)   Body mass index is 42.47 kg/m.     08/25/2023   10:57 AM 08/17/2022   10:21 AM 08/14/2021   10:58 AM 04/02/2021   11:30 AM 05/16/2019   10:58 AM 05/15/2019    1:33 PM 05/07/2019    4:59 PM  Advanced Directives  Does Patient Have a Medical Advance Directive? No No No No No No No  Would patient like information on creating a medical advance directive? No - Patient declined No - Patient declined No - Patient declined Yes (MAU/Ambulatory/Procedural Areas - Information given) No - Patient declined No - Patient declined Yes (ED - Information included in AVS)    Current Medications (verified) Outpatient Encounter Medications as of 08/25/2023  Medication Sig   amLODipine  (NORVASC ) 10 MG tablet TAKE 1 TABLET BY MOUTH EVERY DAY   Apple Cider Vinegar 600 MG CAPS  Take 600 mg by mouth daily.   Biotin w/ Vitamins C & E (HAIR SKIN & NAILS GUMMIES PO) Take 1 tablet by mouth every evening.   Carboxymethylcellul-Glycerin  (LUBRICATING EYE DROPS OP) Place 1 drop into both eyes daily as needed (dry eyes).    cholecalciferol (VITAMIN D3) 25 MCG (1000 UNIT) tablet Take 1,000 Units by mouth daily.   clotrimazole -betamethasone  (LOTRISONE ) cream APPLY CREAM TOPICALLY THREE TIMES DAILY AS NEEDED FOR RASH   Coenzyme Q10 (COQ-10) 200 MG CAPS Take 200 mg by mouth every evening.    Continuous Blood Gluc Sensor (FREESTYLE LIBRE 3 SENSOR) MISC by Does not apply route.   Cyanocobalamin (B-12) 5000 MCG CAPS Take 5,000 mcg by mouth every evening.    dapagliflozin  propanediol (FARXIGA ) 10 MG TABS tablet TAKE 1 TABLET BY MOUTH DAILY BEFORE BREAKFAST.   diclofenac Sodium (VOLTAREN) 1 % GEL Apply topically 4 (four) times daily.   docusate sodium  (COLACE) 100 MG capsule Take 200 mg by mouth every evening.    furosemide  (LASIX ) 20 MG tablet Take 20 mg by mouth daily. Pt takes every other day as needed   gabapentin  (NEURONTIN ) 300 MG capsule TAKE 1 CAPSULE BY MOUTH 3 TIMES  DAILY   Glucagon  (GVOKE HYPOPEN  1-PACK) 1 MG/0.2ML SOAJ INJECT 1 MG INTO THE SKIN AS NEEDED (LOW BLOOD SUAGR WITH IMPAIRED CONSCIOUSNESS).   glucose blood (ONETOUCH ULTRA) test strip Use as instructed Check blood sugars on waking up 3 days a week  glucose blood test strip Use as instructed twice a day at different times   HUMALOG  MIX 75/25 KWIKPEN (75-25) 100 UNIT/ML KwikPen DIAL AND INJECT 16 UNITS UNDER THE SKIN DAILY WITH BREAKFAST AND DINNER. MAX DAILY DOSE 36 UNITS.   lisinopril  (ZESTRIL ) 10 MG tablet TAKE 1 TABLET BY MOUTH EVERY DAY   Methylcellulose, Laxative, 500 MG TABS Take 500 mg by mouth daily. Fiberwell Gummies   metoprolol  succinate (TOPROL -XL) 25 MG 24 hr tablet Take 0.5 tablets (12.5 mg total) by mouth at bedtime.   MM PEN NEEDLES 31G X 8 MM MISC AS DIRECTED WITH  VICTOZA    Multiple Vitamin  (MULTIVITAMIN WITH MINERALS) TABS tablet Take 1 tablet by mouth every evening.    Omega-3-6-9 CAPS Take 1 capsule by mouth every evening. 1600mg    omeprazole  (PRILOSEC) 20 MG capsule TAKE 1 CAPSULE BY MOUTH EVERY DAY   rosuvastatin  (CRESTOR ) 20 MG tablet TAKE 1 TABLET BY MOUTH EVERY DAY   Semaglutide , 2 MG/DOSE, 8 MG/3ML SOPN Inject 2 mg as directed once a week.   sildenafil  (VIAGRA ) 100 MG tablet TAKE 1 TABLET BY MOUTH ONCE DAILY AS NEEDED   traZODone  (DESYREL ) 100 MG tablet TAKE 1 TABLET BY MOUTH AT  BEDTIME   trolamine salicylate (ASPERCREME) 10 % cream Apply 1 application topically as needed (knee pain).   vitamin E 400 UNIT capsule Take 400 Units by mouth every evening.    FLUZONE HIGH-DOSE 0.5 ML injection    Menthol , Topical Analgesic, 8 % LIQD Apply 1 application topically 3 (three) times daily as needed (for knee pain.).    [DISCONTINUED] COMIRNATY syringe    [DISCONTINUED] Continuous Blood Gluc Sensor (FREESTYLE LIBRE 2 SENSOR) MISC 1 Device by Does not apply route every 14 (fourteen) days. (Patient not taking: Reported on 08/25/2023)   [DISCONTINUED] insulin  lispro protamine-lispro (HUMALOG  75/25 MIX) (75-25) 100 UNIT/ML SUSP injection Inject 16 Units into the skin 2 (two) times daily with a meal. (Patient not taking: Reported on 08/25/2023)   [DISCONTINUED] meloxicam (MOBIC) 15 MG tablet Take 15 mg by mouth daily. (Patient not taking: Reported on 08/25/2023)   [DISCONTINUED] methocarbamol  (ROBAXIN ) 500 MG tablet Take 1 tablet (500 mg total) by mouth every 8 (eight) hours as needed for muscle spasms.   [DISCONTINUED] methylPREDNISolone  (MEDROL ) 4 MG tablet Take 4 tablets by mouth on day 1, 3 tablets on day 2, 2 tablets on day 3 and 1 tablet on day 4.   [DISCONTINUED] SODIUM BICARBONATE PO Take 1 tablet by mouth 2 (two) times daily.   No facility-administered encounter medications on file as of 08/25/2023.    Allergies (verified) Pioglitazone   History: Past Medical History:   Diagnosis Date   ALCOHOLISM 12/22/2007   ANEMIA, IRON DEFICIENCY 12/26/2007   Arthritis    OA   Blood transfusion without reported diagnosis    as a child   DIABETES MELLITUS, TYPE II 11/10/2006   ESOPHAGITIS, REFLUX 12/22/2007   GERD (gastroesophageal reflux disease)    HYPERCHOLESTEROLEMIA 12/22/2007   HYPERTENSION 11/10/2006   HYPOGONADISM 12/22/2007   Prob. 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   Past Surgical History:  Procedure Laterality Date   APPENDECTOMY  AGE 27 OR 10   COLONOSCOPY  08-21-2004/2017   [facility]- normal/2017 Elvin Hammer  COLONSCOPY  2017   ELECTROCARDIOGRAM  11/07/2006   ESOPHAGOGASTRODUODENOSCOPY  08/20/2004   EYE SURGERY Bilateral 2014   IOC FOR CATARACTS   I & D KNEE WITH POLY EXCHANGE Left 05/16/2019   Procedure: LEFT KNEE IRRIGATION AND DEBRIDEMENT WITH POLY EXCHANGE;  Surgeon: Wendolyn Hamburger, MD;  Location: [facility];  Service: Orthopedics;  Laterality: Left;   REPLACEMENT TOTAL KNEE Right 2011   TOTAL KNEE ARTHROPLASTY Left 02/20/2018   Procedure: LEFT TOTAL KNEE ARTHROPLASTY;  Surgeon: Wendolyn Hamburger, MD;  Location: [facility];  Service: Orthopedics;  Laterality: Left;   UPPER GASTROINTESTINAL ENDOSCOPY     Family History  Problem Relation Age of Onset   Cancer Mother        Colon Cancer   Colon cancer Mother 25   Colon polyps Brother    Esophageal cancer Neg Hx    Rectal cancer Neg Hx    Stomach cancer Neg Hx    Social History   Socioeconomic History   Marital status: Married    Spouse name: Not on file   Number of children: Not on file   Years of education: Not on file   Highest education level: Not on file  Occupational History   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  Tobacco Use   Smoking status: Never   Smokeless  tobacco: Never  Vaping Use   Vaping status: Never Used  Substance and Sexual Activity   Alcohol  use: No    Alcohol /week: 0.0 standard drinks of alcohol     Comment: LAST USE 10 YRS AGO   Drug use: No   Sexual activity: Not on file  Other Topics Concern   Not on file  Social History Narrative   Not on file   Social Drivers of Health   Financial Resource Strain: High Risk (08/25/2023)   Overall Financial Resource Strain (CARDIA)    Difficulty of Paying Living Expenses: Hard  Food Insecurity: No Food Insecurity (08/25/2023)   Hunger Vital Sign    Worried About Running Out of Food in the Last Year: Never true    Ran Out of Food in the Last Year: Never true  Transportation Needs: No Transportation Needs (08/25/2023)   PRAPARE - Administrator, Civil Service (Medical): No    Lack of Transportation (Non-Medical): No  Physical Activity: Inactive (08/25/2023)   Exercise Vital Sign    Days of Exercise per Week: 0 days    Minutes of Exercise per Session: 0 min  Stress: No Stress Concern Present (08/25/2023)   [employer] of Occupational Health - Occupational Stress Questionnaire    Feeling of Stress : Not at all  Social Connections: Moderately Integrated (08/25/2023)   Social Connection and Isolation Panel [NHANES]    Frequency of Communication with Friends and Family: More than three times a week    Frequency of Social Gatherings with Friends and Family: Once a week    Attends Religious Services: More than 4 times per year    Active Member of [employer] or Organizations: No    Attends Engineer, structural: Never    Marital Status: Married    Tobacco Counseling Counseling given: Not Answered    Clinical Intake:  Pre-visit preparation completed: Yes  Pain : No/denies pain     BMI - recorded: 42.47 Nutritional Status: BMI > 30  Obese Nutritional Risks: None Diabetes: Yes CBG done?: No Did pt. bring in CBG monitor from home?: No  Lab Results  Component  Value  Date   HGBA1C 7.1 (A) 06/27/2023   HGBA1C 7.4 (H) 03/22/2023   HGBA1C 6.8 (A) 11/24/2022     How often do you need to have someone help you when you read instructions, pamphlets, or other written materials from your doctor or pharmacy?: 1 - Never What is the last grade level you completed in school?: 12  Interpreter Needed?: No  Information entered by :: Susa Engman, CMA   Activities of Daily Living     08/25/2023   10:40 AM  In your present state of health, do you have any difficulty performing the following activities:  Hearing? 0  Vision? 0  Comment sees [facility]. last exam 03/2023  Difficulty concentrating or making decisions? 0  Walking or climbing stairs? 0  Dressing or bathing? 0  Doing errands, shopping? 0  Preparing Food and eating ? N  Using the Toilet? N  In the past six months, have you accidently leaked urine? N  Do you have problems with loss of bowel control? N  Managing your Medications? N  Managing your Finances? N  Housekeeping or managing your Housekeeping? N    Patient Care Team: Adra Alanis, FNP as PCP - General (Internal Medicine)  Indicate any recent Medical Services you may have received from other than [facility] providers in the past year (date may be approximate).     Assessment:    This is a routine wellness examination for [place].  Hearing/Vision screen Hearing Screening - Comments:: Denies difficulty Vision Screening - Comments:: Last exam 03/2023 with [facility]   Goals Addressed   None    Depression Screen     08/25/2023   10:50 AM 08/17/2022   10:23 AM 04/13/2022   10:12 AM 01/22/2022   10:01 AM 08/14/2021   11:02 AM 04/02/2021   10:56 AM 03/10/2021    3:08 PM  PHQ 2/9 Scores  PHQ - 2 Score 0 0 0 0 0 0 0  PHQ- 9 Score 0          Fall Risk     08/25/2023   10:53 AM 08/17/2022   10:22 AM 04/13/2022   10:12 AM 08/14/2021   11:01 AM 04/02/2021   10:55 AM  Fall Risk   Falls in the past year? 0 0 0 0 0   Number falls in past yr: 0 0 0 0 0  Injury with Fall? 0 0 0 0 0  Risk for fall due to : No Fall Risks No Fall Risks No Fall Risks No Fall Risks No Fall Risks  Follow up Falls evaluation completed Falls evaluation completed Falls evaluation completed Falls evaluation completed Falls evaluation completed    MEDICARE RISK AT HOME:  Medicare Risk at Home Any stairs in or around the home?: Yes If so, are there any without handrails?: Yes Home free of loose throw rugs in walkways, pet beds, electrical cords, etc?: No Adequate lighting in your home to reduce risk of falls?: Yes Life alert?: No Use of a cane, walker or w/c?: No Grab bars in the bathroom?: No Shower chair or bench in shower?: No Elevated toilet seat or a handicapped toilet?: Yes    Cognitive Function: 6CIT completed        08/25/2023   10:55 AM 08/17/2022   10:26 AM 08/14/2021   11:26 AM 08/14/2021   11:16 AM  6CIT Screen  What Year? 0 points 0 points 0 points 0 points  What month? 0 points 0 points 0 points  0 points  What time? 0 points 0 points 0 points 0 points  Count back from 20 0 points 0 points 0 points 0 points  Months in reverse 0 points 0 points 2 points 2 points  Repeat phrase 0 points 0 points 0 points 0 points  Total Score 0 points 0 points 2 points 2 points    Immunizations Immunization History  Administered Date(s) Administered   Fluad Quad(high Dose 65+) 01/06/2021   Influenza Split 01/05/2012   Influenza Whole 01/06/2009, 12/10/2010   Influenza,inj,Quad PF,6+ Mos 01/18/2013, 01/28/2014, 12/13/2014, 02/02/2016, 12/29/2017, 12/06/2018, 12/07/2019   Influenza-Unspecified 01/13/2017, 01/18/2022   PFIZER(Purple Top)SARS-COV-2 Vaccination 04/25/2019, 05/16/2019, 07/17/2020, 01/13/2021   PNEUMOCOCCAL CONJUGATE-20 04/13/2022   Pfizer Covid-19 Vaccine Bivalent Booster 80yrs & up 01/06/2021   Pneumococcal Conjugate-13 02/02/2016   Pneumococcal Polysaccharide-23 04/17/2007, 02/24/2017   Respiratory  Syncytial Virus Vaccine,Recomb Aduvanted(Arexvy) 01/18/2022   Td 09/18/2004   Tdap 02/02/2016   Unspecified SARS-COV-2 Vaccination 01/18/2022   Zoster Recombinant(Shingrix ) 05/02/2018, 10/04/2018   Zoster, Live 01/28/2014    Screening Tests Health Maintenance  Topic Date Due   COVID-19 Vaccine (6 - 2024-25 season) 12/12/2022   Medicare Annual Wellness (AWV)  08/17/2023   Diabetic kidney evaluation - Urine ACR  08/18/2023   FOOT EXAM  08/19/2023   INFLUENZA VACCINE  11/11/2023   HEMOGLOBIN A1C  12/28/2023   Diabetic kidney evaluation - eGFR measurement  03/21/2024   OPHTHALMOLOGY EXAM  05/25/2024   Colonoscopy  06/25/2025   DTaP/Tdap/Td (3 - Td or Tdap) 02/01/2026   Pneumonia Vaccine 38+ Years old  Completed   Hepatitis C Screening  Completed   Zoster Vaccines- Shingrix   Completed   HPV VACCINES  Aged Out   Meningococcal B Vaccine  Aged Out    Health Maintenance  Health Maintenance Due  Topic Date Due   COVID-19 Vaccine (6 - 2024-25 season) 12/12/2022   Medicare Annual Wellness (AWV)  08/17/2023   Diabetic kidney evaluation - Urine ACR  08/18/2023   FOOT EXAM  08/19/2023   Health Maintenance Items Addressed: He will get COVID vaccine at his pharmacy. Will get foot exam with endo at visit in July. Urine Microalbumin ordered today.  Additional Screening:  Vision Screening: Recommended annual ophthalmology exams for early detection of glaucoma and other disorders of the eye.  Dental Screening: Recommended annual dental exams for proper oral hygiene  Community Resource Referral / Chronic Care Management: CRR required this visit?  No   CCM required this visit?  No   Plan:    I have personally reviewed and noted the following in the patient's chart:   Medical and social history Use of alcohol , tobacco or illicit drugs  Current medications and supplements including opioid prescriptions. Patient is not currently taking opioid prescriptions. Functional ability and  status Nutritional status Physical activity Advanced directives List of other physicians Hospitalizations, surgeries, and ER visits in previous 12 months Vitals Screenings to include cognitive, depression, and falls Referrals and appointments  In addition, I have reviewed and discussed with patient certain preventive protocols, quality metrics, and best practice recommendations. A written personalized care plan for preventive services as well as general preventive health recommendations were provided to patient.   Susa Engman, CMA   08/25/2023   After Visit Summary: (In Person-Printed) AVS printed and given to the patient  Notes: Please refer to Routing Comments.

## 2023-08-25 NOTE — Patient Instructions (Addendum)
 Mr. Thousand , Thank you for taking time out of your busy schedule to complete your Annual Wellness Visit with me. I enjoyed our conversation and look forward to speaking with you again next year. I, as well as your care team,  appreciate your ongoing commitment to your health goals. Please review the following plan we discussed and let me know if I can assist you in the future. Your Game plan/ To Do List    Vaccines:  COVID please get at your local pharmacy when you decide to get your next one. Foot Exam:  Remember to get your diabetic foot exam at your next visit with Dr Vertell Gory  Follow up Visits: Next Medicare AWV with our clinical staff: 08/28/24  at 11am Have you seen your provider in the last 6 months (3 months if uncontrolled diabetes)? No Next Office Visit with your provider: 04/26/24 at 10am  Clinician Recommendations:  Aim for 30 minutes of exercise or brisk walking, 6-8 glasses of water , and 5 servings of fruits and vegetables each day.       This is a list of the screening recommended for you and due dates:  Health Maintenance  Topic Date Due   COVID-19 Vaccine (6 - 2024-25 season) 12/12/2022   Medicare Annual Wellness Visit  08/17/2023   Yearly kidney health urinalysis for diabetes  08/18/2023   Complete foot exam   08/19/2023   Flu Shot  11/11/2023   Hemoglobin A1C  12/28/2023   Yearly kidney function blood test for diabetes  03/21/2024   Eye exam for diabetics  05/25/2024   Colon Cancer Screening  06/25/2025   DTaP/Tdap/Td vaccine (3 - Td or Tdap) 02/01/2026   Pneumonia Vaccine  Completed   Hepatitis C Screening  Completed   Zoster (Shingles) Vaccine  Completed   HPV Vaccine  Aged Out   Meningitis B Vaccine  Aged Out    Advanced directives: (Copy Requested) Please bring a copy of your health care power of attorney and living will to the office to be added to your chart at your convenience. You can mail to [facility] [address] [place], [place]  [address] or email to [email or web address]   Advance Care Planning is important because it:  [x]  Makes sure you receive the medical care that is consistent with your values, goals, and preferences  [x]  It provides guidance to your family and loved ones and reduces their decisional burden about whether or not they are making the right decisions based on your wishes.  Follow the link provided in your after visit summary or read over the paperwork we have mailed to you to help you started getting your Advance Directives in place. If you need assistance in completing these, please reach out to us  so that we can help you!  See attachments for Preventive Care and Fall Prevention Tips.

## 2023-09-06 ENCOUNTER — Telehealth: Payer: Self-pay | Admitting: Family

## 2023-09-09 ENCOUNTER — Other Ambulatory Visit: Payer: Self-pay | Admitting: Family

## 2023-09-09 MED ORDER — CLOTRIMAZOLE-BETAMETHASONE 1-0.05 % EX CREA: TOPICAL_CREAM | CUTANEOUS | 0 refills | Status: DC

## 2023-09-09 NOTE — Telephone Encounter (Signed)
 Copied from CRM [id number]. Topic: Clinical - Prescription Issue >> Sep 09, 2023 10:16 AM [place] wrote: Reason for CRM: clotrimazole -betamethasone  (LOTRISONE ) cream- pharmacy request refill but have not received, it was sent three days ago

## 2023-10-09 ENCOUNTER — Other Ambulatory Visit: Payer: Self-pay | Admitting: Family

## 2023-10-09 ENCOUNTER — Other Ambulatory Visit: Payer: Self-pay | Admitting: "Endocrinology

## 2023-10-09 DIAGNOSIS — E1122 Type 2 diabetes mellitus with diabetic chronic kidney disease: Secondary | ICD-10-CM

## 2023-10-10 NOTE — Telephone Encounter (Signed)
 Requested Prescriptions   Pending Prescriptions Disp Refills   FARXIGA  10 MG TABS tablet [Pharmacy Med Name: FARXIGA  10 MG TABLET] 30 tablet 5    Sig: TAKE 1 TABLET BY MOUTH DAILY BEFORE BREAKFAST.

## 2023-10-17 ENCOUNTER — Other Ambulatory Visit: Payer: Self-pay | Admitting: "Endocrinology

## 2023-10-31 ENCOUNTER — Encounter: Payer: Self-pay | Admitting: "Endocrinology

## 2023-11-02 ENCOUNTER — Ambulatory Visit: Admitting: "Endocrinology

## 2023-11-02 DIAGNOSIS — Z794 Long term (current) use of insulin: Secondary | ICD-10-CM | POA: Diagnosis not present

## 2023-11-02 DIAGNOSIS — E118 Type 2 diabetes mellitus with unspecified complications: Secondary | ICD-10-CM | POA: Diagnosis not present

## 2023-11-05 ENCOUNTER — Other Ambulatory Visit: Payer: Self-pay | Admitting: Family

## 2023-11-06 DIAGNOSIS — Z794 Long term (current) use of insulin: Secondary | ICD-10-CM | POA: Diagnosis not present

## 2023-11-06 DIAGNOSIS — E118 Type 2 diabetes mellitus with unspecified complications: Secondary | ICD-10-CM | POA: Diagnosis not present

## 2023-11-08 ENCOUNTER — Other Ambulatory Visit: Payer: Self-pay | Admitting: Family

## 2023-11-08 ENCOUNTER — Other Ambulatory Visit: Payer: Self-pay | Admitting: "Endocrinology

## 2023-11-08 NOTE — Telephone Encounter (Signed)
 Requested Prescriptions     Pending Prescriptions Disp Refills    rosuvastatin (CRESTOR) 20 MG tablet [Pharmacy Med Name: ROSUVASTATIN CALCIUM 20 MG TAB] 90 tablet 1     Sig: TAKE 1 TABLET BY MOUTH EVERY DAY

## 2023-11-15 ENCOUNTER — Telehealth: Payer: Self-pay

## 2023-11-15 NOTE — Telephone Encounter (Signed)
 Called pt to notify him that his ozmepic  from pt assistance was in office for him to pick up.

## 2023-11-18 NOTE — Telephone Encounter (Signed)
 Patient picked up patient assistance - Log Noted Ozempic  4 boxes

## 2023-11-25 ENCOUNTER — Ambulatory Visit: Admitting: "Endocrinology

## 2023-11-25 ENCOUNTER — Encounter: Payer: Self-pay | Admitting: "Endocrinology

## 2023-11-25 VITALS — BP 140/70 | HR 84 | Ht 69.0 in | Wt 284.0 lb

## 2023-11-25 DIAGNOSIS — Z794 Long term (current) use of insulin: Secondary | ICD-10-CM

## 2023-11-25 DIAGNOSIS — Z7985 Long-term (current) use of injectable non-insulin antidiabetic drugs: Secondary | ICD-10-CM | POA: Diagnosis not present

## 2023-11-25 DIAGNOSIS — Z7984 Long term (current) use of oral hypoglycemic drugs: Secondary | ICD-10-CM

## 2023-11-25 DIAGNOSIS — E7800 Pure hypercholesterolemia, unspecified: Secondary | ICD-10-CM

## 2023-11-25 DIAGNOSIS — E1140 Type 2 diabetes mellitus with diabetic neuropathy, unspecified: Secondary | ICD-10-CM | POA: Diagnosis not present

## 2023-11-25 LAB — POCT GLYCOSYLATED HEMOGLOBIN (HGB A1C): Hemoglobin A1C: 7.2 % — AB (ref 4.0–5.6)

## 2023-11-25 NOTE — Progress Notes (Signed)
 Outpatient Endocrinology Note David Birmingham, MD  11/25/23   David Lindsey 11-Oct-1955 [id number]  Referring Provider: Jason Leita Repine,* Primary Care Provider: Jason Leita Repine, FNP Reason for consultation: Subjective   Assessment & Plan  Diagnoses and all orders for this visit:  Type 2 diabetes mellitus with diabetic neuropathy, with long-term current use of insulin  (HCC) -     POCT glycosylated hemoglobin (Hb A1C) -     Lipid panel -     Microalbumin / creatinine urine ratio -     Comprehensive metabolic panel with GFR -     Hemoglobin A1c -     Ambulatory referral to Podiatry  Long term (current) use of oral hypoglycemic drugs  Long-term (current) use of injectable non-insulin  antidiabetic drugs  Pure hypercholesterolemia   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 Will recommend for the following change of medications to: Farxiga  10 mg daily, ozempic  2 mg weekly (pt is on pt assistance for ozempic ) Humalog  75/25 mix 16 units in a.m. 16 units before dinner, 15 min before eating (except 15 units bd the day pt takes ozempic  to avoid lows as experienced by patient currently) 11/25/23: ordered podiatry referral for flat foot, left foot base callus, rule out onychomysis, well controlled diabetic  No known contraindications to any of above medications Glucagon  ordered, 08/19/22  Hyperlipidemia -Last LDL near goal: 81 -on rosuvastatin  20 mg every day since 08/19/22 -Follow low fat diet and exercise   -Blood pressure goal <140/90 - Microalbumin/creatinine goal < 30 -On lisinopril  10 mg, sees nephrologist  -diet changes including salt restriction -limit eating outside -counseled BP targets per standards of diabetes care -Uncontrolled blood pressure can lead to retinopathy, nephropathy and cardiovascular and atherosclerotic heart disease  Reviewed and counseled on: -A1C target -Blood sugar  targets -Complications of uncontrolled diabetes  -Checking blood sugar before meals and bedtime and bring log next visit -All medications with mechanism of action and side effects -Hypoglycemia management: rule of 15's, Glucagon  Emergency Kit and medical alert ID -low-carb low-fat plate-method diet -At least 20 minutes of physical activity per day -Annual dilated retinal eye exam and foot exam -compliance and follow up needs -follow up as scheduled or earlier if problem gets worse  Call if blood sugar is less than 70 or consistently above 250    Take a 15 gm snack of carbohydrate at bedtime before you go to sleep if your blood sugar is less than 100.    If you are going to fast after midnight for a test or procedure, ask your physician for instructions on how to reduce/decrease your insulin  dose.    Call if blood sugar is less than 70 or consistently above 250  -Treating a low sugar by rule of 15  (15 gms of sugar every 15 min until sugar is more than 70) If you feel your sugar is low, test your sugar to be sure If your sugar is low (less than 70), then take 15 grams of a fast acting Carbohydrate (3-4 glucose tablets or glucose gel or 4 ounces of juice or regular soda) Recheck your sugar 15 min after treating low to make sure it is more than 70 If sugar is still less than 70, treat again with 15 grams of carbohydrate          Don't drive the hour of hypoglycemia  If unconscious/unable to eat or drink by mouth, use glucagon  injection or nasal spray baqsimi  and call 911. Can repeat again in 15 min if still unconscious.  Return in about 4 months (around 03/26/2024) for visit and 8 am labs before next visit.   I have reviewed current medications, nurse's notes, allergies, vital signs, past medical and surgical history, family medical history, and social history for this encounter. Counseled patient on symptoms, examination findings, lab findings, imaging results, treatment decisions and  monitoring and prognosis. The patient understood the recommendations and agrees with the treatment plan. All questions regarding treatment plan were fully answered.  David Birmingham, MD  11/25/23   History of Present Illness David Lindsey is a 68 y.o. year old male who presents for follow up of Type 2 diabetes mellitus.  David Lindsey was first diagnosed in 2013.   Diabetes education +  Home diabetes regimen: Farxiga  10 mg daily, ozempic  2 mg weekly   Humalog  75/25 mix 16 units in a.m. 16 units before dinner, right before eating   No history of MEN syndrome/medullary thyroid  cancer/pancreatitis or pancreatic cancer in self or family No UTI  Previous history:  Non-insulin  hypoglycemic drugs previously used: Metformin , Invokana , Amaryl , Prandin , victoza  Metformin  stopped in 2019 Farxiga  started in 2021 replacing Invokana  Insulin  was started in 5/20, generally has been on premixed insulin   COMPLICATIONS -  MI/Stroke -  retinopathy, last eye exam 2024 +  neuropathy +  nephropathy, GFR 33  BLOOD SUGAR DATA CGM interpretation: At today's visit, we reviewed her CGM downloads. The full report is scanned in the media. Reviewing the CGM trends, BG are well controlled mostly across the day, with few highs in night/overnight.  Physical Exam  BP (!) 140/70   Pulse 84   Ht 5' 9 (1.753 m)   Wt 284 lb (128.8 kg)   SpO2 96%   BMI 41.94 kg/m    Constitutional: well developed, well nourished Head: normocephalic, atraumatic Eyes: sclera anicteric, no redness Neck: supple Lungs: normal respiratory effort Neurology: alert and oriented Skin: dry, no appreciable rashes Musculoskeletal: no appreciable defects Psychiatric: normal mood and affect Diabetic Foot Exam - Simple   Simple Foot Form Diabetic Foot exam was performed with the following findings: Yes 11/25/2023 10:57 AM  Visual Inspection No deformities, no ulcerations, no other skin breakdown bilaterally: Yes Sensation  Testing Intact to touch and monofilament testing bilaterally: Yes Pulse Check Posterior Tibialis and Dorsalis pulse intact bilaterally: Yes Comments      Current Medications Patient's Medications  New Prescriptions   No medications on file  Previous Medications   AMLODIPINE  (NORVASC ) 10 MG TABLET    TAKE 1 TABLET BY MOUTH EVERY DAY   APPLE CIDER VINEGAR 600 MG CAPS    Take 600 mg by mouth daily.   BIOTIN W/ VITAMINS C & E (HAIR SKIN & NAILS GUMMIES PO)    Take 1 tablet by mouth every evening.   CARBOXYMETHYLCELLUL-GLYCERIN  (LUBRICATING EYE DROPS OP)    Place 1 drop into both eyes daily as needed (dry eyes).    CHOLECALCIFEROL (VITAMIN D3) 25 MCG (1000 UNIT) TABLET    Take 1,000 Units by mouth daily.   CLOTRIMAZOLE -BETAMETHASONE  (LOTRISONE ) CREAM    APPLY CREAM TOPICALLY THREE TIMES DAILY AS NEEDED FOR RASH   COENZYME Q10 (COQ-10) 200 MG CAPS    Take 200 mg by mouth every evening.    CONTINUOUS BLOOD GLUC SENSOR (FREESTYLE LIBRE 3 SENSOR) MISC    by Does not apply route.   CYANOCOBALAMIN (B-12) 5000 MCG CAPS    Take 5,000 mcg by mouth every evening.  DICLOFENAC SODIUM (VOLTAREN) 1 % GEL    Apply topically 4 (four) times daily.   DOCUSATE SODIUM  (COLACE) 100 MG CAPSULE    Take 200 mg by mouth every evening.    FARXIGA  10 MG TABS TABLET    TAKE 1 TABLET BY MOUTH DAILY BEFORE BREAKFAST.   FLUZONE HIGH-DOSE 0.5 ML INJECTION       FUROSEMIDE  (LASIX ) 20 MG TABLET    Take 1 tablet (20 mg total) by mouth daily as needed. Pt takes every other day as needed   GABAPENTIN  (NEURONTIN ) 300 MG CAPSULE    Take 1 capsule (300 mg total) by mouth 3 (three) times daily.   GLUCAGON  (GVOKE HYPOPEN  1-PACK) 1 MG/0.2ML SOAJ    INJECT 1 MG INTO THE SKIN AS NEEDED (LOW BLOOD SUAGR WITH IMPAIRED CONSCIOUSNESS).   GLUCOSE BLOOD (ONETOUCH ULTRA) TEST STRIP    Use as instructed Check blood sugars on waking up 3 days a week   GLUCOSE BLOOD TEST STRIP    Use as instructed twice a day at different times   INSULIN   LISPRO PROT & LISPRO (HUMALOG  MIX 75/25 KWIKPEN) (75-25) 100 UNIT/ML KWIKPEN    DIAL AND INJECT 16 UNITS UNDER THE SKIN DAILY WITH BREAKFAST AND DINNER. MAX DAILY DOSE 36 UNITS.   LISINOPRIL  (ZESTRIL ) 10 MG TABLET    TAKE 1 TABLET BY MOUTH EVERY DAY   MENTHOL , TOPICAL ANALGESIC, 8 % LIQD    Apply 1 application topically 3 (three) times daily as needed (for knee pain.).    METHYLCELLULOSE, LAXATIVE, 500 MG TABS    Take 500 mg by mouth daily. Fiberwell Gummies   METOPROLOL  SUCCINATE (TOPROL -XL) 25 MG 24 HR TABLET    Take 0.5 tablets (12.5 mg total) by mouth at bedtime.   MM PEN NEEDLES 31G X 8 MM MISC    AS DIRECTED WITH  VICTOZA    MULTIPLE VITAMIN (MULTIVITAMIN WITH MINERALS) TABS TABLET    Take 1 tablet by mouth every evening.    OMEGA-3-6-9 CAPS    Take 1 capsule by mouth every evening. 1600mg    OMEPRAZOLE  (PRILOSEC) 20 MG CAPSULE    TAKE 1 CAPSULE BY MOUTH EVERY DAY   ROSUVASTATIN  (CRESTOR ) 20 MG TABLET    TAKE 1 TABLET BY MOUTH EVERY DAY   SEMAGLUTIDE , 2 MG/DOSE, 8 MG/3ML SOPN    Inject 2 mg as directed once a week.   SILDENAFIL  (VIAGRA ) 100 MG TABLET    TAKE 1 TABLET BY MOUTH ONCE DAILY AS NEEDED   TRAZODONE  (DESYREL ) 100 MG TABLET    TAKE 1 TABLET BY MOUTH AT  BEDTIME   TROLAMINE SALICYLATE (ASPERCREME) 10 % CREAM    Apply 1 application topically as needed (knee pain).   VITAMIN E 400 UNIT CAPSULE    Take 400 Units by mouth every evening.   Modified Medications   No medications on file  Discontinued Medications   No medications on file    Allergies Allergies  Allergen Reactions   Pioglitazone Swelling    Past Medical History Past Medical History:  Diagnosis Date   ALCOHOLISM 12/22/2007   ANEMIA, IRON DEFICIENCY 12/26/2007   Arthritis    OA   Blood transfusion without reported diagnosis    as a child   DIABETES MELLITUS, TYPE II 11/10/2006   ESOPHAGITIS, REFLUX 12/22/2007   GERD (gastroesophageal reflux disease)    HYPERCHOLESTEROLEMIA 12/22/2007   HYPERTENSION 11/10/2006    HYPOGONADISM 12/22/2007   Prob. due to ETOH   HYPOKALEMIA 06/06/2008   Leukopenia  Neuromuscular disorder (HCC)    neuropathy in feet from Diabetes   Neuropathy    Chronic Neuropathy BOTH FEET   Pancytopenia 06/06/2008   RENAL INSUFFICIENCY 11/10/2006   SEES [place] [facility] STAGE 2 CKD    Past Surgical History Past Surgical History:  Procedure Laterality Date   APPENDECTOMY  AGE 78 OR 10   COLONOSCOPY  08-21-2004/2017   [facility]- normal/2017 [place]   COLONSCOPY  2017   ELECTROCARDIOGRAM  11/07/2006   ESOPHAGOGASTRODUODENOSCOPY  08/20/2004   EYE SURGERY Bilateral 2014   IOC FOR CATARACTS   I & D KNEE WITH POLY EXCHANGE Left 05/16/2019   Procedure: LEFT KNEE IRRIGATION AND DEBRIDEMENT WITH POLY EXCHANGE;  Surgeon: Liam Lerner, MD;  Location: [facility];  Service: Orthopedics;  Laterality: Left;   REPLACEMENT TOTAL KNEE Right 2011   TOTAL KNEE ARTHROPLASTY Left 02/20/2018   Procedure: LEFT TOTAL KNEE ARTHROPLASTY;  Surgeon: Liam Lerner, MD;  Location: [facility];  Service: Orthopedics;  Laterality: Left;   UPPER GASTROINTESTINAL ENDOSCOPY      Family History family history includes Cancer in his mother; Colon cancer (age of onset: 57) in his mother; Colon polyps in his brother.  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  Tobacco Use   Smoking status: Never   Smokeless tobacco: Never  Vaping Use   Vaping status: Never Used  Substance and Sexual Activity   Alcohol  use: No    Alcohol /week: 0.0 standard drinks of alcohol     Comment: LAST USE 10 YRS AGO   Drug use: No   Sexual activity: Not on file  Other Topics Concern   Not on file  Social History Narrative   Not on file   Social Drivers of Health   Financial Resource Strain: High  Risk (08/25/2023)   Overall Financial Resource Strain (CARDIA)    Difficulty of Paying Living Expenses: Hard  Food Insecurity: No Food Insecurity (08/25/2023)   Hunger Vital Sign    Worried About Running Out of Food in the Last Year: Never true    Ran Out of Food in the Last Year: Never true  Transportation Needs: No Transportation Needs (08/25/2023)   PRAPARE - Administrator, Civil Service (Medical): No    Lack of Transportation (Non-Medical): No  Physical Activity: Inactive (08/25/2023)   Exercise Vital Sign    Days of Exercise per Week: 0 days    Minutes of Exercise per Session: 0 min  Stress: No Stress Concern Present (08/25/2023)   [employer] of Occupational Health - Occupational Stress Questionnaire    Feeling of Stress : Not at all  Social Connections: Moderately Integrated (08/25/2023)   Social Connection and Isolation Panel    Frequency of Communication with Friends and Family: More than three times a week    Frequency of Social Gatherings with Friends and Family: Once a week    Attends Religious Services: More than 4 times per year    Active Member of [employer] or Organizations: No    Attends Banker Meetings: Never    Marital Status: Married  Catering manager Violence: Not At Risk (08/25/2023)   Humiliation, Afraid, Rape, and Kick questionnaire    Fear of Current  or Ex-Partner: No    Emotionally Abused: No    Physically Abused: No    Sexually Abused: No    Lab Results  Component Value Date   HGBA1C 7.2 (A) 11/25/2023   Lab Results  Component Value Date   CHOL 136 03/22/2023   Lab Results  Component Value Date   HDL 51 03/22/2023   Lab Results  Component Value Date   LDLCALC 71 03/22/2023   Lab Results  Component Value Date   TRIG 49 03/22/2023   Lab Results  Component Value Date   CHOLHDL 2.7 03/22/2023   Lab Results  Component Value Date   CREATININE 1.80 (H) 03/22/2023   Lab Results  Component Value Date   GFR 33.04 (L)  08/18/2022   Lab Results  Component Value Date   MICROALBUR 0.2 01/02/2009      Component Value Date/Time   NA 137 03/22/2023 0810   K 4.3 03/22/2023 0810   CL 104 03/22/2023 0810   CO2 25 03/22/2023 0810   GLUCOSE 156 (H) 03/22/2023 0810   BUN 19 03/22/2023 0810   CREATININE 1.80 (H) 03/22/2023 0810   CALCIUM  9.2 03/22/2023 0810   PROT 7.1 03/22/2023 0810   ALBUMIN 4.2 08/18/2022 1001   AST 20 03/22/2023 0810   ALT 32 03/22/2023 0810   ALKPHOS 56 08/18/2022 1001   BILITOT 0.4 03/22/2023 0810   GFRNONAA 38 (L) 05/17/2019 0421   GFRAA 44 (L) 05/17/2019 0421      Latest Ref Rng & Units 03/22/2023    8:10 AM 08/18/2022   10:01 AM 05/14/2022    8:47 AM  BMP  Glucose 65 - 99 mg/dL 843  869  887   BUN 7 - 25 mg/dL 19  19    Creatinine 9.29 - 1.35 mg/dL 8.19  7.96    BUN/Creat Ratio 6 - 22 (calc) 11     Sodium 135 - 146 mmol/L 137  137    Potassium 3.5 - 5.3 mmol/L 4.3  4.1    Chloride 98 - 110 mmol/L 104  102    CO2 20 - 32 mmol/L 25  25    Calcium  8.6 - 10.3 mg/dL 9.2  9.5         Component Value Date/Time   WBC 3.9 (L) 08/18/2022 1001   RBC 5.29 08/18/2022 1001   HGB 15.3 08/18/2022 1001   HCT 45.8 08/18/2022 1001   PLT 170.0 08/18/2022 1001   MCV 86.7 08/18/2022 1001   MCH 28.0 10/18/2019 1600   MCHC 33.3 08/18/2022 1001   RDW 13.7 08/18/2022 1001   LYMPHSABS 1.4 04/13/2022 1052   MONOABS 0.6 04/13/2022 1052   EOSABS 0.4 04/13/2022 1052   BASOSABS 0.0 04/13/2022 1052     Parts of this note may have been dictated using voice recognition software. There may be variances in spelling and vocabulary which are unintentional. Not all errors are proofread. Please notify the dino if any discrepancies are noted or if the meaning of any statement is not clear.

## 2023-11-28 ENCOUNTER — Encounter: Payer: Self-pay | Admitting: "Endocrinology

## 2023-11-30 ENCOUNTER — Other Ambulatory Visit: Payer: Self-pay | Admitting: Family

## 2023-11-30 DIAGNOSIS — E291 Testicular hypofunction: Secondary | ICD-10-CM

## 2023-12-06 ENCOUNTER — Encounter: Payer: Self-pay | Admitting: Podiatry

## 2023-12-06 ENCOUNTER — Ambulatory Visit (INDEPENDENT_AMBULATORY_CARE_PROVIDER_SITE_OTHER): Admitting: Podiatry

## 2023-12-06 ENCOUNTER — Ambulatory Visit (INDEPENDENT_AMBULATORY_CARE_PROVIDER_SITE_OTHER)

## 2023-12-06 VITALS — Ht 69.0 in | Wt 284.0 lb

## 2023-12-06 DIAGNOSIS — M25572 Pain in left ankle and joints of left foot: Secondary | ICD-10-CM | POA: Diagnosis not present

## 2023-12-06 DIAGNOSIS — M2141 Flat foot [pes planus] (acquired), right foot: Secondary | ICD-10-CM | POA: Diagnosis not present

## 2023-12-06 DIAGNOSIS — M25571 Pain in right ankle and joints of right foot: Secondary | ICD-10-CM

## 2023-12-06 DIAGNOSIS — M7752 Other enthesopathy of left foot: Secondary | ICD-10-CM | POA: Diagnosis not present

## 2023-12-06 DIAGNOSIS — B351 Tinea unguium: Secondary | ICD-10-CM | POA: Diagnosis not present

## 2023-12-06 DIAGNOSIS — M7751 Other enthesopathy of right foot: Secondary | ICD-10-CM | POA: Diagnosis not present

## 2023-12-06 DIAGNOSIS — M2142 Flat foot [pes planus] (acquired), left foot: Secondary | ICD-10-CM

## 2023-12-06 DIAGNOSIS — M778 Other enthesopathies, not elsewhere classified: Secondary | ICD-10-CM

## 2023-12-06 NOTE — Progress Notes (Signed)
 Patient presents for evaluation and treatment of tenderness and some redness around nails feet.  Tenderness around toes with walking and wearing shoes.  Complains in lieu of tenderness in the foot at times especially along the lateral foot and ankle and dorsal aspect of the foot.  Physical exam:  General appearance: Alert, pleasant, and in no acute distress.  Vascular: Pedal pulses: DP 2/4 B/L, PT 1/4 B/L.  Moderate edema lower legs bilaterally.  Capillary refill time immediate bilaterally  Neurological:  Decreased Achilles tendon reflex bilaterally.  Decreased vibratory sensation in feet bilaterally.  Negative Tinel sign tarsal tunnel and porta pedis bilaterally  Dermatologic:  Nails thickened, disfigured, discolored 1-5 BL with subungual debris.  Redness and hypertrophic nail folds along nail folds bilaterally but no signs of drainage or infection.  Skin is thin and atrophic with normal hair growth on the lower extremity.  Areas of hyperpigmentation.  Musculoskeletal:  Pes valgus planus bilaterally, left more severe than right.  Weakness in inversion of the subtalar joint on the left.  Tenderness in the sinus tarsi and with range of motion subtalar joint bilaterally.  Tenderness on the dorsal aspect of the foot over the second TMT bilaterally.   Diagnosis: 1. Painful onychomycotic nails 1 through 5 bilaterally. 2.  Arthralgia of feet bilaterally 3.  Pes planus bilaterally  Plan: -New patient office visit for evaluation and management level 3.  Modifier 25. - Discussed with him pes planus and arthralgia in the feet.  Probably also has a posterior tibial tendon dysfunction on the left.  Recommend he wear good supportive shoes with good arch support.  Avoid going in bare feet or wearing flat soled shoes -Debrided onychomycotic nails 1 through 5 bilaterally.  Return 3 month [facility]

## 2023-12-08 DIAGNOSIS — E1122 Type 2 diabetes mellitus with diabetic chronic kidney disease: Secondary | ICD-10-CM | POA: Diagnosis not present

## 2023-12-08 DIAGNOSIS — R609 Edema, unspecified: Secondary | ICD-10-CM | POA: Diagnosis not present

## 2023-12-08 DIAGNOSIS — E871 Hypo-osmolality and hyponatremia: Secondary | ICD-10-CM | POA: Diagnosis not present

## 2023-12-08 DIAGNOSIS — E872 Acidosis, unspecified: Secondary | ICD-10-CM | POA: Diagnosis not present

## 2023-12-08 DIAGNOSIS — I129 Hypertensive chronic kidney disease with stage 1 through stage 4 chronic kidney disease, or unspecified chronic kidney disease: Secondary | ICD-10-CM | POA: Diagnosis not present

## 2023-12-08 DIAGNOSIS — N1832 Chronic kidney disease, stage 3b: Secondary | ICD-10-CM | POA: Diagnosis not present

## 2023-12-08 DIAGNOSIS — D631 Anemia in chronic kidney disease: Secondary | ICD-10-CM | POA: Diagnosis not present

## 2023-12-13 ENCOUNTER — Other Ambulatory Visit: Payer: Self-pay | Admitting: Family

## 2023-12-30 ENCOUNTER — Other Ambulatory Visit: Payer: Self-pay | Admitting: Family

## 2024-01-01 ENCOUNTER — Other Ambulatory Visit: Payer: Self-pay | Admitting: Family

## 2024-01-16 ENCOUNTER — Other Ambulatory Visit: Payer: Self-pay | Admitting: Nurse Practitioner

## 2024-01-24 NOTE — Progress Notes (Signed)
 David Lindsey                                          MRN: [id number]   01/24/2024   The [facility] Quality Team Specialist reviewed this patient medical record for the purposes of chart review for care gap closure. The following were reviewed: chart review for care gap closure-kidney health evaluation for diabetes:eGFR  and uACR. Labs ordered 12/8 appt    [facility] Quality Team

## 2024-02-02 ENCOUNTER — Other Ambulatory Visit: Payer: Self-pay | Admitting: Nurse Practitioner

## 2024-02-02 NOTE — Telephone Encounter (Unsigned)
 Copied from CRM [id number]. Topic: Clinical - Medication Refill >> Feb 02, 2024  8:33 AM Rea C wrote: Medication: gabapentin  (NEURONTIN ) 300 MG capsule, traZODone  (DESYREL ) 100 MG tablet  Has the patient contacted their pharmacy? Pharmacy stated they need a new prescription for each item since patient has a new doctor.   Pharmacy called patient and stated that they are having a hard time reaching a provider for patients medication refills. He has a TOC next year scheduled with NP Roselie Mood. Was an existing patient of NP [name]. NP Leita is no longer active.   This is the patient's preferred pharmacy:   [facility] - [place], [place] - [address] [place] [address] Phone: [phone] Fax: [phone] Hours: Not open 24 hours   Is this the correct pharmacy for this prescription? Yes If no, delete pharmacy and type the correct one.   Has the prescription been filled recently? Yes  Is the patient out of the medication? Yes  Has the patient been seen for an appointment in the last year OR does the patient have an upcoming appointment? Yes  Can we respond through MyChart? Yes  Agent: Please be advised that Rx refills may take up to 3 business days. We ask that you follow-up with your pharmacy.

## 2024-02-02 NOTE — Telephone Encounter (Signed)
 Contacted patient to inform him his refill request must be done with his previous providers office. Patient has not yet seen Roselie for her to do his refills

## 2024-02-22 ENCOUNTER — Telehealth: Payer: Self-pay | Admitting: Nutrition

## 2024-02-22 NOTE — Telephone Encounter (Signed)
 Message left on my machine that they have sent over 2 faxes for diabetes supplies and have not heard from you.  Please send paperwork as soon as possible, as patient is almost out of supplies If Questions, please call Gabe at [phone]

## 2024-02-27 ENCOUNTER — Other Ambulatory Visit: Payer: Self-pay | Admitting: Family

## 2024-02-27 DIAGNOSIS — E291 Testicular hypofunction: Secondary | ICD-10-CM

## 2024-02-27 MED ORDER — METOPROLOL SUCCINATE ER 25 MG PO TB24: 12.5000 mg | ORAL_TABLET | Freq: Every day | ORAL | 2 refills | Status: AC

## 2024-02-27 MED ORDER — GABAPENTIN 300 MG PO CAPS: 300.0000 mg | ORAL_CAPSULE | Freq: Three times a day (TID) | ORAL | 1 refills | Status: AC

## 2024-02-27 MED ORDER — FUROSEMIDE 20 MG PO TABS
20.0000 mg | ORAL_TABLET | Freq: Every day | ORAL | 1 refills | Status: AC | PRN
Start: 2024-02-27 — End: ?

## 2024-02-27 MED ORDER — AMLODIPINE BESYLATE 10 MG PO TABS: 10.0000 mg | ORAL_TABLET | Freq: Every day | ORAL | 3 refills | Status: AC

## 2024-02-27 MED ORDER — SILDENAFIL CITRATE 100 MG PO TABS: 100.0000 mg | ORAL_TABLET | Freq: Every day | ORAL | 0 refills | Status: AC | PRN

## 2024-02-27 MED ORDER — CLOTRIMAZOLE-BETAMETHASONE 1-0.05 % EX CREA: TOPICAL_CREAM | CUTANEOUS | 0 refills | Status: DC

## 2024-02-27 MED ORDER — OMEPRAZOLE 20 MG PO CPDR: 20.0000 mg | DELAYED_RELEASE_CAPSULE | Freq: Every day | ORAL | 3 refills | Status: AC

## 2024-02-27 MED ORDER — LISINOPRIL 10 MG PO TABS: 10.0000 mg | ORAL_TABLET | Freq: Every day | ORAL | 3 refills | Status: AC

## 2024-02-27 NOTE — Telephone Encounter (Unsigned)
 Copied from CRM #[id number]. Topic: Clinical - Medication Refill >> Feb 27, 2024 11:02 AM Alfonso ORN wrote: Medication: clotrimazole -betamethasone  (LOTRISONE ) cream  Has the patient contacted their pharmacy? Yes   This is the patient's preferred pharmacy:  [facility] [address], [place] - [address]  [address] Phone: [phone] Fax: [phone]   Is this the correct pharmacy for this prescription? Yes If no, delete pharmacy and type the correct one.   Has the prescription been filled recently? No  Is the patient out of the medication? No  Has the patient been seen for an appointment in the last year OR does the patient have an upcoming appointment? Yes  Can we respond through MyChart? Yes  Agent: Please be advised that Rx refills may take up to 3 business days. We ask that you follow-up with your pharmacy.

## 2024-02-27 NOTE — Telephone Encounter (Signed)
 Copied from CRM #[id number]. Topic: Clinical - Prescription Issue >> Feb 27, 2024 11:05 AM Alfonso ORN wrote: Reason for CRM: pt stated new [facility]pc ( not with [facility] ) is requesting all refills be completed with [facility] office  while waiting new patient visit. Completed fill request for clotrimazole -betamethasone  (LOTRISONE ) cream

## 2024-02-28 NOTE — Telephone Encounter (Signed)
 Called pt lvm for pt to call back to get more information regarding his Dm supplies.

## 2024-02-28 NOTE — Telephone Encounter (Signed)
 Pt called back regarding the vm I left earlier about diabetes supplies he was requesting. Pt told me that he just needed pt assistance forms for [employer]illy [employer] and novo nordisk. I informed pt that I will leave pt assistance forms at the front desk for him to pick up.

## 2024-03-01 ENCOUNTER — Telehealth: Payer: Self-pay | Admitting: "Endocrinology

## 2024-03-01 NOTE — Telephone Encounter (Signed)
 Patient picked up patient assistance paperwork.  This was placed in the providers in box at the front desk.  Company is -  Secretary/administrator and Novo Nordisk

## 2024-03-13 ENCOUNTER — Ambulatory Visit: Admitting: Podiatry

## 2024-03-13 DIAGNOSIS — B351 Tinea unguium: Secondary | ICD-10-CM | POA: Diagnosis not present

## 2024-03-13 DIAGNOSIS — M79671 Pain in right foot: Secondary | ICD-10-CM | POA: Diagnosis not present

## 2024-03-13 DIAGNOSIS — M79672 Pain in left foot: Secondary | ICD-10-CM

## 2024-03-13 NOTE — Progress Notes (Signed)
 Patient presents for evaluation and treatment of tenderness and some redness around nails feet.  Tenderness around toes with walking and wearing shoes.  Physical exam:  General appearance: Alert, pleasant, and in no acute distress.  Vascular: Pedal pulses: DP 2/4 B/L, PT 1/4 B/L. Moderate edema lower legs bilaterally.  Capillary refill time immediate bilaterally  Neurologic:  Dermatologic:  Nails thickened, disfigured, discolored 1-5 BL with subungual debris.  Redness and hypertrophic nail folds along nail folds bilaterally but no signs of drainage or infection.  Musculoskeletal:     Diagnosis: 1. Painful onychomycotic nails 1 through 5 bilaterally. 2. Pain toes 1 through 5 bilaterally.  Plan: -Debrided onychomycotic nails 1 through 5 bilaterally.  Sharply debrided nails with nail clipper and reduced with a power bur.  Return 3 months [facility]

## 2024-03-19 ENCOUNTER — Other Ambulatory Visit

## 2024-03-20 LAB — COMPREHENSIVE METABOLIC PANEL WITH GFR
AG Ratio: 1.5 (calc) (ref 1.0–2.5)
ALT: 33 U/L (ref 9–46)
AST: 21 U/L (ref 10–35)
Albumin: 4.3 g/dL (ref 3.6–5.1)
Alkaline phosphatase (APISO): 53 U/L (ref 35–144)
BUN/Creatinine Ratio: 10 (calc) (ref 6–22)
BUN: 17 mg/dL (ref 7–25)
CO2: 26 mmol/L (ref 20–32)
Calcium: 9.5 mg/dL (ref 8.6–10.3)
Chloride: 103 mmol/L (ref 98–110)
Creat: 1.69 mg/dL — ABNORMAL HIGH (ref 0.70–1.28)
Globulin: 2.8 g/dL (ref 1.9–3.7)
Glucose, Bld: 152 mg/dL — ABNORMAL HIGH (ref 65–99)
Potassium: 4.1 mmol/L (ref 3.5–5.3)
Sodium: 136 mmol/L (ref 135–146)
Total Bilirubin: 0.4 mg/dL (ref 0.2–1.2)
Total Protein: 7.1 g/dL (ref 6.1–8.1)
eGFR: 43 mL/min/1.73m2 — ABNORMAL LOW (ref >=60)

## 2024-03-20 LAB — MICROALBUMIN / CREATININE URINE RATIO
Creatinine, Urine: 75 mg/dL (ref 20–320)
Microalb Creat Ratio: 44 mg/g{creat} — ABNORMAL HIGH (ref <30)
Microalb, Ur: 3.3 mg/dL

## 2024-03-20 LAB — LIPID PANEL
Cholesterol: 134 mg/dL (ref <200)
HDL: 50 mg/dL (ref >=40)
LDL Cholesterol (Calc): 68 mg/dL
Non-HDL Cholesterol (Calc): 84 mg/dL (ref <130)
Total CHOL/HDL Ratio: 2.7 (calc) (ref <5.0)
Triglycerides: 80 mg/dL (ref <150)

## 2024-03-20 LAB — HEMOGLOBIN A1C
Hgb A1c MFr Bld: 7.4 % — ABNORMAL HIGH (ref <5.7)
Mean Plasma Glucose: 166 mg/dL
eAG (mmol/L): 9.2 mmol/L

## 2024-03-23 ENCOUNTER — Other Ambulatory Visit: Payer: Self-pay | Admitting: Family

## 2024-03-26 ENCOUNTER — Ambulatory Visit: Admitting: "Endocrinology

## 2024-04-03 ENCOUNTER — Encounter: Payer: Self-pay | Admitting: "Endocrinology

## 2024-04-03 ENCOUNTER — Ambulatory Visit: Admitting: "Endocrinology

## 2024-04-03 VITALS — BP 122/80 | HR 86 | Ht 69.0 in | Wt 288.0 lb

## 2024-04-03 DIAGNOSIS — Z7985 Long-term (current) use of injectable non-insulin antidiabetic drugs: Secondary | ICD-10-CM | POA: Diagnosis not present

## 2024-04-03 DIAGNOSIS — Z7984 Long term (current) use of oral hypoglycemic drugs: Secondary | ICD-10-CM | POA: Diagnosis not present

## 2024-04-03 DIAGNOSIS — E7800 Pure hypercholesterolemia, unspecified: Secondary | ICD-10-CM | POA: Diagnosis not present

## 2024-04-03 DIAGNOSIS — E1140 Type 2 diabetes mellitus with diabetic neuropathy, unspecified: Secondary | ICD-10-CM

## 2024-04-03 DIAGNOSIS — Z794 Long term (current) use of insulin: Secondary | ICD-10-CM | POA: Diagnosis not present

## 2024-04-03 NOTE — Patient Instructions (Signed)

## 2024-04-03 NOTE — Progress Notes (Signed)
 "    Outpatient Endocrinology Note David Birmingham, MD  04/03/2024   David Lindsey [birth year] [id number]  Referring Provider: Jason Leita Repine, FNP Primary Care Provider: Jason Leita Repine, FNP (Inactive) Reason for consultation: Subjective   Assessment & Plan  Diagnoses and all orders for this visit:  Type 2 diabetes mellitus with diabetic neuropathy, with long-term current use of insulin  (HCC) -     Amb Ref to Medical Weight Management  Long term (current) use of oral hypoglycemic drugs  Long-term (current) use of injectable non-insulin  antidiabetic drugs  Insulin  dose changed (HCC)  Pure hypercholesterolemia   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 16 units before dinner, 15 min before eating (except 15 units bd the day pt takes ozempic  to avoid lows as experienced by patient currently) 11/25/23: ordered podiatry referral for flat foot, left foot base callus, rule out onychomysis, well controlled diabetic  No known contraindications to any of above medications Glucagon  ordered, 08/19/22  Hyperlipidemia -Last LDL near goal: 68 (12/825) -on rosuvastatin  20 mg every day since 08/19/22 -Follow low fat diet and exercise   -Blood pressure goal <140/90 - Microalbumin/creatinine goal < 30 -On lisinopril  10 mg, sees nephrologist  -diet changes including salt restriction -limit eating outside -counseled BP targets per standards of diabetes care -Uncontrolled blood pressure can lead to retinopathy, nephropathy and cardiovascular and atherosclerotic heart disease  Reviewed and counseled on: -A1C target -Blood sugar  targets -Complications of uncontrolled diabetes  -Checking blood sugar before meals and bedtime and bring log next visit -All medications with mechanism of action and side effects -Hypoglycemia management: rule of 15's, Glucagon  Emergency Kit and medical alert ID -low-carb low-fat plate-method diet -At least 20 minutes of physical activity per day -Annual dilated retinal eye exam and foot exam -compliance and follow up needs -follow up as scheduled or earlier if problem gets worse  Call if blood sugar is less than 70 or consistently above 250    Take a 15 gm snack of carbohydrate at bedtime before you go to sleep if your blood sugar is less than 100.    If you are going to fast after midnight for a test or procedure, ask your physician for instructions on how to reduce/decrease your insulin  dose.    Call if blood sugar is less than 70 or consistently above 250  -Treating a low sugar by rule of 15  (15 gms of sugar every 15 min until sugar is more than 70) If you feel your sugar is low, test your sugar to be sure If your sugar is low (less than 70), then take 15 grams of a fast acting Carbohydrate (3-4 glucose tablets or glucose gel or 4 ounces of juice or regular soda) Recheck your sugar 15 min after treating low to make sure it is more than 70 If sugar is still less than 70, treat again with 15 grams of carbohydrate          Don't drive the hour of hypoglycemia  If unconscious/unable to eat or drink by mouth, use glucagon  injection or nasal spray baqsimi and call 911. Can repeat again in  15 min if still unconscious.  Return in about 5 months (around 09/01/2024).   I have reviewed current medications, nurse's notes, allergies, vital signs, past medical and surgical history, family medical history, and social history for this encounter. Counseled patient on symptoms, examination findings, lab findings, imaging results, treatment decisions and monitoring and prognosis. The patient  understood the recommendations and agrees with the treatment plan. All questions regarding treatment plan were fully answered.  David Birmingham, MD  04/03/2024   History of Present Illness David Lindsey is a 68 y.o. year old male who presents for follow up of Type 2 diabetes mellitus.  David Lindsey was first diagnosed in 2013.   Diabetes education +  Home diabetes regimen: Farxiga  10 mg daily, ozempic  2 mg weekly   Humalog  75/25 mix 16 units in a.m. 16 units before dinner, right before eating   No history of MEN syndrome/medullary thyroid  cancer/pancreatitis or pancreatic cancer in self or family No UTI  Previous history:  Non-insulin  hypoglycemic drugs previously used: Metformin , Invokana , Amaryl , Prandin , victoza  Metformin  stopped in 2019 Farxiga  started in 2021 replacing Invokana  Insulin  was started in 5/20, generally has been on premixed insulin   COMPLICATIONS -  MI/Stroke -  retinopathy, last eye exam 05/2023 +  neuropathy +  nephropathy, GFR 43  BLOOD SUGAR DATA CGM interpretation: At today's visit, we reviewed her CGM downloads. The full report is scanned in the media. Reviewing the CGM trends, BG are well controlled mostly across the day, with few highs in th evening (overall 15% high).  Physical Exam  BP 122/80   Pulse 86   Ht 5' 9 (1.753 m)   Wt 288 lb (130.6 kg)   SpO2 96%   BMI 42.53 kg/m    Constitutional: well developed, well nourished Head: normocephalic, atraumatic Eyes: sclera anicteric, no redness Neck: supple Lungs: normal respiratory effort Neurology: alert and oriented Skin: dry, no appreciable rashes Musculoskeletal: no appreciable defects Psychiatric: normal mood and affect Diabetic Foot Exam - Simple   No data filed      Current Medications Patient's Medications  New Prescriptions   No medications on file  Previous Medications   AMLODIPINE  (NORVASC ) 10 MG TABLET    Take 1 tablet (10 mg total) by mouth daily.   APPLE CIDER  VINEGAR 600 MG CAPS    Take 600 mg by mouth daily.   BIOTIN W/ VITAMINS C & E (HAIR SKIN & NAILS GUMMIES PO)    Take 1 tablet by mouth every evening.   CARBOXYMETHYLCELLUL-GLYCERIN  (LUBRICATING EYE DROPS OP)    Place 1 drop into both eyes daily as needed (dry eyes).    CHOLECALCIFEROL (VITAMIN D3) 25 MCG (1000 UNIT) TABLET    Take 1,000 Units by mouth daily.   CLOTRIMAZOLE -BETAMETHASONE  (LOTRISONE ) CREAM    APPLY CREAM TOPICALLY THREE TIMES DAILY AS NEEDED FOR RASH   COENZYME Q10 (COQ-10) 200 MG CAPS    Take 200 mg by mouth every evening.    CONTINUOUS BLOOD GLUC SENSOR (FREESTYLE LIBRE 3 SENSOR) MISC    by Does not apply route.   CYANOCOBALAMIN (B-12) 5000 MCG CAPS    Take 5,000 mcg by mouth every evening.    DICLOFENAC SODIUM (VOLTAREN) 1 % GEL    Apply topically 4 (four) times daily.   DOCUSATE SODIUM  (COLACE) 100 MG CAPSULE    Take 200 mg by mouth every evening.    FARXIGA  10 MG TABS TABLET    TAKE 1 TABLET BY MOUTH DAILY BEFORE BREAKFAST.   FLUZONE  HIGH-DOSE 0.5 ML INJECTION       FUROSEMIDE  (LASIX ) 20 MG TABLET    Take 1 tablet (20 mg total) by mouth daily as needed. Pt takes every other day as needed   GABAPENTIN  (NEURONTIN ) 300 MG CAPSULE    Take 1 capsule (300 mg total) by mouth 3 (three) times daily.   GLUCAGON  (GVOKE HYPOPEN  1-PACK) 1 MG/0.2ML SOAJ    INJECT 1 MG INTO THE SKIN AS NEEDED (LOW BLOOD SUAGR WITH IMPAIRED CONSCIOUSNESS).   GLUCOSE BLOOD (ONETOUCH ULTRA) TEST STRIP    Use as instructed Check blood sugars on waking up 3 days a week   GLUCOSE BLOOD TEST STRIP    Use as instructed twice a day at different times   INSULIN  LISPRO PROT & LISPRO (HUMALOG  MIX 75/25 KWIKPEN) (75-25) 100 UNIT/ML KWIKPEN    DIAL AND INJECT 16 UNITS UNDER THE SKIN DAILY WITH BREAKFAST AND DINNER. MAX DAILY DOSE 36 UNITS.   LISINOPRIL  (ZESTRIL ) 10 MG TABLET    Take 1 tablet (10 mg total) by mouth daily.   MENTHOL , TOPICAL ANALGESIC, 8 % LIQD    Apply 1 application topically 3 (three) times daily as needed  (for knee pain.).    METHYLCELLULOSE, LAXATIVE, 500 MG TABS    Take 500 mg by mouth daily. Fiberwell Gummies   METOPROLOL  SUCCINATE (TOPROL -XL) 25 MG 24 HR TABLET    Take 0.5 tablets (12.5 mg total) by mouth at bedtime.   MM PEN NEEDLES 31G X 8 MM MISC    AS DIRECTED WITH  VICTOZA    MULTIPLE VITAMIN (MULTIVITAMIN WITH MINERALS) TABS TABLET    Take 1 tablet by mouth every evening.    OMEGA-3-6-9 CAPS    Take 1 capsule by mouth every evening. 1600mg    OMEPRAZOLE  (PRILOSEC) 20 MG CAPSULE    Take 1 capsule (20 mg total) by mouth daily.   ROSUVASTATIN  (CRESTOR ) 20 MG TABLET    TAKE 1 TABLET BY MOUTH EVERY DAY   SEMAGLUTIDE , 2 MG/DOSE, 8 MG/3ML SOPN    Inject 2 mg as directed once a week.   SILDENAFIL  (VIAGRA ) 100 MG TABLET    Take 1 tablet (100 mg total) by mouth daily as needed.   TRAZODONE  (DESYREL ) 100 MG TABLET    TAKE 1 TABLET BY MOUTH AT  BEDTIME   TROLAMINE SALICYLATE (ASPERCREME) 10 % CREAM    Apply 1 application topically as needed (knee pain).   VITAMIN E 400 UNIT CAPSULE    Take 400 Units by mouth every evening.   Modified Medications   No medications on file  Discontinued Medications   No medications on file    Allergies Allergies  Allergen Reactions   Pioglitazone Swelling    Past Medical History Past Medical History:  Diagnosis Date   ALCOHOLISM 12/22/2007   ANEMIA, IRON DEFICIENCY 12/26/2007   Arthritis    OA   Blood transfusion without reported diagnosis    as a child   DIABETES MELLITUS, TYPE II 11/10/2006   ESOPHAGITIS, REFLUX 12/22/2007   GERD (gastroesophageal reflux disease)    HYPERCHOLESTEROLEMIA 12/22/2007   HYPERTENSION 11/10/2006   HYPOGONADISM 12/22/2007   Prob. due to ETOH   HYPOKALEMIA 06/06/2008   Leukopenia    Neuromuscular disorder (HCC)    neuropathy in feet from Diabetes   Neuropathy    Chronic Neuropathy BOTH FEET   Pancytopenia 06/06/2008   RENAL INSUFFICIENCY 11/10/2006   SEES [facility] STAGE 2 CKD    Past Surgical History Past Surgical  History:  Procedure Laterality Date   APPENDECTOMY  AGE 63 OR 10   COLONOSCOPY  08-21-2004/2017   [facility]- normal/2017 [place]   COLONSCOPY  2017   ELECTROCARDIOGRAM  11/07/2006   ESOPHAGOGASTRODUODENOSCOPY  08/20/2004   EYE SURGERY Bilateral 2014   IOC FOR CATARACTS   I & D KNEE WITH POLY EXCHANGE Left 05/16/2019   Procedure: LEFT KNEE IRRIGATION AND DEBRIDEMENT WITH POLY EXCHANGE;  Surgeon: Liam Lerner, MD;  Location: [facility];  Service: Orthopedics;  Laterality: Left;   REPLACEMENT TOTAL KNEE Right 2011   TOTAL KNEE ARTHROPLASTY Left 02/20/2018   Procedure: LEFT TOTAL KNEE ARTHROPLASTY;  Surgeon: Liam Lerner, MD;  Location: [facility];  Service: Orthopedics;  Laterality: Left;   UPPER GASTROINTESTINAL ENDOSCOPY      Family History family history includes Cancer in his mother; Colon cancer (age of onset: 77) in his mother; Colon polyps in his brother.  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  Tobacco Use   Smoking status: Never   Smokeless tobacco: Never  Vaping Use   Vaping status: Never Used  Substance and Sexual Activity   Alcohol  use: No    Alcohol /week: 0.0 standard drinks of alcohol     Comment: LAST USE 10 YRS AGO   Drug use: No   Sexual activity: Not on file  Other Topics Concern   Not on file  Social History Narrative   Not on file   Social Drivers of Health   Tobacco Use: Low Risk (04/03/2024)   Patient History    Smoking Tobacco Use: Never    Smokeless Tobacco Use: Never    Passive Exposure: Not on file  Financial Resource Strain: High Risk (08/25/2023)   Overall Financial Resource Strain (CARDIA)    Difficulty of Paying Living Expenses: Hard  Food Insecurity: No Food Insecurity (08/25/2023)   Hunger Vital Sign     Worried About Running Out of Food in the Last Year: Never true    Ran Out of Food in the Last Year: Never true  Transportation Needs: No Transportation Needs (08/25/2023)   PRAPARE - Administrator, Civil Service (Medical): No    Lack of Transportation (Non-Medical): No  Physical Activity: Inactive (08/25/2023)   Exercise Vital Sign    Days of Exercise per Week: 0 days    Minutes of Exercise per Session: 0 min  Stress: No Stress Concern Present (08/25/2023)   [employer] of Occupational Health - Occupational Stress Questionnaire    Feeling of Stress : Not at all  Social Connections: Moderately Integrated (08/25/2023)   Social Connection and Isolation Panel    Frequency of Communication with Friends and Family: More than three times a week    Frequency of Social Gatherings with Friends and Family: Once a week    Attends Religious Services: More than 4 times per year    Active Member of [employer] or Organizations: No    Attends Banker Meetings: Never    Marital Status: Married  Catering Manager Violence: Not At Risk (08/25/2023)   Humiliation, Afraid, Rape, and Kick questionnaire    Fear of Current or Ex-Partner: No    Emotionally Abused: No    Physically Abused: No  Sexually Abused: No  Depression (PHQ2-9): Low Risk (08/25/2023)   Depression (PHQ2-9)    PHQ-2 Score: 0  Alcohol  Screen: Low Risk (08/25/2023)   Alcohol  Screen    Last Alcohol  Screening Score (AUDIT): 0  Housing: Low Risk (08/25/2023)   Housing Stability Vital Sign    Unable to Pay for Housing in the Last Year: No    Number of Times Moved in the Last Year: 0    Homeless in the Last Year: No  Utilities: Not At Risk (08/25/2023)   AHC Utilities    Threatened with loss of utilities: No  Health Literacy: Adequate Health Literacy (08/25/2023)   B1300 Health Literacy    Frequency of need for help with medical instructions: Never    Lab Results  Component Value Date   HGBA1C 7.4 (H) 03/19/2024    Lab Results  Component Value Date   CHOL 134 03/19/2024   Lab Results  Component Value Date   HDL 50 03/19/2024   Lab Results  Component Value Date   LDLCALC 68 03/19/2024   Lab Results  Component Value Date   TRIG 80 03/19/2024   Lab Results  Component Value Date   CHOLHDL 2.7 03/19/2024   Lab Results  Component Value Date   CREATININE 1.69 (H) 03/19/2024   Lab Results  Component Value Date   GFR 33.04 (L) 08/18/2022   Lab Results  Component Value Date   MICROALBUR 3.3 03/19/2024      Component Value Date/Time   NA 136 03/19/2024 0811   K 4.1 03/19/2024 0811   CL 103 03/19/2024 0811   CO2 26 03/19/2024 0811   GLUCOSE 152 (H) 03/19/2024 0811   BUN 17 03/19/2024 0811   CREATININE 1.69 (H) 03/19/2024 0811   CALCIUM  9.5 03/19/2024 0811   PROT 7.1 03/19/2024 0811   ALBUMIN 4.2 08/18/2022 1001   AST 21 03/19/2024 0811   ALT 33 03/19/2024 0811   ALKPHOS 56 08/18/2022 1001   BILITOT 0.4 03/19/2024 0811   GFRNONAA 38 (L) 05/17/2019 0421   GFRAA 44 (L) 05/17/2019 0421      Latest Ref Rng & Units 03/19/2024    8:11 AM 03/22/2023    8:10 AM 08/18/2022   10:01 AM  BMP  Glucose 65 - 99 mg/dL 847  843  869   BUN 7 - 25 mg/dL 17  19  19    Creatinine 0.70 - 1.28 mg/dL 8.30  8.19  7.96   BUN/Creat Ratio 6 - 22 (calc) 10  11    Sodium 135 - 146 mmol/L 136  137  137   Potassium 3.5 - 5.3 mmol/L 4.1  4.3  4.1   Chloride 98 - 110 mmol/L 103  104  102   CO2 20 - 32 mmol/L 26  25  25    Calcium  8.6 - 10.3 mg/dL 9.5  9.2  9.5        Component Value Date/Time   WBC 3.9 (L) 08/18/2022 1001   RBC 5.29 08/18/2022 1001   HGB 15.3 08/18/2022 1001   HCT 45.8 08/18/2022 1001   PLT 170.0 08/18/2022 1001   MCV 86.7 08/18/2022 1001   MCH 28.0 10/18/2019 1600   MCHC 33.3 08/18/2022 1001   RDW 13.7 08/18/2022 1001   LYMPHSABS 1.4 04/13/2022 1052   MONOABS 0.6 04/13/2022 1052   EOSABS 0.4 04/13/2022 1052   BASOSABS 0.0 04/13/2022 1052     Parts of this note may have  been dictated using voice recognition software. There may  be variances in spelling and vocabulary which are unintentional. Not all errors are proofread. Please notify the dino if any discrepancies are noted or if the meaning of any statement is not clear.   "

## 2024-04-06 ENCOUNTER — Encounter: Payer: Self-pay | Admitting: "Endocrinology

## 2024-04-22 ENCOUNTER — Other Ambulatory Visit: Payer: Self-pay | Admitting: "Endocrinology

## 2024-04-22 DIAGNOSIS — E1122 Type 2 diabetes mellitus with diabetic chronic kidney disease: Secondary | ICD-10-CM

## 2024-04-23 NOTE — Telephone Encounter (Signed)
 Requested Prescriptions   Pending Prescriptions Disp Refills   FARXIGA  10 MG TABS tablet [Pharmacy Med Name: FARXIGA  10 MG TABLET] 30 tablet 5    Sig: TAKE 1 TABLET BY MOUTH DAILY BEFORE BREAKFAST.

## 2024-04-26 ENCOUNTER — Encounter: Admitting: Family

## 2024-04-27 ENCOUNTER — Encounter: Payer: Self-pay | Admitting: "Endocrinology

## 2024-04-27 DIAGNOSIS — E1122 Type 2 diabetes mellitus with diabetic chronic kidney disease: Secondary | ICD-10-CM

## 2024-04-27 MED ORDER — SEMAGLUTIDE (2 MG/DOSE) 8 MG/3ML ~~LOC~~ SOPN: 2.0000 mg | PEN_INJECTOR | SUBCUTANEOUS | 2 refills | Status: AC

## 2024-05-01 ENCOUNTER — Encounter: Payer: Self-pay | Admitting: Nurse Practitioner

## 2024-05-01 ENCOUNTER — Ambulatory Visit: Admitting: Nurse Practitioner

## 2024-05-01 VITALS — BP 122/76 | HR 88 | Temp 98.0°F | Ht 69.0 in | Wt 288.8 lb

## 2024-05-01 DIAGNOSIS — E1169 Type 2 diabetes mellitus with other specified complication: Secondary | ICD-10-CM | POA: Diagnosis not present

## 2024-05-01 DIAGNOSIS — E1122 Type 2 diabetes mellitus with diabetic chronic kidney disease: Secondary | ICD-10-CM

## 2024-05-01 DIAGNOSIS — N1832 Chronic kidney disease, stage 3b: Secondary | ICD-10-CM

## 2024-05-01 DIAGNOSIS — Z7984 Long term (current) use of oral hypoglycemic drugs: Secondary | ICD-10-CM

## 2024-05-01 DIAGNOSIS — Z794 Long term (current) use of insulin: Secondary | ICD-10-CM | POA: Diagnosis not present

## 2024-05-01 DIAGNOSIS — I10 Essential (primary) hypertension: Secondary | ICD-10-CM

## 2024-05-01 DIAGNOSIS — L304 Erythema intertrigo: Secondary | ICD-10-CM

## 2024-05-01 DIAGNOSIS — E119 Type 2 diabetes mellitus without complications: Secondary | ICD-10-CM | POA: Insufficient documentation

## 2024-05-01 DIAGNOSIS — Z7985 Long-term (current) use of injectable non-insulin antidiabetic drugs: Secondary | ICD-10-CM | POA: Diagnosis not present

## 2024-05-01 MED ORDER — KETOCONAZOLE 2 % EX CREA: 1.0000 | TOPICAL_CREAM | Freq: Every day | CUTANEOUS | 1 refills | Status: AC

## 2024-05-01 NOTE — Assessment & Plan Note (Addendum)
 Under the care of Dr. Dartha. Last hgbA1c at 7.4% Current use of farxiga , ozempic , and Humalog  mix 75/25. Positive neuropathy and nephropathy BP at goal Current use of statin and lisinopril  Has chronic rash in groin, minimal improvement with lotrisone .  Sent ketoconazole  cream Encouraged to maintain adequate oral hydration, and need for weight loss. Encouraged to make diet modifications per recommendations from previous appointment with nutritionist. Encouraged to maintain daily exercise of home exercise machine. F/up in 3-40months

## 2024-05-01 NOTE — Patient Instructions (Signed)
 Start ketoconazole  cream for rash between thigh. Maintain adequate oral hydration: 64-70oz daily Avoid artificial sweeteners. Maintain daily exercise  Diabetes Mellitus and Exercise Regular exercise is important for your health, especially if you have diabetes mellitus. Exercise is not just about losing weight. It can also help you increase muscle strength and bone density and reduce body fat and stress. This can help your level of endurance and make you more fit and flexible. Why should I exercise if I have diabetes? Exercise has many benefits for people with diabetes. It can: Help lower and control your blood sugar (glucose). Help your body respond better and become more sensitive to the hormone insulin . Reduce how much insulin  your body needs. Lower your risk for heart disease by: Lowering how much bad cholesterol and triglycerides you have in your body. Increasing how much good cholesterol you have in your body. Lowering your blood pressure. Lowering your blood glucose levels. What is my activity plan? Your health care provider or an expert trained in diabetes care (certified diabetes educator) can help you make an activity plan. This plan can help you find the type of exercise that works for you. It may also tell you how often to exercise and for how long. Be sure to: Get at least 150 minutes of medium-intensity or high-intensity exercise each week. This may involve brisk walking, biking, or water  aerobics. Do stretching and strengthening exercises at least 2 times a week. This may involve yoga or weight lifting. Spread out your activity over at least 3 days of the week. Get some form of physical activity each day. Do not go more than 2 days in a row without some kind of activity. Avoid being inactive for more than 30 minutes at a time. Take frequent breaks to walk or stretch. Choose activities that you enjoy. Set goals that you know you can accomplish. Start slowly and increase  the intensity of your exercise over time. How do I manage my diabetes during exercise?  Monitor your blood glucose Check your blood glucose before and after you exercise. If your blood glucose is 240 mg/dL (86.6 mmol/L) or higher before you exercise, check your urine for ketones. These are chemicals created by the liver. If you have ketones in your urine, do not exercise until your blood glucose returns to normal. If your blood glucose is 100 mg/dL (5.6 mmol/L) or lower, eat a snack that has 15-20 grams of carbohydrate in it. Check your blood glucose 15 minutes after the snack to make sure that your level is above 100 mg/dL (5.6 mmol/L) before you start to exercise. Your risk for low blood glucose (hypoglycemia) goes up during and after exercise. Know the symptoms of this condition and how to treat it. Follow these instructions at home: Keep a carbohydrate snack on hand for use before, during, and after exercise. This can help prevent or treat hypoglycemia. Avoid injecting insulin  into parts of your body that are going to be used during exercise. This may include: Your arms, when you are going to play tennis. Your legs, when you are about to go jogging. Keep track of your exercise habits. This can help you and your health care provider watch and adjust your activity plan. Write down: What you eat before and after you exercise. Blood glucose levels before and after you exercise. The type and amount of exercise you do. Talk to your health care provider before you start a new activity. They may need to: Make sure that the activity is  safe for you. Adjust your insulin , other medicines, and food that you eat. Drink water  while you exercise. This can stop you from losing too much water  (dehydration). It can also prevent problems caused by having a lot of heat in your body (heat stroke). Where to find more information American Diabetes Association: diabetes.org Association of Diabetes Care &  Education Specialists: diabeteseducator.org This information is not intended to replace advice given to you by your health care provider. Make sure you discuss any questions you have with your health care provider. Document Revised: 09/16/2021 Document Reviewed: 09/16/2021 Elsevier Patient Education  2024 [employer].

## 2024-05-01 NOTE — Assessment & Plan Note (Signed)
 BP at goal with lisinopril  BP Readings from Last 3 Encounters:  05/01/24 122/76  04/03/24 122/80  11/25/23 (!) 140/70    Answered his questions about use of lisinopril  and possible side effects. Maintain med dose

## 2024-05-01 NOTE — Assessment & Plan Note (Signed)
 Elevated UACr with Gfr at 77ml/min and creat. 1.69. Under the care of [facility] Current use of farxiga  10mg , furosemide  10mg  prn, and lisinopril  10mg .

## 2024-05-01 NOTE — Progress Notes (Signed)
 "               Established Patient Visit  Patient: David Lindsey   DOB: 12-22-1955   69 y.o. Male  MRN: [id number] Visit Date: 05/01/2024  Subjective:    Chief Complaint  Patient presents with   Transitions Of Care    Transferring care-Discuss lisinopril  medication  Refills needed    HPI Transfer care from David Elbe, NP, last appointment 04/2023. He is also under the care of endocrinology: Dr. Dartha [facility]. Essential hypertension BP at goal with lisinopril  BP Readings from Last 3 Encounters:  05/01/24 122/76  04/03/24 122/80  11/25/23 (!) 140/70    Answered his questions about use of lisinopril  and possible side effects. Maintain med dose  Type 2 diabetes mellitus with stage 3b chronic kidney disease, with long-term current use of insulin  (HCC) Elevated UACr with Gfr at 1ml/min and creat. 1.69. Under the care of [facility] Current use of farxiga  10mg , furosemide  10mg  prn, and lisinopril  10mg .  DM (diabetes mellitus) (HCC) Under the care of Dr. Dartha. Last hgbA1c at 7.4% Current use of farxiga , ozempic , and Humalog  mix 75/25. Positive neuropathy and nephropathy BP at goal Current use of statin and lisinopril  Has chronic rash in groin, minimal improvement with lotrisone .  Sent ketoconazole  cream Encouraged to maintain adequate oral hydration, and need for weight loss. Encouraged to make diet modifications per recommendations from previous appointment with nutritionist. Encouraged to maintain daily exercise of home exercise machine. F/up in 3-107months  Wt Readings from Last 3 Encounters:  05/01/24 288 lb 12.8 oz (131 kg)  04/03/24 288 lb (130.6 kg)  12/06/23 284 lb (128.8 kg)    Reviewed medical, surgical, and social history today  Medications: Show/hide medication list[1] Reviewed past medical and social history.   ROS per HPI above      Objective:  BP 122/76 (BP Location: Left Arm, Patient Position: Sitting,  Cuff Size: Large)   Pulse 88   Temp 98 F (36.7 C) (Oral)   Ht 5' 9 (1.753 m)   Wt 288 lb 12.8 oz (131 kg)   SpO2 99%   BMI 42.65 kg/m      Physical Exam Vitals and nursing note reviewed.  Constitutional:      Appearance: He is obese.  Cardiovascular:     Rate and Rhythm: Normal rate and regular rhythm.     Pulses: Normal pulses.     Heart sounds: Normal heart sounds.  Pulmonary:     Effort: Pulmonary effort is normal.     Breath sounds: Normal breath sounds.  Skin:    Findings: Rash present. No erythema.  Neurological:     Mental Status: He is alert and oriented to person, place, and time.     No results found for any visits on 05/01/24.    Assessment & Plan:    Problem List Items Addressed This Visit     Diabetes mellitus (HCC)   DM (diabetes mellitus) (HCC)   Under the care of Dr. Dartha. Last hgbA1c at 7.4% Current use of farxiga , ozempic , and Humalog  mix 75/25. Positive neuropathy and nephropathy BP at goal Current use of statin and lisinopril  Has chronic rash in groin, minimal improvement with lotrisone .  Sent ketoconazole  cream Encouraged to maintain adequate oral hydration, and need for weight loss. Encouraged to make diet modifications per recommendations from previous appointment with nutritionist. Encouraged to maintain daily exercise of home exercise machine. F/up in 3-40months      Essential  hypertension   BP at goal with lisinopril  BP Readings from Last 3 Encounters:  05/01/24 122/76  04/03/24 122/80  11/25/23 (!) 140/70    Answered his questions about use of lisinopril  and possible side effects. Maintain med dose      Obesity, morbid (HCC)   Type 2 diabetes mellitus with stage 3b chronic kidney disease, with long-term current use of insulin  (HCC) - Primary   Elevated UACr with Gfr at 66ml/min and creat. 1.69. Under the care of [facility] Current use of farxiga  10mg , furosemide  10mg  prn, and lisinopril  10mg .       Other Visit Diagnoses       Intertrigo       Relevant Medications   ketoconazole  (NIZORAL ) 2 % cream      Return in about 6 months (around 10/29/2024) for DM, hyperlipidemia (fasting).     Roselie Mood, NP       [1]  Outpatient Medications Prior to Visit  Medication Sig   amLODipine  (NORVASC ) 10 MG tablet Take 1 tablet (10 mg total) by mouth daily.   Carboxymethylcellul-Glycerin  (LUBRICATING EYE DROPS OP) Place 1 drop into both eyes daily as needed (dry eyes).    cholecalciferol (VITAMIN D3) 25 MCG (1000 UNIT) tablet Take 1,000 Units by mouth daily.   clotrimazole -betamethasone  (LOTRISONE ) cream APPLY CREAM TOPICALLY THREE TIMES DAILY AS NEEDED FOR RASH   Coenzyme Q10 (COQ-10) 200 MG CAPS Take 200 mg by mouth every evening.    Continuous Blood Gluc Sensor (FREESTYLE LIBRE 3 SENSOR) MISC by Does not apply route.   Cyanocobalamin (B-12) 5000 MCG CAPS Take 5,000 mcg by mouth every evening.    diclofenac Sodium (VOLTAREN) 1 % GEL Apply topically 4 (four) times daily.   docusate sodium  (COLACE) 100 MG capsule Take 200 mg by mouth every evening.    FARXIGA  10 MG TABS tablet TAKE 1 TABLET BY MOUTH DAILY BEFORE BREAKFAST.   furosemide  (LASIX ) 20 MG tablet Take 1 tablet (20 mg total) by mouth daily as needed. Pt takes every other day as needed   gabapentin  (NEURONTIN ) 300 MG capsule Take 1 capsule (300 mg total) by mouth 3 (three) times daily.   Glucagon  (GVOKE HYPOPEN  1-PACK) 1 MG/0.2ML SOAJ INJECT 1 MG INTO THE SKIN AS NEEDED (LOW BLOOD SUAGR WITH IMPAIRED CONSCIOUSNESS).   Insulin  Lispro Prot & Lispro (HUMALOG  MIX 75/25 KWIKPEN) (75-25) 100 UNIT/ML Kwikpen DIAL AND INJECT 16 UNITS UNDER THE SKIN DAILY WITH BREAKFAST AND DINNER. MAX DAILY DOSE 36 UNITS.   lisinopril  (ZESTRIL ) 10 MG tablet Take 1 tablet (10 mg total) by mouth daily.   Menthol , Topical Analgesic, 8 % LIQD Apply 1 application topically 3 (three) times daily as needed (for knee pain.).    Methylcellulose, Laxative, 500  MG TABS Take 500 mg by mouth daily. Fiberwell Gummies   metoprolol  succinate (TOPROL -XL) 25 MG 24 hr tablet Take 0.5 tablets (12.5 mg total) by mouth at bedtime.   Multiple Vitamin (MULTIVITAMIN WITH MINERALS) TABS tablet Take 1 tablet by mouth every evening.    Omega-3-6-9 CAPS Take 1 capsule by mouth every evening. 1600mg    omeprazole  (PRILOSEC) 20 MG capsule Take 1 capsule (20 mg total) by mouth daily.   rosuvastatin  (CRESTOR ) 20 MG tablet TAKE 1 TABLET BY MOUTH EVERY DAY   Semaglutide , 2 MG/DOSE, 8 MG/3ML SOPN Inject 2 mg as directed once a week.   sildenafil  (VIAGRA ) 100 MG tablet Take 1 tablet (100 mg total) by mouth daily as needed.   traZODone  (DESYREL ) 100 MG  tablet TAKE 1 TABLET BY MOUTH AT  BEDTIME   trolamine salicylate (ASPERCREME) 10 % cream Apply 1 application topically as needed (knee pain).   vitamin E 400 UNIT capsule Take 400 Units by mouth every evening.    FLUZONE HIGH-DOSE 0.5 ML injection    [DISCONTINUED] Apple Cider Vinegar 600 MG CAPS Take 600 mg by mouth daily. (Patient not taking: Reported on 05/01/2024)   [DISCONTINUED] Biotin w/ Vitamins C & E (HAIR SKIN & NAILS GUMMIES PO) Take 1 tablet by mouth every evening. (Patient not taking: Reported on 05/01/2024)   [DISCONTINUED] glucose blood (ONETOUCH ULTRA) test strip Use as instructed Check blood sugars on waking up 3 days a week   [DISCONTINUED] glucose blood test strip Use as instructed twice a day at different times   [DISCONTINUED] MM PEN NEEDLES 31G X 8 MM MISC AS DIRECTED WITH  VICTOZA    No facility-administered medications prior to visit.   "

## 2024-06-13 ENCOUNTER — Ambulatory Visit: Admitting: Podiatry

## 2024-08-28 ENCOUNTER — Ambulatory Visit

## 2024-08-29 ENCOUNTER — Ambulatory Visit: Admitting: "Endocrinology

## 2024-11-05 ENCOUNTER — Ambulatory Visit: Admitting: Family Medicine
